# Patient Record
Sex: Male | Born: 1935 | Race: White | Hispanic: No | Marital: Married | Smoking: Former smoker
Health system: Southern US, Community
[De-identification: ages and names within clinical notes are randomized; demographics above are authoritative.]

## PROBLEM LIST (undated history)

## (undated) DIAGNOSIS — R319 Hematuria, unspecified: Secondary | ICD-10-CM

## (undated) DIAGNOSIS — C801 Malignant (primary) neoplasm, unspecified: Secondary | ICD-10-CM

## (undated) DIAGNOSIS — Z87898 Personal history of other specified conditions: Secondary | ICD-10-CM

## (undated) DIAGNOSIS — Z8719 Personal history of other diseases of the digestive system: Secondary | ICD-10-CM

## (undated) DIAGNOSIS — N281 Cyst of kidney, acquired: Secondary | ICD-10-CM

## (undated) DIAGNOSIS — R3915 Urgency of urination: Secondary | ICD-10-CM

## (undated) DIAGNOSIS — I480 Paroxysmal atrial fibrillation: Secondary | ICD-10-CM

## (undated) DIAGNOSIS — Z95818 Presence of other cardiac implants and grafts: Secondary | ICD-10-CM

## (undated) DIAGNOSIS — C649 Malignant neoplasm of unspecified kidney, except renal pelvis: Secondary | ICD-10-CM

## (undated) DIAGNOSIS — I639 Cerebral infarction, unspecified: Secondary | ICD-10-CM

## (undated) DIAGNOSIS — Z9889 Other specified postprocedural states: Secondary | ICD-10-CM

## (undated) DIAGNOSIS — G629 Polyneuropathy, unspecified: Secondary | ICD-10-CM

## (undated) DIAGNOSIS — F329 Major depressive disorder, single episode, unspecified: Secondary | ICD-10-CM

## (undated) DIAGNOSIS — E860 Dehydration: Secondary | ICD-10-CM

## (undated) DIAGNOSIS — K5730 Diverticulosis of large intestine without perforation or abscess without bleeding: Secondary | ICD-10-CM

## (undated) DIAGNOSIS — R351 Nocturia: Secondary | ICD-10-CM

## (undated) DIAGNOSIS — Z8673 Personal history of transient ischemic attack (TIA), and cerebral infarction without residual deficits: Secondary | ICD-10-CM

## (undated) DIAGNOSIS — I10 Essential (primary) hypertension: Secondary | ICD-10-CM

## (undated) DIAGNOSIS — N529 Male erectile dysfunction, unspecified: Secondary | ICD-10-CM

## (undated) DIAGNOSIS — Z974 Presence of external hearing-aid: Secondary | ICD-10-CM

## (undated) DIAGNOSIS — E785 Hyperlipidemia, unspecified: Secondary | ICD-10-CM

## (undated) DIAGNOSIS — Z8619 Personal history of other infectious and parasitic diseases: Secondary | ICD-10-CM

## (undated) DIAGNOSIS — F32A Depression, unspecified: Secondary | ICD-10-CM

## (undated) DIAGNOSIS — F419 Anxiety disorder, unspecified: Secondary | ICD-10-CM

## (undated) DIAGNOSIS — N2889 Other specified disorders of kidney and ureter: Secondary | ICD-10-CM

## (undated) HISTORY — DX: Hyperlipidemia, unspecified: E78.5

## (undated) HISTORY — PX: WRIST FRACTURE SURGERY: SHX121

## (undated) HISTORY — DX: Anxiety disorder, unspecified: F41.9

## (undated) HISTORY — PX: ESOPHAGOGASTRODUODENOSCOPY: SHX1529

## (undated) HISTORY — DX: Polyneuropathy, unspecified: G62.9

## (undated) HISTORY — DX: Cerebral infarction, unspecified: I63.9

## (undated) HISTORY — PX: APPENDECTOMY: SHX54

## (undated) HISTORY — DX: Male erectile dysfunction, unspecified: N52.9

## (undated) HISTORY — DX: Malignant neoplasm of unspecified kidney, except renal pelvis: C64.9

## (undated) HISTORY — DX: Essential (primary) hypertension: I10

## (undated) HISTORY — PX: TRANSTHORACIC ECHOCARDIOGRAM: SHX275

## (undated) HISTORY — DX: Depression, unspecified: F32.A

## (undated) HISTORY — DX: Major depressive disorder, single episode, unspecified: F32.9

## (undated) HISTORY — PX: COLONOSCOPY: SHX174

---
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
## 2001-09-01 ENCOUNTER — Encounter: Admission: RE | Admit: 2001-09-01 | Discharge: 2001-09-01 | Payer: Self-pay | Admitting: Internal Medicine

## 2001-09-01 ENCOUNTER — Encounter: Payer: Self-pay | Admitting: Internal Medicine

## 2004-03-09 ENCOUNTER — Ambulatory Visit: Payer: Self-pay | Admitting: Internal Medicine

## 2005-05-24 ENCOUNTER — Ambulatory Visit: Payer: Self-pay | Admitting: Internal Medicine

## 2005-06-03 ENCOUNTER — Ambulatory Visit: Payer: Self-pay | Admitting: Internal Medicine

## 2005-06-21 ENCOUNTER — Ambulatory Visit: Payer: Self-pay | Admitting: Internal Medicine

## 2005-07-05 ENCOUNTER — Ambulatory Visit: Payer: Self-pay | Admitting: Internal Medicine

## 2006-09-23 ENCOUNTER — Ambulatory Visit: Payer: Self-pay | Admitting: Internal Medicine

## 2006-09-23 LAB — CONVERTED CEMR LAB
ALT: 25 units/L (ref 0–40)
AST: 21 units/L (ref 0–37)
Albumin: 3.8 g/dL (ref 3.5–5.2)
Alkaline Phosphatase: 63 units/L (ref 39–117)
BUN: 14 mg/dL (ref 6–23)
Basophils Absolute: 0 10*3/uL (ref 0.0–0.1)
Basophils Relative: 0.5 % (ref 0.0–1.0)
Bilirubin Urine: NEGATIVE
Bilirubin, Direct: 0.1 mg/dL (ref 0.0–0.3)
CO2: 32 meq/L (ref 19–32)
Calcium: 9.2 mg/dL (ref 8.4–10.5)
Chloride: 106 meq/L (ref 96–112)
Cholesterol: 158 mg/dL (ref 0–200)
Creatinine, Ser: 1.1 mg/dL (ref 0.4–1.5)
Eosinophils Absolute: 0.1 10*3/uL (ref 0.0–0.6)
Eosinophils Relative: 1.6 % (ref 0.0–5.0)
GFR calc Af Amer: 86 mL/min
GFR calc non Af Amer: 71 mL/min
Glucose, Bld: 100 mg/dL — ABNORMAL HIGH (ref 70–99)
HCT: 44.3 % (ref 39.0–52.0)
HDL: 29.7 mg/dL — ABNORMAL LOW (ref >39.0)
Hemoglobin, Urine: NEGATIVE
Hemoglobin: 15.5 g/dL (ref 13.0–17.0)
Ketones, ur: NEGATIVE mg/dL
LDL Cholesterol: 115 mg/dL — ABNORMAL HIGH (ref 0–99)
Leukocytes, UA: NEGATIVE
Lymphocytes Relative: 20.8 % (ref 12.0–46.0)
MCHC: 35 g/dL (ref 30.0–36.0)
MCV: 95.1 fL (ref 78.0–100.0)
Monocytes Absolute: 0.5 10*3/uL (ref 0.2–0.7)
Monocytes Relative: 11.7 % — ABNORMAL HIGH (ref 3.0–11.0)
Neutro Abs: 2.8 10*3/uL (ref 1.4–7.7)
Neutrophils Relative %: 65.4 % (ref 43.0–77.0)
Nitrite: NEGATIVE
PSA: 0.56 ng/mL (ref 0.10–4.00)
Platelets: 144 10*3/uL — ABNORMAL LOW (ref 150–400)
Potassium: 4.8 meq/L (ref 3.5–5.1)
RBC: 4.66 M/uL (ref 4.22–5.81)
RDW: 12 % (ref 11.5–14.6)
Sodium: 142 meq/L (ref 135–145)
Specific Gravity, Urine: 1.02 (ref 1.000–1.03)
TSH: 1.13 microintl units/mL (ref 0.35–5.50)
Total Bilirubin: 0.6 mg/dL (ref 0.3–1.2)
Total CHOL/HDL Ratio: 5.3
Total Protein, Urine: NEGATIVE mg/dL
Total Protein: 6.3 g/dL (ref 6.0–8.3)
Triglycerides: 67 mg/dL (ref 0–149)
Urine Glucose: NEGATIVE mg/dL
Urobilinogen, UA: 0.2 (ref 0.0–1.0)
VLDL: 13 mg/dL (ref 0–40)
WBC: 4.3 10*3/uL — ABNORMAL LOW (ref 4.5–10.5)
pH: 7 (ref 5.0–8.0)

## 2008-05-12 ENCOUNTER — Ambulatory Visit: Payer: Self-pay | Admitting: Internal Medicine

## 2008-05-12 DIAGNOSIS — E785 Hyperlipidemia, unspecified: Secondary | ICD-10-CM | POA: Insufficient documentation

## 2008-05-12 DIAGNOSIS — I10 Essential (primary) hypertension: Secondary | ICD-10-CM | POA: Insufficient documentation

## 2008-05-12 DIAGNOSIS — K5730 Diverticulosis of large intestine without perforation or abscess without bleeding: Secondary | ICD-10-CM | POA: Insufficient documentation

## 2008-05-12 DIAGNOSIS — N318 Other neuromuscular dysfunction of bladder: Secondary | ICD-10-CM | POA: Insufficient documentation

## 2008-05-12 DIAGNOSIS — F329 Major depressive disorder, single episode, unspecified: Secondary | ICD-10-CM | POA: Insufficient documentation

## 2008-05-12 DIAGNOSIS — K219 Gastro-esophageal reflux disease without esophagitis: Secondary | ICD-10-CM | POA: Insufficient documentation

## 2008-05-12 DIAGNOSIS — F3289 Other specified depressive episodes: Secondary | ICD-10-CM | POA: Insufficient documentation

## 2008-05-12 DIAGNOSIS — K279 Peptic ulcer, site unspecified, unspecified as acute or chronic, without hemorrhage or perforation: Secondary | ICD-10-CM | POA: Insufficient documentation

## 2008-05-12 DIAGNOSIS — F411 Generalized anxiety disorder: Secondary | ICD-10-CM | POA: Insufficient documentation

## 2008-05-12 LAB — HM COLONOSCOPY

## 2010-05-06 LAB — CONVERTED CEMR LAB
ALT: 20 units/L (ref 0–53)
AST: 21 units/L (ref 0–37)
Albumin: 4.3 g/dL (ref 3.5–5.2)
Alkaline Phosphatase: 63 units/L (ref 39–117)
BUN: 19 mg/dL (ref 6–23)
Basophils Absolute: 0 10*3/uL (ref 0.0–0.1)
Basophils Relative: 0.3 % (ref 0.0–3.0)
Bilirubin Urine: NEGATIVE
Bilirubin, Direct: 0.2 mg/dL (ref 0.0–0.3)
CO2: 31 meq/L (ref 19–32)
Calcium: 9.3 mg/dL (ref 8.4–10.5)
Chloride: 106 meq/L (ref 96–112)
Cholesterol: 159 mg/dL (ref 0–200)
Creatinine, Ser: 1.1 mg/dL (ref 0.4–1.5)
Eosinophils Absolute: 0.1 10*3/uL (ref 0.0–0.7)
Eosinophils Relative: 1 % (ref 0.0–5.0)
GFR calc Af Amer: 85 mL/min
GFR calc non Af Amer: 70 mL/min
Glucose, Bld: 100 mg/dL — ABNORMAL HIGH (ref 70–99)
HCT: 46.1 % (ref 39.0–52.0)
HDL: 31.8 mg/dL — ABNORMAL LOW (ref >39.0)
Hemoglobin, Urine: NEGATIVE
Hemoglobin: 16.1 g/dL (ref 13.0–17.0)
Ketones, ur: NEGATIVE mg/dL
LDL Cholesterol: 116 mg/dL — ABNORMAL HIGH (ref 0–99)
Leukocytes, UA: NEGATIVE
Lymphocytes Relative: 17.4 % (ref 12.0–46.0)
MCHC: 34.9 g/dL (ref 30.0–36.0)
MCV: 97.4 fL (ref 78.0–100.0)
Monocytes Absolute: 0.4 10*3/uL (ref 0.1–1.0)
Monocytes Relative: 8.5 % (ref 3.0–12.0)
Neutro Abs: 3.8 10*3/uL (ref 1.4–7.7)
Neutrophils Relative %: 72.8 % (ref 43.0–77.0)
Nitrite: NEGATIVE
PSA: 0.5 ng/mL (ref 0.10–4.00)
Platelets: 139 10*3/uL — ABNORMAL LOW (ref 150–400)
Potassium: 4.6 meq/L (ref 3.5–5.1)
RBC: 4.74 M/uL (ref 4.22–5.81)
RDW: 12.5 % (ref 11.5–14.6)
Sodium: 143 meq/L (ref 135–145)
Specific Gravity, Urine: 1.025 (ref 1.000–1.03)
TSH: 1.21 microintl units/mL (ref 0.35–5.50)
Total Bilirubin: 0.9 mg/dL (ref 0.3–1.2)
Total CHOL/HDL Ratio: 5
Total Protein, Urine: NEGATIVE mg/dL
Total Protein: 6.6 g/dL (ref 6.0–8.3)
Triglycerides: 55 mg/dL (ref 0–149)
Urine Glucose: NEGATIVE mg/dL
Urobilinogen, UA: 0.2 (ref 0.0–1.0)
VLDL: 11 mg/dL (ref 0–40)
WBC: 5.2 10*3/uL (ref 4.5–10.5)
pH: 5.5 (ref 5.0–8.0)

## 2010-05-10 NOTE — Assessment & Plan Note (Signed)
Summary: YEARLY--$50---STC   Vital Signs:  Patient Profile:   75 Years Old Male Weight:      213 pounds O2 Sat:      97 % O2 treatment:    Room Air Temp:     97.7 degrees F oral Pulse rate:   65 / minute BP sitting:   144 / 82  (left arm) Cuff size:   regular  Vitals Entered By: Payton Spark CMA (May 12, 2008 8:26 AM)                 Preventive Care Screening  Colonoscopy:    Date:  07/05/2005    Next Due:  07/2015    Results:  Diverticulosis   Last Pneumovax:    Date:  03/15/2004    Results:  given    Chief Complaint:  yearly follow up.  History of Present Illness: overall doing well, BP at home < 140/90, no specific complaints    Prior Medications Reviewed Using: Patient Recall  Updated Prior Medication List: OMEPRAZOLE 20 MG CPDR (OMEPRAZOLE) 1 by mouth once daily as needed ADULT ASPIRIN EC LOW STRENGTH 81 MG TBEC (ASPIRIN) 1p o once daily VESICARE 5 MG TABS (SOLIFENACIN SUCCINATE) 1 by mouth once daily  Current Allergies (reviewed today): No known allergies   Past Medical History:    Reviewed history and no changes required:       Hypertension       E.D.       Hyperlipidemia       DJD - left knee        mitral regurgitation       Peptic ulcer disease with duodenal and antral ulcers/ hx of tx for h pylori       Anxiety       Depression       hx of syncope 1997       Diverticulosis, colon       GERD  Past Surgical History:    Reviewed history and no changes required:       Appendectomy   Family History:    Reviewed history and no changes required:       brother with parkinson's, dementia       mother and brother with pacemakers  Social History:    Reviewed history and no changes required:       Married       2 children       Never Smoked       Alcohol use-no       retired - [employer]       son is Programmer, systems   Risk Factors:  Tobacco use:  never Alcohol use:  no  Colonoscopy History:     Date of Last  Colonoscopy:  07/05/2005    Results:  Diverticulosis    Review of Systems  The patient denies anorexia, fever, weight loss, weight gain, vision loss, decreased hearing, hoarseness, chest pain, syncope, dyspnea on exertion, peripheral edema, prolonged cough, headaches, hemoptysis, abdominal pain, melena, hematochezia, severe indigestion/heartburn, hematuria, incontinence, muscle weakness, suspicious skin lesions, transient blindness, difficulty walking, depression, unusual weight change, abnormal bleeding, enlarged lymph nodes, angioedema, and breast masses.         all otherwise negative except for some daily urgency often times leading to incontinence, but no specific symtpom o/w of prostatism such as hesitancy   Physical Exam  General:     alert and well-developed.   Head:     Normocephalic  and atraumatic without obvious abnormalities. No apparent alopecia or balding. Eyes:     No corneal or conjunctival inflammation noted. EOMI. Perrla. Ears:     External ear exam shows no significant lesions or deformities.  Otoscopic examination reveals clear canals, tympanic membranes are intact bilaterally without bulging, retraction, inflammation or discharge. Hearing is grossly normal bilaterally. Nose:     External nasal examination shows no deformity or inflammation. Nasal mucosa are pink and moist without lesions or exudates. Mouth:     Oral mucosa and oropharynx without lesions or exudates.  Teeth in good repair. Neck:     No deformities, masses, or tenderness noted. Lungs:     Normal respiratory effort, chest expands symmetrically. Lungs are clear to auscultation, no crackles or wheezes. Heart:     Normal rate and regular rhythm. S1 and S2 normal without gallop, murmur, click, rub or other extra sounds. Abdomen:     Bowel sounds positive,abdomen soft and non-tender without masses, organomegaly or hernias noted. Msk:     no joint tenderness and no joint swelling.   Extremities:     no  edema, no ulcers  Neurologic:     cranial nerves II-XII intact and strength normal in all extremities.      Impression & Recommendations:  Problem # 1:  Preventive Health Care (ICD-V70.0) Overall doing well, up to date, counseled on routine health concerns for screening and prevention, immunizations up to date or declined, labs ordered, ecg reviewed    Orders: EKG w/ Interpretation (93000) TLB-BMP (Basic Metabolic Panel-BMET) (80048-METABOL) TLB-CBC Platelet - w/Differential (85025-CBCD) TLB-Hepatic/Liver Function Pnl (80076-HEPATIC) TLB-Lipid Panel (80061-LIPID) TLB-PSA (Prostate Specific Antigen) (84153-PSA) TLB-TSH (Thyroid Stimulating Hormone) (84443-TSH) TLB-Udip ONLY (81003-UDIP)   Problem # 2:  OVERACTIVE BLADDER (ICD-596.51) to try the vesicare, check ua and psa with labs above, to Urology if not improved in 4 wks  Complete Medication List: 1)  Omeprazole 20 Mg Cpdr (Omeprazole) .[name].. 1 by mouth once daily as needed 2)  Adult Aspirin Ec Low Strength 81 Mg Tbec (Aspirin) .[name].. 1p o once daily 3)  Vesicare 5 Mg Tabs (Solifenacin succinate) .[name].. 1 by mouth once daily  Other Orders: Tdap => 25yrs IM (16109) Pneumococcal Vaccine (60454) Admin 1st Vaccine (09811) Admin of Any Addtl Vaccine (91478)   Patient Instructions: 1)  you received the tetanus and pneumonia shots today 2)  Please go to the Lab in the basement for your blood tests today 3)  start the vesicare 5 mg per day 4)  Continue all medications that you may have been taking previously  5)  please call in 4 wks if the urinary problem is not improved for a referral to urology 6)  Please schedule a follow-up appointment in 1 year or sooner if needed   Prescriptions: VESICARE 5 MG TABS (SOLIFENACIN SUCCINATE) 1 by mouth once daily  #90 x 3   Entered and Authorized by:   Corwin Levins MD   Signed by:   Corwin Levins MD on 05/12/2008   Method used:   Print then Give to Patient   RxID:   [id number]     Other Immunization History:    Zostavax # 1:  Zostavax (09/23/2006)     Tetanus/Td Vaccine    Vaccine Type: Tdap    Site: right deltoid    Mfr: GlaxoSmithKline    Dose: 0.5 ml    Route: IM    Given by: Payton Spark CMA    Exp. Date: 03/12/2010    Lot #:  [id number]    VIS given: 02/24/07 version given May 12, 2008.  Pneumovax Vaccine    Vaccine Type: Pneumovax    Site: right deltoid    Mfr: Merck    Dose: 0.5 ml    Route: IM    Given by: Payton Spark CMA    Exp. Date: 05/06/2009    Lot #: [id number]    VIS given: 11/04/95 version given May 12, 2008.

## 2010-07-27 ENCOUNTER — Ambulatory Visit (INDEPENDENT_AMBULATORY_CARE_PROVIDER_SITE_OTHER): Payer: Medicare Other | Admitting: Internal Medicine

## 2010-07-27 ENCOUNTER — Other Ambulatory Visit (INDEPENDENT_AMBULATORY_CARE_PROVIDER_SITE_OTHER): Payer: Medicare Other

## 2010-07-27 ENCOUNTER — Encounter: Payer: Self-pay | Admitting: Internal Medicine

## 2010-07-27 VITALS — BP 122/78 | HR 53 | Temp 97.8°F | Ht 72.0 in | Wt 207.1 lb

## 2010-07-27 DIAGNOSIS — Z0000 Encounter for general adult medical examination without abnormal findings: Secondary | ICD-10-CM

## 2010-07-27 DIAGNOSIS — E785 Hyperlipidemia, unspecified: Secondary | ICD-10-CM

## 2010-07-27 DIAGNOSIS — R42 Dizziness and giddiness: Secondary | ICD-10-CM | POA: Insufficient documentation

## 2010-07-27 DIAGNOSIS — Z125 Encounter for screening for malignant neoplasm of prostate: Secondary | ICD-10-CM

## 2010-07-27 DIAGNOSIS — I10 Essential (primary) hypertension: Secondary | ICD-10-CM

## 2010-07-27 LAB — URINALYSIS, ROUTINE W REFLEX MICROSCOPIC
Bilirubin Urine: NEGATIVE
Hgb urine dipstick: NEGATIVE
Ketones, ur: NEGATIVE
Leukocytes, UA: NEGATIVE
Nitrite: NEGATIVE
Specific Gravity, Urine: 1.02 (ref 1.000–1.030)
Total Protein, Urine: NEGATIVE
Urine Glucose: NEGATIVE
Urobilinogen, UA: 0.2 (ref 0.0–1.0)
pH: 5.5 (ref 5.0–8.0)

## 2010-07-27 LAB — BASIC METABOLIC PANEL
BUN: 20 mg/dL (ref 6–23)
CO2: 29 mEq/L (ref 19–32)
Calcium: 9.2 mg/dL (ref 8.4–10.5)
Chloride: 103 mEq/L (ref 96–112)
Creatinine, Ser: 1 mg/dL (ref 0.4–1.5)
GFR: 74.53 mL/min (ref >60.00)
Glucose, Bld: 79 mg/dL (ref 70–99)
Potassium: 4 mEq/L (ref 3.5–5.1)
Sodium: 140 mEq/L (ref 135–145)

## 2010-07-27 LAB — CBC WITH DIFFERENTIAL/PLATELET
Basophils Absolute: 0 10*3/uL (ref 0.0–0.1)
Basophils Relative: 0.5 % (ref 0.0–3.0)
Eosinophils Absolute: 0.1 10*3/uL (ref 0.0–0.7)
Eosinophils Relative: 1.4 % (ref 0.0–5.0)
HCT: 44.2 % (ref 39.0–52.0)
Hemoglobin: 15.3 g/dL (ref 13.0–17.0)
Lymphocytes Relative: 22.4 % (ref 12.0–46.0)
Lymphs Abs: 1 10*3/uL (ref 0.7–4.0)
MCHC: 34.8 g/dL (ref 30.0–36.0)
MCV: 98.3 fl (ref 78.0–100.0)
Monocytes Absolute: 0.6 10*3/uL (ref 0.1–1.0)
Monocytes Relative: 13.3 % — ABNORMAL HIGH (ref 3.0–12.0)
Neutro Abs: 2.7 10*3/uL (ref 1.4–7.7)
Neutrophils Relative %: 62.4 % (ref 43.0–77.0)
Platelets: 143 10*3/uL — ABNORMAL LOW (ref 150.0–400.0)
RBC: 4.49 Mil/uL (ref 4.22–5.81)
RDW: 12.7 % (ref 11.5–14.6)
WBC: 4.3 10*3/uL — ABNORMAL LOW (ref 4.5–10.5)

## 2010-07-27 LAB — HEPATIC FUNCTION PANEL
ALT: 22 U/L (ref 0–53)
AST: 23 U/L (ref 0–37)
Albumin: 4.2 g/dL (ref 3.5–5.2)
Alkaline Phosphatase: 59 U/L (ref 39–117)
Bilirubin, Direct: 0.1 mg/dL (ref 0.0–0.3)
Total Bilirubin: 1 mg/dL (ref 0.3–1.2)
Total Protein: 6.4 g/dL (ref 6.0–8.3)

## 2010-07-27 LAB — LIPID PANEL
Cholesterol: 154 mg/dL (ref 0–200)
HDL: 36.9 mg/dL — ABNORMAL LOW (ref >39.00)
LDL Cholesterol: 107 mg/dL — ABNORMAL HIGH (ref 0–99)
Total CHOL/HDL Ratio: 4
Triglycerides: 50 mg/dL (ref 0.0–149.0)
VLDL: 10 mg/dL (ref 0.0–40.0)

## 2010-07-27 LAB — PSA: PSA: 0.49 ng/mL (ref 0.10–4.00)

## 2010-07-27 LAB — TSH: TSH: 1.44 u[IU]/mL (ref 0.35–5.50)

## 2010-07-27 NOTE — Progress Notes (Signed)
Quick Note:  Voice message left on PhoneTree system - lab is negative, normal or otherwise stable, pt to continue same tx ______ 

## 2010-07-27 NOTE — Assessment & Plan Note (Signed)
stable overall by hx and exam, most recent lab reviewed with pt, and pt to continue medical treatment as before  BP Readings from Last 3 Encounters:  07/27/10 122/78  05/12/08 144/82

## 2010-07-27 NOTE — Progress Notes (Signed)
Here for wellness and f/u;  Overall doing ok;  Pt denies CP, worsening SOB, DOE, wheezing, orthopnea, PND, worsening LE edema, palpitations, dizziness or syncope.  Pt denies neurological change such as new Headache, facial or extremity weakness.  Pt denies polydipsia, polyuria, or low sugar symptoms. Pt states overall good compliance with treatment and medications, good tolerability, and trying to follow lower cholesterol diet.  Pt denies worsening depressive symptoms, suicidal ideation or panic. No fever, wt loss, night sweats, loss of appetite, or other constitutional symptoms.  Pt states good ability with ADL's, low fall risk, home safety reviewed and adequate, no significant changes in hearing or vision, and occasionally active with exercise.  Has not been here for 2 yrs, and now involvd in the "aspree" trial at [facility], and meds changed from last visit, now on no meds except for the study med, which may or may not have asa in it. Does ask for celebrex for joint aches prn, no recent swelling.  Past Medical History  Diagnosis Date  . Hypertension   . Erectile dysfunction   . Hyperlipidemia   . Anxiety   . Depression   . GERD (gastroesophageal reflux disease)   . Diverticula, colon   . Syncope     History  . Mitral regurgitation   . Peptic ulcer disease     with Duodenal and Antral ulcers  . H. pylori infection   . HYPERLIPIDEMIA 05/12/2008  . ANXIETY 05/12/2008  . DEPRESSION 05/12/2008  . HYPERTENSION 05/12/2008  . GERD 05/12/2008  . PEPTIC ULCER DISEASE 05/12/2008  . DIVERTICULOSIS, COLON 05/12/2008  . OVERACTIVE BLADDER 05/12/2008   Past Surgical History  Procedure Date  . Appendectomy     reports that he has never smoked. He does not have any smokeless tobacco history on file. He reports that he does not drink alcohol or use illicit drugs. family history includes Dementia in his brother and Parkinsonism in his brother. No Known Allergies Current Outpatient Prescriptions on File Prior to Visit    Medication Sig Dispense Refill  . omeprazole (PRILOSEC) 20 MG capsule Take 20 mg by mouth daily as needed.        [name] aspirin 81 MG EC tablet Take 81 mg by mouth daily.        . solifenacin (VESICARE) 5 MG tablet Take 10 mg by mouth daily.         Review of Systems  Constitutional: Negative for diaphoresis, activity change, appetite change and unexpected weight change.  HENT: Negative for hearing loss, ear pain, facial swelling, mouth sores and neck stiffness.   Eyes: Negative for pain, redness and visual disturbance.  Respiratory: Negative for shortness of breath and wheezing.   Cardiovascular: Negative for chest pain and palpitations.  Gastrointestinal: Negative for diarrhea, blood in stool, abdominal distention and rectal pain.  Genitourinary: Negative for hematuria, flank pain and decreased urine volume.  Musculoskeletal: Negative for myalgias and joint swelling.  Skin: Negative for color change and wound.  Neurological: Negative for syncope and numbness.  Hematological: Negative for adenopathy.  Psychiatric/Behavioral: Negative for hallucinations, self-injury, decreased concentration and agitation.  Did have one dizzy spell last wk but few seconds only and he thinks related to a URI he was trying to get over.  BP 122/78  Pulse 53  Temp(Src) 97.8 F (36.6 C) (Oral)  Ht 6' (1.829 m)  Wt 207 lb 2 oz (93.951 kg)  BMI 28.09 kg/m2  SpO2 98% Physical Exam  VS noted Constitutional: Pt is oriented to  person, place, and time. Appears well-developed and well-nourished.  HENT:  Head: Normocephalic and atraumatic.  Right Ear: External ear normal.  Left Ear: External ear normal.  Nose: Nose normal.  Mouth/Throat: Oropharynx is clear and moist.  Eyes: Conjunctivae and EOM are normal. Pupils are equal, round, and reactive to light.  Neck: Normal range of motion. Neck supple. No JVD present. No tracheal deviation present.  Cardiovascular: Normal rate, regular rhythm, normal heart sounds and  intact distal pulses.   Pulmonary/Chest: Effort normal and breath sounds normal.  Abdominal: Soft. Bowel sounds are normal. There is no tenderness.  Musculoskeletal: Normal range of motion. Exhibits no edema.  Lymphadenopathy:  Has no cervical adenopathy.  Neurological: Pt is alert and oriented to person, place, and time. Pt has normal reflexes. No cranial nerve deficit.  Skin: Skin is warm and dry. No rash noted.  Psychiatric:  Has  normal mood and affect. Behavior is normal.

## 2010-07-27 NOTE — Assessment & Plan Note (Signed)

## 2010-07-27 NOTE — Patient Instructions (Signed)
Take all new medications as prescribed Continue all other medications as before Please go to LAB in the Basement for the blood and/or urine tests to be done today You will be contacted regarding the referral for: echocardiogram, carotid dopplers Please call the number on the Blue Card (the PhoneTree System) for results of testing in 2-3 days Please return in 1 year for your yearly visit, or sooner if needed, with Lab testing done 3-5 days before

## 2010-07-27 NOTE — Assessment & Plan Note (Addendum)
Unclear etiology, exam unrevealing, ecg reviewed - sinus, no acute, for echo and carotids,  to f/u any worsening symptoms or concerns

## 2010-08-06 ENCOUNTER — Other Ambulatory Visit (HOSPITAL_COMMUNITY): Payer: Medicare Other | Admitting: Radiology

## 2010-08-07 ENCOUNTER — Encounter (INDEPENDENT_AMBULATORY_CARE_PROVIDER_SITE_OTHER): Payer: Medicare Other | Admitting: *Deleted

## 2010-08-07 ENCOUNTER — Ambulatory Visit (HOSPITAL_COMMUNITY): Payer: Medicare Other | Attending: Internal Medicine | Admitting: Radiology

## 2010-08-07 ENCOUNTER — Other Ambulatory Visit: Payer: Self-pay | Admitting: Internal Medicine

## 2010-08-07 DIAGNOSIS — R42 Dizziness and giddiness: Secondary | ICD-10-CM

## 2010-08-07 DIAGNOSIS — I6529 Occlusion and stenosis of unspecified carotid artery: Secondary | ICD-10-CM

## 2010-08-07 DIAGNOSIS — R55 Syncope and collapse: Secondary | ICD-10-CM

## 2010-08-07 DIAGNOSIS — I059 Rheumatic mitral valve disease, unspecified: Secondary | ICD-10-CM

## 2010-08-14 ENCOUNTER — Encounter: Payer: Self-pay | Admitting: Internal Medicine

## 2010-08-24 ENCOUNTER — Encounter: Payer: Self-pay | Admitting: Internal Medicine

## 2010-08-24 ENCOUNTER — Other Ambulatory Visit: Payer: Self-pay | Admitting: Internal Medicine

## 2010-08-24 DIAGNOSIS — M1712 Unilateral primary osteoarthritis, left knee: Secondary | ICD-10-CM | POA: Insufficient documentation

## 2010-08-24 MED ORDER — CELECOXIB 200 MG PO CAPS: 200.0000 mg | ORAL_CAPSULE | Freq: Two times a day (BID) | ORAL | Status: AC

## 2010-08-24 NOTE — Telephone Encounter (Signed)
[name] is at the pharmacy apparently expecting a Rx for Celebrex.? Stating that the pharmacy is faxing over requests. I can see where this has been denied and is not on Pt's med list.?? Not sure how you want to handle this.

## 2010-08-24 NOTE — Telephone Encounter (Signed)
Done per emr for left knee djd

## 2010-08-24 NOTE — Telephone Encounter (Signed)
LMOM to inform Pt that Rx was sent in to [facility].

## 2011-12-16 ENCOUNTER — Inpatient Hospital Stay (HOSPITAL_COMMUNITY): Payer: Medicare Other

## 2011-12-16 ENCOUNTER — Inpatient Hospital Stay (HOSPITAL_BASED_OUTPATIENT_CLINIC_OR_DEPARTMENT_OTHER)
Admission: EM | Admit: 2011-12-16 | Discharge: 2011-12-19 | DRG: 065 | Disposition: A | Discharge status: Home Health | Payer: Medicare Other | Attending: Internal Medicine | Admitting: Internal Medicine

## 2011-12-16 ENCOUNTER — Telehealth: Payer: Self-pay | Admitting: Physician Assistant

## 2011-12-16 ENCOUNTER — Emergency Department (HOSPITAL_BASED_OUTPATIENT_CLINIC_OR_DEPARTMENT_OTHER): Payer: Medicare Other

## 2011-12-16 ENCOUNTER — Encounter (HOSPITAL_BASED_OUTPATIENT_CLINIC_OR_DEPARTMENT_OTHER): Payer: Self-pay | Admitting: *Deleted

## 2011-12-16 DIAGNOSIS — K219 Gastro-esophageal reflux disease without esophagitis: Secondary | ICD-10-CM | POA: Diagnosis present

## 2011-12-16 DIAGNOSIS — F3289 Other specified depressive episodes: Secondary | ICD-10-CM | POA: Diagnosis present

## 2011-12-16 DIAGNOSIS — F329 Major depressive disorder, single episode, unspecified: Secondary | ICD-10-CM | POA: Diagnosis present

## 2011-12-16 DIAGNOSIS — Z8673 Personal history of transient ischemic attack (TIA), and cerebral infarction without residual deficits: Secondary | ICD-10-CM | POA: Diagnosis present

## 2011-12-16 DIAGNOSIS — R030 Elevated blood-pressure reading, without diagnosis of hypertension: Secondary | ICD-10-CM | POA: Diagnosis present

## 2011-12-16 DIAGNOSIS — I059 Rheumatic mitral valve disease, unspecified: Secondary | ICD-10-CM | POA: Diagnosis present

## 2011-12-16 DIAGNOSIS — I639 Cerebral infarction, unspecified: Secondary | ICD-10-CM

## 2011-12-16 DIAGNOSIS — K7689 Other specified diseases of liver: Secondary | ICD-10-CM | POA: Diagnosis present

## 2011-12-16 DIAGNOSIS — F411 Generalized anxiety disorder: Secondary | ICD-10-CM | POA: Diagnosis present

## 2011-12-16 DIAGNOSIS — E785 Hyperlipidemia, unspecified: Secondary | ICD-10-CM | POA: Diagnosis present

## 2011-12-16 DIAGNOSIS — I6350 Cerebral infarction due to unspecified occlusion or stenosis of unspecified cerebral artery: Secondary | ICD-10-CM

## 2011-12-16 DIAGNOSIS — I6340 Cerebral infarction due to embolism of unspecified cerebral artery: Principal | ICD-10-CM | POA: Diagnosis present

## 2011-12-16 DIAGNOSIS — G8190 Hemiplegia, unspecified affecting unspecified side: Secondary | ICD-10-CM | POA: Diagnosis present

## 2011-12-16 LAB — COMPREHENSIVE METABOLIC PANEL
ALT: 18 U/L (ref 0–53)
AST: 19 U/L (ref 0–37)
Albumin: 4.2 g/dL (ref 3.5–5.2)
Alkaline Phosphatase: 63 U/L (ref 39–117)
BUN: 20 mg/dL (ref 6–23)
CO2: 29 mEq/L (ref 19–32)
Calcium: 9.6 mg/dL (ref 8.4–10.5)
Chloride: 101 mEq/L (ref 96–112)
Creatinine, Ser: 1.2 mg/dL (ref 0.50–1.35)
GFR calc Af Amer: 67 mL/min — ABNORMAL LOW (ref >90)
GFR calc non Af Amer: 58 mL/min — ABNORMAL LOW (ref >90)
Glucose, Bld: 99 mg/dL (ref 70–99)
Potassium: 4.3 mEq/L (ref 3.5–5.1)
Sodium: 139 mEq/L (ref 135–145)
Total Bilirubin: 0.5 mg/dL (ref 0.3–1.2)
Total Protein: 6.7 g/dL (ref 6.0–8.3)

## 2011-12-16 LAB — RAPID URINE DRUG SCREEN, HOSP PERFORMED
Amphetamines: NOT DETECTED
Barbiturates: NOT DETECTED
Benzodiazepines: NOT DETECTED
Cocaine: NOT DETECTED
Opiates: NOT DETECTED
Tetrahydrocannabinol: NOT DETECTED

## 2011-12-16 LAB — DIFFERENTIAL
Basophils Absolute: 0 10*3/uL (ref 0.0–0.1)
Basophils Relative: 0 % (ref 0–1)
Eosinophils Absolute: 0.1 10*3/uL (ref 0.0–0.7)
Eosinophils Relative: 1 % (ref 0–5)
Lymphocytes Relative: 22 % (ref 12–46)
Lymphs Abs: 1.1 10*3/uL (ref 0.7–4.0)
Monocytes Absolute: 0.7 10*3/uL (ref 0.1–1.0)
Monocytes Relative: 14 % — ABNORMAL HIGH (ref 3–12)
Neutro Abs: 3.1 10*3/uL (ref 1.7–7.7)
Neutrophils Relative %: 63 % (ref 43–77)

## 2011-12-16 LAB — CREATININE, SERUM
Creatinine, Ser: 1.11 mg/dL (ref 0.50–1.35)
GFR calc Af Amer: 74 mL/min — ABNORMAL LOW (ref >90)
GFR calc non Af Amer: 64 mL/min — ABNORMAL LOW (ref >90)

## 2011-12-16 LAB — CBC
HCT: 41.6 % (ref 39.0–52.0)
HCT: 43.7 % (ref 39.0–52.0)
Hemoglobin: 14.5 g/dL (ref 13.0–17.0)
Hemoglobin: 15.1 g/dL (ref 13.0–17.0)
MCH: 32.7 pg (ref 26.0–34.0)
MCH: 32.8 pg (ref 26.0–34.0)
MCHC: 34.9 g/dL (ref 30.0–36.0)
MCHC: 34.6 g/dL (ref 30.0–36.0)
MCV: 93.9 fL (ref 78.0–100.0)
MCV: 94.8 fL (ref 78.0–100.0)
Platelets: 134 10*3/uL — ABNORMAL LOW (ref 150–400)
Platelets: 138 10*3/uL — ABNORMAL LOW (ref 150–400)
RBC: 4.43 MIL/uL (ref 4.22–5.81)
RBC: 4.61 MIL/uL (ref 4.22–5.81)
RDW: 12.2 % (ref 11.5–15.5)
RDW: 12.5 % (ref 11.5–15.5)
WBC: 5 10*3/uL (ref 4.0–10.5)
WBC: 5.8 10*3/uL (ref 4.0–10.5)

## 2011-12-16 LAB — TROPONIN I: Troponin I: <0.3 ng/mL (ref <0.30)

## 2011-12-16 LAB — GLUCOSE, CAPILLARY: Glucose-Capillary: 106 mg/dL — ABNORMAL HIGH (ref 70–99)

## 2011-12-16 LAB — CK TOTAL AND CKMB (NOT AT ARMC)
CK, MB: 3.3 ng/mL (ref 0.3–4.0)
Relative Index: INVALID (ref 0.0–2.5)
Total CK: 87 U/L (ref 7–232)

## 2011-12-16 LAB — PROTIME-INR
INR: 0.93 (ref 0.00–1.49)
Prothrombin Time: 12.7 seconds (ref 11.6–15.2)

## 2011-12-16 LAB — APTT: aPTT: 31 seconds (ref 24–37)

## 2011-12-16 MED ORDER — ASPIRIN 300 MG RE SUPP
300.0000 mg | Freq: Every day | RECTAL | Status: DC
  Filled 2011-12-16 (×3): qty 1

## 2011-12-16 MED ORDER — SODIUM CHLORIDE 0.9 % IJ SOLN
3.0000 mL | Freq: Two times a day (BID) | INTRAMUSCULAR | Status: DC
  Administered 2011-12-16 – 2011-12-19 (×6): 3 mL via INTRAVENOUS

## 2011-12-16 MED ORDER — ENOXAPARIN SODIUM 40 MG/0.4ML ~~LOC~~ SOLN
40.0000 mg | Freq: Every day | SUBCUTANEOUS | Status: DC
  Administered 2011-12-16 – 2011-12-18 (×3): 40 mg via SUBCUTANEOUS
  Filled 2011-12-16 (×4): qty 0.4

## 2011-12-16 MED ORDER — ACETAMINOPHEN 325 MG PO TABS: 650.0000 mg | ORAL_TABLET | ORAL | Status: DC | PRN

## 2011-12-16 MED ORDER — SODIUM CHLORIDE 0.9 % IV SOLN
250.0000 mL | INTRAVENOUS | Status: DC | PRN
  Administered 2011-12-19: 500 mL via INTRAVENOUS

## 2011-12-16 MED ORDER — ASPIRIN 325 MG PO TABS
325.0000 mg | ORAL_TABLET | Freq: Every day | ORAL | Status: DC
  Administered 2011-12-16 – 2011-12-17 (×2): 325 mg via ORAL
  Filled 2011-12-16 (×3): qty 1

## 2011-12-16 MED ORDER — ACETAMINOPHEN 650 MG RE SUPP: 650.0000 mg | RECTAL | Status: DC | PRN

## 2011-12-16 MED ORDER — SENNOSIDES-DOCUSATE SODIUM 8.6-50 MG PO TABS
1.0000 | ORAL_TABLET | Freq: Every evening | ORAL | Status: DC | PRN
  Administered 2011-12-18: 1 via ORAL
  Filled 2011-12-16: qty 1

## 2011-12-16 MED ORDER — PANTOPRAZOLE SODIUM 40 MG PO TBEC
40.0000 mg | DELAYED_RELEASE_TABLET | Freq: Every day | ORAL | Status: DC
  Administered 2011-12-17 – 2011-12-19 (×3): 40 mg via ORAL
  Filled 2011-12-16 (×3): qty 1

## 2011-12-16 MED ORDER — SODIUM CHLORIDE 0.9 % IJ SOLN: 3.0000 mL | INTRAMUSCULAR | Status: DC | PRN

## 2011-12-16 NOTE — ED Provider Notes (Signed)
History     CSN: [id number]  Arrival date & time 12/16/11  0900   First MD Initiated Contact with Patient 12/16/11 0940      Chief Complaint  Patient presents with  . Extremity Weakness    (Consider location/radiation/quality/duration/timing/severity/associated sxs/prior treatment) HPI Patient complaining of weakness and numbness in his left lower extremity. He was in his normal state of health when he awoke at 06 30. The symptoms began about 7 AM. He has had difficulty walking. He denies any headache, changes in vision, changes in speech or ability to talk. He has not had similar symptoms in the past. He arrived here via private vehicle. His wife is present with him. He has a history of hypertension and high cholesterol. Past Medical History  Diagnosis Date  . Hypertension   . Erectile dysfunction   . Hyperlipidemia   . Anxiety   . Depression   . GERD (gastroesophageal reflux disease)   . Diverticula, colon   . Syncope     History  . Mitral regurgitation   . Peptic ulcer disease     with Duodenal and Antral ulcers  . H. pylori infection   . HYPERLIPIDEMIA 05/12/2008  . ANXIETY 05/12/2008  . DEPRESSION 05/12/2008  . HYPERTENSION 05/12/2008  . GERD 05/12/2008  . PEPTIC ULCER DISEASE 05/12/2008  . DIVERTICULOSIS, COLON 05/12/2008  . OVERACTIVE BLADDER 05/12/2008    Past Surgical History  Procedure Date  . Appendectomy     Family History  Problem Relation Age of Onset  . Parkinsonism Brother   . Dementia Brother     History  Substance Use Topics  . Smoking status: Never Smoker   . Smokeless tobacco: Not on file  . Alcohol Use: No      Review of Systems  Constitutional: Negative for fever and chills.  HENT: Negative for neck stiffness.   Eyes: Negative for visual disturbance.  Respiratory: Negative for shortness of breath.   Cardiovascular: Negative for chest pain.  Gastrointestinal: Negative for vomiting, diarrhea and blood in stool.  Genitourinary: Negative for  dysuria, frequency and decreased urine volume.  Musculoskeletal: Negative for myalgias and joint swelling.  Skin: Negative for rash.  Neurological: Positive for weakness and numbness.  Hematological: Negative for adenopathy.  Psychiatric/Behavioral: Negative for agitation.    Allergies  Review of patient's allergies indicates no known allergies.  Home Medications   Current Outpatient Rx  Name Route Sig Dispense Refill  . ASPIRIN 81 MG PO TBEC Oral Take 81 mg by mouth daily.      [name] OMEPRAZOLE 20 MG PO CPDR Oral Take 20 mg by mouth daily as needed.        BP 146/98  Pulse 64  Temp 98.1 F (36.7 C) (Oral)  Resp 18  Ht 6\' 2"  (1.88 m)  Wt 215 lb (97.523 kg)  BMI 27.60 kg/m2  SpO2 100%  Physical Exam  Nursing note and vitals reviewed. Constitutional: He is oriented to person, place, and time. He appears well-developed and well-nourished.  HENT:  Head: Normocephalic and atraumatic.  Right Ear: External ear normal.  Left Ear: External ear normal.  Nose: Nose normal.  Mouth/Throat: Oropharynx is clear and moist.  Eyes: Conjunctivae and EOM are normal. Pupils are equal, round, and reactive to light.  Neck: Normal range of motion. Neck supple.  Cardiovascular: Normal rate, regular rhythm, normal heart sounds and intact distal pulses.   Pulmonary/Chest: Effort normal and breath sounds normal. No respiratory distress. He has no wheezes. He exhibits no  tenderness.  Abdominal: Soft. Bowel sounds are normal. He exhibits no distension and no mass. There is no tenderness. There is no guarding.  Musculoskeletal: Normal range of motion.  Neurological: He is alert and oriented to person, place, and time. He has normal strength. He exhibits normal muscle tone. Coordination abnormal. GCS eye subscore is 4. GCS verbal subscore is 5. GCS motor subscore is 6.       Left lower extremity- hip flexors 4/5, knee extensors, 5/5, knee flexor 5/5  Patient unable to sit up without assistance and finger  to nose unable.  Gait not tested due to balance problem.   Skin: Skin is warm and dry.  Psychiatric: He has a normal mood and affect. His behavior is normal. Judgment and thought content normal.    ED Course  Procedures (including critical care time)   Labs Reviewed  PROTIME-INR  APTT  CBC  DIFFERENTIAL  COMPREHENSIVE METABOLIC PANEL  CK TOTAL AND CKMB  TROPONIN I  URINE RAPID DRUG SCREEN (HOSP PERFORMED)   No results found.   No diagnosis found.  Ct Head Wo Contrast  12/16/2011  *RADIOLOGY REPORT*  Clinical Data: Left leg weakness.  Left leg and foot numbness.  CT HEAD WITHOUT CONTRAST  Technique:  Contiguous axial images were obtained from the base of the skull through the vertex without contrast.  Comparison: None.  Findings: The brain shows generalized atrophy.  There is extensive chronic small vessel disease throughout the brain.  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  There are old small vessel changes affecting the pons.  The cerebral hemispheres show confluent chronic small vessel disease throughout the white matter.  Old small vessel insults effect the thalami and basal ganglia.  No old or acute cortical or large vessel territory infarction is evident.  With respect to the white matter disease, an acute or subacute small vessel infarction could be hidden amongst the extensive chronic changes.  No evidence of mass lesion, hemorrhage, hydrocephalus or extra-axial collection.  No calvarial abnormality.  No inflammatory sinus disease.  There is atherosclerotic calcification of the major vessels at the base of the brain.  IMPRESSION: No identifiably acute finding.  No intracranial hemorrhage. Advanced chronic atrophy and small vessel disease throughout the brain.   Original Report Authenticated By: Thomasenia Sales, M.D.    Date: 12/16/2011  Rate: 63  Rhythm: normal sinus rhythm  QRS Axis: normal  Intervals: normal  ST/T Wave abnormalities: normal   Conduction Disutrbances: none  Narrative Interpretation: unremarkable      MDM    Code stroke initiated and Dr. Petra Kuba responded. NIH scale is 2 and not felt to be candidate for TPA and code stroke canceled per Dr. Toma Copier. He states that he will see the patient in consultation on transfer to [facility]. We have requested that the hospitalist be paged for admission for the patient appear   CRITICAL CARE Performed by: Glenette Bookwalter S   Total critical care time: 45 Critical care time was exclusive of separately billable procedures and treating other patients.  Critical care was necessary to treat or prevent imminent or life-threatening deterioration.  Critical care was time spent personally by me on the following activities: development of treatment plan with patient and/or surrogate as well as nursing, discussions with consultants, evaluation of patient's response to treatment, examination of patient, obtaining history from patient or surrogate, ordering and performing treatments and interventions, ordering and review of laboratory studies, ordering and review of radiographic studies, pulse  oximetry and re-evaluation of patient's condition.   Hilario Quarry, MD 12/18/11 1714

## 2011-12-16 NOTE — ED Notes (Signed)
Called 911 and carellink

## 2011-12-16 NOTE — H&P (Signed)
Hospital Admission Note Date: 12/16/2011  Patient name: Jeffrey Hardy Medical record number: [id number] Date of birth: [birth year] Age: 76 y.o. Gender: male PCP: Oliver Barre, MD  Attending physician: Altha Harm, MD  Chief Complaint: Pain limited weakness in left lower extremity this morning  History of Present Illness: This is a very pleasant 76 year old gentleman who has no significant past medical history who presented to the emergency room and he will this morning and had a feeling of heaviness and numbness in the left lower extremity. Patient states that he is also unable to move his leg up until the time that he got to the emergency room. At this time he states that in the supine position his leg feels almost back to normal however he has not tested his condition in a standing position. He denies any other symptoms including chest pain, dizziness, shortness of breath, cough, nausea, vomiting,, diarrhea, fever or chills.  Of note the patient is a 3 year interval he of Aspree study which is a double blinded study using placebo and aspirin 100mg . the study and a severe has been notified of the need for appropriate antiplatelet therapy given the patient's clinical presentation.  Scheduled Meds:   Continuous Infusions:   PRN Meds:.   Allergies: Review of patient's allergies indicates no known allergies. Past Medical History  Diagnosis Date  . Hypertension   . Erectile dysfunction   . Hyperlipidemia   . Anxiety   . Depression   . GERD (gastroesophageal reflux disease)   . Diverticula, colon   . Syncope     History  . Mitral regurgitation   . Peptic ulcer disease     with Duodenal and Antral ulcers  . H. pylori infection   . HYPERLIPIDEMIA 05/12/2008  . ANXIETY 05/12/2008  . DEPRESSION 05/12/2008  . HYPERTENSION 05/12/2008  . GERD 05/12/2008  . PEPTIC ULCER DISEASE 05/12/2008  . DIVERTICULOSIS, COLON 05/12/2008  . OVERACTIVE BLADDER 05/12/2008   Past Surgical History  Procedure  Date  . Appendectomy    Family History  Problem Relation Age of Onset  . Parkinsonism Brother   . Dementia Brother    History   Social History  . Marital Status: Single    Spouse Name: N/A    Number of Children: N/A  . Years of Education: N/A   Occupational History  . Not on file.   Social History Main Topics  . Smoking status: Never Smoker   . Smokeless tobacco: Not on file  . Alcohol Use: No  . Drug Use: No  . Sexually Active:    Other Topics Concern  . Not on file   Social History Narrative   Pt's son is an Teacher, English as a foreign language. Patient is retired from [employer]   Review of Systems: A comprehensive review of systems was negative except as noted in the history of present illness. Physical Exam: No intake or output data in the 24 hours ending 12/16/11 1430 General: Alert, awake, oriented x3, in no acute distress.  HEENT: Elmo/AT PEERL, EOMI Neck: Trachea midline,  no masses, no thyromegal,y no JVD, no carotid bruit OROPHARYNX:  Moist, No exudate/ erythema/lesions.  Heart: Regular rate and rhythm, without murmurs, rubs, gallops, PMI non-displaced, no heaves or thrills on palpation.  Lungs: Clear to auscultation, no wheezing or rhonchi noted. No increased vocal fremitus resonant to percussion  Abdomen: Soft, nontender, nondistended, positive bowel sounds, no masses no hepatosplenomegaly noted..  Neuro: Cranial nerves II through XII grossly intact. DTRs 2+ bilaterally upper and  lower extremities. Strength 5/5 in bilateral upper extremities and right lower extremity. Strength 3/5 and left lower extremity.. Musculoskeletal: No warm swelling or erythema around joints, no spinal tenderness noted. Psychiatric: Patient alert and oriented x3, good insight and cognition, good recent to remote recall.   Lab results:  [facility] 12/16/11 1015  NA 139  K 4.3  CL 101  CO2 29  GLUCOSE 99  BUN 20  CREATININE 1.20  CALCIUM 9.6  MG --  PHOS --    Basename 12/16/11 1015  AST 19    ALT 18  ALKPHOS 63  BILITOT 0.5  PROT 6.7  ALBUMIN 4.2   No results found for this basename: LIPASE:2,AMYLASE:2 in the last 72 hours  Basename 12/16/11 1015  WBC 5.0  NEUTROABS 3.1  HGB 14.5  HCT 41.6  MCV 93.9  PLT 134*    Basename 12/16/11 1015  CKTOTAL 87  CKMB 3.3  CKMBINDEX --  TROPONINI <0.30   No components found with this basename: POCBNP:3 No results found for this basename: DDIMER:2 in the last 72 hours No results found for this basename: HGBA1C:2 in the last 72 hours No results found for this basename: CHOL:2,HDL:2,LDLCALC:2,TRIG:2,CHOLHDL:2,LDLDIRECT:2 in the last 72 hours No results found for this basename: TSH,T4TOTAL,FREET3,T3FREE,THYROIDAB in the last 72 hours No results found for this basename: VITAMINB12:2,FOLATE:2,FERRITIN:2,TIBC:2,IRON:2,RETICCTPCT:2 in the last 72 hours Imaging results:  Ct Head Wo Contrast  12/16/2011  *RADIOLOGY REPORT*  Clinical Data: Left leg weakness.  Left leg and foot numbness.  CT HEAD WITHOUT CONTRAST  Technique:  Contiguous axial images were obtained from the base of the skull through the vertex without contrast.  Comparison: None.  Findings: The brain shows generalized atrophy.  There is extensive chronic small vessel disease throughout the brain.  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  There are old small vessel changes affecting the pons.  The cerebral hemispheres show confluent chronic small vessel disease throughout the white matter.  Old small vessel insults effect the thalami and basal ganglia.  No old or acute cortical or large vessel territory infarction is evident.  With respect to the white matter disease, an acute or subacute small vessel infarction could be hidden amongst the extensive chronic changes.  No evidence of mass lesion, hemorrhage, hydrocephalus or extra-axial collection.  No calvarial abnormality.  No inflammatory sinus disease.  There is atherosclerotic calcification of the major  vessels at the base of the brain.  IMPRESSION: No identifiably acute finding.  No intracranial hemorrhage. Advanced chronic atrophy and small vessel disease throughout the brain.   Original Report Authenticated By: Thomasenia Sales, M.D.    Other results: EKG: normal EKG,normal sinus rhythm.   Patient Active Hospital Problem List: No active hospital problems. TIA versus CVA: Patient will be admitted and put on antiplatelet therapy with aspirin 325 mg by mouth daily. He'll also have an MRI/MRA ordered as well as carotid duplex and 2-D echocardiogram. We'll check a lipid panel hemoglobin A1c. Neurology is on consult and will also see the patient in consultation.  DVT prophylaxis with Lovenox.  Celenia Hruska A. 12/16/2011, 2:30 PM

## 2011-12-16 NOTE — Progress Notes (Signed)
VASCULAR LAB PRELIMINARY  PRELIMINARY  PRELIMINARY  PRELIMINARY  Carotid duplex  completed.    Preliminary report:  Bilateral:  No evidence of hemodynamically significant internal carotid artery stenosis.   Vertebral artery flow is antegrade.     Drisana Schweickert, RVT 12/16/2011, 4:49 PM

## 2011-12-16 NOTE — Telephone Encounter (Signed)
[facility] Phone Call with Dr. Rosalia Hammers of [facility]  Jeffrey Hardy, is a 76 y.o. male,   MRN: [id number]  -  DOB - 1935-08-26  Outpatient Primary MD for the patient is Oliver Barre, MD   @V @  Active Problems:  * No active hospital problems. *    76 yo male with history of HTN and HLD who is part of an aspirin study (he is uncertain if he's taking aspirin or not), developed Left foot numbness this morning at 7am.  The numbness ascended into his left hip.  CT Head is negative for acute changes.  Glucose is wnl.  Per EDP cbc is wnl and bmet is pending.  She spoke to Dr. Petra Kuba of neurology who agreed to see the patient in consultation at [facility].  I have requested a neuro tele bed.     Algis Downs, PA-C [facility] Pager: [phone]

## 2011-12-16 NOTE — ED Notes (Signed)
Patient states he woke up this morning at 0630 am and felt normal.  States at 0700 am he developed sudden numbness in his left foot which has progressed up to his knee.  Now has weakness in his left leg and numbness from his left knee to the foot.

## 2011-12-16 NOTE — Consult Note (Signed)
[facility] CONSULT [facility]   Reason for Consult: transient left leg weakness   HPI:   Jeffrey Hardy is an 76 y.o. male who currently is enrolled in the "aspree" trial at [facility]. Patient awoke this AM at his baseline.  As he was sitting in the kitchen at 7 AM he went to stand up and noted his left foot had decreased sensation and he had difficulty moving his left leg.  He went about his normal activities until his wife noted he was walking funny and brought him to the hospital.  Over time, his left leg symptoms have improved but he still feels his strength has not fully returned to baseline. CT head was negative for acute stroke.   He denies back pain.   Past Medical History  Diagnosis Date  . Hypertension   . Erectile dysfunction   . Hyperlipidemia   . Anxiety   . Depression   . GERD (gastroesophageal reflux disease)   . Diverticula, colon   . Syncope     History  . Mitral regurgitation   . Peptic ulcer disease     with Duodenal and Antral ulcers  . H. pylori infection   . HYPERLIPIDEMIA 05/12/2008  . ANXIETY 05/12/2008  . DEPRESSION 05/12/2008  . HYPERTENSION 05/12/2008  . GERD 05/12/2008  . PEPTIC ULCER DISEASE 05/12/2008  . DIVERTICULOSIS, COLON 05/12/2008  . OVERACTIVE BLADDER 05/12/2008    Past Surgical History  Procedure Date  . Appendectomy     Family History  Problem Relation Age of Onset  . Parkinsonism Brother   . Dementia Brother     Social History:  reports that he has never smoked. He does not have any smokeless tobacco history on file. He reports that he does not drink alcohol or use illicit drugs.  No Known Allergies  Medications:    Prior to Admission:  Prescriptions prior to admission  Medication Sig Dispense Refill  . "aspree" trial       . omeprazole (PRILOSEC) 20 MG capsule Take 20 mg by mouth daily as needed.          Review of Systems - General ROS: negative for - chills, fatigue, fever or hot  flashes Hematological and Lymphatic ROS: negative for - bruising, fatigue, jaundice or pallor Endocrine ROS: negative for - hair pattern changes, hot flashes, mood swings or skin changes Respiratory ROS: negative for - cough, hemoptysis, orthopnea or wheezing Cardiovascular ROS: negative for - dyspnea on exertion, orthopnea, palpitations or shortness of breath Gastrointestinal ROS: negative for - abdominal pain, appetite loss, blood in stools, diarrhea or hematemesis Musculoskeletal ROS: negative for - joint pain, joint stiffness, joint swelling or muscle pain Neurological ROS: positive for - numbness/tingling and weakness Dermatological ROS: negative for dry skin, pruritus and rash   Blood pressure 149/74, pulse 63, temperature 97.7 F (36.5 C), temperature source Oral, resp. rate 20, height 6\' 2"  (1.88 m), weight 97.523 kg (215 lb), SpO2 100.00%.   Neurologic Examination:   Mental Status: Alert, oriented X 3.  Speech fluent without evidence of aphasia. Able to follow 3 step commands without difficulty. Mood:upbeat Memory: he is intact to where he is, able to spell world backwards without difficulty, able to count Serial 7's, able to recall 3 out of 3 words.  Thought content appropriate Frontal release signs: Palmomental present bilaterally Cranial Nerves: II-Visual fields grossly  intact. III/IV/VI-Extraocular movements intact.  Pupils reactive bilaterally. Ptosis not present. V/VII-Smile symmetric VIII-grossly intact IX/X-normal gag XI-bilateral shoulder shrug XII-midline tongue extension Motor:      Right  Left 5/5 in UE bilaterally Hip Flexion   5/5  4-/5 Knee Flexion   5/5  4/5 Knee Extension  5/5  5/5 Ankle Dorsiflexion  5/5  4+/5 Ankle Plantarflexion  5/5  5/5 Slight positive orbiting of right hand around left.   Sensory: Pinprick and light touch intact throughout, bilaterally.  Deep Tendon Reflexes:  Right: Upper Extremity   Left: Upper extremity   biceps (C-5 to  C-6) 2/4   biceps (C-5 to C-6) 2/4 tricep (C7) 2/4    triceps (C7) 2/4 Brachioradialis (C6) 2/4  Brachioradialis (C6) 2/4  Lower Extremity Lower Extremity  quadriceps (L-2 to L-4) 2/4   quadriceps (L-2 to L-4) 2/4 Achilles (S1) 0/4   Achilles (S1) 0/4      Plantars:      Right:  downgoing     Left:  Downgoing Cerebellar: Normal finger-to-nose, normal heel-to-shin test.     Lab Results  Component Value Date/Time   CHOL 154 07/27/2010 10:49 AM    Results for orders placed during the hospital encounter of 12/16/11 (from the past 48 hour(s))  URINE RAPID DRUG SCREEN (HOSP PERFORMED)     Status: Normal   Collection Time   12/16/11  9:48 AM      Component Value Range Comment   Opiates NONE DETECTED  NONE DETECTED    Cocaine NONE DETECTED  NONE DETECTED    Benzodiazepines NONE DETECTED  NONE DETECTED    Amphetamines NONE DETECTED  NONE DETECTED    Tetrahydrocannabinol NONE DETECTED  NONE DETECTED    Barbiturates NONE DETECTED  NONE DETECTED   GLUCOSE, CAPILLARY     Status: Abnormal   Collection Time   12/16/11 10:08 AM      Component Value Range Comment   Glucose-Capillary 106 (*) 70 - 99 mg/dL   PROTIME-INR     Status: Normal   Collection Time   12/16/11 10:15 AM      Component Value Range Comment   Prothrombin Time 12.7  11.6 - 15.2 seconds    INR 0.93  0.00 - 1.49   APTT     Status: Normal   Collection Time   12/16/11 10:15 AM      Component Value Range Comment   aPTT 31  24 - 37 seconds   CBC     Status: Abnormal   Collection Time   12/16/11 10:15 AM      Component Value Range Comment   WBC 5.0  4.0 - 10.5 K/uL    RBC 4.43  4.22 - 5.81 MIL/uL    Hemoglobin 14.5  13.0 - 17.0 g/dL    HCT 16.1  09.6 - 04.5 %    MCV 93.9  78.0 - 100.0 fL    MCH 32.7  26.0 - 34.0 pg    MCHC 34.9  30.0 - 36.0 g/dL    RDW 40.9  81.1 - 91.4 %    Platelets 134 (*) 150 - 400 K/uL   DIFFERENTIAL     Status: Abnormal   Collection Time   12/16/11 10:15 AM      Component Value Range Comment    Neutrophils Relative 63  43 - 77 %    Neutro Abs 3.1  1.7 - 7.7 K/uL    Lymphocytes Relative 22  12 - 46 %  Lymphs Abs 1.1  0.7 - 4.0 K/uL    Monocytes Relative 14 (*) 3 - 12 %    Monocytes Absolute 0.7  0.1 - 1.0 K/uL    Eosinophils Relative 1  0 - 5 %    Eosinophils Absolute 0.1  0.0 - 0.7 K/uL    Basophils Relative 0  0 - 1 %    Basophils Absolute 0.0  0.0 - 0.1 K/uL   COMPREHENSIVE METABOLIC PANEL     Status: Abnormal   Collection Time   12/16/11 10:15 AM      Component Value Range Comment   Sodium 139  135 - 145 mEq/L    Potassium 4.3  3.5 - 5.1 mEq/L    Chloride 101  96 - 112 mEq/L    CO2 29  19 - 32 mEq/L    Glucose, Bld 99  70 - 99 mg/dL    BUN 20  6 - 23 mg/dL    Creatinine, Ser 7.82  0.50 - 1.35 mg/dL    Calcium 9.6  8.4 - 95.6 mg/dL    Total Protein 6.7  6.0 - 8.3 g/dL    Albumin 4.2  3.5 - 5.2 g/dL    AST 19  0 - 37 U/L    ALT 18  0 - 53 U/L    Alkaline Phosphatase 63  39 - 117 U/L    Total Bilirubin 0.5  0.3 - 1.2 mg/dL    GFR calc non Af Amer 58 (*) >90 mL/min    GFR calc Af Amer 67 (*) >90 mL/min   CK TOTAL AND CKMB     Status: Normal   Collection Time   12/16/11 10:15 AM      Component Value Range Comment   Total CK 87  7 - 232 U/L    CK, MB 3.3  0.3 - 4.0 ng/mL    Relative Index RELATIVE INDEX IS INVALID  0.0 - 2.5   TROPONIN I     Status: Normal   Collection Time   12/16/11 10:15 AM      Component Value Range Comment   Troponin I <0.30  <0.30 ng/mL     Ct Head Wo Contrast  12/16/2011  *RADIOLOGY REPORT*  Clinical Data: Left leg weakness.  Left leg and foot numbness.  CT HEAD WITHOUT CONTRAST  Technique:  Contiguous axial images were obtained from the base of the skull through the vertex without contrast.  Comparison: None.  Findings: The brain shows generalized atrophy.  There is extensive chronic small vessel disease throughout the brain.  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  There are old small vessel changes affecting the pons.   The cerebral hemispheres show confluent chronic small vessel disease throughout the white matter.  Old small vessel insults effect the thalami and basal ganglia.  No old or acute cortical or large vessel territory infarction is evident.  With respect to the white matter disease, an acute or subacute small vessel infarction could be hidden amongst the extensive chronic changes.  No evidence of mass lesion, hemorrhage, hydrocephalus or extra-axial collection.  No calvarial abnormality.  No inflammatory sinus disease.  There is atherosclerotic calcification of the major vessels at the base of the brain.  IMPRESSION: No identifiably acute finding.  No intracranial hemorrhage. Advanced chronic atrophy and small vessel disease throughout the brain.   Original Report Authenticated By: Thomasenia Sales, M.D.      Assessment/Plan:   76 YO male with left  foot numbness and weakness that started abruptly this morning. His description of circumferential numbness(though resolved at this point) and diffuse leg weakness without back pain would suggest a central etiology. It is possible that an L3+L4 radiculopathy could give a similar picture including the weakness pattern seen and therefore if his brain MRI is negative, then a lumbar MRI to evaluate his L-Spine would be indicated.   Recommend: 1. HgbA1c, fasting lipid panel 2. MRI, MRA  of the brain without contrast 3. PT consult, OT consult 4. Echocardiogram 5. Carotid dopplers 6. Prophylactic therapy-If previously on ASA, would change to plavix, if not then would use ASA.  7. Risk factor modification 8. Telemetry monitoring 9. Frequent neuro checks 10. If brain MRI negative, would image L-spine.    Felicie Morn PA-C [facility] [phone]  12/16/2011, 2:42 PM   I have seen and evaluated the patient. I have reviewed the above note and made appropriate changes.   Ritta Slot, MD [facility] [phone]

## 2011-12-17 ENCOUNTER — Inpatient Hospital Stay (HOSPITAL_COMMUNITY): Payer: Medicare Other

## 2011-12-17 LAB — LIPID PANEL
Cholesterol: 150 mg/dL (ref 0–200)
HDL: 33 mg/dL — ABNORMAL LOW (ref >39)
LDL Cholesterol: 93 mg/dL (ref 0–99)
Total CHOL/HDL Ratio: 4.5 RATIO
Triglycerides: 118 mg/dL (ref <150)
VLDL: 24 mg/dL (ref 0–40)

## 2011-12-17 LAB — HEMOGLOBIN A1C
Hgb A1c MFr Bld: 5.4 % (ref <5.7)
Mean Plasma Glucose: 108 mg/dL (ref <117)

## 2011-12-17 NOTE — Care Management Note (Signed)
    Page 1 of 1   12/20/2011     9:33:33 AM   CARE MANAGEMENT NOTE 12/20/2011  Patient:  Jeffrey Hardy, Jeffrey Hardy   Account Number:  [id number]  Date Initiated:  12/17/2011  Documentation initiated by:  Onnie Boer  Subjective/Objective Assessment:   PT WAS ADMITTED WITH WEAKNESS     Action/Plan:   PROGRESSION OF CARE AND DISCHARGE PLANNING   Anticipated DC Date:  12/19/2011   Anticipated DC Plan:  SKILLED NURSING FACILITY  In-house referral  Clinical Social Worker      DC Planning Services  CM consult      Choice offered to / List presented to:             Status of service:  Completed, signed off Medicare Important Message given?   (If response is "NO", the following Medicare IM given date fields will be blank) Date Medicare IM given:   Date Additional Medicare IM given:    Discharge Disposition:  HOME/SELF CARE  Per UR Regulation:  Reviewed for med. necessity/level of care/duration of stay  If discussed at Long Length of Stay Meetings, dates discussed:    Comments:  12/19/11 Onnie Boer, RN, BSN 0932 PT WAS DC'D TO HOME WITH SELF CARE.  INFORMATION WAS FAXED TO [facility] FOR OP PT/OT  12/17/11 Onnie Boer, RN, BSN 1408 PT WAS ADMITTED WITH WEAKNESS, PTA PT WAS AT HOME WITH SELF CARE.  WILL F/U ON DC NEEDS AND RECOMMENDATIONS.

## 2011-12-17 NOTE — Progress Notes (Signed)
Subjective: Patient admitted with left lower extremity weakness. Continues to have some weakness. No speech or swallow deficits and no cognitive deficits. Workup in progress. Objective: Filed Vitals:   12/17/11 0600 12/17/11 1009 12/17/11 1420 12/17/11 1833  BP: 149/82 146/76 156/82 131/60  Pulse: 58 65 67 71  Temp: 98.2 F (36.8 C) 97.1 F (36.2 C) 97.7 F (36.5 C) 98.6 F (37 C)  TempSrc: Oral Oral Oral Oral  Resp: 20 20 20 20   Height:      Weight:      SpO2: 98% 98% 98% 98%   Weight change:   Intake/Output Summary (Last 24 hours) at 12/17/11 1856 Last data filed at 12/17/11 1200  Gross per 24 hour  Intake    246 ml  Output   1450 ml  Net  -1204 ml    General: Alert, awake, oriented x3, in no acute distress.   Heart: Regular rate and rhythm, without murmurs, rubs, gallops, PMI non-displaced, no heaves or thrills on palpation.  Lungs: Clear to auscultation, no wheezing or rhonchi noted. No increased vocal fremitus resonant to percussion  Abdomen: Soft, nontender, nondistended, positive bowel sounds, no masses no hepatosplenomegaly noted..  Neuro:Cranial nerves II through XII grossly intact. DTRs 2+ bilaterally upper and lower extremities. Strength 5/5 in bilateral upper extremities and right lower extremity. Strength 3/5 and left lower extremity.  Musculoskeletal: No warm swelling or erythema around joints, no spinal tenderness noted.    Lab Results:  Basename 12/16/11 1647 12/16/11 1015  NA -- 139  K -- 4.3  CL -- 101  CO2 -- 29  GLUCOSE -- 99  BUN -- 20  CREATININE 1.11 1.20  CALCIUM -- 9.6  MG -- --  PHOS -- --    Basename 12/16/11 1015  AST 19  ALT 18  ALKPHOS 63  BILITOT 0.5  PROT 6.7  ALBUMIN 4.2   No results found for this basename: LIPASE:2,AMYLASE:2 in the last 72 hours  Basename 12/16/11 1647 12/16/11 1015  WBC 5.8 5.0  NEUTROABS -- 3.1  HGB 15.1 14.5  HCT 43.7 41.6  MCV 94.8 93.9  PLT 138* 134*    Basename 12/16/11 1015  CKTOTAL 87    CKMB 3.3  CKMBINDEX --  TROPONINI <0.30   No components found with this basename: POCBNP:3 No results found for this basename: DDIMER:2 in the last 72 hours  Basename 12/17/11 0510  HGBA1C 5.4    Basename 12/17/11 0510  CHOL 150  HDL 33*  LDLCALC 93  TRIG 469  CHOLHDL 4.5  LDLDIRECT --   No results found for this basename: TSH,T4TOTAL,FREET3,T3FREE,THYROIDAB in the last 72 hours No results found for this basename: VITAMINB12:2,FOLATE:2,FERRITIN:2,TIBC:2,IRON:2,RETICCTPCT:2 in the last 72 hours  Micro Results: No results found for this or any previous visit (from the past 240 hour(s)).  Studies/Results: Dg Chest 2 View  12/16/2011  *RADIOLOGY REPORT*  Clinical Data: Shortness of breath, weakness  CHEST - 2 VIEW  Comparison: None.  Findings: The lungs are clear but somewhat hyperaerated possibly indicated COPD.  Mild apical pleural thickening is present.  The heart is within upper limits of normal in size for age.  The bones are osteopenic.  IMPRESSION: No active lung disease.  Hyperaeration may indicate COPD.   Original Report Authenticated By: Juline Patch, M.D.    Ct Head Wo Contrast  12/16/2011  *RADIOLOGY REPORT*  Clinical Data: Left leg weakness.  Left leg and foot numbness.  CT HEAD WITHOUT CONTRAST  Technique:  Contiguous axial images were  obtained from the base of the skull through the vertex without contrast.  Comparison: None.  Findings: The brain shows generalized atrophy.  There is extensive chronic small vessel disease throughout the brain.  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  There are old small vessel changes affecting the pons.  The cerebral hemispheres show confluent chronic small vessel disease throughout the white matter.  Old small vessel insults effect the thalami and basal ganglia.  No old or acute cortical or large vessel territory infarction is evident.  With respect to the white matter disease, an acute or subacute small vessel  infarction could be hidden amongst the extensive chronic changes.  No evidence of mass lesion, hemorrhage, hydrocephalus or extra-axial collection.  No calvarial abnormality.  No inflammatory sinus disease.  There is atherosclerotic calcification of the major vessels at the base of the brain.  IMPRESSION: No identifiably acute finding.  No intracranial hemorrhage. Advanced chronic atrophy and small vessel disease throughout the brain.   Original Report Authenticated By: Thomasenia Sales, M.D.     Medications: I have reviewed the patient's current medications. Scheduled Meds:   . aspirin  300 mg Rectal Daily   Or  . aspirin  325 mg Oral Daily  . enoxaparin  40 mg Subcutaneous q1800  . pantoprazole  40 mg Oral Q1200  . sodium chloride  3 mL Intravenous Q12H   Continuous Infusions:  PRN Meds:.sodium chloride, acetaminophen, acetaminophen, senna-docusate, sodium chloride Assessment/Plan: Patient Active Hospital Problem List: TIA (transient ischemic attack) (12/16/2011)   Assessment: Patient admitted with left hemiparesis and left hemisensory defect. MRI and MRA are still pending. The patient is presently on aspirin 325 mg daily for secondary stroke prevention. Carotid Dopplers show no evidence of hemodynamically significant ICA stenosis. Echocardiogram results are still pending.  Panel shows LDL below 100restarted. However the patient does have a low HDL which is negative risk factor. However in discussion with him he prefers to defer to his primary care physician for initial any medications.   Plan: Continue aspirin      LOS: 1 day

## 2011-12-17 NOTE — Evaluation (Signed)
Occupational Therapy Evaluation Patient Details Name: Jeffrey Hardy MRN: [id number] DOB: 05-13-1935 Today's Date: 12/17/2011 Time: 1308-6578 OT Time Calculation (min): 26 min  OT Assessment / Plan / Recommendation Clinical Impression  this 76 y.o. male admitted with Lt. LE weakness.  Stroke work up in progress.  Pt. presents to OT with impaired pursuits and saccades on Lt. as well as impaired balance, and questionable attentional deficits (unsure of pt's baseline).  Feel pt. will benefit from acute OT to address the below deficits to allow pt. to return home with spouse at supervision level.  Recommend HHOT    OT Assessment  Patient does not need any further OT services    Follow Up Recommendations  Home health OT;Supervision - Intermittent    Barriers to Discharge      Equipment Recommendations  None recommended by OT    Recommendations for Other Services    Frequency       Precautions / Restrictions Precautions Precautions: Fall Restrictions Weight Bearing Restrictions: No       ADL  Eating/Feeding: Simulated;Independent Where Assessed - Eating/Feeding: Edge of bed Grooming: Simulated;Wash/dry hands;Wash/dry face;Teeth care;Min guard Where Assessed - Grooming: Supported standing Upper Body Bathing: Simulated;Min guard Where Assessed - Upper Body Bathing: Supported standing Lower Body Bathing: Simulated;Min guard Where Assessed - Lower Body Bathing: Supported standing Upper Body Dressing: Simulated;Set up Where Assessed - Upper Body Dressing: Unsupported sitting Lower Body Dressing: Simulated;Min guard Where Assessed - Lower Body Dressing: Unsupported sit to stand Toilet Transfer: Simulated;Min guard Toilet Transfer Method: Sit to Barista: Comfort height toilet Toileting - Clothing Manipulation and Hygiene: Simulated;Min guard Where Assessed - Engineer, mining and Hygiene: Standing Tub/Shower Transfer: Engineer, petroleum Method: Science writer: Walk in shower Transfers/Ambulation Related to ADLs: min guard to min A with no AD in the room - pt. furniture walks ADL Comments: Pt. simulated showering in standing with min guard assist with UE support    OT Diagnosis:    OT Problem List:   OT Treatment Interventions:     OT Goals Acute Rehab OT Goals OT Goal Formulation: With patient Time For Goal Achievement: 12/24/11 Potential to Achieve Goals: Good ADL Goals Pt Will Perform Grooming: with supervision;Standing at sink ADL Goal: Grooming - Progress: Goal set today Pt Will Perform Upper Body Bathing: with supervision;Standing at sink ADL Goal: Upper Body Bathing - Progress: Goal set today Pt Will Perform Lower Body Bathing: with supervision;Standing at sink ADL Goal: Lower Body Bathing - Progress: Goal set today Pt Will Perform Lower Body Dressing: with supervision;Sit to stand from chair;Sit to stand from bed ADL Goal: Lower Body Dressing - Progress: Goal set today Pt Will Transfer to Toilet: with supervision ADL Goal: Toilet Transfer - Progress: Goal set today Pt Will Perform Toileting - Clothing Manipulation: with supervision;Standing ADL Goal: Toileting - Clothing Manipulation - Progress: Goal set today Pt Will Perform Tub/Shower Transfer: with supervision;Ambulation;with DME ADL Goal: Tub/Shower Transfer - Progress: Goal set today Additional ADL Goal #1: Pt. will locate items in visually challenging environment with no cues. ADL Goal: Additional Goal #1 - Progress: Goal set today  Visit Information  Last OT Received On: 12/17/11 Assistance Needed: +1    Subjective Data  Subjective: "I just get distracted" Patient Stated Goal: Pt. 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 than Rt) Visual Fields: No apparent deficits Additional Comments: Pt. consistently loses object when testing pursuits to the Lt.  When this was pointed out to pt. and instructions provided again, pt. continues to have difficulty.  Pt. reports he becomes distractable, but does not appear that he is aware of the difficulty Perception Perception: Within Functional Limits Praxis Praxis: Intact  Cognition  Overall Cognitive Status: Impaired Area of Impairment: Attention Arousal/Alertness: Awake/alert Orientation Level: Appears intact for tasks assessed Behavior During Session: [facility] for tasks performed Current Attention Level: Selective Attention - Other Comments: Unsure of pt's baseline Cognition - Other Comments: easily distracted and loves to tell stories that are not pertinent to what is going on     Extremity/Trunk Assessment Right Upper Extremity Assessment RUE ROM/Strength/Tone: Within functional levels RUE Sensation: WFL - Light Touch RUE Coordination: WFL - gross/fine motor Left Upper Extremity Assessment LUE ROM/Strength/Tone: Within functional levels LUE Sensation: WFL - Light Touch LUE  Coordination: WFL - gross/fine motor Right Lower Extremity Assessment RLE ROM/Strength/Tone: Within functional levels Left Lower Extremity Assessment LLE ROM/Strength/Tone: Deficits LLE ROM/Strength/Tone Deficits: hip flexion: 4/5, knee flex/ext: 4+/5, hip add: 5/5, hip abd: 4-/5, DF: 4/5, PF: 5/5 LLE Sensation: WFL - Light Touch Trunk Assessment Trunk Assessment: Normal   Mobility  Shoulder Instructions  Bed Mobility Bed Mobility: Supine to Sit Supine to Sit: 6: Modified independent (Device/Increase time);HOB elevated Transfers Transfers: Sit to Stand;Stand to Sit Sit to Stand: 4: Min guard;With upper extremity assist;From bed Stand to Sit: 4: Min guard;Without upper extremity assist;To chair/3-in-1 Details for Transfer Assistance: cues for safe hand placement especially when using SPC, minA initially for stability       Exercise     Balance Balance Balance Assessed: Yes Static Standing Balance Single Leg Stance - Right Leg: 3  Rhomberg - Eyes Opened: 30  Rhomberg - Eyes Closed: 15  Dynamic Standing Balance Dynamic Standing - Balance Support: Right upper extremity supported Dynamic Standing - Level of Assistance: 4: Min assist Dynamic Standing - Balance Activities: Other (comment) (simulated shower) Dynamic Standing - Comments: Sections of the DGI performed including head turns, gait pivot turns and changes in speed; pt needing to slow down to complete task, no LOB noted   End of Session OT - End of Session Activity Tolerance: Patient tolerated treatment well Patient left: in bed;with call bell/phone within reach  GO     Auston Halfmann M 12/17/2011, 12:38 PM

## 2011-12-17 NOTE — Evaluation (Signed)
Physical Therapy Evaluation Patient Details Name: Jeffrey Hardy MRN: [id number] DOB: [birth year] Today's Date: 12/17/2011 Time: 4098-1191 PT Time Calculation (min): 39 min  PT Assessment / Plan / Recommendation Clinical Impression  [name] is 76 y/o male admitted with gait impairment and LLE weakness. Presents to PT today with the below deficits affecting his safety and prior level of function. Will benefit physical therapy in the acute setting to address the below impairments so as to maximize mobility  and safety for d/c home. Pt reports increased fatigue of his left leg with increased walking. Pt with improved gait using SPC and reports he has one at home. Rec HHPT for f/u with supervision for OOB/mobility.     PT Assessment  Patient needs continued PT services    Follow Up Recommendations  Home health PT;Supervision for mobility/OOB    Barriers to Discharge        Equipment Recommendations  None recommended by PT    Recommendations for Other Services     Frequency Min 4X/week    Precautions / Restrictions Precautions Precautions: Fall         Mobility  Bed Mobility Bed Mobility: Supine to Sit Supine to Sit: 6: Modified independent (Device/Increase time);HOB elevated (30 degrees) Transfers Transfers: Sit to Stand;Stand to Sit Sit to Stand: 4: Min assist;With upper extremity assist;From bed;From chair/3-in-1;With armrests Stand to Sit: 4: Min guard;With upper extremity assist;To chair/3-in-1;With armrests Details for Transfer Assistance: cues for safe hand placement especially when using SPC, minA initially for stability Ambulation/Gait Ambulation/Gait Assistance: 4: Min assist;4: Min guard Ambulation Distance (Feet): 200 Feet Assistive device: Straight cane;None Ambulation/Gait Assistance Details: pt ambulated with no AD for 100 ft but required modA for one LOB as he stubbed his foot on the opening door and additional minA occassionally during gait for stability; with  SPC pt requiring mingaurdA for safe technique utilizing SPC Gait Pattern: Decreased stance time - left;Decreased step length - left;Decreased hip/knee flexion - left;Decreased dorsiflexion - left General Gait Details: pt with antalgic type gait (reports old knee injury causing his knee to "turn in" ; with increased fatigue the patient appears to drag his left leg a bit more Modified Rankin (Stroke Patients Only) Pre-Morbid Rankin Score: No symptoms Modified Rankin: Moderate disability    Exercises     PT Diagnosis: Difficulty walking;Abnormality of gait;Generalized weakness  PT Problem List: Decreased strength;Decreased activity tolerance;Decreased balance;Decreased mobility PT Treatment Interventions: DME instruction;Gait training;Stair training;Functional mobility training;Therapeutic activities;Therapeutic exercise;Balance training;Neuromuscular re-education;Patient/family education   PT Goals Acute Rehab PT Goals PT Goal Formulation: With patient Time For Goal Achievement: 12/24/11 Potential to Achieve Goals: Good Pt will go Sit to Stand: with modified independence PT Goal: Sit to Stand - Progress: Goal set today Pt will go Stand to Sit: with modified independence PT Goal: Stand to Sit - Progress: Goal set today Pt will Ambulate: >150 feet;with modified independence;with least restrictive assistive device PT Goal: Ambulate - Progress: Goal set today Pt will Go Up / Down Stairs: 1-2 stairs;with rail(s);with modified independence PT Goal: Up/Down Stairs - Progress: Goal set today  Visit Information  Last PT Received On: 12/17/11 Assistance Needed: +1    Subjective Data  Subjective: "Well my leg just feels tight, but look at this (pt performs SLR). 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    Cognition  Overall Cognitive Status: Appears within functional limits for tasks assessed/performed Arousal/Alertness: Awake/alert Orientation Level: Appears intact for tasks assessed Behavior During Session: [facility] for tasks performed Cognition - Other Comments: easily distracted and loves to tell stories that are not pertinent to what is going on     Extremity/Trunk Assessment Right Upper Extremity Assessment RUE ROM/Strength/Tone: Within functional levels RUE Sensation: WFL - Light Touch Left Upper Extremity Assessment LUE ROM/Strength/Tone: Within functional levels LUE Sensation: WFL - Light Touch Right Lower Extremity Assessment RLE ROM/Strength/Tone: Within functional levels Left Lower Extremity Assessment LLE ROM/Strength/Tone: Deficits LLE ROM/Strength/Tone Deficits: hip flexion: 4/5, knee flex/ext: 4+/5, hip add: 5/5, hip abd: 4-/5, DF: 4/5, PF: 5/5 LLE Sensation: WFL - Light Touch Trunk Assessment Trunk Assessment: Normal   Balance Static Standing Balance Single Leg Stance - Right Leg: 3  Rhomberg - Eyes Opened: 30  Rhomberg - Eyes Closed: 15  Dynamic Standing Balance Dynamic Standing - Comments: Sections of the DGI performed including head turns, gait pivot turns and changes in speed; pt needing to slow down to complete task, no LOB noted  End of Session PT - End of Session Equipment Utilized During Treatment: Gait belt Activity Tolerance: Patient tolerated treatment well Patient left: in chair;with call  bell/phone within reach;with nursing in room Nurse Communication: Mobility status  GP     [facility] HELEN 12/17/2011, 11:28 AM

## 2011-12-17 NOTE — Progress Notes (Signed)
Stroke Team Progress Note  HISTORY Jeffrey Hardy is an 76 y.o. male who currently is enrolled in the "aspree" trial at [facility]. Patient awoke this AM at his baseline. As he was sitting in the kitchen at 7 AM 12/16/2011 he went to stand up and noted his left foot had decreased sensation and he had difficulty moving his left leg. He went about his normal activities until his wife noted he was walking funny and brought him to the hospital. Over time, his left leg symptoms have improved but he still feels his strength has not fully returned to baseline. CT head was negative for acute stroke. Patient was not a TPA candidate secondary to mild symptoms per report. He was admitted for further evaluation and treatment.  SUBJECTIVE No family  is at the bedside.  Overall he feels his condition is rapidly improving.   OBJECTIVE Most recent Vital Signs: Filed Vitals:   12/17/11 0000 12/17/11 0200 12/17/11 0600 12/17/11 1009  BP: 141/73 149/82 149/82 146/76  Pulse: 59 56 58 65  Temp: 98.2 F (36.8 C) 97.5 F (36.4 C) 98.2 F (36.8 C) 97.1 F (36.2 C)  TempSrc: Oral Oral Oral Oral  Resp: 20 20 20 20   Height:      Weight:      SpO2: 99% 99% 98% 98%   CBG (last 3)   Basename 12/16/11 1008  GLUCAP 106*   Intake/Output from previous day: 09/09 0701 - 09/10 0700 In: 243 [P.O.:240; I.V.:3] Out: 700 [Urine:700]  IV Fluid Intake:     MEDICATIONS    . aspirin  300 mg Rectal Daily   Or  . aspirin  325 mg Oral Daily  . enoxaparin  40 mg Subcutaneous q1800  . pantoprazole  40 mg Oral Q1200  . sodium chloride  3 mL Intravenous Q12H   PRN:  sodium chloride, acetaminophen, acetaminophen, senna-docusate, sodium chloride  Diet:  Cardiac thin liquids Activity:  OOB with assistance DVT Prophylaxis:  Lovenox 40 mg sq daily   CLINICALLY SIGNIFICANT STUDIES Basic Metabolic Panel:  Lab 12/16/11 4540 12/16/11 1015  NA -- 139  K -- 4.3  CL -- 101  CO2 -- 29  GLUCOSE -- 99  BUN -- 20    CREATININE 1.11 1.20  CALCIUM -- 9.6  MG -- --  PHOS -- --   Liver Function Tests:  Lab 12/16/11 1015  AST 19  ALT 18  ALKPHOS 63  BILITOT 0.5  PROT 6.7  ALBUMIN 4.2   CBC:  Lab 12/16/11 1647 12/16/11 1015  WBC 5.8 5.0  NEUTROABS -- 3.1  HGB 15.1 14.5  HCT 43.7 41.6  MCV 94.8 93.9  PLT 138* 134*   Coagulation:  Lab 12/16/11 1015  LABPROT 12.7  INR 0.93   Cardiac Enzymes:  Lab 12/16/11 1015  CKTOTAL 87  CKMB 3.3  CKMBINDEX --  TROPONINI <0.30   Urinalysis: No results found for this basename: COLORURINE:2,APPERANCEUR:2,LABSPEC:2,PHURINE:2,GLUCOSEU:2,HGBUR:2,BILIRUBINUR:2,KETONESUR:2,PROTEINUR:2,UROBILINOGEN:2,NITRITE:2,LEUKOCYTESUR:2 in the last 168 hours Lipid Panel    Component Value Date/Time   CHOL 150 12/17/2011 0510   TRIG 118 12/17/2011 0510   HDL 33* 12/17/2011 0510   CHOLHDL 4.5 12/17/2011 0510   VLDL 24 12/17/2011 0510   LDLCALC 93 12/17/2011 0510   HgbA1C  Lab Results  Component Value Date   HGBA1C 5.4 12/17/2011    Urine Drug Screen:     Component Value Date/Time   LABOPIA NONE DETECTED 12/16/2011 0948   COCAINSCRNUR NONE DETECTED 12/16/2011 0948   LABBENZ NONE DETECTED  12/16/2011 0948   AMPHETMU NONE DETECTED 12/16/2011 0948   THCU NONE DETECTED 12/16/2011 0948   LABBARB NONE DETECTED 12/16/2011 0948    Alcohol Level: No results found for this basename: ETH:2 in the last 168 hours  CT of the brain  12/16/2011  No identifiably acute finding.  No intracranial hemorrhage. Advanced chronic atrophy and small vessel disease throughout the brain.     MRI of the brain    MRA of the brain    2D Echocardiogram    Carotid Doppler  Bilateral: No evidence of hemodynamically significant internal carotid artery stenosis. Vertebral artery flow is antegrade.   CXR  12/16/2011  No active lung disease.  Hyperaeration may indicate COPD  EKG  normal EKG, normal sinus rhythm, unchanged from previous tracings.   Therapy Recommendations PT - home health; OT - home  health  Physical Exam    ASSESSMENT [name] is a 76 y.o. male presenting with left hemiparesis and left hemisensory deficit. Imaging pending. Work up underway. On aspirin 81 mg orally every day prior to admission. Now on aspirin 325 mg orally every day for secondary stroke prevention. Patient with resultant left hemiparesis and left hemisensory deficit, which is improving.   Hypertension  Hyperlipidemia, LDL 93, not on statin  HgbA1c 5.4  Anxiety  depression  Hospital day # 1  TREATMENT/PLAN  Continue aspirin 325 mg orally every day for secondary stroke prevention for now. If imaging positive for stroke, will likely change to plavix as there has been no evidence showing increasing aspirin dose adds to secondary prevention  F/u MRI, MRA, 2D  Jeffrey Main, MSN, RN, ANVP-BC, ANP-BC, GNP-BC [facility] Pager: [phone] 12/17/2011 11:18 AM  Scribe for Dr. Delia Heady, Stroke Center Medical Director, who has personally reviewed chart, pertinent data, examined the patient and developed the plan of care. Pager:  [phone]

## 2011-12-17 NOTE — Progress Notes (Signed)
  Echocardiogram 2D Echocardiogram has been performed.  Jeffrey Hardy 12/17/2011, 10:10 AM

## 2011-12-18 DIAGNOSIS — E785 Hyperlipidemia, unspecified: Secondary | ICD-10-CM

## 2011-12-18 DIAGNOSIS — Z8673 Personal history of transient ischemic attack (TIA), and cerebral infarction without residual deficits: Secondary | ICD-10-CM | POA: Diagnosis present

## 2011-12-18 MED ORDER — SIMVASTATIN 20 MG PO TABS
20.0000 mg | ORAL_TABLET | Freq: Every day | ORAL | Status: DC
  Administered 2011-12-18: 20 mg via ORAL
  Filled 2011-12-18 (×2): qty 1

## 2011-12-18 MED ORDER — SIMVASTATIN 20 MG PO TABS: 20.0000 mg | ORAL_TABLET | Freq: Every day | ORAL | Status: DC

## 2011-12-18 MED ORDER — CLOPIDOGREL BISULFATE 75 MG PO TABS: 75.0000 mg | ORAL_TABLET | Freq: Every day | ORAL | Status: DC

## 2011-12-18 MED ORDER — CLOPIDOGREL BISULFATE 75 MG PO TABS
75.0000 mg | ORAL_TABLET | Freq: Every day | ORAL | Status: DC
  Administered 2011-12-19: 75 mg via ORAL
  Filled 2011-12-18: qty 1

## 2011-12-18 NOTE — Discharge Summary (Addendum)
Physician Discharge Summary  Patient ID: Jeffrey Hardy MRN: [id number] DOB/AGE: 1935/10/14 76 y.o.  Admit date: 12/16/2011 Discharge date: 12/19/2011  PCP: Oliver Barre, MD  DISCHARGE DIAGNOSES:  Principal Problem:  *Acute ischemic stroke Active Problems:  GERD  Elevated blood pressure (not hypertension)   RECOMMENDATIONS TO PCP: 1. Follow up lipid panel 2. ECHO incidentally showed liver lesion thought to be liver cyst. Has history of same. Please f/u 3. Please evaluate for hypertension at follow up.  DISCHARGE CONDITION: fair  INITIAL HISTORY: This is a very pleasant 76 year old gentleman who has no significant past medical history who presented to the emergency room on the morning of admission with feeling of heaviness and numbness in the left lower extremity. Patient stated that he was unable to move his leg. At the time of admission he stated that in the supine position his leg feels almost back to normal however he had not tested his condition in a standing position. He denied any other symptoms including chest pain, dizziness, shortness of breath, cough, nausea, vomiting, diarrhea, fever or chills. Of note the patient was in a study with Aspirin or Placebo (?Aspree study which is a double blinded study).  HOSPITAL COURSE:   Acute Stroke in Posterior Right Frontal Lobe Patient's symptoms have almost resolved. Carotid Dopplers show no evidence of hemodynamically significant ICA stenosis. Echocardiogram results are mentioned below. Because he has had a stroke while taking Aspirin, Plavix will be recommended. This will be initiated for him and he will be given a prescription for the same.  Incidental liver lesion on echocardiogram Discussed with patient. He had liver cysts noted on [place] from 2003. He can pursue this further with his PCP.   Lipid panel Lipid panel shows LDL of 93. He may benefit from lower LDL due to stroke. Will initiate low dose Statin. Can follow with PCP.  Discussed with patient and he is agreeable.  Overall patient has improved. He is at this time stable for discharge. He will need followup with his PCP and with the neurologist. Home health will be set up for this individual as recommended by physical and occupational therapy.  PERTINENT LABS: HbA1c was 5.4. HDL was 33.  2D ECHOCARDIOGRAM Study Conclusions  - Left ventricle: The cavity size was normal. Wall thickness was normal. Systolic function was normal. The estimated ejection fraction was in the range of 55% to 60%. Doppler parameters are consistent with abnormal left ventricular relaxation (grade 1 diastolic dysfunction), possibly normal for age. The E/e' ratio is <10, suggesting normal LV filling pressure. - Aortic valve: Sclerosis without stenosis. No regurgitation. - Left atrium: The atrium was normal in size. - Pulmonary arteries: PA peak pressure: 33mm Hg (S). - Systemic veins: The IVC is dilated >2.1 cm, but collapses >50%, suggesting elevated RA pressure of 10 mmHg. - Inferior vena cava: The vessel was normal in size; the respirophasic diameter changes were in the normal range (= 50%); findings are consistent with normal central venous pressure. - Impressions: Incidentally noted round 4.2 x 4.6 cm echolucent structure in the liver which likely represents a hepatic cyst. Recommend dedicated abdominal ultrasound to evalute further if clinically indicated.  IMAGING STUDIES Dg Chest 2 View  12/16/2011  *RADIOLOGY REPORT*  Clinical Data: Shortness of breath, weakness  CHEST - 2 VIEW  Comparison: None.  Findings: The lungs are clear but somewhat hyperaerated possibly indicated COPD.  Mild apical pleural thickening is present.  The heart is within upper limits of normal in size for  age.  The bones are osteopenic.  IMPRESSION: No active lung disease.  Hyperaeration may indicate COPD.   Original Report Authenticated By: Juline Patch, M.D.    Ct Head Wo Contrast  12/16/2011  *RADIOLOGY  REPORT*  Clinical Data: Left leg weakness.  Left leg and foot numbness.  CT HEAD WITHOUT CONTRAST  Technique:  Contiguous axial images were obtained from the base of the skull through the vertex without contrast.  Comparison: None.  Findings: The brain shows generalized atrophy.  There is extensive chronic small vessel disease throughout the brain.  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  There are old small vessel changes affecting the pons.  The cerebral hemispheres show confluent chronic small vessel disease throughout the white matter.  Old small vessel insults effect the thalami and basal ganglia.  No old or acute cortical or large vessel territory infarction is evident.  With respect to the white matter disease, an acute or subacute small vessel infarction could be hidden amongst the extensive chronic changes.  No evidence of mass lesion, hemorrhage, hydrocephalus or extra-axial collection.  No calvarial abnormality.  No inflammatory sinus disease.  There is atherosclerotic calcification of the major vessels at the base of the brain.  IMPRESSION: No identifiably acute finding.  No intracranial hemorrhage. Advanced chronic atrophy and small vessel disease throughout the brain.   Original Report Authenticated By: Thomasenia Sales, M.D.    Mr Brain Wo Contrast  12/18/2011  *RADIOLOGY REPORT*  Clinical Data:  Heaviness and numbness left lower extremities since yesterday.  Hyperlipidemic hypertensive patient.  MRI BRAIN WITHOUT CONTRAST MRA HEAD WITHOUT CONTRAST  Technique: Multiplanar, multiecho pulse sequences of the brain and surrounding structures were obtained according to standard protocol without intravenous contrast.  Angiographic images of the head were obtained using MRA technique without contrast.  Comparison: 12/16/2011 CT.  No comparison MR.  MRI HEAD  Findings:  Acute small non hemorrhagic infarct posterior right frontal lobe involving the medial aspect of the right motor  strip.  Prominent small vessel disease type changes.  Remote small cerebellar infarcts bilaterally.  Tiny areas of blood breakdown products most notable left cerebellum most likely related to prior episodes hemorrhagic ischemia.  Global atrophy with ventricular prominence felt to be related to atrophy rather than hydrocephalus.  No intracranial mass lesion detected on this unenhanced exam.  Cervical spondylotic changes with mild spinal stenosis C2-3 and C3- 4.  Minimal to mild paranasal sinus mucosal thickening.  IMPRESSION: Acute small non hemorrhagic infarct posterior right frontal lobe involving the medial aspect of the right motor strip.  This has been made a PRA call report utilizing dashboard call feature.  MRA HEAD  Findings: Aplastic A1 segment of the right anterior cerebral artery.  Mild irregularity of the right internal carotid artery cavernous and supraclinoid segment.  Mild irregularity M1 segment of the right middle cerebral artery.  Middle cerebral artery mild branch vessel irregularity bilaterally.  Ectatic anterior cerebral artery/anterior communicating artery region.  Small bulge M1 segment left middle cerebral artery and distal M1 segment right middle cerebral artery may represent origin of a vessel although difficult to exclude tiny aneurysm.  Ectatic vertebral arteries with areas of mild narrowing irregularity without high-grade stenosis.  Ectatic basilar artery with mild to moderate narrowing.  Nonvisualization PICAs.  Superior cerebellar artery and posterior cerebral artery branch vessel irregularity bilaterally.  IMPRESSION: Intracranial atherosclerotic type changes as noted above.  Small bulge M1 segment left middle cerebral artery and distal M1 segment  right middle cerebral artery may represent origin of a vessel although difficult to exclude tiny aneurysm.   Original Report Authenticated By: Fuller Canada, M.D.    Mr Mra Head/brain Wo Cm  12/18/2011  *RADIOLOGY REPORT*  Clinical Data:   Heaviness and numbness left lower extremities since yesterday.  Hyperlipidemic hypertensive patient.  MRI BRAIN WITHOUT CONTRAST MRA HEAD WITHOUT CONTRAST  Technique: Multiplanar, multiecho pulse sequences of the brain and surrounding structures were obtained according to standard protocol without intravenous contrast.  Angiographic images of the head were obtained using MRA technique without contrast.  Comparison: 12/16/2011 CT.  No comparison MR.  MRI HEAD  Findings:  Acute small non hemorrhagic infarct posterior right frontal lobe involving the medial aspect of the right motor strip.  Prominent small vessel disease type changes.  Remote small cerebellar infarcts bilaterally.  Tiny areas of blood breakdown products most notable left cerebellum most likely related to prior episodes hemorrhagic ischemia.  Global atrophy with ventricular prominence felt to be related to atrophy rather than hydrocephalus.  No intracranial mass lesion detected on this unenhanced exam.  Cervical spondylotic changes with mild spinal stenosis C2-3 and C3- 4.  Minimal to mild paranasal sinus mucosal thickening.  IMPRESSION: Acute small non hemorrhagic infarct posterior right frontal lobe involving the medial aspect of the right motor strip.  This has been made a PRA call report utilizing dashboard call feature.  MRA HEAD  Findings: Aplastic A1 segment of the right anterior cerebral artery.  Mild irregularity of the right internal carotid artery cavernous and supraclinoid segment.  Mild irregularity M1 segment of the right middle cerebral artery.  Middle cerebral artery mild branch vessel irregularity bilaterally.  Ectatic anterior cerebral artery/anterior communicating artery region.  Small bulge M1 segment left middle cerebral artery and distal M1 segment right middle cerebral artery may represent origin of a vessel although difficult to exclude tiny aneurysm.  Ectatic vertebral arteries with areas of mild narrowing irregularity without  high-grade stenosis.  Ectatic basilar artery with mild to moderate narrowing.  Nonvisualization PICAs.  Superior cerebellar artery and posterior cerebral artery branch vessel irregularity bilaterally.  IMPRESSION: Intracranial atherosclerotic type changes as noted above.  Small bulge M1 segment left middle cerebral artery and distal M1 segment right middle cerebral artery may represent origin of a vessel although difficult to exclude tiny aneurysm.   Original Report Authenticated By: Fuller Canada, M.D.     DISCHARGE EXAMINATION: Blood pressure 131/81, pulse 78, temperature 97.9 F (36.6 C), temperature source Oral, resp. rate 18, height 6\' 2"  (1.88 m), weight 97.523 kg (215 lb), SpO2 98.00%. General appearance: alert, cooperative, appears stated age and no distress Resp: clear to auscultation bilaterally Cardio: regular rate and rhythm, S1, S2 normal, no murmur, click, rub or gallop GI: soft, non-tender; bowel sounds normal; no masses,  no organomegaly Neurologic: Equal strength. No cranial deficits.  DISPOSITION: Home with Home health  Discharge Orders    Future Orders Please Complete By Expires   Diet - low sodium heart healthy      Increase activity slowly      Discharge instructions      Comments:   Be sure to follow up with your PCP. You should hear from the heart doctor's office regarding the heart monitor. Please call [phone] if you dont hear from them by Monday.     Current Discharge Medication List    START taking these medications   Details  clopidogrel (PLAVIX) 75 MG tablet Take 1 tablet (75 mg  total) by mouth daily with breakfast. Qty: 30 tablet, Refills: 3    simvastatin (ZOCOR) 20 MG tablet Take 1 tablet (20 mg total) by mouth daily at 6 PM. Qty: 30 tablet, Refills: 2      CONTINUE these medications which have NOT CHANGED   Details  omeprazole (PRILOSEC) 20 MG capsule Take 20 mg by mouth daily as needed.        STOP taking these medications     aspirin 81 MG  EC tablet        Follow-up Information    Follow up with SETHI,PRAMODKUMAR P, MD. Schedule an appointment as soon as possible for a visit in 1 month. (stroke follow up)    Contact information:   [address] [facility] [place] [address] [phone]       Follow up with Oliver Barre, MD. Schedule an appointment as soon as possible for a visit in 1 week. (post hospital stay)    Contact information:   [address] Maggie Schwalbe [place] [address] [phone]          TOTAL DISCHARGE TIME: 35 mins  [facility]  [facility] Pager [phone]  12/19/2011, 2:04 PM  Patient's discharge was held due to need for TEE. This was done today and was normal. [facility] cardiology office has been called to arrange heart monitor as recommended by Dr. Pearlean Brownie. Patient made aware as well. He is stable for discharge. No change to medications except as outlined above.  Zamire Whitehurst 2:05 PM

## 2011-12-18 NOTE — Progress Notes (Signed)
Physical Therapy Treatment Patient Details Name: Jeffrey Hardy MRN: [id number] DOB: January 31, 1936 Today's Date: 12/18/2011 Time: 0347-4259 PT Time Calculation (min): 24 min  PT Assessment / Plan / Recommendation Comments on Treatment Session  Patientprogresing with goals. Should DC today    Follow Up Recommendations  Home health PT;Supervision for mobility/OOB    Barriers to Discharge        Equipment Recommendations  None recommended by PT    Recommendations for Other Services    Frequency Min 4X/week   Plan Discharge plan remains appropriate;Frequency remains appropriate    Precautions / Restrictions Precautions Precautions: Fall   Pertinent Vitals/Pain     Mobility  Bed Mobility Supine to Sit: 7: Independent Transfers Sit to Stand: 6: Modified independent (Device/Increase time) Stand to Sit: 6: Modified independent (Device/Increase time) Ambulation/Gait Ambulation/Gait Assistance: 5: Supervision Ambulation Distance (Feet): 1300 Feet Assistive device: None;Straight cane Ambulation/Gait Assistance Details: Patient walked half distance with use of cane with no LOB. Patient able to walk other portion of gait without cane without LOB but more antalgic gait Gait Pattern: Step-through pattern;Antalgic General Gait Details: Patient reports old knee injury Stairs: Yes Stairs Assistance: 5: Supervision Stairs Assistance Details (indicate cue type and reason): cues for sequence Stair Management Technique: Step to pattern;With cane Number of Stairs: 10  Modified Rankin (Stroke Patients Only) Pre-Morbid Rankin Score: No symptoms Modified Rankin: Moderate disability    Exercises     PT Diagnosis:    PT Problem List:   PT Treatment Interventions:     PT Goals Acute Rehab PT Goals PT Goal: Sit to Stand - Progress: Met PT Goal: Stand to Sit - Progress: Met PT Goal: Ambulate - Progress: Progressing toward goal PT Goal: Up/Down Stairs - Progress: Progressing toward  goal  Visit Information  Last PT Received On: 12/18/11 Assistance Needed: +1    Subjective Data      Cognition  Overall Cognitive Status: Impaired Area of Impairment: Attention Arousal/Alertness: Awake/alert Orientation Level: Appears intact for tasks assessed Behavior During Session: [facility] for tasks performed Current Attention Level: Selective Cognition - Other Comments: Patient a very good story teller and has difficulty when it is appropraite to focus on task vs. being distracted by talking    Balance     End of Session PT - End of Session Equipment Utilized During Treatment: Gait belt Activity Tolerance: Patient tolerated treatment well Patient left: in chair;with call bell/phone within reach Nurse Communication: Mobility status   GP     Fredrich Birks 12/18/2011, 10:03 AM  12/18/2011 Fredrich Birks PTA [phone] pager [phone] office

## 2011-12-18 NOTE — Evaluation (Signed)
Speech Language Pathology Evaluation Patient Details Name: Jeffrey Hardy MRN: [id number] DOB: [birth year] Today's Date: 12/18/2011 Time: 1012-1026 SLP Time Calculation (min): 14 min  Problem List:  Patient Active Problem List  Diagnosis  . HYPERLIPIDEMIA  . ANXIETY  . DEPRESSION  . HYPERTENSION  . GERD  . PEPTIC ULCER DISEASE  . DIVERTICULOSIS, COLON  . OVERACTIVE BLADDER  . Preventative health care  . Dizziness  . Left knee DJD  . Acute ischemic stroke   Past Medical History:  Past Medical History  Diagnosis Date  . Hypertension   . Erectile dysfunction   . Hyperlipidemia   . Anxiety   . Depression   . GERD (gastroesophageal reflux disease)   . Diverticula, colon   . Syncope     History  . Mitral regurgitation   . Peptic ulcer disease     with Duodenal and Antral ulcers  . H. pylori infection   . HYPERLIPIDEMIA 05/12/2008  . ANXIETY 05/12/2008  . DEPRESSION 05/12/2008  . HYPERTENSION 05/12/2008  . GERD 05/12/2008  . PEPTIC ULCER DISEASE 05/12/2008  . DIVERTICULOSIS, COLON 05/12/2008  . OVERACTIVE BLADDER 05/12/2008   Past Surgical History:  Past Surgical History  Procedure Date  . Appendectomy    HPI:  Patient admitted with left lower extremity weakness. Continues to have some weakness. No speech or swallow deficits and no cognitive deficits. MRI indicates small non-hemorrhagic posterior right temporal lobe infarct.    Assessment / Plan / Recommendation Clinical Impression  Cognitive-linguistic skills appear to be at baseline and WFL at this time. Patient occassionally slow to answer questions with some circumlocution however wife states this is baseline. No further SLP needs indicated at this time. Education complete regarding s/s of CVA and potential for high level cognitive deficits unseen during this eval. OP SLP services is available if needed. Both verbalized understanding.     SLP Assessment  Patient does not need any further Speech Lanaguage Pathology Services    Follow Up Recommendations  Outpatient SLP (if high level deficits are noted after d/c. )    Frequency and Duration        Pertinent Vitals/Pain n/a       SLP Evaluation Prior Functioning  Cognitive/Linguistic Baseline: Within functional limits (occassionally slow to answer; baseline per wife) Type of Home: House Lives With: Spouse Available Help at Discharge: Family;Available 24 hours/day Vocation: Full time employment   Cognition  Overall Cognitive Status: Appears within functional limits for tasks assessed Arousal/Alertness: Awake/alert    Comprehension  Auditory Comprehension Overall Auditory Comprehension: Appears within functional limits for tasks assessed Visual Recognition/Discrimination Discrimination: Within Function Limits Reading Comprehension Reading Status: Not tested    Expression Expression Primary Mode of Expression: Verbal Verbal Expression Overall Verbal Expression: Appears within functional limits for tasks assessed Other Verbal Expression Comments: occassionally slow to answer, wife states this is baseline Written Expression Dominant Hand: Right Written Expression: Not tested   Oral / Motor Oral Motor/Sensory Function Overall Oral Motor/Sensory Function: Appears within functional limits for tasks assessed Motor Speech Overall Motor Speech: Appears within functional limits for tasks assessed   GO   Ferdinand Lango MA, CCC-SLP [phone]   [name] Meryl 12/18/2011, 10:34 AM

## 2011-12-18 NOTE — Progress Notes (Signed)
Occupational Therapy Treatment Patient Details Name: Jeffrey Hardy MRN: [id number] DOB: Dec 14, 1935 Today's Date: 12/18/2011 Time: 6433-2951 OT Time Calculation (min): 46 min  OT Assessment / Plan / Recommendation Comments on Treatment Session Pt. with significant improvements with attention, visual attention, and occulomotor functions today.  Balance also improved.  Pt and wife instructed in activities to improve smoothness and efficiency of pursuits and saccades.  Recommend OP OT at discharge    Follow Up Recommendations  Outpatient OT;Supervision/Assistance - 24 hour    Barriers to Discharge       Equipment Recommendations  None recommended by OT    Recommendations for Other Services    Frequency Min 3X/week   Plan Discharge plan needs to be updated    Precautions / Restrictions Precautions Precautions: Fall   Pertinent Vitals/Pain     ADL  Grooming: Simulated;Wash/dry hands;Wash/dry face;Teeth care;Supervision/safety Where Assessed - Grooming: Supported standing Transfers/Ambulation Related to ADLs: ambulated with supervision ADL Comments: Pt. with posterior frontal infarct.  Spoke with pt and wife re: visual deficits and implications on function.  Pursuits reassessed:  Pt. with improved tracking on Lt. - only lost target x 1, and able to initiate a saccade to continue.  Pt. continues to demonstrate difficulty with saccades bil. however, pt. with improved awareness of this.  Pt. ambulated in hallway today and able to read all signage accurately on both Lt and Rt. while holding a conversation.  Pt. also able to re-direct self to task with no difficulty when he became distracted by visitor in hallway - attention, vision, and balance much improved compared to yesterday    OT Diagnosis:    OT Problem List:   OT Treatment Interventions:     OT Goals ADL Goals ADL Goal: Grooming - Progress: Progressing toward goals ADL Goal: Toilet Transfer - Progress: Progressing toward  goals ADL Goal: Additional Goal #1 - Progress: Progressing toward goals  Visit Information  Last OT Received On: 12/18/11 Assistance Needed: +1    Subjective Data      Prior Functioning  Home Living Lives With: Spouse Available Help at Discharge: Family;Available 24 hours/day Type of Home: House Prior Function Vocation: Full time employment Dominant Hand: Right    Cognition  Overall Cognitive Status: Appears within functional limits for tasks assessed/performed Area of Impairment: Attention Arousal/Alertness: Awake/alert Orientation Level: Appears intact for tasks assessed Behavior During Session: [facility] for tasks performed Current Attention Level: Alternating Attention - Other Comments: significant improvement today Cognition - Other Comments: Pt. able to alternate attention between reading room numbers, talking with therapist, and talking with visitor    Mobility  Shoulder Instructions Bed Mobility Bed Mobility: Supine to Sit Supine to Sit: 7: Independent Transfers Transfers: Sit to Stand;Stand to Sit Sit to Stand: 6: Modified independent (Device/Increase time) Stand to Sit: 6: Modified independent (Device/Increase time)       Exercises      Balance     End of Session OT - End of Session Activity Tolerance: Patient tolerated treatment well Patient left: in chair;with call bell/phone within reach;with family/visitor present  GO     Kaelin Holford M 12/18/2011, 1:35 PM

## 2011-12-18 NOTE — Progress Notes (Signed)
Please see discharge summary for details. Patient's discharge has been held due to need for TEE per neurology. They will arrange. Patient notified.  Jeffrey Hardy 10:57 AM

## 2011-12-18 NOTE — Progress Notes (Signed)
Stroke Team Progress Note  HISTORY Jeffrey Hardy is an 76 y.o. male who currently is enrolled in the "aspree" trial at [facility]. Patient awoke this AM at his baseline. As he was sitting in the kitchen at 7 AM 12/16/2011 he went to stand up and noted his left foot had decreased sensation and he had difficulty moving his left leg. He went about his normal activities until his wife noted he was walking funny and brought him to the hospital. Over time, his left leg symptoms have improved but he still feels his strength has not fully returned to baseline. CT head was negative for acute stroke. Patient was not a TPA candidate secondary to mild symptoms per report. He was admitted for further evaluation and treatment.  SUBJECTIVE No family  is at the bedside.  Overall he feels his condition is rapidly improving.   OBJECTIVE Most recent Vital Signs: Filed Vitals:   12/17/11 1833 12/17/11 2200 12/18/11 0200 12/18/11 0600  BP: 131/60 109/83 123/59 128/73  Pulse: 71 60 60 58  Temp: 98.6 F (37 C) 97.8 F (36.6 C) 97.6 F (36.4 C) 97.8 F (36.6 C)  TempSrc: Oral Oral Oral Oral  Resp: 20 20 20 19   Height:      Weight:      SpO2: 98% 99% 100% 100%   CBG (last 3)   Basename 12/16/11 1008  GLUCAP 106*   Intake/Output from previous day: 09/10 0701 - 09/11 0700 In: 6 [I.V.:6] Out: 750 [Urine:750]  MEDICATIONS     . clopidogrel  75 mg Oral Q breakfast  . enoxaparin  40 mg Subcutaneous q1800  . pantoprazole  40 mg Oral Q1200  . simvastatin  20 mg Oral q1800  . sodium chloride  3 mL Intravenous Q12H  . DISCONTD: aspirin  300 mg Rectal Daily  . DISCONTD: aspirin  325 mg Oral Daily   PRN:  sodium chloride, acetaminophen, acetaminophen, senna-docusate, sodium chloride  Diet:  Cardiac thin liquids Activity:  OOB with assistance DVT Prophylaxis:  Lovenox 40 mg sq daily   CLINICALLY SIGNIFICANT STUDIES Basic Metabolic Panel:   Lab 12/16/11 1647 12/16/11 1015  NA -- 139  K -- 4.3   CL -- 101  CO2 -- 29  GLUCOSE -- 99  BUN -- 20  CREATININE 1.11 1.20  CALCIUM -- 9.6  MG -- --  PHOS -- --   Liver Function Tests:   Lab 12/16/11 1015  AST 19  ALT 18  ALKPHOS 63  BILITOT 0.5  PROT 6.7  ALBUMIN 4.2   CBC:   Lab 12/16/11 1647 12/16/11 1015  WBC 5.8 5.0  NEUTROABS -- 3.1  HGB 15.1 14.5  HCT 43.7 41.6  MCV 94.8 93.9  PLT 138* 134*   Coagulation:   Lab 12/16/11 1015  LABPROT 12.7  INR 0.93   Cardiac Enzymes:   Lab 12/16/11 1015  CKTOTAL 87  CKMB 3.3  CKMBINDEX --  TROPONINI <0.30   Lipid Panel    Component Value Date/Time   CHOL 150 12/17/2011 0510   TRIG 118 12/17/2011 0510   HDL 33* 12/17/2011 0510   CHOLHDL 4.5 12/17/2011 0510   VLDL 24 12/17/2011 0510   LDLCALC 93 12/17/2011 0510   HgbA1C  Lab Results  Component Value Date   HGBA1C 5.4 12/17/2011    Urine Drug Screen:     Component Value Date/Time   LABOPIA NONE DETECTED 12/16/2011 0948   COCAINSCRNUR NONE DETECTED 12/16/2011 0948   LABBENZ NONE DETECTED  12/16/2011 0948   AMPHETMU NONE DETECTED 12/16/2011 0948   THCU NONE DETECTED 12/16/2011 0948   LABBARB NONE DETECTED 12/16/2011 0948    CT of the brain  12/16/2011  No identifiably acute finding.  No intracranial hemorrhage. Advanced chronic atrophy and small vessel disease throughout the brain.     MRI of the brain   Acute small non hemorrhagic infarct posterior right frontal lobe involving the medial aspect of the right motor strip.   MRA of the brain  : Intracranial atherosclerotic type changes as noted above.  Small bulge M1 segment left middle cerebral artery and distal M1 segment right middle cerebral artery may represent origin of a vessel although difficult to exclude tiny aneurysm.     2D Echocardiogram  EF 55-60% with no source of embolus. Incidentally noted round 4.2 x 4.6 cm echolucent structure in the liver which likely represents a hepatic cyst. Recommend dedicated abdominal ultrasound to evalute further if clinically  indicated.  Carotid Doppler  Bilateral: No evidence of hemodynamically significant internal carotid artery stenosis. Vertebral artery flow is antegrade.   CXR  12/16/2011  No active lung disease.  Hyperaeration may indicate COPD  EKG  normal EKG, normal sinus rhythm, unchanged from previous tracings.   Therapy Recommendations PT - OP; OT - OP  Physical Exam   Pleasant elderly Caucasian male currently not in distress.Awake alert. Afebrile. Head is nontraumatic. Neck is supple without bruit. Hearing is normal. Cardiac exam no murmur or gallop. Lungs are clear to auscultation. Distal pulses are well felt.  Neurological Exam ; Awake  Alert oriented x 3. Normal speech and language.eye movements full without nystagmus. Face symmetric. Tongue midline. Normal strength, tone, reflexes and coordination. Normal sensation. Gait deferred. ASSESSMENT [name] is a 76 y.o. male presenting with left hemiparesis and left hemisensory deficit. Imaging reveals a posterior right frontal lobe involving the medial aspect of the right motor strip. Infarct felt to be embolic secondary to likely cardioembolic source. Needs TEE and OP tele monitoring to look for source. On aspirin 81 mg orally every day prior to admission. Now on aspirin 325 mg orally every day for secondary stroke prevention. Patient with resultant left hemiparesis and left hemisensory deficit, which is improving.   Hypertension  Hyperlipidemia, LDL 93, not on statin  HgbA1c 5.4  Anxiety  depression  Hospital day # 2  TREATMENT/PLAN  As patient already on aspirin PTA, change to clopidogrel 75 mg orally every day for secondary stroke prevention. TEE to look for embolic source. Arranged with [facility] for 12/19/2011. Will need to be NPO after midnight. If positive for PFO (patent foramen ovale), check bilateral lower extremity venous dopplers to rule out DVT as possible source of stroke.  Please schedule outpatient telemetry  monitoring to assess patient for atrial fibrillation as source of stroke. May be arranged with patient's cardiologist, or cardiologist of choice.  OP  PT and OT  Annie Main, MSN, RN, ANVP-BC, ANP-BC, GNP-BC [facility] Pager: [phone] 12/18/2011 10:26 AM  Scribe for Dr. Delia Heady, Stroke Center Medical Director, who has personally reviewed chart, pertinent data, examined the patient and developed the plan of care. Pager:  [phone]

## 2011-12-19 ENCOUNTER — Encounter (HOSPITAL_COMMUNITY): Payer: Self-pay | Admitting: *Deleted

## 2011-12-19 ENCOUNTER — Encounter (HOSPITAL_COMMUNITY)
Admission: EM | Disposition: A | Discharge status: Home Health | Payer: Self-pay | Source: Home / Self Care | Attending: Internal Medicine

## 2011-12-19 DIAGNOSIS — R030 Elevated blood-pressure reading, without diagnosis of hypertension: Secondary | ICD-10-CM

## 2011-12-19 DIAGNOSIS — K219 Gastro-esophageal reflux disease without esophagitis: Secondary | ICD-10-CM

## 2011-12-19 DIAGNOSIS — I6789 Other cerebrovascular disease: Secondary | ICD-10-CM

## 2011-12-19 HISTORY — PX: TEE WITHOUT CARDIOVERSION: SHX5443

## 2011-12-19 SURGERY — ECHOCARDIOGRAM, TRANSESOPHAGEAL
Anesthesia: Moderate Sedation

## 2011-12-19 MED ORDER — LIDOCAINE VISCOUS 2 % MT SOLN
OROMUCOSAL | Status: DC | PRN
  Administered 2011-12-19: 10 mL via OROMUCOSAL

## 2011-12-19 MED ORDER — MIDAZOLAM HCL 10 MG/2ML IJ SOLN
INTRAMUSCULAR | Status: DC | PRN
  Administered 2011-12-19: 2 mg via INTRAVENOUS
  Administered 2011-12-19: 1 mg via INTRAVENOUS
  Administered 2011-12-19 (×2): 2 mg via INTRAVENOUS

## 2011-12-19 MED ORDER — FENTANYL CITRATE 0.05 MG/ML IJ SOLN
INTRAMUSCULAR | Status: DC | PRN
  Administered 2011-12-19 (×2): 25 ug via INTRAVENOUS

## 2011-12-19 MED ORDER — FENTANYL CITRATE 0.05 MG/ML IJ SOLN
INTRAMUSCULAR | Status: AC
  Filled 2011-12-19: qty 2

## 2011-12-19 MED ORDER — MIDAZOLAM HCL 5 MG/ML IJ SOLN
INTRAMUSCULAR | Status: AC
  Filled 2011-12-19: qty 2

## 2011-12-19 NOTE — Progress Notes (Signed)
 PCP: [name], MD  Brief HPI: This is a very pleasant 75-year-old gentleman who has no significant past medical history who presented to the emergency room on the morning of admission with feeling of heaviness and numbness in the left lower extremity. Patient stated that he was unable to move his leg. At the time of admission he stated that in the supine position his leg feels almost back to normal however he had not tested his condition in a standing position. He denied any other symptoms including chest pain, dizziness, shortness of breath, cough, nausea, vomiting, diarrhea, fever or chills. Of note the patient was in a study with Aspirin or Placebo (?Aspree study which is a double blinded study).  Past medical history:  Past Medical History  Diagnosis Date  . Hypertension   . Erectile dysfunction   . Hyperlipidemia   . Anxiety   . Depression   . GERD (gastroesophageal reflux disease)   . Diverticula, colon   . Syncope     History  . Mitral regurgitation   . Peptic ulcer disease     with Duodenal and Antral ulcers  . H. pylori infection   . HYPERLIPIDEMIA 05/12/2008  . ANXIETY 05/12/2008  . DEPRESSION 05/12/2008  . HYPERTENSION 05/12/2008  . GERD 05/12/2008  . PEPTIC ULCER DISEASE 05/12/2008  . DIVERTICULOSIS, COLON 05/12/2008  . OVERACTIVE BLADDER 05/12/2008    Consultants: Neurology  Procedures: TEE scheduled for 9/12  Subjective: Patient feels well. Denies any complaints. Strength is better.  Objective: Vital signs in last 24 hours: Temp:  [97.2 F (36.2 C)-98.1 F (36.7 C)] 98.1 F (36.7 C) (09/12 0600) Pulse Rate:  [58-67] 67  (09/12 0600) Resp:  [20] 20  (09/12 0600) BP: (132-151)/(69-98) 140/98 mmHg (09/12 0600) SpO2:  [97 %-100 %] 100 % (09/12 0600) Weight change:  Last BM Date: 12/16/11  Intake/Output from previous day:   Intake/Output this shift:    General appearance: alert, cooperative, appears stated age and no distress Head: Normocephalic, without obvious  abnormality, atraumatic Resp: clear to auscultation bilaterally Cardio: regular rate and rhythm, S1, S2 normal, no murmur, click, rub or gallop GI: soft, non-tender; bowel sounds normal; no masses,  no organomegaly Extremities: extremities normal, atraumatic, no cyanosis or edema Neurologic: Alert and oriented x 3. No cranial deficits. Equal strength b/l  Lab Results:  Basename 12/16/11 1647 12/16/11 1015  WBC 5.8 5.0  HGB 15.1 14.5  HCT 43.7 41.6  PLT 138* 134*   BMET  Basename 12/16/11 1647 12/16/11 1015  NA -- 139  K -- 4.3  CL -- 101  CO2 -- 29  GLUCOSE -- 99  BUN -- 20  CREATININE 1.11 1.20  CALCIUM -- 9.6  ALT -- 18    Studies/Results: Mr Brain Wo Contrast  12/18/2011  *RADIOLOGY REPORT*  Clinical Data:  Heaviness and numbness left lower extremities since yesterday.  Hyperlipidemic hypertensive patient.  MRI BRAIN WITHOUT CONTRAST MRA HEAD WITHOUT CONTRAST  Technique: Multiplanar, multiecho pulse sequences of the brain and surrounding structures were obtained according to standard protocol without intravenous contrast.  Angiographic images of the head were obtained using MRA technique without contrast.  Comparison: 12/16/2011 CT.  No comparison MR.  MRI HEAD  Findings:  Acute small non hemorrhagic infarct posterior right frontal lobe involving the medial aspect of the right motor strip.  Prominent small vessel disease type changes.  Remote small cerebellar infarcts bilaterally.  Tiny areas of blood breakdown products most notable left cerebellum most likely related to   prior episodes hemorrhagic ischemia.  Global atrophy with ventricular prominence felt to be related to atrophy rather than hydrocephalus.  No intracranial mass lesion detected on this unenhanced exam.  Cervical spondylotic changes with mild spinal stenosis C2-3 and C3- 4.  Minimal to mild paranasal sinus mucosal thickening.  IMPRESSION: Acute small non hemorrhagic infarct posterior right frontal lobe involving the  medial aspect of the right motor strip.  This has been made a PRA call report utilizing dashboard call feature.  MRA HEAD  Findings: Aplastic A1 segment of the right anterior cerebral artery.  Mild irregularity of the right internal carotid artery cavernous and supraclinoid segment.  Mild irregularity M1 segment of the right middle cerebral artery.  Middle cerebral artery mild branch vessel irregularity bilaterally.  Ectatic anterior cerebral artery/anterior communicating artery region.  Small bulge M1 segment left middle cerebral artery and distal M1 segment right middle cerebral artery may represent origin of a vessel although difficult to exclude tiny aneurysm.  Ectatic vertebral arteries with areas of mild narrowing irregularity without high-grade stenosis.  Ectatic basilar artery with mild to moderate narrowing.  Nonvisualization PICAs.  Superior cerebellar artery and posterior cerebral artery branch vessel irregularity bilaterally.  IMPRESSION: Intracranial atherosclerotic type changes as noted above.  Small bulge M1 segment left middle cerebral artery and distal M1 segment right middle cerebral artery may represent origin of a vessel although difficult to exclude tiny aneurysm.   Original Report Authenticated By: STEVEN R. OLSON, M.D.    Mr Mra Head/brain Wo Cm  12/18/2011  *RADIOLOGY REPORT*  Clinical Data:  Heaviness and numbness left lower extremities since yesterday.  Hyperlipidemic hypertensive patient.  MRI BRAIN WITHOUT CONTRAST MRA HEAD WITHOUT CONTRAST  Technique: Multiplanar, multiecho pulse sequences of the brain and surrounding structures were obtained according to standard protocol without intravenous contrast.  Angiographic images of the head were obtained using MRA technique without contrast.  Comparison: 12/16/2011 CT.  No comparison MR.  MRI HEAD  Findings:  Acute small non hemorrhagic infarct posterior right frontal lobe involving the medial aspect of the right motor strip.  Prominent  small vessel disease type changes.  Remote small cerebellar infarcts bilaterally.  Tiny areas of blood breakdown products most notable left cerebellum most likely related to prior episodes hemorrhagic ischemia.  Global atrophy with ventricular prominence felt to be related to atrophy rather than hydrocephalus.  No intracranial mass lesion detected on this unenhanced exam.  Cervical spondylotic changes with mild spinal stenosis C2-3 and C3- 4.  Minimal to mild paranasal sinus mucosal thickening.  IMPRESSION: Acute small non hemorrhagic infarct posterior right frontal lobe involving the medial aspect of the right motor strip.  This has been made a PRA call report utilizing dashboard call feature.  MRA HEAD  Findings: Aplastic A1 segment of the right anterior cerebral artery.  Mild irregularity of the right internal carotid artery cavernous and supraclinoid segment.  Mild irregularity M1 segment of the right middle cerebral artery.  Middle cerebral artery mild branch vessel irregularity bilaterally.  Ectatic anterior cerebral artery/anterior communicating artery region.  Small bulge M1 segment left middle cerebral artery and distal M1 segment right middle cerebral artery may represent origin of a vessel although difficult to exclude tiny aneurysm.  Ectatic vertebral arteries with areas of mild narrowing irregularity without high-grade stenosis.  Ectatic basilar artery with mild to moderate narrowing.  Nonvisualization PICAs.  Superior cerebellar artery and posterior cerebral artery branch vessel irregularity bilaterally.  IMPRESSION: Intracranial atherosclerotic type changes as noted above.  Small bulge   M1 segment left middle cerebral artery and distal M1 segment right middle cerebral artery may represent origin of a vessel although difficult to exclude tiny aneurysm.   Original Report Authenticated By: STEVEN R. OLSON, M.D.     Medications:  Scheduled:   . clopidogrel  75 mg Oral Q breakfast  . enoxaparin  40  mg Subcutaneous q1800  . pantoprazole  40 mg Oral Q1200  . simvastatin  20 mg Oral q1800  . sodium chloride  3 mL Intravenous Q12H  . DISCONTD: aspirin  300 mg Rectal Daily  . DISCONTD: aspirin  325 mg Oral Daily   Continuous:  PRN:sodium chloride, acetaminophen, acetaminophen, senna-docusate, sodium chloride  Assessment/Plan:  Principal Problem:  *Acute ischemic stroke Active Problems:  GERD    Acute Stroke in Posterior Right Frontal Lobe Patient's symptoms have almost resolved. Carotid Dopplers show no evidence of hemodynamically significant ICA stenosis. Echocardiogram report reviewed. Plan is for TEE today. If TEE is unremarkable, patient could be discharged home.  Incidental liver lesion on echocardiogram  Discussed with patient. He had liver cysts noted on US from 2003. He can pursue this further with his PCP.   Lipid panel  Lipid panel shows LDL of 93. He may benefit from lower LDL due to stroke. Will initiate low dose Statin. Can follow with PCP. Discussed with patient and he is agreeable.  Elevated BP This will need close follow up with his PCP. Hesitate to initiate antihypertensive medications considering stroke.  Code Status Full Code  DVT Prophylaxis Enoxaparin  Disposition Possible discharge later today depending on TEE   LOS: 3 days   Carnita Golob  [facility] Pager [phone] 12/19/2011, 7:31 AM    

## 2011-12-19 NOTE — Progress Notes (Signed)
Stroke Team Progress Note  HISTORY Jeffrey Hardy is an 76 y.o. male who currently is enrolled in the "aspree" trial at [facility]. Patient awoke this AM at his baseline. As he was sitting in the kitchen at 7 AM 12/16/2011 he went to stand up and noted his left foot had decreased sensation and he had difficulty moving his left leg. He went about his normal activities until his wife noted he was walking funny and brought him to the hospital. Over time, his left leg symptoms have improved but he still feels his strength has not fully returned to baseline. CT head was negative for acute stroke. Patient was not a TPA candidate secondary to mild symptoms per report. He was admitted for further evaluation and treatment.  SUBJECTIVE  Just returned from TEE which was apparently normal. Wants to go home. No complaints. OBJECTIVE Most recent Vital Signs: Filed Vitals:   12/18/11 1844 12/18/11 2200 12/19/11 0200 12/19/11 0600  BP: 132/72 143/83 151/88 140/98  Pulse: 64 60 58 67  Temp: 97.9 F (36.6 C) 97.2 F (36.2 C) 97.8 F (36.6 C) 98.1 F (36.7 C)  TempSrc: Oral Oral Oral Oral  Resp: 20 20 20 20   Height:      Weight:      SpO2: 100% 99% 100% 100%   MEDICATIONS    . clopidogrel  75 mg Oral Q breakfast  . enoxaparin  40 mg Subcutaneous q1800  . pantoprazole  40 mg Oral Q1200  . simvastatin  20 mg Oral q1800  . sodium chloride  3 mL Intravenous Q12H  . DISCONTD: aspirin  300 mg Rectal Daily  . DISCONTD: aspirin  325 mg Oral Daily   PRN:  sodium chloride, acetaminophen, acetaminophen, senna-docusate, sodium chloride  Diet:  NPO Activity:  OOB with assistance DVT Prophylaxis:  Lovenox 40 mg sq daily   CLINICALLY SIGNIFICANT STUDIES Basic Metabolic Panel:   Lab 12/16/11 1647 12/16/11 1015  NA -- 139  K -- 4.3  CL -- 101  CO2 -- 29  GLUCOSE -- 99  BUN -- 20  CREATININE 1.11 1.20  CALCIUM -- 9.6  MG -- --  PHOS -- --   Liver Function Tests:   Lab 12/16/11 1015  AST 19   ALT 18  ALKPHOS 63  BILITOT 0.5  PROT 6.7  ALBUMIN 4.2   CBC:   Lab 12/16/11 1647 12/16/11 1015  WBC 5.8 5.0  NEUTROABS -- 3.1  HGB 15.1 14.5  HCT 43.7 41.6  MCV 94.8 93.9  PLT 138* 134*   Coagulation:   Lab 12/16/11 1015  LABPROT 12.7  INR 0.93   Cardiac Enzymes:   Lab 12/16/11 1015  CKTOTAL 87  CKMB 3.3  CKMBINDEX --  TROPONINI <0.30   Lipid Panel    Component Value Date/Time   CHOL 150 12/17/2011 0510   TRIG 118 12/17/2011 0510   HDL 33* 12/17/2011 0510   CHOLHDL 4.5 12/17/2011 0510   VLDL 24 12/17/2011 0510   LDLCALC 93 12/17/2011 0510   HgbA1C  Lab Results  Component Value Date   HGBA1C 5.4 12/17/2011    Urine Drug Screen:     Component Value Date/Time   LABOPIA NONE DETECTED 12/16/2011 0948   COCAINSCRNUR NONE DETECTED 12/16/2011 0948   LABBENZ NONE DETECTED 12/16/2011 0948   AMPHETMU NONE DETECTED 12/16/2011 0948   THCU NONE DETECTED 12/16/2011 0948   LABBARB NONE DETECTED 12/16/2011 0948    CT of the brain  12/16/2011  No identifiably  acute finding.  No intracranial hemorrhage. Advanced chronic atrophy and small vessel disease throughout the brain.     MRI of the brain   Acute small non hemorrhagic infarct posterior right frontal lobe involving the medial aspect of the right motor strip.   MRA of the brain  : Intracranial atherosclerotic type changes as noted above.  Small bulge M1 segment left middle cerebral artery and distal M1 segment right middle cerebral artery may represent origin of a vessel although difficult to exclude tiny aneurysm.     2D Echocardiogram  EF 55-60% with no source of embolus. Incidentally noted round 4.2 x 4.6 cm echolucent structure in the liver which likely represents a hepatic cyst. Recommend dedicated abdominal ultrasound to evalute further if clinically indicated.  Carotid Doppler  Bilateral: No evidence of hemodynamically significant internal carotid artery stenosis. Vertebral artery flow is antegrade.   CXR  12/16/2011  No active  lung disease.  Hyperaeration may indicate COPD  EKG  normal EKG, normal sinus rhythm, unchanged from previous tracings.   TEE  No source of embolus  Therapy Recommendations PT - OP; OT - OP  Physical Exam   Pleasant elderly Caucasian male currently not in distress.Awake alert. Afebrile. Head is nontraumatic. Neck is supple without bruit. Hearing is normal. Cardiac exam no murmur or gallop. Lungs are clear to auscultation. Distal pulses are well felt.  Neurological Exam ; Awake  Alert oriented x 3. Normal speech and language.eye movements full without nystagmus. Face symmetric. Tongue midline. Normal strength, tone, reflexes and coordination. Normal sensation. Gait deferred.  ASSESSMENT [name] is a 76 y.o. male presenting with left hemiparesis and left hemisensory deficit. Imaging reveals a posterior right frontal lobe involving the medial aspect of the right motor strip. Infarct felt to be embolic secondary to likely cardioembolic source.  TEE completed, negative per verbal report to RN. OP tele monitoring needed to look for source. On aspirin 81 mg orally every day prior to admission. Now on clopidogrel 75 mg orally every day for secondary stroke prevention. Patient with resultant left hemiparesis and left hemisensory deficit, which is improving.   Hypertension  Hyperlipidemia, LDL 93, not on statin  HgbA1c 5.4  Anxiety  depression  Hospital day # 3  TREATMENT/PLAN Please schedule outpatient telemetry monitoring to assess patient for atrial fibrillation as source of stroke. May be arranged with patient's cardiologist, or cardiologist of choice.  OP  PT and OT Stroke Service will sign off. Follow up with Dr. Pearlean Brownie, Stroke Clinic, in 2 months.  Annie Main, MSN, RN, ANVP-BC, ANP-BC, [facility] Pager: [phone] 12/19/2011 9:01 AM  Scribe for Dr. Delia Heady, Stroke Center Medical Director, who has personally reviewed chart, pertinent data,  examined the patient and developed the plan of care. Pager:  [phone]

## 2011-12-19 NOTE — H&P (View-Only) (Signed)
PCP: Oliver Barre, MD  Brief HPI: This is a very pleasant 76 year old gentleman who has no significant past medical history who presented to the emergency room on the morning of admission with feeling of heaviness and numbness in the left lower extremity. Patient stated that he was unable to move his leg. At the time of admission he stated that in the supine position his leg feels almost back to normal however he had not tested his condition in a standing position. He denied any other symptoms including chest pain, dizziness, shortness of breath, cough, nausea, vomiting, diarrhea, fever or chills. Of note the patient was in a study with Aspirin or Placebo (?Aspree study which is a double blinded study).  Past medical history:  Past Medical History  Diagnosis Date  . Hypertension   . Erectile dysfunction   . Hyperlipidemia   . Anxiety   . Depression   . GERD (gastroesophageal reflux disease)   . Diverticula, colon   . Syncope     History  . Mitral regurgitation   . Peptic ulcer disease     with Duodenal and Antral ulcers  . H. pylori infection   . HYPERLIPIDEMIA 05/12/2008  . ANXIETY 05/12/2008  . DEPRESSION 05/12/2008  . HYPERTENSION 05/12/2008  . GERD 05/12/2008  . PEPTIC ULCER DISEASE 05/12/2008  . DIVERTICULOSIS, COLON 05/12/2008  . OVERACTIVE BLADDER 05/12/2008    Consultants: Neurology  Procedures: TEE scheduled for 9/12  Subjective: Patient feels well. Denies any complaints. Strength is better.  Objective: Vital signs in last 24 hours: Temp:  [97.2 F (36.2 C)-98.1 F (36.7 C)] 98.1 F (36.7 C) (09/12 0600) Pulse Rate:  [58-67] 67  (09/12 0600) Resp:  [20] 20  (09/12 0600) BP: (132-151)/(69-98) 140/98 mmHg (09/12 0600) SpO2:  [97 %-100 %] 100 % (09/12 0600) Weight change:  Last BM Date: 12/16/11  Intake/Output from previous day:   Intake/Output this shift:    General appearance: alert, cooperative, appears stated age and no distress Head: Normocephalic, without obvious  abnormality, atraumatic Resp: clear to auscultation bilaterally Cardio: regular rate and rhythm, S1, S2 normal, no murmur, click, rub or gallop GI: soft, non-tender; bowel sounds normal; no masses,  no organomegaly Extremities: extremities normal, atraumatic, no cyanosis or edema Neurologic: Alert and oriented x 3. No cranial deficits. Equal strength b/l  Lab Results:  Basename 12/16/11 1647 12/16/11 1015  WBC 5.8 5.0  HGB 15.1 14.5  HCT 43.7 41.6  PLT 138* 134*   BMET  Basename 12/16/11 1647 12/16/11 1015  NA -- 139  K -- 4.3  CL -- 101  CO2 -- 29  GLUCOSE -- 99  BUN -- 20  CREATININE 1.11 1.20  CALCIUM -- 9.6  ALT -- 18    Studies/Results: Mr Brain Wo Contrast  12/18/2011  *RADIOLOGY REPORT*  Clinical Data:  Heaviness and numbness left lower extremities since yesterday.  Hyperlipidemic hypertensive patient.  MRI BRAIN WITHOUT CONTRAST MRA HEAD WITHOUT CONTRAST  Technique: Multiplanar, multiecho pulse sequences of the brain and surrounding structures were obtained according to standard protocol without intravenous contrast.  Angiographic images of the head were obtained using MRA technique without contrast.  Comparison: 12/16/2011 CT.  No comparison MR.  MRI HEAD  Findings:  Acute small non hemorrhagic infarct posterior right frontal lobe involving the medial aspect of the right motor strip.  Prominent small vessel disease type changes.  Remote small cerebellar infarcts bilaterally.  Tiny areas of blood breakdown products most notable left cerebellum most likely related to  prior episodes hemorrhagic ischemia.  Global atrophy with ventricular prominence felt to be related to atrophy rather than hydrocephalus.  No intracranial mass lesion detected on this unenhanced exam.  Cervical spondylotic changes with mild spinal stenosis C2-3 and C3- 4.  Minimal to mild paranasal sinus mucosal thickening.  IMPRESSION: Acute small non hemorrhagic infarct posterior right frontal lobe involving the  medial aspect of the right motor strip.  This has been made a PRA call report utilizing dashboard call feature.  MRA HEAD  Findings: Aplastic A1 segment of the right anterior cerebral artery.  Mild irregularity of the right internal carotid artery cavernous and supraclinoid segment.  Mild irregularity M1 segment of the right middle cerebral artery.  Middle cerebral artery mild branch vessel irregularity bilaterally.  Ectatic anterior cerebral artery/anterior communicating artery region.  Small bulge M1 segment left middle cerebral artery and distal M1 segment right middle cerebral artery may represent origin of a vessel although difficult to exclude tiny aneurysm.  Ectatic vertebral arteries with areas of mild narrowing irregularity without high-grade stenosis.  Ectatic basilar artery with mild to moderate narrowing.  Nonvisualization PICAs.  Superior cerebellar artery and posterior cerebral artery branch vessel irregularity bilaterally.  IMPRESSION: Intracranial atherosclerotic type changes as noted above.  Small bulge M1 segment left middle cerebral artery and distal M1 segment right middle cerebral artery may represent origin of a vessel although difficult to exclude tiny aneurysm.   Original Report Authenticated By: Fuller Canada, M.D.    Mr Mra Head/brain Wo Cm  12/18/2011  *RADIOLOGY REPORT*  Clinical Data:  Heaviness and numbness left lower extremities since yesterday.  Hyperlipidemic hypertensive patient.  MRI BRAIN WITHOUT CONTRAST MRA HEAD WITHOUT CONTRAST  Technique: Multiplanar, multiecho pulse sequences of the brain and surrounding structures were obtained according to standard protocol without intravenous contrast.  Angiographic images of the head were obtained using MRA technique without contrast.  Comparison: 12/16/2011 CT.  No comparison MR.  MRI HEAD  Findings:  Acute small non hemorrhagic infarct posterior right frontal lobe involving the medial aspect of the right motor strip.  Prominent  small vessel disease type changes.  Remote small cerebellar infarcts bilaterally.  Tiny areas of blood breakdown products most notable left cerebellum most likely related to prior episodes hemorrhagic ischemia.  Global atrophy with ventricular prominence felt to be related to atrophy rather than hydrocephalus.  No intracranial mass lesion detected on this unenhanced exam.  Cervical spondylotic changes with mild spinal stenosis C2-3 and C3- 4.  Minimal to mild paranasal sinus mucosal thickening.  IMPRESSION: Acute small non hemorrhagic infarct posterior right frontal lobe involving the medial aspect of the right motor strip.  This has been made a PRA call report utilizing dashboard call feature.  MRA HEAD  Findings: Aplastic A1 segment of the right anterior cerebral artery.  Mild irregularity of the right internal carotid artery cavernous and supraclinoid segment.  Mild irregularity M1 segment of the right middle cerebral artery.  Middle cerebral artery mild branch vessel irregularity bilaterally.  Ectatic anterior cerebral artery/anterior communicating artery region.  Small bulge M1 segment left middle cerebral artery and distal M1 segment right middle cerebral artery may represent origin of a vessel although difficult to exclude tiny aneurysm.  Ectatic vertebral arteries with areas of mild narrowing irregularity without high-grade stenosis.  Ectatic basilar artery with mild to moderate narrowing.  Nonvisualization PICAs.  Superior cerebellar artery and posterior cerebral artery branch vessel irregularity bilaterally.  IMPRESSION: Intracranial atherosclerotic type changes as noted above.  Small bulge  M1 segment left middle cerebral artery and distal M1 segment right middle cerebral artery may represent origin of a vessel although difficult to exclude tiny aneurysm.   Original Report Authenticated By: Fuller Canada, M.D.     Medications:  Scheduled:   . clopidogrel  75 mg Oral Q breakfast  . enoxaparin  40  mg Subcutaneous q1800  . pantoprazole  40 mg Oral Q1200  . simvastatin  20 mg Oral q1800  . sodium chloride  3 mL Intravenous Q12H  . DISCONTD: aspirin  300 mg Rectal Daily  . DISCONTD: aspirin  325 mg Oral Daily   Continuous:  [id number] chloride, acetaminophen, acetaminophen, senna-docusate, sodium chloride  Assessment/Plan:  Principal Problem:  *Acute ischemic stroke Active Problems:  GERD    Acute Stroke in Posterior Right Frontal Lobe Patient's symptoms have almost resolved. Carotid Dopplers show no evidence of hemodynamically significant ICA stenosis. Echocardiogram report reviewed. Plan is for TEE today. If TEE is unremarkable, patient could be discharged home.  Incidental liver lesion on echocardiogram  Discussed with patient. He had liver cysts noted on [place] from 2003. He can pursue this further with his PCP.   Lipid panel  Lipid panel shows LDL of 93. He may benefit from lower LDL due to stroke. Will initiate low dose Statin. Can follow with PCP. Discussed with patient and he is agreeable.  Elevated BP This will need close follow up with his PCP. Hesitate to initiate antihypertensive medications considering stroke.  Code Status Full Code  DVT Prophylaxis Enoxaparin  Disposition Possible discharge later today depending on TEE   LOS: 3 days   [facility]  [facility] Pager [phone] 12/19/2011, 7:31 AM

## 2011-12-19 NOTE — Interval H&P Note (Signed)
History and Physical Interval Note:  12/19/2011 10:13 AM  Jeffrey Hardy  has presented today for surgery, with the diagnosis of cva  The various methods of treatment have been discussed with the patient and family. After consideration of risks, benefits and other options for treatment, the patient has consented to  Procedure(s) (LRB) with comments: TRANSESOPHAGEAL ECHOCARDIOGRAM (TEE) (N/A) as a surgical intervention .  The patient's history has been reviewed, patient examined, no change in status, stable for surgery.  I have reviewed the patient's chart and labs.  Questions were answered to the patient's satisfaction.     Dietrich Pates

## 2011-12-19 NOTE — Progress Notes (Signed)
  Echocardiogram Echocardiogram Transesophageal has been performed.  Jeffrey Hardy [name] 12/19/2011, 10:44 AM

## 2011-12-19 NOTE — Op Note (Signed)
Full report to follow 

## 2011-12-19 NOTE — Progress Notes (Signed)
PT Cancellation Note  Treatment cancelled today due to patient receiving procedure or test. Pt on his way down for a TEE. Will follow up today as time allows.  Sallyanne Kuster 12/19/2011, 9:54 AM  Sallyanne Kuster, PTA Office- [phone]

## 2011-12-19 NOTE — Progress Notes (Signed)
Pt's assessment unchanged from AM. Pt and wife given D/C instructions, stroke education, & Rx's. Both Pt and wife verbalized understanding of teaching. Pt D/C'd home via wheelchair @ 1445 per MD order. Rema Fendt, RN

## 2011-12-20 ENCOUNTER — Encounter (HOSPITAL_COMMUNITY): Payer: Self-pay | Admitting: Internal Medicine

## 2011-12-20 ENCOUNTER — Encounter (HOSPITAL_COMMUNITY): Payer: Self-pay

## 2011-12-24 ENCOUNTER — Encounter (INDEPENDENT_AMBULATORY_CARE_PROVIDER_SITE_OTHER): Payer: Medicare Other

## 2011-12-24 DIAGNOSIS — I4891 Unspecified atrial fibrillation: Secondary | ICD-10-CM

## 2011-12-26 ENCOUNTER — Other Ambulatory Visit (INDEPENDENT_AMBULATORY_CARE_PROVIDER_SITE_OTHER): Payer: Medicare Other

## 2011-12-26 ENCOUNTER — Encounter: Payer: Self-pay | Admitting: Internal Medicine

## 2011-12-26 ENCOUNTER — Ambulatory Visit (INDEPENDENT_AMBULATORY_CARE_PROVIDER_SITE_OTHER): Payer: Medicare Other | Admitting: Internal Medicine

## 2011-12-26 VITALS — BP 114/68 | HR 65 | Temp 97.9°F | Ht 73.0 in | Wt 210.1 lb

## 2011-12-26 DIAGNOSIS — Z23 Encounter for immunization: Secondary | ICD-10-CM

## 2011-12-26 DIAGNOSIS — Z79899 Other long term (current) drug therapy: Secondary | ICD-10-CM

## 2011-12-26 DIAGNOSIS — Z0000 Encounter for general adult medical examination without abnormal findings: Secondary | ICD-10-CM

## 2011-12-26 DIAGNOSIS — Z125 Encounter for screening for malignant neoplasm of prostate: Secondary | ICD-10-CM

## 2011-12-26 LAB — PSA: PSA: 0.56 ng/mL (ref 0.10–4.00)

## 2011-12-26 LAB — TSH: TSH: 1.08 u[IU]/mL (ref 0.35–5.50)

## 2011-12-26 NOTE — Patient Instructions (Addendum)
You had the flu shot today Continue all other medications as before Please keep your appointments with your specialists as you have planned - Dr Pearlean Brownie Please go to LAB in the Basement for the blood and/or urine tests to be done today You will be contacted by phone if any changes need to be made immediately.  Otherwise, you will receive a letter about your results with an explanation. Please remember to sign up for My Chart at your earliest convenience, as this will be important to you in the future with finding out test results. Please return in 1 year for your yearly visit, or sooner if needed, with Lab testing done 3-5 days before

## 2011-12-26 NOTE — Progress Notes (Signed)
Subjective:    Patient ID: Jeffrey Hardy, male    DOB: November 16, 1935, 76 y.o.   MRN: [id number]  HPI  Here with wife;    Currently on 3 wk event monitor, and now on plavix, not clear on the Esprit study if he was taking asa or not with that study.   Due for PSA. Today. Here for wellness and f/u;  Overall doing ok;  Pt denies CP, worsening SOB, DOE, wheezing, orthopnea, PND, worsening LE edema, palpitations, dizziness or syncope.  Pt denies neurological change such as new Headache, facial or extremity weakness.  Pt denies polydipsia, polyuria, or low sugar symptoms. Pt states overall good compliance with treatment and medications, good tolerability, and trying to follow lower cholesterol diet.  Pt denies worsening depressive symptoms, suicidal ideation or panic. No fever, wt loss, night sweats, loss of appetite, or other constitutional symptoms.  Pt states good ability with ADL's, low fall risk, home safety reviewed and adequate, no significant changes in hearing or vision, and occasionally active with exercise.  Had recent small CVA, Overall good compliance with treatment, and good medicine tolerability. Has neuro f/u planned, now on plavix.  Non smoker, BP ok, a1c normal, on new statin for LDL 93 Past Medical History  Diagnosis Date  . Hypertension   . Erectile dysfunction   . Hyperlipidemia   . Anxiety   . Depression   . GERD (gastroesophageal reflux disease)   . Diverticula, colon   . Syncope     History  . Mitral regurgitation   . Peptic ulcer disease     with Duodenal and Antral ulcers  . H. pylori infection   . HYPERLIPIDEMIA 05/12/2008  . ANXIETY 05/12/2008  . DEPRESSION 05/12/2008  . HYPERTENSION 05/12/2008  . GERD 05/12/2008  . PEPTIC ULCER DISEASE 05/12/2008  . DIVERTICULOSIS, COLON 05/12/2008  . OVERACTIVE BLADDER 05/12/2008   Past Surgical History  Procedure Date  . Appendectomy   . Tee without cardioversion 12/19/2011    Procedure: TRANSESOPHAGEAL ECHOCARDIOGRAM (TEE);  Surgeon: Pricilla Riffle, MD;  Location: [facility] ENDOSCOPY;  Service: Cardiovascular;  Laterality: N/A;    reports that he has never smoked. He does not have any smokeless tobacco history on file. He reports that he does not drink alcohol or use illicit drugs. family history includes Dementia in his brother and Parkinsonism in his brother. No Known Allergies Current Outpatient Prescriptions on File Prior to Visit  Medication Sig Dispense Refill  . clopidogrel (PLAVIX) 75 MG tablet Take 1 tablet (75 mg total) by mouth daily with breakfast.  30 tablet  3  . omeprazole (PRILOSEC) 20 MG capsule Take 20 mg by mouth daily as needed.        . simvastatin (ZOCOR) 20 MG tablet Take 1 tablet (20 mg total) by mouth daily at 6 PM.  30 tablet  2   Review of Systems Review of Systems  Constitutional: Negative for diaphoresis, activity change, appetite change and unexpected weight change.  HENT: Negative for hearing loss, ear pain, facial swelling, mouth sores and neck stiffness.   Eyes: Negative for pain, redness and visual disturbance.  Respiratory: Negative for shortness of breath and wheezing.   Cardiovascular: Negative for chest pain and palpitations.  Gastrointestinal: Negative for diarrhea, blood in stool, abdominal distention and rectal pain.  Genitourinary: Negative for hematuria, flank pain and decreased urine volume.  Musculoskeletal: Negative for myalgias and joint swelling.  Skin: Negative for color change and wound.  Neurological: Negative for syncope. Hematological: Negative  for adenopathy.  Psychiatric/Behavioral: Negative for hallucinations, self-injury, decreased concentration and agitation.      Objective:   Physical Exam BP 114/68  Pulse 65  Temp 97.9 F (36.6 C) (Oral)  Ht 6\' 1"  (1.854 m)  Wt 210 lb 2 oz (95.312 kg)  BMI 27.72 kg/m2  SpO2 98% Physical Exam  VS noted Constitutional: Pt is oriented to person, place, and time. Appears well-developed and well-nourished.  Head: Normocephalic and  atraumatic.  Right Ear: External ear normal.  Left Ear: External ear normal.  Nose: Nose normal.  Mouth/Throat: Oropharynx is clear and moist.  Eyes: Conjunctivae and EOM are normal. Pupils are equal, round, and reactive to light.  Neck: Normal range of motion. Neck supple. No JVD present. No tracheal deviation present.  Cardiovascular: Normal rate, regular rhythm, normal heart sounds and intact distal pulses.   Pulmonary/Chest: Effort normal and breath sounds normal.  Abdominal: Soft. Bowel sounds are normal. There is no tenderness.  Musculoskeletal: Normal range of motion. Exhibits no edema.  Lymphadenopathy:  Has no cervical adenopathy.  Neurological: Pt is alert and oriented to person, place, and time. Pt has normal reflexes. No cranial nerve deficit. Motor with 4+/5 LUE strength, trace LLE weakness  Skin: Skin is warm and dry. No rash noted.  Psychiatric:  Has  normal mood and affect. Behavior is normal.     Assessment & Plan:

## 2011-12-28 ENCOUNTER — Encounter: Payer: Self-pay | Admitting: Internal Medicine

## 2011-12-28 NOTE — Assessment & Plan Note (Signed)

## 2012-01-02 ENCOUNTER — Ambulatory Visit: Payer: Medicare Other | Attending: Neurology | Admitting: Physical Therapy

## 2012-01-02 DIAGNOSIS — I69998 Other sequelae following unspecified cerebrovascular disease: Secondary | ICD-10-CM | POA: Insufficient documentation

## 2012-01-02 DIAGNOSIS — R269 Unspecified abnormalities of gait and mobility: Secondary | ICD-10-CM | POA: Insufficient documentation

## 2012-01-02 DIAGNOSIS — IMO0001 Reserved for inherently not codable concepts without codable children: Secondary | ICD-10-CM | POA: Insufficient documentation

## 2012-01-04 ENCOUNTER — Inpatient Hospital Stay (HOSPITAL_COMMUNITY)
Admission: EM | Admit: 2012-01-04 | Discharge: 2012-01-06 | DRG: 066 | Disposition: A | Discharge status: Home / Self Care | Payer: Medicare Other | Attending: Internal Medicine | Admitting: Internal Medicine

## 2012-01-04 ENCOUNTER — Emergency Department (HOSPITAL_COMMUNITY): Payer: Medicare Other

## 2012-01-04 ENCOUNTER — Encounter (HOSPITAL_COMMUNITY): Payer: Self-pay | Admitting: Emergency Medicine

## 2012-01-04 DIAGNOSIS — Z0000 Encounter for general adult medical examination without abnormal findings: Secondary | ICD-10-CM

## 2012-01-04 DIAGNOSIS — I6350 Cerebral infarction due to unspecified occlusion or stenosis of unspecified cerebral artery: Secondary | ICD-10-CM

## 2012-01-04 DIAGNOSIS — D696 Thrombocytopenia, unspecified: Secondary | ICD-10-CM | POA: Diagnosis present

## 2012-01-04 DIAGNOSIS — F341 Dysthymic disorder: Secondary | ICD-10-CM | POA: Diagnosis present

## 2012-01-04 DIAGNOSIS — M1710 Unilateral primary osteoarthritis, unspecified knee: Secondary | ICD-10-CM

## 2012-01-04 DIAGNOSIS — I498 Other specified cardiac arrhythmias: Secondary | ICD-10-CM | POA: Diagnosis present

## 2012-01-04 DIAGNOSIS — K5730 Diverticulosis of large intestine without perforation or abscess without bleeding: Secondary | ICD-10-CM | POA: Diagnosis present

## 2012-01-04 DIAGNOSIS — IMO0002 Reserved for concepts with insufficient information to code with codable children: Secondary | ICD-10-CM

## 2012-01-04 DIAGNOSIS — M1712 Unilateral primary osteoarthritis, left knee: Secondary | ICD-10-CM

## 2012-01-04 DIAGNOSIS — I639 Cerebral infarction, unspecified: Secondary | ICD-10-CM

## 2012-01-04 DIAGNOSIS — I6340 Cerebral infarction due to embolism of unspecified cerebral artery: Principal | ICD-10-CM | POA: Diagnosis present

## 2012-01-04 DIAGNOSIS — I10 Essential (primary) hypertension: Secondary | ICD-10-CM | POA: Diagnosis present

## 2012-01-04 DIAGNOSIS — D72819 Decreased white blood cell count, unspecified: Secondary | ICD-10-CM | POA: Diagnosis present

## 2012-01-04 DIAGNOSIS — Z8673 Personal history of transient ischemic attack (TIA), and cerebral infarction without residual deficits: Secondary | ICD-10-CM | POA: Diagnosis present

## 2012-01-04 DIAGNOSIS — E785 Hyperlipidemia, unspecified: Secondary | ICD-10-CM | POA: Diagnosis present

## 2012-01-04 DIAGNOSIS — R5381 Other malaise: Secondary | ICD-10-CM | POA: Diagnosis present

## 2012-01-04 DIAGNOSIS — K219 Gastro-esophageal reflux disease without esophagitis: Secondary | ICD-10-CM

## 2012-01-04 LAB — CBC WITH DIFFERENTIAL/PLATELET
Basophils Absolute: 0 10*3/uL (ref 0.0–0.1)
Basophils Relative: 1 % (ref 0–1)
Eosinophils Absolute: 0 10*3/uL (ref 0.0–0.7)
Eosinophils Relative: 1 % (ref 0–5)
HCT: 43.7 % (ref 39.0–52.0)
Hemoglobin: 15.2 g/dL (ref 13.0–17.0)
Lymphocytes Relative: 18 % (ref 12–46)
Lymphs Abs: 0.7 10*3/uL (ref 0.7–4.0)
MCH: 33.2 pg (ref 26.0–34.0)
MCHC: 34.8 g/dL (ref 30.0–36.0)
MCV: 95.4 fL (ref 78.0–100.0)
Monocytes Absolute: 0.3 10*3/uL (ref 0.1–1.0)
Monocytes Relative: 8 % (ref 3–12)
Neutro Abs: 2.8 10*3/uL (ref 1.7–7.7)
Neutrophils Relative %: 73 % (ref 43–77)
Platelets: 130 10*3/uL — ABNORMAL LOW (ref 150–400)
RBC: 4.58 MIL/uL (ref 4.22–5.81)
RDW: 12.4 % (ref 11.5–15.5)
WBC: 3.8 10*3/uL — ABNORMAL LOW (ref 4.0–10.5)

## 2012-01-04 LAB — COMPREHENSIVE METABOLIC PANEL
ALT: 21 U/L (ref 0–53)
AST: 19 U/L (ref 0–37)
Albumin: 4.1 g/dL (ref 3.5–5.2)
Alkaline Phosphatase: 65 U/L (ref 39–117)
BUN: 20 mg/dL (ref 6–23)
CO2: 29 mEq/L (ref 19–32)
Calcium: 9.4 mg/dL (ref 8.4–10.5)
Chloride: 104 mEq/L (ref 96–112)
Creatinine, Ser: 1.28 mg/dL (ref 0.50–1.35)
GFR calc Af Amer: 62 mL/min — ABNORMAL LOW (ref >90)
GFR calc non Af Amer: 54 mL/min — ABNORMAL LOW (ref >90)
Glucose, Bld: 109 mg/dL — ABNORMAL HIGH (ref 70–99)
Potassium: 3.8 mEq/L (ref 3.5–5.1)
Sodium: 140 mEq/L (ref 135–145)
Total Bilirubin: 0.4 mg/dL (ref 0.3–1.2)
Total Protein: 6.5 g/dL (ref 6.0–8.3)

## 2012-01-04 LAB — APTT: aPTT: 32 seconds (ref 24–37)

## 2012-01-04 LAB — URINALYSIS, ROUTINE W REFLEX MICROSCOPIC
Bilirubin Urine: NEGATIVE
Glucose, UA: NEGATIVE mg/dL
Hgb urine dipstick: NEGATIVE
Ketones, ur: NEGATIVE mg/dL
Leukocytes, UA: NEGATIVE
Nitrite: NEGATIVE
Protein, ur: NEGATIVE mg/dL
Specific Gravity, Urine: 1.01 (ref 1.005–1.030)
Urobilinogen, UA: 0.2 mg/dL (ref 0.0–1.0)
pH: 7 (ref 5.0–8.0)

## 2012-01-04 LAB — POCT I-STAT TROPONIN I: Troponin i, poc: 0 ng/mL (ref 0.00–0.08)

## 2012-01-04 LAB — PROTIME-INR
INR: 1.02 (ref 0.00–1.49)
Prothrombin Time: 13.3 seconds (ref 11.6–15.2)

## 2012-01-04 LAB — TECHNOLOGIST SMEAR REVIEW

## 2012-01-04 MED ORDER — ACETAMINOPHEN 650 MG RE SUPP: 650.0000 mg | RECTAL | Status: DC | PRN

## 2012-01-04 MED ORDER — GADOBENATE DIMEGLUMINE 529 MG/ML IV SOLN: 17.0000 mL | Freq: Once | INTRAVENOUS | Status: DC | PRN

## 2012-01-04 MED ORDER — SIMVASTATIN 20 MG PO TABS
20.0000 mg | ORAL_TABLET | Freq: Every day | ORAL | Status: DC
  Administered 2012-01-04 – 2012-01-05 (×2): 20 mg via ORAL
  Filled 2012-01-04 (×3): qty 1

## 2012-01-04 MED ORDER — CLOPIDOGREL BISULFATE 75 MG PO TABS
75.0000 mg | ORAL_TABLET | Freq: Every day | ORAL | Status: DC
  Administered 2012-01-04 – 2012-01-06 (×3): 75 mg via ORAL
  Filled 2012-01-04 (×4): qty 1

## 2012-01-04 MED ORDER — ONDANSETRON HCL 4 MG/2ML IJ SOLN: 4.0000 mg | Freq: Four times a day (QID) | INTRAMUSCULAR | Status: DC | PRN

## 2012-01-04 MED ORDER — SODIUM CHLORIDE 0.9 % IV SOLN: INTRAVENOUS | Status: DC

## 2012-01-04 MED ORDER — SENNOSIDES-DOCUSATE SODIUM 8.6-50 MG PO TABS: 1.0000 | ORAL_TABLET | Freq: Every evening | ORAL | Status: DC | PRN

## 2012-01-04 MED ORDER — ONDANSETRON HCL 4 MG/2ML IJ SOLN: 4.0000 mg | Freq: Three times a day (TID) | INTRAMUSCULAR | Status: DC | PRN

## 2012-01-04 MED ORDER — ACETAMINOPHEN 325 MG PO TABS: 650.0000 mg | ORAL_TABLET | ORAL | Status: DC | PRN

## 2012-01-04 NOTE — Progress Notes (Signed)
   Comment:   76 year old male, with history of HTN, dyslipidemia, ED, anxiety/depression, GERD, diverticulosis, PUD, mitral regurgitation, hospitalized 12/16/11-12/19/11 for posterior right frontal lobe CVA and fully worked up at that time. Subsequently discharged on ASA/Plavix.  Patient has now presented with new right-sided weakness and brain MRI revealed new multifocal areas of acute infarction affect the left  Hemisphere. Patient will be admitted toe neurology/telemetry floor, for further management.   Acctive Problems: 1. Recurrent CVA. 2. HTN 3. Dyslipidemia.    C. Kerriann Kamphuis. MD, FACP.

## 2012-01-04 NOTE — ED Provider Notes (Signed)
History    75yM with R sided weakness. Noticed when woke up around 0500 today. Felt fine when went to bed. Could not move RLE at all and RUE was in position of flexion across his chest. Symptoms have improved since onset but still persistent. Not associated with any pain. Pt with recent hospitalization for LLE weakness. Pt had extensive w/u and MRI significant for acute small non hemorrhagic infarct posterior right frontal lobe involving the medial aspect of the right motor strip. Started on plavix. HAd been doing well since DC.   CSN: [id number]  Arrival date & time 01/04/12  2952   First MD Initiated Contact with Patient 01/04/12 0700      Chief Complaint  Patient presents with  . Altered Mental Status    (Consider location/radiation/quality/duration/timing/severity/associated sxs/prior treatment) HPI  Past Medical History  Diagnosis Date  . Hypertension   . Erectile dysfunction   . Hyperlipidemia   . Anxiety   . Depression   . GERD (gastroesophageal reflux disease)   . Diverticula, colon   . Syncope     History  . Mitral regurgitation   . Peptic ulcer disease     with Duodenal and Antral ulcers  . H. pylori infection   . HYPERLIPIDEMIA 05/12/2008  . ANXIETY 05/12/2008  . DEPRESSION 05/12/2008  . HYPERTENSION 05/12/2008  . GERD 05/12/2008  . PEPTIC ULCER DISEASE 05/12/2008  . DIVERTICULOSIS, COLON 05/12/2008  . OVERACTIVE BLADDER 05/12/2008    Past Surgical History  Procedure Date  . Appendectomy   . Tee without cardioversion 12/19/2011    Procedure: TRANSESOPHAGEAL ECHOCARDIOGRAM (TEE);  Surgeon: Pricilla Riffle, MD;  Location: [facility] ENDOSCOPY;  Service: Cardiovascular;  Laterality: N/A;    Family History  Problem Relation Age of Onset  . Parkinsonism Brother   . Dementia Brother     History  Substance Use Topics  . Smoking status: Never Smoker   . Smokeless tobacco: Not on file  . Alcohol Use: No      Review of Systems   Review of symptoms negative unless otherwise  noted in HPI.   Allergies  Review of patient's allergies indicates no known allergies.  Home Medications   Current Outpatient Rx  Name Route Sig Dispense Refill  . CLOPIDOGREL BISULFATE 75 MG PO TABS Oral Take 1 tablet (75 mg total) by mouth daily with breakfast. 30 tablet 3  . SIMVASTATIN 20 MG PO TABS Oral Take 1 tablet (20 mg total) by mouth daily at 6 PM. 30 tablet 2  . OMEPRAZOLE 20 MG PO CPDR Oral Take 20 mg by mouth daily as needed.        BP 167/82  Pulse 66  Temp 98 F (36.7 C) (Oral)  Resp 17  SpO2 99%  Physical Exam  Nursing note and vitals reviewed. Constitutional: He is oriented to person, place, and time. He appears well-developed and well-nourished. No distress.  HENT:  Head: Normocephalic and atraumatic.  Eyes: Conjunctivae normal are normal. Right eye exhibits no discharge. Left eye exhibits no discharge.  Neck: Neck supple.  Cardiovascular: Normal rate, regular rhythm and normal heart sounds.  Exam reveals no gallop and no friction rub.   No murmur heard. Pulmonary/Chest: Effort normal and breath sounds normal. No respiratory distress.  Abdominal: Soft. He exhibits no distension. There is no tenderness.  Musculoskeletal: He exhibits no edema and no tenderness.  Neurological: He is alert and oriented to person, place, and time. No cranial nerve deficit. Coordination normal.  Grip strength R slightly diminished as compared to L. Flexion at R elbow 4/5, extension 3/5. R hip flexion and extension 4/5. Plantar/dorsiflexion R sided 4/5. L side strength 5/5. Good finger to nose testing b/l. Speech clear and content appropriate. Able to identify objects around room correctly. Sensation intact to light touch.  Skin: Skin is warm and dry.  Psychiatric: He has a normal mood and affect. His behavior is normal. Thought content normal.    ED Course  Procedures (including critical care time)  Labs Reviewed  CBC WITH DIFFERENTIAL - Abnormal; Notable for the  following:    WBC 3.8 (*)     Platelets 130 (*)     All other components within normal limits  COMPREHENSIVE METABOLIC PANEL - Abnormal; Notable for the following:    Glucose, Bld 109 (*)     GFR calc non Af Amer 54 (*)     GFR calc Af Amer 62 (*)     All other components within normal limits  URINALYSIS, ROUTINE W REFLEX MICROSCOPIC  PROTIME-INR  APTT  POCT I-STAT TROPONIN I   Ct Head Wo Contrast  01/04/2012  *RADIOLOGY REPORT*  Clinical Data: Altered mental status.  Difficulty with speech. Paralysis of right arm.  CT HEAD WITHOUT CONTRAST  Technique:  Contiguous axial images were obtained from the base of the skull through the vertex without contrast.  Comparison: Brain MRI 12/17/2011.  Head CT 12/16/2011.  Findings: Mild cerebral and cerebellar atrophy.  A well-defined focus of low attenuation in the left cerebellar hemispheres compatible with old remote prior infarction.  Extensive patchy and confluent areas of decreased attenuation throughout the deep and periventricular white matter of the cerebral hemispheres bilaterally is compatible with severe chronic microvascular ischemic changes, similar to prior examinations.  No definite acute intracranial abnormality.  Specifically, no definite evidence of large vascular territory acute/subacute cerebral ischemia, no acute intracerebral hemorrhage, no focal mass, mass effect, definite hydrocephalus or abnormal intra or extra-axial fluid collections. No acute displaced skull fractures are noted.  Visualized paranasal sinuses and mastoids are well pneumatized.  IMPRESSION: 1.  No acute intracranial abnormalities. 2.  The appearance of brain is similar to prior examinations, as detailed above.   Original Report Authenticated By: Florencia Reasons, M.D.    Mr Brain Wo Contrast  01/04/2012  *RADIOLOGY REPORT*  Clinical Data: Right-sided weakness.  History of hypertension.  MRI HEAD WITHOUT CONTRAST  Technique:  Multiplanar, multiecho pulse sequences of  the brain and surrounding structures were obtained according to standard protocol without intravenous contrast.  Comparison: CT head performed earlier in the day.  MRI brain 12/17/2011.  Findings: New multifocal areas of acute infarction affect the left hemisphere involving the frontal and posterior frontal cortex. The largest area of involvement is in the parasagittal left posterior frontal cortex near the motor strip. There is no visible acute hemorrhage.  Previously described right posterior frontal cortex infarct is resolving.  New punctate infarcts affect the left parietal and left occipital cortex. Given the widespread distribution, embolic strokes should be considered.  There is no mass lesion, hydrocephalus, or extra-axial fluid. Major intracranial vascular structures are patent.  There is advanced atrophy with chronic microvascular ischemic change.  Widespread lacunes are noted.  Chronic brainstem and cerebellar infarcts. Pituitary and cerebellar tonsils unremarkable. Negative calvarium.  No acute sinus or mastoid disease.  IMPRESSION: New multifocal areas of acute infarction affect the left hemisphere, most notably in a parasagittal location near the motor strip. Considering the different vascular territories  involved by the previous recent infarct in the right hemisphere, and today's acute infarcts on the left, a cardiac source should be considered.  Resolving right posterior frontal infarct.  Atrophy with extensive chronic microvascular ischemic change and multiple lacunes.   Original Report Authenticated By: Elsie Stain, M.D.     EKG:  Rhythm:normal sinus Vent. rate 63 BPM PR interval 156 ms QRS duration 98 ms QT/QTc 424/434 ms Intervals: normal Axis: normal ST segments: normal   1. CVA (cerebral infarction)       MDM  75yM with R sided weakness. Not TPA candidate given unclear onset of symptoms. Symptoms improving since first noticed. Stroke eval. Will discuss with neurology.    9:55 AM Pt with recent stroke on ASA and now on Plavix. Diagnostic testing during recent hsopitalization including carotid doppler and TEE fairly unremarkable. Discussed with neurology. Recommending admit but not acute interventions at this time. Will discuss with hospitalist.        Raeford Razor, MD 01/04/12 [phone]

## 2012-01-04 NOTE — H&P (Addendum)
[facility] History and Physical  Little Winton Mars WUJ:[id number] DOB: [birth year] DOA: 01/04/2012  Referring physician:    PCP: Oliver Barre, MD   Chief Complaint:  Weakness of right arm and leg  HPI:  The patient is a 76 yo male with history of HTN, HLD, and recent embolic CVA two weeks ago who presents with right arm and leg weakness.  The patient was last at his baseline level of health until just before 12/19/11 when he developed left lower extremity heaviness and numbness with inability to move the leg.  He was admitted to [facility] and found to have an acute small infarct in the posterior right frontal lobe involving the medial aspect of the right motor strip.  He also had prominent small vessel disease and remove cerebellar infarcts bilaterally.  MRA revealed intracranial atherosclerosis.  Carotid duplex showed no significant extracranial carotid artery stenosis with patent vertebral arteries.  TEE and 2D TTE showed preserved EF with grade 1 diastolic dysfunction, AV sclerosis, and no PFO or atrial communication.  Patient was discharged on statin and plavix (had previously been on ASA) and was placed on ambulatory event monitor.  He felt near his baseline at the time of discharge to home.  The patient states he felt fine when he went to bed and he got up in the middle of the night to use the bathroom, however, around 5:15 AM, he woke up and realized he could not move his left leg.  Shortly afterwards, his right arm became weak.  He voided due to full bladder and inability to move to bathroom, but denies shaking or jerking or LOC or confusion. Denies facial droop (wife concurs) or confusion or slurred speech.  He had no numbness.  Transported via EMS to [facility] where he underwent repeat CT and MRI brain which showed new multifocal areas of acute infarction affecting the left hemisphere, including near the motor strip, suggestive of a cardiac source.     Review of Systems:    Denies fevers, chills, weight loss, night sweats, cold symptoms, cough, chest pain, palpitations, shortness of breath, nausea, vomiting, diarrhea, constipation, abdominal pain, dysuria, urinary frequency, blood in stools, lymphadenopathy, abnormal bruising or bleeding.  Denies anxiety or depression.  Neurologic symptoms as above.    Past Medical History  Diagnosis Date  . Hypertension   . Erectile dysfunction   . Hyperlipidemia   . Anxiety   . Depression   . GERD (gastroesophageal reflux disease)   . Diverticula, colon   . Syncope     History  . Mitral regurgitation   . Peptic ulcer disease     with Duodenal and Antral ulcers  . H. pylori infection   . HYPERLIPIDEMIA 05/12/2008  . ANXIETY 05/12/2008  . DEPRESSION 05/12/2008  . HYPERTENSION 05/12/2008  . GERD 05/12/2008  . PEPTIC ULCER DISEASE 05/12/2008  . DIVERTICULOSIS, COLON 05/12/2008  . OVERACTIVE BLADDER 05/12/2008  . Fracture of knee region 1974    with residual weakness   Past Surgical History  Procedure Date  . Appendectomy   . Tee without cardioversion 12/19/2011    Procedure: TRANSESOPHAGEAL ECHOCARDIOGRAM (TEE);  Surgeon: Pricilla Riffle, MD;  Location: [facility] ENDOSCOPY;  Service: Cardiovascular;  Laterality: N/A;   Social History:  reports that he has never smoked. He does not have any smokeless tobacco history on file. He reports that he does not drink alcohol or use illicit drugs. Lives at home with wife.  Previously ambulates without cane or walker.  Completes ADLs without assistance.    No Known Allergies  Family History  Problem Relation Age of Onset  . Parkinsonism Brother   . Dementia Brother   Reviewed.  Prior to Admission medications   Medication Sig Start Date End Date Taking? Authorizing Provider  clopidogrel (PLAVIX) 75 MG tablet Take 1 tablet (75 mg total) by mouth daily with breakfast. 12/18/11 12/17/12 Yes Osvaldo Shipper, MD  simvastatin (ZOCOR) 20 MG tablet Take 1 tablet (20 mg total) by mouth daily at 6 PM.  12/18/11 12/17/12 Yes Osvaldo Shipper, MD  omeprazole (PRILOSEC) 20 MG capsule Take 20 mg by mouth daily as needed.      Historical Provider, MD   Physical Exam: Filed Vitals:   01/04/12 1230 01/04/12 1245 01/04/12 1300 01/04/12 1332  BP: 151/84 125/105 128/71 160/85  Pulse: 60 73 66 65  Temp:    97.7 F (36.5 C)  TempSrc:      Resp: 11 19 17 20   SpO2: 99% 99% 100% 100%     General:  Caucasian male, no acute distress  Eyes: PERRL, anicteric, noninjected, EOMI, no nystagmus  ENT: NCAT, nares and OP nonerythematous, no exudate, MMM  Neck: supple, no JVP  Lymph:  No cervical or supraclavicular LAD  Cardiovascular: RRR, no murmurs, rubs, or gallops  Respiratory: CTAB  Abdomen: NABS, soft, nondistended, nontender, no organomegaly  Skin: no rash, no ecchymoses  Musculoskeletal: Normal tone and bulk  Psychiatric: A&Ox4, speech is at baseline per wife  Neurologic: II-XII grossly intact except acuity and visual fields not tested.  Strength 5-/5 right upper and lower extremity and 5/5 left upper and lower extremity.  Slow rapid finger tap on the right with some mild dysmetria with finger to nose on right.  Left normal.  Patient had difficulty sitting up.  Gait deferred.    Labs on Admission:  Basic Metabolic Panel:  Lab 01/04/12 6213  NA 140  K 3.8  CL 104  CO2 29  GLUCOSE 109*  BUN 20  CREATININE 1.28  CALCIUM 9.4  MG --  PHOS --   Liver Function Tests:  Lab 01/04/12 0652  AST 19  ALT 21  ALKPHOS 65  BILITOT 0.4  PROT 6.5  ALBUMIN 4.1   No results found for this basename: LIPASE:5,AMYLASE:5 in the last 168 hours No results found for this basename: AMMONIA:5 in the last 168 hours CBC:  Lab 01/04/12 0652  WBC 3.8*  NEUTROABS 2.8  HGB 15.2  HCT 43.7  MCV 95.4  PLT 130*   Cardiac Enzymes: No results found for this basename: CKTOTAL:5,CKMB:5,CKMBINDEX:5,TROPONINI:5 in the last 168 hours  BNP (last 3 results) No results found for this basename: PROBNP:3  in the last 8760 hours CBG: No results found for this basename: GLUCAP:5 in the last 168 hours  Radiological Exams on Admission: Ct Head Wo Contrast  01/04/2012  *RADIOLOGY REPORT*  Clinical Data: Altered mental status.  Difficulty with speech. Paralysis of right arm.  CT HEAD WITHOUT CONTRAST  Technique:  Contiguous axial images were obtained from the base of the skull through the vertex without contrast.  Comparison: Brain MRI 12/17/2011.  Head CT 12/16/2011.  Findings: Mild cerebral and cerebellar atrophy.  A well-defined focus of low attenuation in the left cerebellar hemispheres compatible with old remote prior infarction.  Extensive patchy and confluent areas of decreased attenuation throughout the deep and periventricular white matter of the cerebral hemispheres bilaterally is compatible with severe chronic microvascular ischemic changes, similar to prior examinations.  No definite  acute intracranial abnormality.  Specifically, no definite evidence of large vascular territory acute/subacute cerebral ischemia, no acute intracerebral hemorrhage, no focal mass, mass effect, definite hydrocephalus or abnormal intra or extra-axial fluid collections. No acute displaced skull fractures are noted.  Visualized paranasal sinuses and mastoids are well pneumatized.  IMPRESSION: 1.  No acute intracranial abnormalities. 2.  The appearance of brain is similar to prior examinations, as detailed above.   Original Report Authenticated By: Florencia Reasons, M.D.    Mr Brain Wo Contrast  01/04/2012  *RADIOLOGY REPORT*  Clinical Data: Right-sided weakness.  History of hypertension.  MRI HEAD WITHOUT CONTRAST  Technique:  Multiplanar, multiecho pulse sequences of the brain and surrounding structures were obtained according to standard protocol without intravenous contrast.  Comparison: CT head performed earlier in the day.  MRI brain 12/17/2011.  Findings: New multifocal areas of acute infarction affect the left  hemisphere involving the frontal and posterior frontal cortex. The largest area of involvement is in the parasagittal left posterior frontal cortex near the motor strip. There is no visible acute hemorrhage.  Previously described right posterior frontal cortex infarct is resolving.  New punctate infarcts affect the left parietal and left occipital cortex. Given the widespread distribution, embolic strokes should be considered.  There is no mass lesion, hydrocephalus, or extra-axial fluid. Major intracranial vascular structures are patent.  There is advanced atrophy with chronic microvascular ischemic change.  Widespread lacunes are noted.  Chronic brainstem and cerebellar infarcts. Pituitary and cerebellar tonsils unremarkable. Negative calvarium.  No acute sinus or mastoid disease.  IMPRESSION: New multifocal areas of acute infarction affect the left hemisphere, most notably in a parasagittal location near the motor strip. Considering the different vascular territories involved by the previous recent infarct in the right hemisphere, and today's acute infarcts on the left, a cardiac source should be considered.  Resolving right posterior frontal infarct.  Atrophy with extensive chronic microvascular ischemic change and multiple lacunes.   Original Report Authenticated By: Elsie Stain, M.D.     EKG:  NSR  Assessment/Plan Active Problems:  HYPERLIPIDEMIA  HYPERTENSION  DIVERTICULOSIS, COLON  Acute ischemic stroke   Acute ischemic stroke with residual right sided weakness and dysmetria and difficulty sitting up.  Patient recently underwent extensive work up for source of CVA.  Called [facility] Toll free: [phone] for ACT monitor.  Device last uploaded yesterday but since starting, there had been no arrhythmias. -  Asked wife to take monitor home and place it next to the phone so it can upload, which she did, but the monitor either did not record overnight or it had a problem  uploading. Gave wife two numbers: Troubleshoot:  [phone], Re-enrollment:  [phone]. -  Appreciate neurology assistance -  Continue plavix -  Neuro checks -  PT/OT -  Passed bedside swallow -  Risk factors were addressed less than two weeks ago.  Repeat A1c three months from current.  Need to wait several more weeks before rechecking lipid panel after recently starting statin.  Hypertension, diet controlled.  Patient's blood pressures are mildly elevated in setting of acute CVA.   - Permissive hypertension x 24 hours  HLD, LDL 93 on 9/10 (at goal) -  Continue statin  Mild leukopenia and thrombocytopenia, unclear etiology -  Trend -  Send smear -  Consider hematology consultation   DIET:  Healthy heart ACCESS:  PIV IVF:  none PROPH:  lovenox  Code Status: Full code Family Communication: Spoke with patient and  patient's wife regarding plan of care Disposition Plan: Pending PT and OT recommendations  Time spent:  60 minutes  Rj Pedrosa [facility] Pager [phone]  If 7PM-7AM, please contact night-coverage [email or web address] Password [email or web address] 01/04/2012, 3:10 PM     &

## 2012-01-04 NOTE — Consult Note (Signed)
Referring Physician:  Dr.  Thea Silversmith   Chief Complaint:  Ischemic stroke HPI: This is a 76 y/o RHWM who was recently discharged from the hospital for ischemic stroke and  Had the stroke work up done extensively and MRI indicated stroke in the left  .  He returns now with  Right sided weakness while he went to use the rest room this morning around 5 AM. He ignored it for a while. His initial complaint was right UE and then followed by Right LE. MRI of the brain was done in the ED which indicated new left hemispheric stroke in the left motor strip.  He denies chest pain, no nausea, no vomiting, no sob, no abdominal pain, no confusion, no disorientation, no No fall, no trauma, no palpitations, no dizziness.       Past Medical History  Diagnosis Date  . Hypertension   . Erectile dysfunction   . Hyperlipidemia   . Anxiety   . Depression   . GERD (gastroesophageal reflux disease)   . Diverticula, colon   . Syncope     History  . Mitral regurgitation   . Peptic ulcer disease     with Duodenal and Antral ulcers  . H. pylori infection   . HYPERLIPIDEMIA 05/12/2008  . ANXIETY 05/12/2008  . DEPRESSION 05/12/2008  . HYPERTENSION 05/12/2008  . GERD 05/12/2008  . PEPTIC ULCER DISEASE 05/12/2008  . DIVERTICULOSIS, COLON 05/12/2008  . OVERACTIVE BLADDER 05/12/2008  . Fracture of knee region 1974    with residual weakness    Past Surgical History  Procedure Date  . Appendectomy   . Tee without cardioversion 12/19/2011    Procedure: TRANSESOPHAGEAL ECHOCARDIOGRAM (TEE);  Surgeon: Pricilla Riffle, MD;  Location: [facility];  Service: Cardiovascular;  Laterality: N/A;    Family History  Problem Relation Age of Onset  . Parkinsonism Brother   . Dementia Brother    Social History:  reports that he has never smoked. He does not have any smokeless tobacco history on file. He reports that he does not drink alcohol or use illicit drugs.  Allergies: No Known Allergies  Medications:  Scheduled:   .  clopidogrel  75 mg Oral Q breakfast  . simvastatin  20 mg Oral q1800    ROS: All 12 point review of systems were reviewed and pertinent are mentioned in the HPI  Physical Examination: Blood pressure 160/85, pulse 65, temperature 97.7 F (36.5 C), temperature source Oral, resp. rate 20, SpO2 100.00%. Physical Exam General: AAO, following commands, watching tv HEENT: EOMI, PEERLA, no ptosis, no neglect, no field cut Lungs: CTA Heart: RRR Abdomen: Bowel sounds present Skin: No rash Extremity: no edema  Neurologic Examination: Mental Status: Alert, oriented, thought content appropriate.  Speech fluent without evidence of aphasia.  Able to follow 3 step commands without difficulty. Cranial Nerves: II: visual fields grossly normal, pupils equal, round, reactive to light and accommodation III,IV, VI: ptosis not present, extra-ocular motions intact bilaterally V,VII: smile symmetric, facial light touch sensation normal bilaterally VIII: hearing normal bilaterally IX,X: gag reflex present XI: trapezius strength/neck flexion strength normal bilaterally XII: tongue strength normal  Motor: Right : Upper extremity   4/5    Left:     Upper extremity   5-/5  Lower extremity   4/5     Lower extremity   4/5 Tone and bulk:normal tone throughout; no atrophy noted Sensory: Pinprick and light touch intact throughout, bilaterally Deep Tendon Reflexes: 2+ and symmetric throughout Plantars: Right: downgoing  Left: downgoing Cerebellar: normal finger-to-nose, normal rapid alternating movements and normal heel-to-shin test Negative Rhomberg    Results for orders placed during the hospital encounter of 01/04/12 (from the past 48 hour(s))  CBC WITH DIFFERENTIAL     Status: Abnormal   Collection Time   01/04/12  6:52 AM      Component Value Range Comment   WBC 3.8 (*) 4.0 - 10.5 K/uL    RBC 4.58  4.22 - 5.81 MIL/uL    Hemoglobin 15.2  13.0 - 17.0 g/dL    HCT 16.1  09.6 - 04.5 %    MCV 95.4   78.0 - 100.0 fL    MCH 33.2  26.0 - 34.0 pg    MCHC 34.8  30.0 - 36.0 g/dL    RDW 40.9  81.1 - 91.4 %    Platelets 130 (*) 150 - 400 K/uL    Neutrophils Relative 73  43 - 77 %    Neutro Abs 2.8  1.7 - 7.7 K/uL    Lymphocytes Relative 18  12 - 46 %    Lymphs Abs 0.7  0.7 - 4.0 K/uL    Monocytes Relative 8  3 - 12 %    Monocytes Absolute 0.3  0.1 - 1.0 K/uL    Eosinophils Relative 1  0 - 5 %    Eosinophils Absolute 0.0  0.0 - 0.7 K/uL    Basophils Relative 1  0 - 1 %    Basophils Absolute 0.0  0.0 - 0.1 K/uL   COMPREHENSIVE METABOLIC PANEL     Status: Abnormal   Collection Time   01/04/12  6:52 AM      Component Value Range Comment   Sodium 140  135 - 145 mEq/L    Potassium 3.8  3.5 - 5.1 mEq/L    Chloride 104  96 - 112 mEq/L    CO2 29  19 - 32 mEq/L    Glucose, Bld 109 (*) 70 - 99 mg/dL    BUN 20  6 - 23 mg/dL    Creatinine, Ser 7.82  0.50 - 1.35 mg/dL    Calcium 9.4  8.4 - 95.6 mg/dL    Total Protein 6.5  6.0 - 8.3 g/dL    Albumin 4.1  3.5 - 5.2 g/dL    AST 19  0 - 37 U/L    ALT 21  0 - 53 U/L    Alkaline Phosphatase 65  39 - 117 U/L    Total Bilirubin 0.4  0.3 - 1.2 mg/dL    GFR calc non Af Amer 54 (*) >90 mL/min    GFR calc Af Amer 62 (*) >90 mL/min   PROTIME-INR     Status: Normal   Collection Time   01/04/12  6:52 AM      Component Value Range Comment   Prothrombin Time 13.3  11.6 - 15.2 seconds    INR 1.02  0.00 - 1.49   APTT     Status: Normal   Collection Time   01/04/12  6:52 AM      Component Value Range Comment   aPTT 32  24 - 37 seconds   POCT I-STAT TROPONIN I     Status: Normal   Collection Time   01/04/12  7:19 AM      Component Value Range Comment   Troponin i, poc 0.00  0.00 - 0.08 ng/mL    Comment 3  URINALYSIS, ROUTINE W REFLEX MICROSCOPIC     Status: Normal   Collection Time   01/04/12  8:51 AM      Component Value Range Comment   Color, Urine YELLOW  YELLOW    APPearance CLEAR  CLEAR    Specific Gravity, Urine 1.010  1.005 - 1.030     pH 7.0  5.0 - 8.0    Glucose, UA NEGATIVE  NEGATIVE mg/dL    Hgb urine dipstick NEGATIVE  NEGATIVE    Bilirubin Urine NEGATIVE  NEGATIVE    Ketones, ur NEGATIVE  NEGATIVE mg/dL    Protein, ur NEGATIVE  NEGATIVE mg/dL    Urobilinogen, UA 0.2  0.0 - 1.0 mg/dL    Nitrite NEGATIVE  NEGATIVE    Leukocytes, UA NEGATIVE  NEGATIVE MICROSCOPIC NOT DONE ON URINES WITH NEGATIVE PROTEIN, BLOOD, LEUKOCYTES, NITRITE, OR GLUCOSE <1000 mg/dL.  TECHNOLOGIST SMEAR REVIEW     Status: Normal   Collection Time   01/04/12  3:19 PM      Component Value Range Comment   Tech Review MORPHOLOGY UNREMARKABLE      Ct Head Wo Contrast  01/04/2012  *RADIOLOGY REPORT*  Clinical Data: Altered mental status.  Difficulty with speech. Paralysis of right arm.  CT HEAD WITHOUT CONTRAST  Technique:  Contiguous axial images were obtained from the base of the skull through the vertex without contrast.  Comparison: Brain MRI 12/17/2011.  Head CT 12/16/2011.  Findings: Mild cerebral and cerebellar atrophy.  A well-defined focus of low attenuation in the left cerebellar hemispheres compatible with old remote prior infarction.  Extensive patchy and confluent areas of decreased attenuation throughout the deep and periventricular white matter of the cerebral hemispheres bilaterally is compatible with severe chronic microvascular ischemic changes, similar to prior examinations.  No definite acute intracranial abnormality.  Specifically, no definite evidence of large vascular territory acute/subacute cerebral ischemia, no acute intracerebral hemorrhage, no focal mass, mass effect, definite hydrocephalus or abnormal intra or extra-axial fluid collections. No acute displaced skull fractures are noted.  Visualized paranasal sinuses and mastoids are well pneumatized.  IMPRESSION: 1.  No acute intracranial abnormalities. 2.  The appearance of brain is similar to prior examinations, as detailed above.   Original Report Authenticated By: Florencia Reasons, M.D.    Mr Brain Wo Contrast  01/04/2012  *RADIOLOGY REPORT*  Clinical Data: Right-sided weakness.  History of hypertension.  MRI HEAD WITHOUT CONTRAST  Technique:  Multiplanar, multiecho pulse sequences of the brain and surrounding structures were obtained according to standard protocol without intravenous contrast.  Comparison: CT head performed earlier in the day.  MRI brain 12/17/2011.  Findings: New multifocal areas of acute infarction affect the left hemisphere involving the frontal and posterior frontal cortex. The largest area of involvement is in the parasagittal left posterior frontal cortex near the motor strip. There is no visible acute hemorrhage.  Previously described right posterior frontal cortex infarct is resolving.  New punctate infarcts affect the left parietal and left occipital cortex. Given the widespread distribution, embolic strokes should be considered.  There is no mass lesion, hydrocephalus, or extra-axial fluid. Major intracranial vascular structures are patent.  There is advanced atrophy with chronic microvascular ischemic change.  Widespread lacunes are noted.  Chronic brainstem and cerebellar infarcts. Pituitary and cerebellar tonsils unremarkable. Negative calvarium.  No acute sinus or mastoid disease.  IMPRESSION: New multifocal areas of acute infarction affect the left hemisphere, most notably in a parasagittal location near the motor strip. Considering the different vascular  territories involved by the previous recent infarct in the right hemisphere, and today's acute infarcts on the left, a cardiac source should be considered.  Resolving right posterior frontal infarct.  Atrophy with extensive chronic microvascular ischemic change and multiple lacunes.   Original Report Authenticated By: Elsie Stain, M.D.     Assessment: 76 y/o male with cardiac disease returns with cardio embolic ischemic stroke. Patient has had major work up done including MRI, MRA, Echo,  carotid dopplers  Plan: MRI is done. Patient may have paroxysmal afib.   At present continue with Plavix and Statin Will need cardiac work up prior to starting Coumadin.  Patient has no significant deficits   Fall risk  .Approximately  60 minutes spent with the patient, obtaining history, reviewing labs, images, examination and generating a plan  Shaquel Chavous V-P Eilleen Kempf., MD., Ph.D.,MS 01/04/2012 5:28 PM

## 2012-01-04 NOTE — ED Notes (Addendum)
Pt LSN 0230 pt had paralysis of right arm (posturing like behavior) and foot was flaccid; difficulty with speech but no facial droop ; hx TIA's. Urinary incontinence prior to EMS arrival.

## 2012-01-05 ENCOUNTER — Telehealth: Payer: Self-pay | Admitting: Physician Assistant

## 2012-01-05 LAB — BASIC METABOLIC PANEL
BUN: 17 mg/dL (ref 6–23)
CO2: 29 mEq/L (ref 19–32)
Calcium: 9.5 mg/dL (ref 8.4–10.5)
Chloride: 104 mEq/L (ref 96–112)
Creatinine, Ser: 1.2 mg/dL (ref 0.50–1.35)
GFR calc Af Amer: 67 mL/min — ABNORMAL LOW (ref >90)
GFR calc non Af Amer: 58 mL/min — ABNORMAL LOW (ref >90)
Glucose, Bld: 105 mg/dL — ABNORMAL HIGH (ref 70–99)
Potassium: 4.2 mEq/L (ref 3.5–5.1)
Sodium: 140 mEq/L (ref 135–145)

## 2012-01-05 LAB — CBC
HCT: 43.9 % (ref 39.0–52.0)
Hemoglobin: 14.9 g/dL (ref 13.0–17.0)
MCH: 32.4 pg (ref 26.0–34.0)
MCHC: 33.9 g/dL (ref 30.0–36.0)
MCV: 95.4 fL (ref 78.0–100.0)
Platelets: 129 10*3/uL — ABNORMAL LOW (ref 150–400)
RBC: 4.6 MIL/uL (ref 4.22–5.81)
RDW: 12.4 % (ref 11.5–15.5)
WBC: 5.8 10*3/uL (ref 4.0–10.5)

## 2012-01-05 NOTE — Telephone Encounter (Signed)
Patient wearing an event monitor for hx of stroke. Readmitted 9/28 for recurrent stroke. Monitor is not working.  Suspect battery is dead. Patient needs battery/monitor replaced as soon as it can be done. Also, patient needs follow up with Dr. Dietrich Pates in the next several weeks (after monitor done).  She did his TEE but no formal consult or office visit.  So, he is to be scheduled as a new patient with her (ie cannot go on PA/NP schedule). I have left message on after hours machine. Tereso Newcomer, PA-C  1:15 PM 01/05/2012

## 2012-01-05 NOTE — Evaluation (Signed)
Physical Therapy Evaluation Patient Details Name: Jeffrey Hardy MRN: [id number] DOB: Aug 15, 1935 Today's Date: 01/05/2012 Time: 1112-1205 PT Time Calculation (min): 53 min  PT Assessment / Plan / Recommendation Clinical Impression  Pt admitted with new onset of right sided weakness, found to have left posterior frontal CVA in the area near the motor strip. Pt currently experiencing loss of balance, and decreased safety awareness. Pt will benefit from skilled PT in the acute care setting in order to maximize functional mobility, and safety for a safe d/c home.     PT Assessment  Patient needs continued PT services    Follow Up Recommendations  Home health PT;Supervision for mobility/OOB    Barriers to Discharge        Equipment Recommendations  Rolling walker with 5" wheels    Recommendations for Other Services     Frequency Min 4X/week    Precautions / Restrictions Precautions Precautions: Fall Restrictions Weight Bearing Restrictions: No   Pertinent Vitals/Pain Pt with no pain complaints.      Mobility  Transfers Transfers: Sit to Stand;Stand to Sit Sit to Stand: 4: Min guard;With upper extremity assist;From chair/3-in-1 Stand to Sit: 4: Min guard;With upper extremity assist;To chair/3-in-1 Details for Transfer Assistance: cues for safety. Loss of balance forward and pt making excuses for balance. Ambulation/Gait Ambulation/Gait Assistance: 4: Min guard;4: Min Environmental consultant (Feet): 100 Feet Assistive device: Rolling walker;None Ambulation/Gait Assistance Details: Attempted ambulation with RW, minguard only. Pt with decreased control of RLE although no loss of balance. Without RW, pt still minugard requiring min assist 2-3 times secondary to LOB, decreased stability with turns.  Gait Pattern: Step-through pattern;Antalgic General Gait Details: Included high level cogntive taks to ambulation since as looking left/right, finding room numbers as well as  increased distractions by multiple persons. No change in balance loss Modified Rankin (Stroke Patients Only) Pre-Morbid Rankin Score: Slight disability Modified Rankin: Moderately severe disability           PT Diagnosis: Difficulty walking;Abnormality of gait  PT Problem List: Decreased activity tolerance;Decreased balance;Decreased mobility;Decreased knowledge of use of DME;Decreased safety awareness;Decreased knowledge of precautions PT Treatment Interventions: DME instruction;Gait training;Stair training;Functional mobility training;Therapeutic activities;Therapeutic exercise;Balance training;Neuromuscular re-education;Patient/family education   PT Goals Acute Rehab PT Goals PT Goal Formulation: With patient Time For Goal Achievement: 01/12/12 Potential to Achieve Goals: Good Pt will go Sit to Stand: with modified independence PT Goal: Sit to Stand - Progress: Goal set today Pt will go Stand to Sit: with modified independence PT Goal: Stand to Sit - Progress: Goal set today Pt will Ambulate: >150 feet;with modified independence;with least restrictive assistive device PT Goal: Ambulate - Progress: Goal set today Pt will Go Up / Down Stairs: 1-2 stairs;with rail(s);with modified independence PT Goal: Up/Down Stairs - Progress: Goal set today  Visit Information  Last PT Received On: 01/05/12 Assistance Needed: +1 PT/OT Co-Evaluation/Treatment: Yes    Subjective Data  Patient Stated Goal: home   Prior Functioning  Home Living Lives With: Spouse Available Help at Discharge: Family;Available 24 hours/day Type of Home: House Home Access: Stairs to enter [employer] of Steps: 2 Entrance Stairs-Rails: None Home Layout: One level;Laundry or work area in [employer] Shower/Tub: Tub/shower unit;Walk-in shower Bathroom Toilet: Standard Bathroom Accessibility: Yes How Accessible: Accessible via walker Home Adaptive Equipment: Built-in shower seat  (crutches) Prior Function Level of Independence: Independent Able to Take Stairs?: Yes Driving: Yes 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 for tasks performed Current Attention Level: Selective Attention - Other Comments: increased difficualty with distraction Awareness of Errors: Assistance required to identify errors made;Assistance required to correct errors made Awareness of Errors - Other Comments: decreased anticipatory awareness Executive Functioning: apparent deficits with higher level cognitive skills. will further assess Cognition - Other Comments: Pt. able to alternate attention between reading room numbers, talking with therapist, and talking with visitor    Extremity/Trunk Assessment Right Upper Extremity Assessment RUE ROM/Strength/Tone: [facility] for tasks assessed RUE Sensation: WFL - Light Touch;WFL - Proprioception RUE Coordination: Deficits RUE Coordination Deficits: decreased gross and fine motor Left Upper Extremity Assessment LUE ROM/Strength/Tone: WFL for tasks assessed LUE Sensation: WFL - Light Touch;WFL - Proprioception LUE Coordination: WFL - gross motor;WFL - fine motor Right Lower Extremity Assessment RLE ROM/Strength/Tone: Within functional levels RLE Sensation: WFL - Light Touch Left Lower Extremity Assessment LLE ROM/Strength/Tone: WFL for tasks assessed LLE Sensation: WFL - Light Touch Trunk Assessment Trunk Assessment: Other exceptions Trunk Exceptions: r lateral lean. Able to come to midline, but unaware of R preference. Pt reports hx of back problems, howeer, feel this is neurological   Balance Balance Balance Assessed: Yes Static  Standing Balance Single Leg Stance - Right Leg:  (unable to maintain 1 second) Single Leg Stance - Left Leg: 10  (seconds)  End of Session PT - End of Session Equipment Utilized During Treatment: Gait belt Activity Tolerance: Patient tolerated treatment well Patient left: in chair;with call bell/phone within reach Nurse Communication: Mobility status    Milana Kidney 01/05/2012, 2:32 PM  01/05/2012 Milana Kidney DPT PAGER: [phone] OFFICE: [phone]

## 2012-01-05 NOTE — Progress Notes (Signed)
Occupational Therapy Evaluation Patient Details Name: Jeffrey Hardy MRN: [id number] DOB: 1936/02/09 Today's Date: 01/05/2012 Time: 1110-1205 OT Time Calculation (min): 55 min  OT Assessment / Plan / Recommendation Clinical Impression  76 yo s/p L fronto parietal infarct with resulting deficits with coordination, high level balance and apparent visual and executive level cognitive deficits. Pt will benefit from skilled OT acute services. Pt will need to f/u with the neuro outpt center. Rec 24/7 S after D/C. Discussed importance of NO DRIVING until pt returns to [facility] and sees his eye doctor. Pt/wife verbally agree.     OT Assessment  Patient needs continued OT Services    Follow Up Recommendations  Outpatient OT;Other (comment) (neuro outpt)    Barriers to Discharge None    Equipment Recommendations  Rolling walker with 5" wheels    Recommendations for Other Services    Frequency  Min 2X/week    Precautions / Restrictions Precautions Precautions: Fall   Pertinent Vitals/Pain No pain    ADL  Eating/Feeding: Simulated;Modified independent Where Assessed - Eating/Feeding: Chair Grooming: Simulated;Modified independent Where Assessed - Grooming: Unsupported sitting Upper Body Bathing: Simulated;Set up Where Assessed - Upper Body Bathing: Unsupported sitting Lower Body Bathing: Simulated;Minimal assistance Where Assessed - Lower Body Bathing: Supported sit to stand Upper Body Dressing: Simulated;Minimal assistance Where Assessed - Upper Body Dressing: Unsupported sitting Lower Body Dressing: Simulated;Minimal assistance Where Assessed - Lower Body Dressing: Supported sit to stand Toilet Transfer: Performed;Minimal assistance Toilet Transfer Method: Sit to stand;Stand pivot Acupuncturist: Comfort height toilet Toileting - Clothing Manipulation and Hygiene: Simulated;Minimal assistance Where Assessed - Engineer, mining and Hygiene: Standing Equipment  Used: Gait belt;Rolling walker Transfers/Ambulation Related to ADLs: ambulation with and without RW.  ADL Comments: Assist needed due to higher level balance, attention and ?visual deficits.     OT Diagnosis: Cognitive deficits;Disturbance of vision;Apraxia  OT Problem List: Impaired balance (sitting and/or standing);Impaired vision/perception;Decreased coordination;Decreased cognition;Decreased safety awareness;Impaired UE functional use OT Treatment Interventions: Self-care/ADL training;Neuromuscular education;DME and/or AE instruction;Therapeutic activities;Cognitive remediation/compensation;Visual/perceptual remediation/compensation;Patient/family education;Balance training   OT Goals Acute Rehab OT Goals OT Goal Formulation: With patient Time For Goal Achievement: 01/19/12 Potential to Achieve Goals: Good ADL Goals Pt Will Perform Lower Body Dressing: with supervision;Sit to stand from chair;Sit to stand from bed ADL Goal: Lower Body Dressing - Progress: Goal set today Pt Will Transfer to Toilet: with supervision;Ambulation;with DME ADL Goal: Toilet Transfer - Progress: Progressing toward goals Pt Will Perform Tub/Shower Transfer: Shower transfer;with supervision;with caregiver independent in assisting;with DME ADL Goal: Tub/Shower Transfer - Progress: Goal set today Additional ADL Goal #1: Pt. will locate items in visually challenging environment with no cues. ADL Goal: Additional Goal #1 - Progress: Goal set today Arm Goals Additional Arm Goal #1: Pt will complete fine/gross motorcoordination written HEP with min vc for RUE. Arm Goal: Additional Goal #1 - Progress: Goal set today  Visit Information  Last OT Received On: 01/05/12 PT/OT Co-Evaluation/Treatment: Yes    Subjective Data      Prior Functioning     Home Living Lives With: Spouse Available Help at Discharge: Family;Available 24 hours/day Type of Home: House Home Access: Stairs to enter [employer]  of Steps: 2 Entrance Stairs-Rails: None Home Layout: One level;Laundry or work area in [employer] Shower/Tub: Tub/shower unit;Walk-in shower Bathroom Toilet: Standard Bathroom Accessibility: Yes How Accessible: Accessible via walker Home Adaptive Equipment: Built-in shower seat (crutches) Prior Function Level of Independence: Independent Able to Take Stairs?: Yes Driving: Yes 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 impairment in Rvisual field) Additional Comments: Pt missed @ 50% targets on scanning task. Most targets in R visual field Perception Perception: Within Functional Limits Praxis Praxis: Impaired Praxis Impairment Details: Perseveration;Motor planning (? high level motor planning deficit)   Cognition  Overall Cognitive Status: Impaired Area of Impairment: Attention;Awareness of errors;Executive functioning Arousal/Alertness: Awake/alert Orientation Level: Appears intact for tasks assessed Behavior During Session: [facility] for tasks performed Current Attention Level: Selective Attention - Other Comments: increased difficualty with distraction Awareness of Errors: Assistance required to identify errors made;Assistance required to correct errors made Awareness of Errors - Other Comments: decreased anticipatory awareness Executive Functioning: apparent deficits with  higher level cognitive skills. will further assess Cognition - Other Comments: Pt. able to alternate attention between reading room numbers, talking with therapist, and talking with visitor    Extremity/Trunk Assessment Right Upper Extremity Assessment RUE ROM/Strength/Tone: [facility] for tasks assessed RUE Sensation: WFL - Light Touch;WFL - Proprioception RUE Coordination: Deficits RUE Coordination Deficits: decreased gross and fine motor Left Upper Extremity Assessment LUE ROM/Strength/Tone: WFL for tasks assessed LUE Sensation: WFL - Light Touch;WFL - Proprioception LUE Coordination: WFL - gross motor;WFL - fine motor Left Lower Extremity Assessment LLE ROM/Strength/Tone: WFL for tasks assessed Trunk Assessment Trunk Assessment: Other exceptions Trunk Exceptions: r lateral lean. Able to come to midline, but unaware of R preference. Pt reports hx of back problems, howeer, feel this is neurological     Mobility Transfers Transfers: Sit to Stand;Stand to Sit Sit to Stand: 4: Min guard;With upper extremity assist;From chair/3-in-1 Stand to Sit: 4: Min guard;With upper extremity assist;To chair/3-in-1 Details for Transfer Assistance: cues for safety. Loss of balance forward and pt making excuses for balance.     Shoulder Instructions     Exercise     Balance     End of Session OT - End of Session Equipment Utilized During Treatment: Gait belt Activity Tolerance: Patient tolerated treatment well Patient left: in chair;with call bell/phone within reach;with family/visitor present Nurse Communication: Mobility status;Precautions  GO     Ashby Moskal,HILLARY 01/05/2012, 12:32 PM [facility], OTR/L  [phone] 01/05/2012

## 2012-01-05 NOTE — Progress Notes (Signed)
Stroke Team Progress Note  History: 76 year old male, with history of HTN, dyslipidemia, hospitalized 12/16/11-12/19/11 for posterior right frontal lobe stroke and fully worked up at that time. Subsequently discharged on plavix. Patient has now presented with new right-sided weakness on 01/04/12 and brain MRI revealed new areas of acute infarction in the left hemisphere.  Subjective: Feels well. No new symptoms overnight. No pain.   Objective: Vital signs in last 24 hours: Filed Vitals:   01/04/12 2114 01/05/12 0126 01/05/12 0530 01/05/12 1115  BP: 154/85 151/93 146/80 144/86  Pulse: 66 67 58 75  Temp: 98.1 F (36.7 C) 97.7 F (36.5 C) 97.2 F (36.2 C) 97.5 F (36.4 C)  TempSrc: Oral Oral Oral Oral  Resp: 18 18 18 18   SpO2: 98% 98% 98% 100%   Intake/Output from previous day: 09/28 0701 - 09/29 0700 In: -  Out: 1150 [Urine:1150]  GENERAL EXAM: Patient is in no distress  CARDIOVASCULAR: Regular rate and rhythm, no murmurs, no carotid bruits  NEUROLOGIC: MENTAL STATUS: awake, alert, language fluent, comprehension intact, naming intact CRANIAL NERVE: pupils equal and reactive to light, visual fields full to confrontation, extraocular muscles intact, no nystagmus, facial sensation and strength symmetric, uvula midline, shoulder shrug symmetric, tongue midline. MOTOR: normal bulk and tone; RUE 4/5 WITH DECR FFM. LUE 5/5. BLE 4/5.  SENSORY: normal and symmetric to light touch COORDINATION: DECR IN RUE  Lab Results: Basename 01/05/12 0530 01/04/12 0652  WBC 5.8 3.8*  HGB 14.9 15.2  HCT 43.9 43.7  PLT 129* 130*   BMET Basename 01/05/12 0530 01/04/12 0652  NA 140 140  K 4.2 3.8  CL 104 104  CO2 29 29  GLUCOSE 105* 109*  BUN 17 20  CREATININE 1.20 1.28  CALCIUM 9.5 9.4   Lipid Panel     Component Value Date/Time   CHOL 150 12/17/2011 0510   TRIG 118 12/17/2011 0510   HDL 33* 12/17/2011 0510   CHOLHDL 4.5 12/17/2011 0510   VLDL 24 12/17/2011 0510   LDLCALC 93 12/17/2011  0510   Lab Results  Component Value Date   HGBA1C 5.4 12/17/2011   Studies/Results:  01/04/2012  CT HEAD - Mild cerebral and cerebellar atrophy.  A well-defined focus of low attenuation in the left cerebellar hemispheres compatible with old remote prior infarction.  Extensive patchy and confluent areas of decreased attenuation throughout the deep and periventricular white matter of the cerebral hemispheres bilaterally is compatible with severe chronic microvascular ischemic changes, similar to prior examinations.  No definite acute intracranial abnormality.  Specifically, no definite evidence of large vascular territory acute/subacute cerebral ischemia, no acute intracerebral hemorrhage, no focal mass, mass effect, definite hydrocephalus or abnormal intra or extra-axial fluid collections. No acute displaced skull fractures are noted.  Visualized paranasal sinuses and mastoids are well pneumatized.  IMPRESSION: 1.  No acute intracranial abnormalities. 2.  The appearance of brain is similar to prior examinations, as detailed above.     01/04/2012  MRI HEAD - New multifocal areas of acute infarction affect the left hemisphere involving the frontal and posterior frontal cortex. The largest area of involvement is in the parasagittal left posterior frontal cortex near the motor strip. There is no visible acute hemorrhage.  Previously described right posterior frontal cortex infarct is resolving.  New punctate infarcts affect the left parietal and left occipital cortex. Given the widespread distribution, embolic strokes should be considered.  There is no mass lesion, hydrocephalus, or extra-axial fluid. Major intracranial vascular structures are patent.  There is advanced atrophy with chronic microvascular ischemic change.  Widespread lacunes are noted.  Chronic brainstem and cerebellar infarcts. Pituitary and cerebellar tonsils unremarkable. Negative calvarium.  No acute sinus or mastoid disease.  IMPRESSION: New  multifocal areas of acute infarction affect the left hemisphere, most notably in a parasagittal location near the motor strip. Considering the different vascular territories involved by the previous recent infarct in the right hemisphere, and today's acute infarcts on the left, a cardiac source should be considered.  Resolving right posterior frontal infarct.  Atrophy with extensive chronic microvascular ischemic change and multiple lacunes.     12/16/11 carotid u/s - No significant extracranial carotid artery stenosis demonstrated. Vertebrals are patent with antegrade flow.  12/17/11 TTE - EF 55-60%; incidental hepatic cyst  12/19/11 TEE - no evidence of thrombus in the atrial cavity or appendage; no PFO  Medications:    . clopidogrel  75 mg Oral Q breakfast  . simvastatin  20 mg Oral q1800    Assessment/Plan: 76 year old male, with history of HTN, dyslipidemia, hospitalized 12/16/11-12/19/11 for posterior right frontal lobe stroke and fully worked up at that time. Subsequently discharged on plavix. Patient has now presented with new right-sided weakness on 01/04/12 and brain MRI revealed new multifocal areas of acute infarction in the left hemisphere.  Dx: suspected cardio-embolic strokes; although TEE negative on 12/19/11.  PLAN: 1. Continue plavix + simvastatin for stroke prevention 2. Continue telemetry monitoring; will continue long term outpatient cardiac monitoring on discharge as well 3. Permissive HTN for next 24-48 hours; currently at goal SBP < 180  [facility] - Stroke Team Joycelyn Schmid, MD 01/05/2012, 3:18 PM   Please refer to [email or web address] for on-call Stroke MD

## 2012-01-05 NOTE — Progress Notes (Signed)
[facility] PROGRESS NOTE  Jeffrey Hardy WUJ:[id number] DOB: 1936-04-03 DOA: 01/04/2012 PCP: Oliver Barre, MD  Assessment/Plan: Active Problems:  HYPERLIPIDEMIA  HYPERTENSION  DIVERTICULOSIS, COLON  Acute ischemic stroke  Acute ischemic stroke with residual right sided weakness and dysmetria and difficulty sitting up, improved today but not back to baseline.  Patient has had two recent CVAs one on each side of the brain suggesting a cardiac etiology.  He had no suggestion of thrombus or PFO or valvular problem on his recent TEE and he had been on outpatient ACT monitor placed by Dr. Myrtis Ser, Cardiology.  The device recorded normal sinus rhythm without events until 11PM the night before his most recent CVA but stopped recording.  I called [employer] to troubleshoot and there is a problem with the battery of the lead component and it needs to be replaced.  Spoke with Dr. Mayford Knife who is on call for [name] cardiology about the ACT monitor who said the device could be replaced tomorrow morning.  He has had some episodes of sinus bradycardia on telemetry in the hospital, but there is high suspicion of arrhythmia given the distribution of his strokes.   -  Continue inpatient monitoring on telemetry pending reinstitution of ACT monitor -  Will need close follow up with Cardiology - Appreciate neurology assistance  - Continue plavix  - PT/OT recommendations are pending - Risk factors were addressed less than two weeks ago. Repeat A1c three months from current. Need to wait several more weeks before rechecking lipid panel after recently starting statin.   Hypertension, diet controlled. Patient's blood pressures are mildly elevated in setting of acute CVA.  -  Continue to monitor and may gradually add blood pressure medications.  It has been just over 24 hours of permissive hypertension.  HLD, LDL 93 on 9/10 (at goal)  - Continue statin   Mild thrombocytopenia, unclear etiology.  Leukopenia resolved  and smear was unremarkable. - Outpatient follow up.  DIET: Healthy heart  ACCESS: PIV  IVF: none  PROPH: lovenox   Code Status: Full code  Family Communication: Spoke with patient and patient's wife regarding plan of care  Disposition Plan: Pending PT and OT recommendations and continued inpatient telemetry until ACT monitor reconnected tomorrow by Cardiology.     [employer] Monitoring Call Center Toll free: [phone] for ACT monitor Troubleshoot: [phone], Re-enrollment: [phone].  Brief narrative: The patient is a 76 yo male with history of HTN, HLD, and recent embolic CVA two weeks ago who presents with right arm and leg weakness. The patient was last at his baseline level of health until just before 12/19/11 when he developed left lower extremity heaviness and numbness with inability to move the leg. He was admitted to [name] and found to have an acute small infarct in the posterior right frontal lobe involving the medial aspect of the right motor strip. He also had prominent small vessel disease and remove cerebellar infarcts bilaterally. MRA revealed intracranial atherosclerosis. Carotid duplex showed no significant extracranial carotid artery stenosis with patent vertebral arteries. TEE and 2D TTE showed preserved EF with grade 1 diastolic dysfunction, AV sclerosis, and no PFO or atrial communication. Patient was discharged on statin and plavix (had previously been on ASA) and was placed on ambulatory event monitor. He felt near his baseline at the time of discharge to home.  The patient states he felt fine when he went to bed and he got up in the middle of the night to use the bathroom, however,  around 5:15 AM, he woke up and realized he could not move his left leg. Shortly afterwards, his right arm became weak. He voided due to full bladder and inability to move to bathroom, but denies shaking or jerking or LOC or confusion. Denies facial droop (wife concurs) or  confusion or slurred speech. He had no numbness. Transported via EMS to [name] where he underwent repeat CT and MRI brain which showed new multifocal areas of acute infarction affecting the left hemisphere, including near the motor strip, suggestive of a cardiac source.    Consultants:  Neurology  PT  OT  Procedures:  CT head 9/28  MRI brain 9/28  Antibiotics:  none  HPI/Subjective:  Patient is feeling much stronger today and has been able to sit up in a chair.  Denies new focal numbness, weakness, slurred speech, facial droop.    Objective: Filed Vitals:   01/04/12 2114 01/05/12 0126 01/05/12 0530 01/05/12 1115  BP: 154/85 151/93 146/80 144/86  Pulse: 66 67 58 75  Temp: 98.1 F (36.7 C) 97.7 F (36.5 C) 97.2 F (36.2 C) 97.5 F (36.4 C)  TempSrc: Oral Oral Oral Oral  Resp: 18 18 18 18   SpO2: 98% 98% 98% 100%    Intake/Output Summary (Last 24 hours) at 01/05/12 1148 Last data filed at 01/05/12 0802  Gross per 24 hour  Intake      0 ml  Output   1300 ml  Net  -1300 ml   There were no vitals filed for this visit.  Exam:  General: Caucasian male, no acute distress  Eyes: PERRL, anicteric, noninjected, EOMI, no nystagmus  ENT: MMM  Cardiovascular: RRR, no murmurs, rubs, or gallops  Respiratory: CTAB  Abdomen: NABS, soft, nondistended, nontender, no organomegaly  Musculoskeletal: Normal tone and bulk  Psychiatric: A&Ox4, speech is at baseline per wife  Neurologic: II-XII grossly intact except acuity and visual fields not tested. Strength 5-/5 right upper and lower extremity and 5/5 left upper and lower extremity. Slow rapid finger tap on the right with some mild dysmetria with finger to nose on right. Left normal. Patient with mild difficulty sitting up with sway to the right.    Data Reviewed: Basic Metabolic Panel:  Lab 01/05/12 1610 01/04/12 0652  NA 140 140  K 4.2 3.8  CL 104 104  CO2 29 29  GLUCOSE 105* 109*  BUN 17 20  CREATININE 1.20  1.28  CALCIUM 9.5 9.4  MG -- --  PHOS -- --   Liver Function Tests:  Lab 01/04/12 0652  AST 19  ALT 21  ALKPHOS 65  BILITOT 0.4  PROT 6.5  ALBUMIN 4.1   No results found for this basename: LIPASE:5,AMYLASE:5 in the last 168 hours No results found for this basename: AMMONIA:5 in the last 168 hours CBC:  Lab 01/05/12 0530 01/04/12 0652  WBC 5.8 3.8*  NEUTROABS -- 2.8  HGB 14.9 15.2  HCT 43.9 43.7  MCV 95.4 95.4  PLT 129* 130*   Cardiac Enzymes: No results found for this basename: CKTOTAL:5,CKMB:5,CKMBINDEX:5,TROPONINI:5 in the last 168 hours BNP (last 3 results) No results found for this basename: PROBNP:3 in the last 8760 hours CBG: No results found for this basename: GLUCAP:5 in the last 168 hours  No results found for this or any previous visit (from the past 240 hour(s)).   Studies: Ct Head Wo Contrast  01/04/2012  *RADIOLOGY REPORT*  Clinical Data: Altered mental status.  Difficulty with speech. Paralysis of right arm.  CT  HEAD WITHOUT CONTRAST  Technique:  Contiguous axial images were obtained from the base of the skull through the vertex without contrast.  Comparison: Brain MRI 12/17/2011.  Head CT 12/16/2011.  Findings: Mild cerebral and cerebellar atrophy.  A well-defined focus of low attenuation in the left cerebellar hemispheres compatible with old remote prior infarction.  Extensive patchy and confluent areas of decreased attenuation throughout the deep and periventricular white matter of the cerebral hemispheres bilaterally is compatible with severe chronic microvascular ischemic changes, similar to prior examinations.  No definite acute intracranial abnormality.  Specifically, no definite evidence of large vascular territory acute/subacute cerebral ischemia, no acute intracerebral hemorrhage, no focal mass, mass effect, definite hydrocephalus or abnormal intra or extra-axial fluid collections. No acute displaced skull fractures are noted.  Visualized paranasal  sinuses and mastoids are well pneumatized.  IMPRESSION: 1.  No acute intracranial abnormalities. 2.  The appearance of brain is similar to prior examinations, as detailed above.   Original Report Authenticated By: Florencia Reasons, M.D.    Mr Brain Wo Contrast  01/04/2012  *RADIOLOGY REPORT*  Clinical Data: Right-sided weakness.  History of hypertension.  MRI HEAD WITHOUT CONTRAST  Technique:  Multiplanar, multiecho pulse sequences of the brain and surrounding structures were obtained according to standard protocol without intravenous contrast.  Comparison: CT head performed earlier in the day.  MRI brain 12/17/2011.  Findings: New multifocal areas of acute infarction affect the left hemisphere involving the frontal and posterior frontal cortex. The largest area of involvement is in the parasagittal left posterior frontal cortex near the motor strip. There is no visible acute hemorrhage.  Previously described right posterior frontal cortex infarct is resolving.  New punctate infarcts affect the left parietal and left occipital cortex. Given the widespread distribution, embolic strokes should be considered.  There is no mass lesion, hydrocephalus, or extra-axial fluid. Major intracranial vascular structures are patent.  There is advanced atrophy with chronic microvascular ischemic change.  Widespread lacunes are noted.  Chronic brainstem and cerebellar infarcts. Pituitary and cerebellar tonsils unremarkable. Negative calvarium.  No acute sinus or mastoid disease.  IMPRESSION: New multifocal areas of acute infarction affect the left hemisphere, most notably in a parasagittal location near the motor strip. Considering the different vascular territories involved by the previous recent infarct in the right hemisphere, and today's acute infarcts on the left, a cardiac source should be considered.  Resolving right posterior frontal infarct.  Atrophy with extensive chronic microvascular ischemic change and multiple  lacunes.   Original Report Authenticated By: Elsie Stain, M.D.     Scheduled Meds:   . clopidogrel  75 mg Oral Q breakfast  . simvastatin  20 mg Oral q1800   Continuous Infusions:   . DISCONTD: sodium chloride      Active Problems:  HYPERLIPIDEMIA  HYPERTENSION  DIVERTICULOSIS, COLON  Acute ischemic stroke    Time spent: 40    Tijuana Scheidegger, [facility]  [facility] Pager [phone]. If 8PM-8AM, please contact night-coverage at [email or web address], password [facility] 01/05/2012, 11:48 AM  LOS: 1 day

## 2012-01-06 DIAGNOSIS — I10 Essential (primary) hypertension: Secondary | ICD-10-CM

## 2012-01-06 LAB — CBC
HCT: 45.1 % (ref 39.0–52.0)
Hemoglobin: 15.9 g/dL (ref 13.0–17.0)
MCH: 33.7 pg (ref 26.0–34.0)
MCHC: 35.3 g/dL (ref 30.0–36.0)
MCV: 95.6 fL (ref 78.0–100.0)
Platelets: 127 10*3/uL — ABNORMAL LOW (ref 150–400)
RBC: 4.72 MIL/uL (ref 4.22–5.81)
RDW: 12.5 % (ref 11.5–15.5)
WBC: 6.3 10*3/uL (ref 4.0–10.5)

## 2012-01-06 LAB — BASIC METABOLIC PANEL
BUN: 19 mg/dL (ref 6–23)
CO2: 27 mEq/L (ref 19–32)
Calcium: 9.6 mg/dL (ref 8.4–10.5)
Chloride: 102 mEq/L (ref 96–112)
Creatinine, Ser: 1.3 mg/dL (ref 0.50–1.35)
GFR calc Af Amer: 61 mL/min — ABNORMAL LOW (ref >90)
GFR calc non Af Amer: 53 mL/min — ABNORMAL LOW (ref >90)
Glucose, Bld: 97 mg/dL (ref 70–99)
Potassium: 3.9 mEq/L (ref 3.5–5.1)
Sodium: 140 mEq/L (ref 135–145)

## 2012-01-06 MED ORDER — ASPIRIN 81 MG PO CHEW: 81.0000 mg | CHEWABLE_TABLET | Freq: Every day | ORAL | Status: DC

## 2012-01-06 MED ORDER — ASPIRIN-DIPYRIDAMOLE ER 25-200 MG PO CP12
1.0000 | ORAL_CAPSULE | Freq: Every day | ORAL | Status: DC
  Filled 2012-01-06: qty 1

## 2012-01-06 MED ORDER — ASPIRIN-DIPYRIDAMOLE ER 25-200 MG PO CP12: 1.0000 | ORAL_CAPSULE | Freq: Two times a day (BID) | ORAL | Status: DC

## 2012-01-06 MED ORDER — LISINOPRIL 5 MG PO TABS: 5.0000 mg | ORAL_TABLET | Freq: Every day | ORAL | Status: DC

## 2012-01-06 MED ORDER — ACETAMINOPHEN 325 MG PO TABS: 650.0000 mg | ORAL_TABLET | Freq: Every day | ORAL | Status: DC

## 2012-01-06 MED ORDER — ASPIRIN-DIPYRIDAMOLE ER 25-200 MG PO CP12: 1.0000 | ORAL_CAPSULE | Freq: Every day | ORAL | Status: DC

## 2012-01-06 NOTE — Discharge Summary (Signed)
Physician Discharge Summary  Jeffrey Hardy OZH:[id number] DOB: [birth year] DOA: 01/04/2012  PCP: Oliver Barre, MD  Admit date: 01/04/2012 Discharge date: 01/06/2012  Recommendations for Outpatient Follow-up:  1. Case management to assist with home health PT and rolling walker and outpatient OT 2. Follow up with Neurology within 1-2 months 3. Follow up with Cardiology regarding ACT monitor at your previously scheduled appointment. 4. Follow up with primary care doctor within 1-2 week for blood pressure check with electrolytes as starting an ACEI, repeat lipid panel, and repeat CBC to determine if thrombocytopenia is persistent. 5. Family to be called by Marcos Eke from [facility] to get new ACT monitor set up.    Discharge Diagnoses:  Active Problems:  HYPERLIPIDEMIA  HYPERTENSION  DIVERTICULOSIS, COLON  Acute ischemic stroke   Discharge Condition:  Stable, improved  Diet recommendation: healthy heart  Wt Readings from Last 3 Encounters:  01/05/12 95.312 kg (210 lb 2 oz)  12/26/11 95.312 kg (210 lb 2 oz)  12/16/11 97.523 kg (215 lb)    History of present illness:   The patient is a 76 yo male with history of HTN, HLD, and recent embolic CVA two weeks ago who presents with right arm and leg weakness. The patient was last at his baseline level of health until just before 12/19/11 when he developed left lower extremity heaviness and numbness with inability to move the leg. He was admitted to [facility] and found to have an acute small infarct in the posterior right frontal lobe involving the medial aspect of the right motor strip. He also had prominent small vessel disease and remove cerebellar infarcts bilaterally. MRA revealed intracranial atherosclerosis. Carotid duplex showed no significant extracranial carotid artery stenosis with patent vertebral arteries. TEE and 2D TTE showed preserved EF with grade 1 diastolic dysfunction, AV sclerosis, and no PFO or atrial  communication. Patient was discharged on statin and plavix (had previously been on ASA) and was placed on ambulatory event monitor. He felt near his baseline at the time of discharge to home.   The patient states he felt fine when he went to bed and he got up in the middle of the night to use the bathroom, however, around 5:15 AM, he woke up and realized he could not move his left leg. Shortly afterwards, his right arm became weak. He voided due to full bladder and inability to move to bathroom, but denies shaking or jerking or LOC or confusion. Denies facial droop (wife concurs) or confusion or slurred speech. He had no numbness. Transported via EMS to [facility] where he underwent repeat CT and MRI brain which showed new multifocal areas of acute infarction affecting the left hemisphere, including near the motor strip, suggestive of a cardiac source.    Hospital Course:   Jeffrey Hardy presented with multifocal areas of acute infarction in the left hemisphere with residual right sided weakness and dysmetria and difficulty sitting up.  Given that he has had two multifocal acute strokes in both hemispheres, it is likely he has a cardiogenic source of emboli, however, his recent TEE was unremarkable and he had no PFO.  He was placed on outpatient ACT monitor placed by Dr. Myrtis Ser, Cardiology, prior to admission which showed no arrhythmias during the time it recorded, although several hours from the date of stroke were not recorded.  Telemetry during admission showed only sinus rhythm with occasional mild sinus bradycardia without AV block, and one episode of sinus tachycardia likely related to blood  draw.  Neurology recommended changing plavix to aggrenox with the following instructions:   -  Stop plavix - Take ASA 81mg  qAM x 2 weeks, then stop - Take Aggrenox 1 tab qHS x 2 weeks, then start taking BID - Take tylenol 650mg  1 hour prior to taking aggrenox during the first week, then discontinue  Physical  therapy recommended home health PT with rolling walker and he should have outpatient OT services.    Risk factors were addressed less than two weeks prior to admission.  The patient should have a repeat A1c three months from today.  He should continue diet and exercise and continue taking his statin which was started 2 weeks ago and follow up as an outpatient in 2 to 4 weeks for repeat lipid panel.  LDL 93 on 9/10 (at goal)   Hypertension, previously diet controlled, but blood pressures were consistently elevated in the 140s to 150s systolic prior to discharge so he should start lisinopril 5mg  daily and follow up for a repeat blood pressure check in 1 week with his primary care doctor.   Mild thrombocytopenia, unclear etiology. Leukopenia resolved and smear was unremarkable.  Primary care doctor to trend and evaluate further if it persists.     Consultants:  Neurology  PT  OT Procedures:  CT head 9/28  MRI brain 9/28 Antibiotics:  none    Discharge Exam: Filed Vitals:   01/06/12 0900  BP: 149/91  Pulse: 76  Temp: 98.2 F (36.8 C)  Resp: 18   Filed Vitals:   01/05/12 2234 01/06/12 0218 01/06/12 0613 01/06/12 0900  BP: 152/87 152/84 153/88 149/91  Pulse: 64 63 61 76  Temp: 97.7 F (36.5 C) 98.2 F (36.8 C) 98.1 F (36.7 C) 98.2 F (36.8 C)  TempSrc: Oral Oral Oral Oral  Resp: 19 18 18 18   Height:      Weight:      SpO2: 95% 96% 98% 100%    General: Caucasian male, no acute distress  Eyes: PERRL, anicteric, noninjected, EOMI, no nystagmus  Cardiovascular: RRR, no murmurs, rubs, or gallops  Respiratory: CTAB  Abdomen: NABS, soft, nondistended, nontender, no organomegaly  Psychiatric: A&Ox4, speech is normal Neurologic: II-XII grossly intact except acuity and visual fields not tested. Strength 5-/5 right upper and lower extremity and 5/5 left upper and lower extremity. Slow rapid finger tap on the right with some mild dysmetria with finger to nose on right. Left normal.  Patient with mild sway to the right when sitting or standing.     Discharge Instructions      Discharge Orders    Future Appointments: Provider: Department: Dept Phone: Center:   12/29/2012 1:00 PM Corwin Levins, MD [facility] [phone] [facility]     Future Orders Please Complete By Expires   Diet - low sodium heart healthy      Increase activity slowly      Discharge instructions      Comments:   You were hospitalized with a multifocal stroke on the left side your brain.  To minimize your chance of having a future stroke, please continue to eat a healthy diet and exercise regularly.  Also, your plavix was stopped and you should start aggrenox.  The side effect of aggrenox is headache.  For the next two weeks, please take an 81mg  aspirin in the morning then tylenol 650mg  in the evening about an hour before you take 1 tab of aggrenox.  The tylenol should minimize the headache.  After two weeks,  stop the morning aspirin and start taking an aggrenox tab once in the morning and once at night.  You can continue to take tylenol an hour before each dose as needed to prevent headache.  Also, your blood pressure was persistently elevated.  Please start taking lisinopril 5mg  once a day to lower blood pressure.  Please follow up with your primary care doctor in one to two weeks to have labs done, your blood pressure checked.  Finally, your platelet count was mildly low but your platelets looked normal under the microscope. This may have been a temporary change due to your illness/hospitalization so your primary care doctor may recheck your platelet level at a future visit.   Call MD for:      Comments:   Call 911 if you have chest pain, shortness of breath, slurred speech, numbness or weakness of an arm or leg, or facial droop.   Call MD for:  temperature >100.4      Call MD for:  persistant nausea and vomiting      Call MD for:  severe uncontrolled pain      Call MD for:  difficulty breathing, headache  or visual disturbances      Call MD for:  hives      Call MD for:  persistant dizziness or light-headedness      Call MD for:  extreme fatigue          Medication List     As of 01/06/2012  1:35 PM    STOP taking these medications         clopidogrel 75 MG tablet   Commonly known as: PLAVIX      TAKE these medications         acetaminophen 325 MG tablet   Commonly known as: TYLENOL   Take 2 tablets (650 mg total) by mouth at bedtime.      aspirin 81 MG chewable tablet   Chew 1 tablet (81 mg total) by mouth daily.      dipyridamole-aspirin 200-25 MG per 12 hr capsule   Commonly known as: AGGRENOX   Take 1 capsule by mouth at bedtime.      dipyridamole-aspirin 200-25 MG per 12 hr capsule   Commonly known as: AGGRENOX   Take 1 capsule by mouth 2 (two) times daily.      lisinopril 5 MG tablet   Commonly known as: PRINIVIL,ZESTRIL   Take 1 tablet (5 mg total) by mouth daily.      omeprazole 20 MG capsule   Commonly known as: PRILOSEC   Take 20 mg by mouth daily as needed.      simvastatin 20 MG tablet   Commonly known as: ZOCOR   Take 1 tablet (20 mg total) by mouth daily at 6 PM.        Follow-up Information    Follow up with [facility]. Schedule an appointment as soon as possible for a visit in 1 month.   Contact information:   [address] [place] [address] [phone]      Follow up with Oliver Barre, MD. Schedule an appointment as soon as possible for a visit in 1 week.   Contact information:   [address] Maggie Schwalbe [place] [address] [phone]       Follow up with Willa Rough, MD. (previously scheduled appointment or within 2 weeks.  You should be called by Windell Moulding today and if not, please  call the clinic and ask to speak to her regarding the ACT monitor.)    Contact information:   [address] [place] [address] [phone]           The results of significant diagnostics  from this hospitalization (including imaging, microbiology, ancillary and laboratory) are listed below for reference.    Significant Diagnostic Studies: Dg Chest 2 View  12/16/2011  *RADIOLOGY REPORT*  Clinical Data: Shortness of breath, weakness  CHEST - 2 VIEW  Comparison: None.  Findings: The lungs are clear but somewhat hyperaerated possibly indicated COPD.  Mild apical pleural thickening is present.  The heart is within upper limits of normal in size for age.  The bones are osteopenic.  IMPRESSION: No active lung disease.  Hyperaeration may indicate COPD.   Original Report Authenticated By: Juline Patch, M.D.    Ct Head Wo Contrast  01/04/2012  *RADIOLOGY REPORT*  Clinical Data: Altered mental status.  Difficulty with speech. Paralysis of right arm.  CT HEAD WITHOUT CONTRAST  Technique:  Contiguous axial images were obtained from the base of the skull through the vertex without contrast.  Comparison: Brain MRI 12/17/2011.  Head CT 12/16/2011.  Findings: Mild cerebral and cerebellar atrophy.  A well-defined focus of low attenuation in the left cerebellar hemispheres compatible with old remote prior infarction.  Extensive patchy and confluent areas of decreased attenuation throughout the deep and periventricular white matter of the cerebral hemispheres bilaterally is compatible with severe chronic microvascular ischemic changes, similar to prior examinations.  No definite acute intracranial abnormality.  Specifically, no definite evidence of large vascular territory acute/subacute cerebral ischemia, no acute intracerebral hemorrhage, no focal mass, mass effect, definite hydrocephalus or abnormal intra or extra-axial fluid collections. No acute displaced skull fractures are noted.  Visualized paranasal sinuses and mastoids are well pneumatized.  IMPRESSION: 1.  No acute intracranial abnormalities. 2.  The appearance of brain is similar to prior examinations, as detailed above.   Original Report  Authenticated By: Florencia Reasons, M.D.    Ct Head Wo Contrast  12/16/2011  *RADIOLOGY REPORT*  Clinical Data: Left leg weakness.  Left leg and foot numbness.  CT HEAD WITHOUT CONTRAST  Technique:  Contiguous axial images were obtained from the base of the skull through the vertex without contrast.  Comparison: None.  Findings: The brain shows generalized atrophy.  There is extensive chronic small vessel disease throughout the brain.  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  There are old small vessel changes affecting the pons.  The cerebral hemispheres show confluent chronic small vessel disease throughout the white matter.  Old small vessel insults effect the thalami and basal ganglia.  No old or acute cortical or large vessel territory infarction is evident.  With respect to the white matter disease, an acute or subacute small vessel infarction could be hidden amongst the extensive chronic changes.  No evidence of mass lesion, hemorrhage, hydrocephalus or extra-axial collection.  No calvarial abnormality.  No inflammatory sinus disease.  There is atherosclerotic calcification of the major vessels at the base of the brain.  IMPRESSION: No identifiably acute finding.  No intracranial hemorrhage. Advanced chronic atrophy and small vessel disease throughout the brain.   Original Report Authenticated By: Thomasenia Sales, M.D.    Mr Brain Wo Contrast  01/04/2012  *RADIOLOGY REPORT*  Clinical Data: Right-sided weakness.  History of hypertension.  MRI HEAD WITHOUT CONTRAST  Technique:  Multiplanar, multiecho pulse sequences of the  brain and surrounding structures were obtained according to standard protocol without intravenous contrast.  Comparison: CT head performed earlier in the day.  MRI brain 12/17/2011.  Findings: New multifocal areas of acute infarction affect the left hemisphere involving the frontal and posterior frontal cortex. The largest area of involvement is in the  parasagittal left posterior frontal cortex near the motor strip. There is no visible acute hemorrhage.  Previously described right posterior frontal cortex infarct is resolving.  New punctate infarcts affect the left parietal and left occipital cortex. Given the widespread distribution, embolic strokes should be considered.  There is no mass lesion, hydrocephalus, or extra-axial fluid. Major intracranial vascular structures are patent.  There is advanced atrophy with chronic microvascular ischemic change.  Widespread lacunes are noted.  Chronic brainstem and cerebellar infarcts. Pituitary and cerebellar tonsils unremarkable. Negative calvarium.  No acute sinus or mastoid disease.  IMPRESSION: New multifocal areas of acute infarction affect the left hemisphere, most notably in a parasagittal location near the motor strip. Considering the different vascular territories involved by the previous recent infarct in the right hemisphere, and today's acute infarcts on the left, a cardiac source should be considered.  Resolving right posterior frontal infarct.  Atrophy with extensive chronic microvascular ischemic change and multiple lacunes.   Original Report Authenticated By: Elsie Stain, M.D.    Mr Brain Wo Contrast  12/18/2011  *RADIOLOGY REPORT*  Clinical Data:  Heaviness and numbness left lower extremities since yesterday.  Hyperlipidemic hypertensive patient.  MRI BRAIN WITHOUT CONTRAST MRA HEAD WITHOUT CONTRAST  Technique: Multiplanar, multiecho pulse sequences of the brain and surrounding structures were obtained according to standard protocol without intravenous contrast.  Angiographic images of the head were obtained using MRA technique without contrast.  Comparison: 12/16/2011 CT.  No comparison MR.  MRI HEAD  Findings:  Acute small non hemorrhagic infarct posterior right frontal lobe involving the medial aspect of the right motor strip.  Prominent small vessel disease type changes.  Remote small cerebellar  infarcts bilaterally.  Tiny areas of blood breakdown products most notable left cerebellum most likely related to prior episodes hemorrhagic ischemia.  Global atrophy with ventricular prominence felt to be related to atrophy rather than hydrocephalus.  No intracranial mass lesion detected on this unenhanced exam.  Cervical spondylotic changes with mild spinal stenosis C2-3 and C3- 4.  Minimal to mild paranasal sinus mucosal thickening.  IMPRESSION: Acute small non hemorrhagic infarct posterior right frontal lobe involving the medial aspect of the right motor strip.  This has been made a PRA call report utilizing dashboard call feature.  MRA HEAD  Findings: Aplastic A1 segment of the right anterior cerebral artery.  Mild irregularity of the right internal carotid artery cavernous and supraclinoid segment.  Mild irregularity M1 segment of the right middle cerebral artery.  Middle cerebral artery mild branch vessel irregularity bilaterally.  Ectatic anterior cerebral artery/anterior communicating artery region.  Small bulge M1 segment left middle cerebral artery and distal M1 segment right middle cerebral artery may represent origin of a vessel although difficult to exclude tiny aneurysm.  Ectatic vertebral arteries with areas of mild narrowing irregularity without high-grade stenosis.  Ectatic basilar artery with mild to moderate narrowing.  Nonvisualization PICAs.  Superior cerebellar artery and posterior cerebral artery branch vessel irregularity bilaterally.  IMPRESSION: Intracranial atherosclerotic type changes as noted above.  Small bulge M1 segment left middle cerebral artery and distal M1 segment right middle cerebral artery may represent origin of a vessel although difficult to exclude tiny  aneurysm.   Original Report Authenticated By: Fuller Canada, M.D.    Mr Mra Head/brain Wo Cm  12/18/2011  *RADIOLOGY REPORT*  Clinical Data:  Heaviness and numbness left lower extremities since yesterday.   Hyperlipidemic hypertensive patient.  MRI BRAIN WITHOUT CONTRAST MRA HEAD WITHOUT CONTRAST  Technique: Multiplanar, multiecho pulse sequences of the brain and surrounding structures were obtained according to standard protocol without intravenous contrast.  Angiographic images of the head were obtained using MRA technique without contrast.  Comparison: 12/16/2011 CT.  No comparison MR.  MRI HEAD  Findings:  Acute small non hemorrhagic infarct posterior right frontal lobe involving the medial aspect of the right motor strip.  Prominent small vessel disease type changes.  Remote small cerebellar infarcts bilaterally.  Tiny areas of blood breakdown products most notable left cerebellum most likely related to prior episodes hemorrhagic ischemia.  Global atrophy with ventricular prominence felt to be related to atrophy rather than hydrocephalus.  No intracranial mass lesion detected on this unenhanced exam.  Cervical spondylotic changes with mild spinal stenosis C2-3 and C3- 4.  Minimal to mild paranasal sinus mucosal thickening.  IMPRESSION: Acute small non hemorrhagic infarct posterior right frontal lobe involving the medial aspect of the right motor strip.  This has been made a PRA call report utilizing dashboard call feature.  MRA HEAD  Findings: Aplastic A1 segment of the right anterior cerebral artery.  Mild irregularity of the right internal carotid artery cavernous and supraclinoid segment.  Mild irregularity M1 segment of the right middle cerebral artery.  Middle cerebral artery mild branch vessel irregularity bilaterally.  Ectatic anterior cerebral artery/anterior communicating artery region.  Small bulge M1 segment left middle cerebral artery and distal M1 segment right middle cerebral artery may represent origin of a vessel although difficult to exclude tiny aneurysm.  Ectatic vertebral arteries with areas of mild narrowing irregularity without high-grade stenosis.  Ectatic basilar artery with mild to  moderate narrowing.  Nonvisualization PICAs.  Superior cerebellar artery and posterior cerebral artery branch vessel irregularity bilaterally.  IMPRESSION: Intracranial atherosclerotic type changes as noted above.  Small bulge M1 segment left middle cerebral artery and distal M1 segment right middle cerebral artery may represent origin of a vessel although difficult to exclude tiny aneurysm.   Original Report Authenticated By: Fuller Canada, M.D.     Microbiology: No results found for this or any previous visit (from the past 240 hour(s)).   Labs: Basic Metabolic Panel:  Lab 01/06/12 3474 01/05/12 0530 01/04/12 0652  NA 140 140 140  K 3.9 4.2 3.8  CL 102 104 104  CO2 27 29 29   GLUCOSE 97 105* 109*  BUN 19 17 20   CREATININE 1.30 1.20 1.28  CALCIUM 9.6 9.5 9.4  MG -- -- --  PHOS -- -- --   Liver Function Tests:  Lab 01/04/12 0652  AST 19  ALT 21  ALKPHOS 65  BILITOT 0.4  PROT 6.5  ALBUMIN 4.1   No results found for this basename: LIPASE:5,AMYLASE:5 in the last 168 hours No results found for this basename: AMMONIA:5 in the last 168 hours CBC:  Lab 01/06/12 0535 01/05/12 0530 01/04/12 0652  WBC 6.3 5.8 3.8*  NEUTROABS -- -- 2.8  HGB 15.9 14.9 15.2  HCT 45.1 43.9 43.7  MCV 95.6 95.4 95.4  PLT 127* 129* 130*   Cardiac Enzymes: No results found for this basename: CKTOTAL:5,CKMB:5,CKMBINDEX:5,TROPONINI:5 in the last 168 hours BNP: BNP (last 3 results) No results found for this basename: PROBNP:3 in  the last 8760 hours CBG: No results found for this basename: GLUCAP:5 in the last 168 hours  Time coordinating discharge: 45 minutes  Signed:  Solina Heron  [facility] 01/06/2012, 1:35 PM

## 2012-01-06 NOTE — Progress Notes (Signed)
Stroke Team Progress Note  History: 76 year old male, with history of HTN, dyslipidemia, hospitalized 12/16/11-12/19/11 for posterior right frontal lobe stroke and fully worked up at that time. Subsequently discharged on plavix. Patient has now presented with new right-sided weakness on 01/04/12 and brain MRI revealed new areas of acute infarction in the left hemisphere.  Subjective: Wife at bedside. Patient here ~ 3 weeks ago. Wife reports left foot cold - wonders if we should check for VTE.  Objective: Vital signs in last 24 hours: Filed Vitals:   01/05/12 2234 01/06/12 0218 01/06/12 0613 01/06/12 0900  BP: 152/87 152/84 153/88 149/91  Pulse: 64 63 61 76  Temp: 97.7 F (36.5 C) 98.2 F (36.8 C) 98.1 F (36.7 C) 98.2 F (36.8 C)  TempSrc: Oral Oral Oral Oral  Resp: 19 18 18 18   Height:      Weight:      SpO2: 95% 96% 98% 100%   Intake/Output from previous day: 09/29 0701 - 09/30 0700 In: -  Out: 650 [Urine:650]  GENERAL EXAM: Patient is in no distress  CARDIOVASCULAR: Regular rate and rhythm, no murmurs, no carotid bruits  NEUROLOGIC: MENTAL STATUS: awake, alert, language fluent, comprehension intact, naming intact CRANIAL NERVE: pupils equal and reactive to light, visual fields full to confrontation, extraocular muscles intact, no nystagmus, facial sensation and strength symmetric, uvula midline, shoulder shrug symmetric, tongue midline. MOTOR: normal bulk and tone; RUE 4/5 WITH DECR FFM. LUE 5/5. BLE 4/5.  SENSORY: normal and symmetric to light touch COORDINATION: DECR IN RUE  Lab Results:  Basename 01/06/12 0535 01/05/12 0530  WBC 6.3 5.8  HGB 15.9 14.9  HCT 45.1 43.9  PLT 127* 129*   BMET  Basename 01/06/12 0535 01/05/12 0530  NA 140 140  K 3.9 4.2  CL 102 104  CO2 27 29  GLUCOSE 97 105*  BUN 19 17  CREATININE 1.30 1.20  CALCIUM 9.6 9.5   Lipid Panel     Component Value Date/Time   CHOL 150 12/17/2011 0510   TRIG 118 12/17/2011 0510   HDL 33*  12/17/2011 0510   CHOLHDL 4.5 12/17/2011 0510   VLDL 24 12/17/2011 0510   LDLCALC 93 12/17/2011 0510   Lab Results  Component Value Date   HGBA1C 5.4 12/17/2011   Studies/Results:  01/04/2012  CT HEAD - Mild cerebral and cerebellar atrophy.  A well-defined focus of low attenuation in the left cerebellar hemispheres compatible with old remote prior infarction.  Extensive patchy and confluent areas of decreased attenuation throughout the deep and periventricular white matter of the cerebral hemispheres bilaterally is compatible with severe chronic microvascular ischemic changes, similar to prior examinations.  No definite acute intracranial abnormality.  Specifically, no definite evidence of large vascular territory acute/subacute cerebral ischemia, no acute intracerebral hemorrhage, no focal mass, mass effect, definite hydrocephalus or abnormal intra or extra-axial fluid collections. No acute displaced skull fractures are noted.  Visualized paranasal sinuses and mastoids are well pneumatized.  IMPRESSION: 1.  No acute intracranial abnormalities. 2.  The appearance of brain is similar to prior examinations, as detailed above.     01/04/2012  MRI HEAD - New multifocal areas of acute infarction affect the left hemisphere involving the frontal and posterior frontal cortex. The largest area of involvement is in the parasagittal left posterior frontal cortex near the motor strip. There is no visible acute hemorrhage.  Previously described right posterior frontal cortex infarct is resolving.  New punctate infarcts affect the left parietal and left  occipital cortex. Given the widespread distribution, embolic strokes should be considered.  There is no mass lesion, hydrocephalus, or extra-axial fluid. Major intracranial vascular structures are patent.  There is advanced atrophy with chronic microvascular ischemic change.  Widespread lacunes are noted.  Chronic brainstem and cerebellar infarcts. Pituitary and cerebellar  tonsils unremarkable. Negative calvarium.  No acute sinus or mastoid disease.  IMPRESSION: New multifocal areas of acute infarction affect the left hemisphere, most notably in a parasagittal location near the motor strip. Considering the different vascular territories involved by the previous recent infarct in the right hemisphere, and today's acute infarcts on the left, a cardiac source should be considered.  Resolving right posterior frontal infarct.  Atrophy with extensive chronic microvascular ischemic change and multiple lacunes.     12/16/11 carotid u/s - No significant extracranial carotid artery stenosis demonstrated. Vertebrals are patent with antegrade flow.  12/17/11 TTE - EF 55-60%; incidental hepatic cyst  12/19/11 TEE - no evidence of thrombus in the atrial cavity or appendage; no PFO  Medications:     . clopidogrel  75 mg Oral Q breakfast  . simvastatin  20 mg Oral q1800    Assessment/Plan: 76 year old male, with history of HTN, dyslipidemia, hospitalized 12/16/11-12/19/11 for posterior right frontal lobe stroke and fully worked up at that time. Subsequently discharged on plavix. Patient has now presented with new right-sided weakness on 01/04/12 and brain MRI revealed new multifocal areas of acute infarction in the left hemisphere again of embolic source but no source identified on prior testing.[name]    Dx: suspected cardio-embolic strokes; although TEE negative on 12/19/11 and no other embolic source found  Hypertension  Hyperlipidemia,  on statin  HgbA1c 5.4  Anxiety  Depression No indication for LE dopplers given exam, hx  PLAN: 1. Change to Aggrenox for secondary stroke prevention. To prevent headache, most common side effect of Aggrenox, will start Aggrenox q hs x 2 weeks then increase Aggrenox to bid.  Aspirin 81 mg q am x 2 weeks the discontinue. May take Tylenol 650 mg 1 hr prior to Aggrenox for the first week, then discontinue.  2. Continue telemetry monitoring; current  outpatient cardiac monitoring malfunctioned Fri night per wife, rep from company plans to replace. Told her they would "refit" pt.   Annie Main, MSN, RN, ANVP-BC, ANP-BC, [facility] Pager: [phone] 01/06/2012 11:28 AM  Scribe for Dr. Delia Heady, Stroke Center Medical Director, who has personally reviewed chart, pertinent data, examined the patient and developed the plan of care. Pager:  [phone] '

## 2012-01-06 NOTE — Progress Notes (Signed)
Physical Therapy Treatment Patient Details Name: Jeffrey Hardy MRN: [id number] DOB: [birth year] Today's Date: 01/06/2012 Time: 4098-1191 PT Time Calculation (min): 27 min  PT Assessment / Plan / Recommendation Comments on Treatment Session  RLE continues to improve, yet still with deficits and tendency to catch as going up steps (or shuffle/drag when fatigued with walking). Will benefit from HHPT to address safety in home environment and then transistion to OPPT. Wife and pt aware of plan    Follow Up Recommendations  Home health PT;Supervision for mobility/OOB    Barriers to Discharge        Equipment Recommendations  Rolling walker with 5" wheels    Recommendations for Other Services    Frequency Min 4X/week   Plan Discharge plan remains appropriate;Frequency remains appropriate    Precautions / Restrictions Precautions Precautions: Fall   Pertinent Vitals/Pain Denies pain     Mobility  Bed Mobility Bed Mobility: Not assessed Transfers Transfers: Sit to Stand;Stand to Sit Sit to Stand: 4: Min guard;With upper extremity assist;With armrests;From chair/3-in-1 Stand to Sit: 4: Min guard;With upper extremity assist;To chair/3-in-1 Details for Transfer Assistance: supervision for safety due to decr awareness of deficits; no loss of balance Ambulation/Gait Ambulation/Gait Assistance: 4: Min guard Ambulation Distance (Feet): 250 Feet Assistive device: Rolling walker;None Ambulation/Gait Assistance Details: Pt ambulated first 200 ft with RW with cues to keep RW closer to his body and feet inside the RW when turning (he tends to push RW too far ahead). Last 50 ft, pt and wife wanted to try without RW. continued with shuffle steps on the right (especially as he fatigues). Gait Pattern: Step-through pattern;Decreased stride length;Right foot flat;Shuffle;Wide base of support Gait velocity: decr Stairs: Yes Stairs Assistance: 4: Min assist Stairs Assistance Details (indicate cue  type and reason): Pt intially tried to go step-over-step (lt then Rt) and caught his Rt toe on the step and pitched forward, catching himself with the rail in his Rt hand and the stair with his left hand (and PT holding gait belt at his waist to support him). Repeated up/down 2 steps x 6 with various methods and with wife ultimately assisting him to find technique that worked the best for them (they plan to use a crutch on his left side and wife's HHA on his Rt--instead of RW turned sideways like a rail on his left or up backwards with the RW) Stair Management Technique: Step to pattern;Backwards;Forwards;With walker;Other (comment) (simulated with crutch (PT and wife on either side)) Number of Stairs: 14  (2 x 7 reps) Modified Rankin (Stroke Patients Only) Pre-Morbid Rankin Score: Slight disability Modified Rankin: Moderately severe disability    Exercises     PT Diagnosis:    PT Problem List:   PT Treatment Interventions:     PT Goals Acute Rehab PT Goals Pt will go Sit to Stand: with modified independence PT Goal: Sit to Stand - Progress: Progressing toward goal Pt will go Stand to Sit: with modified independence PT Goal: Stand to Sit - Progress: Progressing toward goal Pt will Ambulate: >150 feet;with modified independence;with least restrictive assistive device PT Goal: Ambulate - Progress: Progressing toward goal Pt will Go Up / Down Stairs: 1-2 stairs;with rail(s);with modified independence PT Goal: Up/Down Stairs - Progress: Progressing toward goal  Visit Information  Last PT Received On: 01/06/12 Assistance Needed: +1    Subjective Data  Subjective: Wife reports they have a crutch that they used to go up the steps before (one crutch with wife on the  other side) Patient Stated Goal: home   Cognition  Overall Cognitive Status: Impaired Area of Impairment: Safety/judgement;Awareness of errors;Awareness of deficits Arousal/Alertness: Awake/alert Orientation Level: Appears  intact for tasks assessed Behavior During Session: [facility] for tasks performed Safety/Judgement: Decreased safety judgement for tasks assessed;Decreased awareness of need for assistance Awareness of Errors - Other Comments: decreased anticipatory awareness    Balance     End of Session PT - End of Session Equipment Utilized During Treatment: Gait belt Activity Tolerance: Patient tolerated treatment well Patient left: in chair;with call bell/phone within reach;with family/visitor present   GP     Yaritzel Stange 01/06/2012, 3:39 PM Pager [phone]

## 2012-01-06 NOTE — Care Management Note (Signed)
    Page 1 of 1   01/06/2012     11:36:45 AM   CARE MANAGEMENT NOTE 01/06/2012  Patient:  Jeffrey Hardy, Jeffrey Hardy   Account Number:  [id number]  Date Initiated:  01/06/2012  Documentation initiated by:  Onnie Boer  Subjective/Objective Assessment:   PT WAS ADMITTED FOR WEAKNESS     Action/Plan:   PROGRESSION OF CARE AND DISCHARGE PLANNING   Anticipated DC Date:  01/06/2012   Anticipated DC Plan:  HOME W HOME HEALTH SERVICES      DC Planning Services  CM consult      Choice offered to / List presented to:             Status of service:  In process, will continue to follow Medicare Important Message given?   (If response is "NO", the following Medicare IM given date fields will be blank) Date Medicare IM given:   Date Additional Medicare IM given:    Discharge Disposition:    Per UR Regulation:  Reviewed for med. necessity/level of care/duration of stay  If discussed at Long Length of Stay Meetings, dates discussed:    Comments:  01/06/12 Onnie Boer, RN ,BSN 1135 PT WAS ADMITTED WITH WEAKNESS, PTA PT WAS AT HOME WITH SELF CARE.  RECOMMENDATION IS FOR HH AND A RW.  WILL F/U ON AGENCY CHOICE.

## 2012-01-06 NOTE — Telephone Encounter (Signed)
Will make sure

## 2012-01-06 NOTE — Telephone Encounter (Signed)
Have forwarded to Windell Moulding to make sure monitor to be done and spoke with Lela and message received, office visit will be scheduled.    Will send to [facility] with Dr Tenny Craw as well

## 2012-01-06 NOTE — Progress Notes (Signed)
Patient and wife given stroke D/C instructions. Both verbalized understanding, and all questions answered to patient's satisfaction. Patient D/C home with rolling walker in no signs of acute distress.

## 2012-01-13 ENCOUNTER — Encounter: Payer: Self-pay | Admitting: Internal Medicine

## 2012-01-13 ENCOUNTER — Ambulatory Visit (INDEPENDENT_AMBULATORY_CARE_PROVIDER_SITE_OTHER): Payer: Medicare Other | Admitting: Internal Medicine

## 2012-01-13 ENCOUNTER — Other Ambulatory Visit (INDEPENDENT_AMBULATORY_CARE_PROVIDER_SITE_OTHER): Payer: Medicare Other

## 2012-01-13 VITALS — BP 100/62 | HR 75 | Temp 97.6°F | Ht 73.0 in | Wt 207.5 lb

## 2012-01-13 DIAGNOSIS — D696 Thrombocytopenia, unspecified: Secondary | ICD-10-CM

## 2012-01-13 DIAGNOSIS — I10 Essential (primary) hypertension: Secondary | ICD-10-CM

## 2012-01-13 DIAGNOSIS — I639 Cerebral infarction, unspecified: Secondary | ICD-10-CM

## 2012-01-13 DIAGNOSIS — I6350 Cerebral infarction due to unspecified occlusion or stenosis of unspecified cerebral artery: Secondary | ICD-10-CM

## 2012-01-13 DIAGNOSIS — E785 Hyperlipidemia, unspecified: Secondary | ICD-10-CM

## 2012-01-13 LAB — CBC WITH DIFFERENTIAL/PLATELET
Basophils Absolute: 0 10*3/uL (ref 0.0–0.1)
Basophils Relative: 0.5 % (ref 0.0–3.0)
Eosinophils Absolute: 0.1 10*3/uL (ref 0.0–0.7)
Eosinophils Relative: 1.8 % (ref 0.0–5.0)
HCT: 45.9 % (ref 39.0–52.0)
Hemoglobin: 15.2 g/dL (ref 13.0–17.0)
Lymphocytes Relative: 22.3 % (ref 12.0–46.0)
Lymphs Abs: 1.2 10*3/uL (ref 0.7–4.0)
MCHC: 33.2 g/dL (ref 30.0–36.0)
MCV: 99.4 fl (ref 78.0–100.0)
Monocytes Absolute: 0.6 10*3/uL (ref 0.1–1.0)
Monocytes Relative: 11.7 % (ref 3.0–12.0)
Neutro Abs: 3.4 10*3/uL (ref 1.4–7.7)
Neutrophils Relative %: 63.7 % (ref 43.0–77.0)
Platelets: 153 10*3/uL (ref 150.0–400.0)
RBC: 4.62 Mil/uL (ref 4.22–5.81)
RDW: 12.7 % (ref 11.5–14.6)
WBC: 5.3 10*3/uL (ref 4.5–10.5)

## 2012-01-13 LAB — LIPID PANEL
Cholesterol: 97 mg/dL (ref 0–200)
HDL: 28.6 mg/dL — ABNORMAL LOW (ref >39.00)
LDL Cholesterol: 41 mg/dL (ref 0–99)
Total CHOL/HDL Ratio: 3
Triglycerides: 137 mg/dL (ref 0.0–149.0)
VLDL: 27.4 mg/dL (ref 0.0–40.0)

## 2012-01-13 LAB — BASIC METABOLIC PANEL
BUN: 20 mg/dL (ref 6–23)
CO2: 30 mEq/L (ref 19–32)
Calcium: 9.4 mg/dL (ref 8.4–10.5)
Chloride: 103 mEq/L (ref 96–112)
Creatinine, Ser: 1.3 mg/dL (ref 0.4–1.5)
GFR: 60.03 mL/min (ref >60.00)
Glucose, Bld: 83 mg/dL (ref 70–99)
Potassium: 4.2 mEq/L (ref 3.5–5.1)
Sodium: 140 mEq/L (ref 135–145)

## 2012-01-13 NOTE — Patient Instructions (Addendum)
Continue all other medications as before No changes today Please go to LAB in the Basement for the blood and/or urine tests to be done today You will be contacted by phone if any changes need to be made immediately.  Otherwise, you will receive a letter about your results with an explanation. Please remember to sign up for My Chart at your earliest convenience, as this will be important to you in the future with finding out test results. Please keep your appointments with your specialists as you have planned - Dr Pearlean Brownie, and Dr Myrtis Ser Please return in 6 months, or sooner if needed

## 2012-01-18 ENCOUNTER — Encounter: Payer: Self-pay | Admitting: Internal Medicine

## 2012-01-18 NOTE — Assessment & Plan Note (Signed)
Symptomatically stable, for f/u neuro as planned

## 2012-01-18 NOTE — Assessment & Plan Note (Signed)
stable overall by hx and exam, most recent data reviewed with pt, and pt to continue medical treatment as before BP Readings from Last 3 Encounters:  01/13/12 100/62  01/06/12 134/85  12/26/11 114/68

## 2012-01-18 NOTE — Progress Notes (Signed)
Subjective:    Patient ID: Jeffrey Hardy, male    DOB: [birth year], 76 y.o.   MRN: [id number]  HPI  Here to f/u; overall doing ok,  Pt denies chest pain, increased sob or doe, wheezing, orthopnea, PND, increased LE swelling, palpitations, dizziness or syncope.  Pt denies new neurological symptoms such as new headache, or facial or extremity weakness or numbness   Pt denies polydipsia, polyuria, or low sugar symptoms such as weakness or confusion improved with po intake.  Pt states overall good compliance with meds, trying to follow lower cholesterol diet, wt overall stable but little exercise however.  No overt bleeding or bruising.  No recent falls with 4 pronged cane.  Has appt nov 22 with neuro/dr sethi;  Needs appt with dr Myrtis Ser.  Heart monitor ending tomorrow.   Pt denies fever, wt loss, night sweats, loss of appetite, or other constitutional symptoms. Denies worsening depressive symptoms, suicidal ideation, or panic Past Medical History  Diagnosis Date  . Hypertension   . Erectile dysfunction   . Hyperlipidemia   . Anxiety   . Depression   . GERD (gastroesophageal reflux disease)   . Diverticula, colon   . Syncope     History  . Mitral regurgitation   . Peptic ulcer disease     with Duodenal and Antral ulcers  . H. pylori infection   . HYPERLIPIDEMIA 05/12/2008  . ANXIETY 05/12/2008  . DEPRESSION 05/12/2008  . HYPERTENSION 05/12/2008  . GERD 05/12/2008  . PEPTIC ULCER DISEASE 05/12/2008  . DIVERTICULOSIS, COLON 05/12/2008  . OVERACTIVE BLADDER 05/12/2008  . Fracture of knee region 1974    with residual weakness   Past Surgical History  Procedure Date  . Appendectomy   . Tee without cardioversion 12/19/2011    Procedure: TRANSESOPHAGEAL ECHOCARDIOGRAM (TEE);  Surgeon: Pricilla Riffle, MD;  Location: [facility] ENDOSCOPY;  Service: Cardiovascular;  Laterality: N/A;    reports that he has never smoked. He does not have any smokeless tobacco history on file. He reports that he does not drink alcohol or  use illicit drugs. family history includes Dementia in his brother and Parkinsonism in his brother. No Known Allergies Current Outpatient Prescriptions on File Prior to Visit  Medication Sig Dispense Refill  . aspirin 81 MG chewable tablet Chew 1 tablet (81 mg total) by mouth daily.  14 tablet  0  . dipyridamole-aspirin (AGGRENOX) 200-25 MG per 12 hr capsule Take 1 capsule by mouth at bedtime.  14 capsule  0  . dipyridamole-aspirin (AGGRENOX) 200-25 MG per 12 hr capsule Take 1 capsule by mouth 2 (two) times daily.  180 capsule  3  . lisinopril (PRINIVIL,ZESTRIL) 5 MG tablet Take 1 tablet (5 mg total) by mouth daily.  30 tablet  1  . omeprazole (PRILOSEC) 20 MG capsule Take 20 mg by mouth daily as needed.        . simvastatin (ZOCOR) 20 MG tablet Take 1 tablet (20 mg total) by mouth daily at 6 PM.  30 tablet  2  . acetaminophen (TYLENOL) 325 MG tablet Take 2 tablets (650 mg total) by mouth at bedtime.  30 tablet  0   Review of Systems  Constitutional: Negative for diaphoresis and unexpected weight change.  HENT: Negative for tinnitus.   Eyes: Negative for photophobia and visual disturbance.  Respiratory: Negative for choking and stridor.   Gastrointestinal: Negative for vomiting and blood in stool.  Genitourinary: Negative for hematuria and decreased urine volume.  Musculoskeletal: Negative for gait problem.  Skin: Negative for color change and wound.  Neurological: Negative for tremors and numbness.  Psychiatric/Behavioral: Negative for decreased concentration. The patient is not hyperactive.       Objective:   Physical Exam BP 100/62  Pulse 75  Temp 97.6 F (36.4 C) (Oral)  Ht 6\' 1"  (1.854 m)  Wt 207 lb 8 oz (94.121 kg)  BMI 27.38 kg/m2  SpO2 96% Physical Exam  VS noted Constitutional: Pt appears well-developed and well-nourished.  HENT: Head: Normocephalic.  Right Ear: External ear normal.  Left Ear: External ear normal.  Eyes: Conjunctivae and EOM are normal. Pupils are  equal, round, and reactive to light.  Neck: Normal range of motion. Neck supple.  Cardiovascular: Normal rate and regular rhythm.   Pulmonary/Chest: Effort normal and breath sounds normal.  Abd:  Soft, NT, non-distended, + BS Neurological: Pt is alert. Not confused , o/w not done in detail Skin: Skin is warm. No erythema. No bruising Psychiatric: Pt behavior is normal. Thought content normal.     Assessment & Plan:

## 2012-01-18 NOTE — Assessment & Plan Note (Signed)
stable overall by hx and exam, most recent data reviewed with pt, and pt to continue medical treatment as before Lab Results  Component Value Date   LDLCALC 41 01/13/2012

## 2012-01-18 NOTE — Assessment & Plan Note (Signed)
stable overall by hx and exam, most recent data reviewed with pt, and pt to continue medical treatment as before, for f/u lab,  to f/u any worsening symptoms or concerns

## 2012-01-22 DIAGNOSIS — IMO0001 Reserved for inherently not codable concepts without codable children: Secondary | ICD-10-CM

## 2012-01-22 DIAGNOSIS — I69998 Other sequelae following unspecified cerebrovascular disease: Secondary | ICD-10-CM

## 2012-01-22 DIAGNOSIS — R269 Unspecified abnormalities of gait and mobility: Secondary | ICD-10-CM

## 2012-01-22 DIAGNOSIS — I10 Essential (primary) hypertension: Secondary | ICD-10-CM

## 2012-01-23 ENCOUNTER — Encounter: Payer: Self-pay | Admitting: Internal Medicine

## 2012-01-27 ENCOUNTER — Telehealth: Payer: Self-pay | Admitting: Internal Medicine

## 2012-01-27 NOTE — Telephone Encounter (Signed)
Called patient's wife and advised that heart monitor results were sent to Dr.John because he was the ordering physician. She will call him for results.

## 2012-01-27 NOTE — Telephone Encounter (Signed)
New problem:  Test results.  

## 2012-02-28 ENCOUNTER — Ambulatory Visit: Payer: Medicare Other | Admitting: Internal Medicine

## 2012-03-11 ENCOUNTER — Telehealth: Payer: Self-pay | Admitting: Internal Medicine

## 2012-03-11 NOTE — Telephone Encounter (Signed)
To Jeffrey Hardy tomorrow if pt still concerned

## 2012-03-11 NOTE — Telephone Encounter (Signed)
Informed the patient and he will call back tomorrow if still a problem

## 2012-03-11 NOTE — Telephone Encounter (Signed)
Patient Information:  Caller Name: N/A  Phone: [phone]  Patient: Jeffrey Hardy, Jeffrey Hardy  Gender: Male  DOB: October 20, 1935  Age: 76 Years  PCP: Oliver Barre (Adults only)   Symptoms  Reason For Call & Symptoms: Calling because of eye redness  Reviewed Health History In EMR: Yes  Reviewed Medications In EMR: Yes  Reviewed Allergies In EMR: Yes  Reviewed Surgeries / Procedures: Yes  Date of Onset of Symptoms: 02/28/2012  Guideline(s) Used:  Eye - Red Without Pus  Disposition Per Guideline:   See Today in Office  Reason For Disposition Reached:   Bleeding on white of the eye and is taking Coumadin or known bleeding disorder (e.g., thrombocytopenia)  Advice Given:  Red Eye Caused by Mild Irritant  Face Cleansing: Wash the face, then the eyelids, with a mild soap and water. This will remove any irritants. Also try to avoid the irritant.  Eye Irrigation: Irrigate the eye with warm water for 2-3 minutes.  Vasoconstrictor Eyedrops: If your eyes remain uncomfortable after irrigation and are still bloodshot, use some long-acting OTC vasoconstrictor eye drops (e.g., Visine). Use 1 to 2 drops. May repeat once in 8-12 hours.  Red Eye Caused by Pinkeye:  Expected Course: Pinkeye with a cold usually lasts about 7 days.  Call Back If:  Blurred vision or increasing pain  No improvement  You become worse.  Office Follow Up:  Does the office need to follow up with this patient?: Yes  Instructions For The Office: No appointments available.  Small bleed in left eye noted yesterday-is on Aggrenox.  Please follow up if needs to be seen due to disposition.  Otherwise triage has been negative except for bloodshot eyes. Is working outside on farm.  RN Note:  Had eye redness in both eyes, more left eye for past week and half.  Eyes just blood shot, but denies any discomfort except when they are really red--then vision seems a little blurry, but only then.  Has had one eye bleed in lower left eye 03/10/12,  but it has faded some. Patient is on Aggrenox since strokes in September 2013.

## 2012-03-30 ENCOUNTER — Other Ambulatory Visit: Payer: Self-pay

## 2012-03-30 MED ORDER — SIMVASTATIN 20 MG PO TABS: 20.0000 mg | ORAL_TABLET | Freq: Every day | ORAL | Status: DC

## 2012-04-21 ENCOUNTER — Encounter: Payer: Self-pay | Admitting: Internal Medicine

## 2012-04-21 ENCOUNTER — Ambulatory Visit (INDEPENDENT_AMBULATORY_CARE_PROVIDER_SITE_OTHER): Payer: Medicare Other | Admitting: Internal Medicine

## 2012-04-21 VITALS — BP 128/76 | HR 90 | Temp 97.0°F | Ht 73.0 in | Wt 205.0 lb

## 2012-04-21 DIAGNOSIS — G4700 Insomnia, unspecified: Secondary | ICD-10-CM

## 2012-04-21 MED ORDER — TEMAZEPAM 7.5 MG PO CAPS: 7.5000 mg | ORAL_CAPSULE | Freq: Every evening | ORAL | Status: DC | PRN

## 2012-04-21 NOTE — Progress Notes (Signed)
Subjective:    Patient ID: Jeffrey Hardy, male    DOB: 27-Apr-1935, 77 y.o.   MRN: [id number]  HPI  Pt presents to the clinic today with c/o trouble sleeping. This started 3 weeks ago while he was on a medrol dose pack for acute bronchitis. He has since then had difficulty going to sleep and difficulty staying asleep. He has never had problems like this before.  Review of Systems      Past Medical History  Diagnosis Date  . Hypertension   . Erectile dysfunction   . Hyperlipidemia   . Anxiety   . Depression   . GERD (gastroesophageal reflux disease)   . Diverticula, colon   . Syncope     History  . Mitral regurgitation   . Peptic ulcer disease     with Duodenal and Antral ulcers  . H. pylori infection   . HYPERLIPIDEMIA 05/12/2008  . ANXIETY 05/12/2008  . DEPRESSION 05/12/2008  . HYPERTENSION 05/12/2008  . GERD 05/12/2008  . PEPTIC ULCER DISEASE 05/12/2008  . DIVERTICULOSIS, COLON 05/12/2008  . OVERACTIVE BLADDER 05/12/2008  . Fracture of knee region 1974    with residual weakness    Current Outpatient Prescriptions  Medication Sig Dispense Refill  . acetaminophen (TYLENOL) 325 MG tablet Take 2 tablets (650 mg total) by mouth at bedtime.  30 tablet  0  . aspirin 81 MG chewable tablet Chew 1 tablet (81 mg total) by mouth daily.  14 tablet  0  . dipyridamole-aspirin (AGGRENOX) 200-25 MG per 12 hr capsule Take 1 capsule by mouth at bedtime.  14 capsule  0  . dipyridamole-aspirin (AGGRENOX) 200-25 MG per 12 hr capsule Take 1 capsule by mouth 2 (two) times daily.  180 capsule  3  . lisinopril (PRINIVIL,ZESTRIL) 5 MG tablet Take 1 tablet (5 mg total) by mouth daily.  30 tablet  1  . omeprazole (PRILOSEC) 20 MG capsule Take 20 mg by mouth daily as needed.        . simvastatin (ZOCOR) 20 MG tablet Take 1 tablet (20 mg total) by mouth daily at 6 PM.  30 tablet  10    No Known Allergies  Family History  Problem Relation Age of Onset  . Parkinsonism Brother   . Dementia Brother      History   Social History  . Marital Status: Married    Spouse Name: N/A    Number of Children: N/A  . Years of Education: N/A   Occupational History  . Not on file.   Social History Main Topics  . Smoking status: Never Smoker   . Smokeless tobacco: Not on file  . Alcohol Use: No  . Drug Use: No  . Sexually Active:    Other Topics Concern  . Not on file   Social History Narrative   Pt's son is an Teacher, English as a foreign language. Patient is retired from [employer]     Constitutional: Denies fever, malaise, fatigue, headache or abrupt weight changes.  Respiratory: Denies difficulty breathing, shortness of breath, cough or sputum production.   Cardiovascular: Denies chest pain, chest tightness, palpitations or swelling in the hands or feet.  Skin: Denies redness, rashes, lesions or ulcercations.  Neurological: Pt reports insomnia. Denies dizziness, difficulty with memory, difficulty with speech or problems with balance and coordination.   No other specific complaints in a complete review of systems (except as listed in HPI above).  Objective:   Physical Exam   BP 128/76  Pulse 90  Temp 97  F (36.1 C) (Oral)  Ht 6\' 1"  (1.854 m)  Wt 205 lb (92.987 kg)  BMI 27.05 kg/m2  SpO2 97% Wt Readings from Last 3 Encounters:  04/21/12 205 lb (92.987 kg)  01/13/12 207 lb 8 oz (94.121 kg)  01/05/12 210 lb 2 oz (95.312 kg)    General: Appears his stated age, well developed, well nourished in NAD. Cardiovascular: Normal rate and rhythm. S1,S2 noted.  No murmur, rubs or gallops noted. No JVD or BLE edema. No carotid bruits noted. Pulmonary/Chest: Normal effort and positive vesicular breath sounds. No respiratory distress. No wheezes, rales or ronchi noted.  Neurological: Alert and oriented. Cranial nerves II-XII intact. Coordination normal. +DTRs bilaterally.      Assessment & Plan:   Insomnia, new onset with additional workup required:  eRx given for Restoril 7.5 mg Practice good sleep  habits Call me in 3 days if you think we need to increase it to 15 mg  RTC as needed or if symptoms persist

## 2012-04-21 NOTE — Patient Instructions (Signed)

## 2012-04-23 ENCOUNTER — Telehealth: Payer: Self-pay | Admitting: Internal Medicine

## 2012-04-23 NOTE — Telephone Encounter (Signed)
Gavin Pound from [place] is calling to inform the office the patients temazepam was approved for 1 year (04/22/12-04/22/13) they will also fax and letter and notify the patient

## 2012-04-23 NOTE — Telephone Encounter (Signed)
Informed [facility] PA for Temazepam has been approved-they state that they are still waiting for insurance company to update system so that they can run through again.

## 2012-05-28 ENCOUNTER — Telehealth: Payer: Self-pay

## 2012-05-28 MED ORDER — LISINOPRIL 5 MG PO TABS: 5.0000 mg | ORAL_TABLET | Freq: Every day | ORAL | Status: DC

## 2012-05-28 NOTE — Telephone Encounter (Signed)
refill 

## 2012-06-24 ENCOUNTER — Ambulatory Visit (INDEPENDENT_AMBULATORY_CARE_PROVIDER_SITE_OTHER): Payer: Medicare Other | Admitting: Internal Medicine

## 2012-06-24 VITALS — BP 116/68 | HR 59 | Ht 73.0 in | Wt 205.0 lb

## 2012-06-24 DIAGNOSIS — I6350 Cerebral infarction due to unspecified occlusion or stenosis of unspecified cerebral artery: Secondary | ICD-10-CM

## 2012-06-24 DIAGNOSIS — I639 Cerebral infarction, unspecified: Secondary | ICD-10-CM

## 2012-06-24 NOTE — Assessment & Plan Note (Signed)
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 He also has a history of irregular tachypalpitations. It is appropriate to pursue recorder insertion for the diagnosis of atrial fibrillation as would have any impact on anticoagulants and reduce the risk of recurrent stroke. We have discussed the role of an implantable loop recorder, its risks and benefits.

## 2012-06-24 NOTE — Progress Notes (Signed)
ELECTROPHYSIOLOGY CONSULT NOTE  Patient ID: Jeffrey Hardy, MRN: [id number], DOB/AGE: 1935-07-14 77 y.o. Admit date: (Not on file) Date of Consult: 06/24/2012  Primary Physician: Jeffrey Hardy Primary Cardiologist:  new  Chief Complaint:  Repeat stroke    HPI Jeffrey Hardy is a 77 y.o. male seen at the request of Dr. Neurology because of a history of recurrent strokes thought to be consistent with cardioembolic etiology. He has a history of hypertension. He has no known atrial fibrillation. He is hyperlipidemic on therapy.  Transesophageal echo 9/13 demonstrated no LA clot; left ventricular function was normal at 55-60%The patient left the office before the visit was finished. Atrial dimension was normal at 30 mm  Is no significant exercise intolerance. He denies chest pain or shortness of breath or peripheral edema. He's had no syncope   Past Medical History  Diagnosis Date  . Hypertension   . Erectile dysfunction   . Hyperlipidemia   . Anxiety   . Depression   . GERD (gastroesophageal reflux disease)   . Diverticula, colon   . Syncope     History  . Mitral regurgitation   . Peptic ulcer disease     with Duodenal and Antral ulcers  . H. pylori infection   . HYPERLIPIDEMIA 05/12/2008  . ANXIETY 05/12/2008  . DEPRESSION 05/12/2008  . HYPERTENSION 05/12/2008  . GERD 05/12/2008  . PEPTIC ULCER DISEASE 05/12/2008  . DIVERTICULOSIS, COLON 05/12/2008  . OVERACTIVE BLADDER 05/12/2008  . Fracture of knee region 1974    with residual weakness      Surgical History:  Past Surgical History  Procedure Laterality Date  . Appendectomy    . Tee without cardioversion  12/19/2011    Procedure: TRANSESOPHAGEAL ECHOCARDIOGRAM (TEE);  Surgeon: Jeffrey Riffle, [place];  Location[facility] ENDOSCOPY;  Service: Cardiovascular;  Laterality: N/A;     Home Meds: Prior to Admission medications   Medication Sig Start Date End Date Taking? Authorizing Provider  dipyridamole-aspirin (AGGRENOX) 200-25 MG per 12 hr  capsule Take 1 capsule by mouth 2 (two) times daily. 01/06/12  Yes Renae Fickle, [facility]  lisinopril (PRINIVIL,ZESTRIL) 5 MG tablet Take 1 tablet (5 mg total) by mouth daily. 05/28/12  Yes Corwin Levins, [facility]  simvastatin (ZOCOR) 20 MG tablet Take 1 tablet (20 mg total) by mouth daily at 6 PM. 03/30/12 03/30/13 Yes Corwin Levins, [facility]  omeprazole (PRILOSEC) 20 MG capsule Take 20 mg by mouth daily as needed.      Historical Provider, [facility]   *  Allergies: No Known Allergies  History   Social History  . Marital Status: Married    Spouse Name: N/A    Number of Children: N/A  . Years of Education: N/A   Occupational History  . Not on file.   Social History Main Topics  . Smoking status: Never Smoker   . Smokeless tobacco: Not on file  . Alcohol Use: No  . Drug Use: No  . Sexually Active:    Other Topics Concern  . Not on file   Social History Narrative   Pt's son is an Teacher, English as a foreign language. Patient is retired from [employer]     Family History  Problem Relation Age of Onset  . Parkinsonism Brother   . Dementia Brother      ROS:  Please see the history of present illness.   All other systems reviewed and negative.    Physical Exam   Blood pressure 116/68, pulse 59, height 6\' 1"  (1.854  m), weight 205 lb (92.987 kg). General: Well developed, well nourished male in no acute distress. Head: Normocephalic, atraumatic, sclera non-icteric, no xanthomas, nares are without discharge. EENT: normal Lymph Nodes:  none Back: without scoliosis/kyphosis , no CVA tendersness Neck: Negative for carotid bruits. JVD not elevated. Lungs: Clear bilaterally to auscultation without wheezes, rales, or rhonchi. Breathing is unlabored. Heart: RRR with S1 S2. No   murmur , rubs, or gallops appreciated. Abdomen: Soft, non-tender, non-distended with normoactive bowel sounds. No hepatomegaly. No rebound/guarding. No obvious abdominal masses. Msk:  Strength and tone appear normal for age. Extremities: No clubbing or  cyanosis. No edema.  Distal pedal pulses are 2+ and equal bilaterally. Skin: Warm and Dry Neuro: Alert and oriented X 3. CN III-XII intact Grossly normal sensory and motor function . Psych:  Responds to questions appropriately with a normal affect.      Labs: Cardiac Enzymes No results found for this basename: CKTOTAL, CKMB, TROPONINI,  in the last 72 hours CBC Lab Results  Component Value Date   WBC 5.3 01/13/2012   HGB 15.2 01/13/2012   HCT 45.9 01/13/2012   MCV 99.4 01/13/2012   PLT 153.0 01/13/2012   PROTIME: No results found for this basename: LABPROT, INR,  in the last 72 hours Chemistry No results found for this basename: NA, K, CL, CO2, BUN, CREATININE, CALCIUM, LABALBU, PROT, BILITOT, ALKPHOS, ALT, AST, GLUCOSE,  in the last 168 hours Lipids Lab Results  Component Value Date   CHOL 97 01/13/2012   HDL 28.60* 01/13/2012   LDLCALC 41 01/13/2012   TRIG 137.0 01/13/2012   BNP No results found for this basename: probnp   Miscellaneous No results found for this basename: DDIMER    Radiology/Studies:  No results found.  EKG: ECG: Sinus Rhythm  @59             Intervals  15/11/41  Axis 65     Assessment and Plan:    [name]

## 2012-06-24 NOTE — Patient Instructions (Addendum)
OFFICE WILL CALL REGARDING THE INSERTION OF LOOP RECORDER=POTENTIAL DATE 07-06-12

## 2012-06-30 ENCOUNTER — Telehealth: Payer: Self-pay | Admitting: Internal Medicine

## 2012-06-30 NOTE — Telephone Encounter (Signed)
New problem    Pt is ready to setup procedure for loop recorder

## 2012-07-01 NOTE — Telephone Encounter (Signed)
I spoke with pt and he would like Heather to call you back tomorrow about scheduling loop recorder for 07/06/12 as he talked with Dr. Graciela Husbands about this in the office last week.  Mylo Red RN

## 2012-07-01 NOTE — Telephone Encounter (Signed)
New Problem   Pt is calling to setup procedure for loop recorder. Trying to follow up. Would like to speak to nurse.

## 2012-07-02 ENCOUNTER — Encounter: Payer: Self-pay | Admitting: *Deleted

## 2012-07-02 ENCOUNTER — Encounter (HOSPITAL_COMMUNITY): Payer: Self-pay | Admitting: Pharmacy Technician

## 2012-07-02 NOTE — Telephone Encounter (Signed)
I spoke with the patient. He will have his loop recorder implanted on 3/31 at 2:30 pm. Instructions given to patient via phone.

## 2012-07-06 ENCOUNTER — Ambulatory Visit (HOSPITAL_COMMUNITY)
Admission: RE | Admit: 2012-07-06 | Discharge: 2012-07-06 | Disposition: A | Discharge status: Home / Self Care | Payer: Medicare Other | Source: Ambulatory Visit | Attending: Internal Medicine | Admitting: Internal Medicine

## 2012-07-06 ENCOUNTER — Encounter (HOSPITAL_COMMUNITY)
Admission: RE | Disposition: A | Discharge status: Home / Self Care | Payer: Self-pay | Source: Ambulatory Visit | Attending: Internal Medicine

## 2012-07-06 DIAGNOSIS — E785 Hyperlipidemia, unspecified: Secondary | ICD-10-CM | POA: Insufficient documentation

## 2012-07-06 DIAGNOSIS — I10 Essential (primary) hypertension: Secondary | ICD-10-CM | POA: Insufficient documentation

## 2012-07-06 DIAGNOSIS — I6350 Cerebral infarction due to unspecified occlusion or stenosis of unspecified cerebral artery: Secondary | ICD-10-CM

## 2012-07-06 DIAGNOSIS — R55 Syncope and collapse: Secondary | ICD-10-CM | POA: Insufficient documentation

## 2012-07-06 DIAGNOSIS — Z8673 Personal history of transient ischemic attack (TIA), and cerebral infarction without residual deficits: Secondary | ICD-10-CM | POA: Insufficient documentation

## 2012-07-06 HISTORY — PX: LOOP RECORDER IMPLANT: SHX5477

## 2012-07-06 LAB — SURGICAL PCR SCREEN
MRSA, PCR: NEGATIVE
Staphylococcus aureus: NEGATIVE

## 2012-07-06 SURGERY — LOOP RECORDER IMPLANT
Anesthesia: LOCAL

## 2012-07-06 MED ORDER — MUPIROCIN 2 % EX OINT
TOPICAL_OINTMENT | Freq: Two times a day (BID) | CUTANEOUS | Status: DC
  Administered 2012-07-06: 13:00:00 via NASAL
  Filled 2012-07-06: qty 22

## 2012-07-06 MED ORDER — LIDOCAINE HCL (PF) 1 % IJ SOLN
INTRAMUSCULAR | Status: AC
  Filled 2012-07-06: qty 30

## 2012-07-06 NOTE — H&P (View-Only) (Signed)
 ELECTROPHYSIOLOGY CONSULT NOTE  Patient ID: Jeffrey Hardy, MRN: [id number], DOB/AGE: 11/24/1935 76 y.o. Admit date: (Not on file) Date of Consult: 06/24/2012  Primary Physician: Jeffrey John, MD Primary Cardiologist:  new  Chief Complaint:  Repeat stroke    HPI Jeffrey Hardy is a 76 y.o. male seen at the request of Dr. Neurology because of a history of recurrent strokes thought to be consistent with cardioembolic etiology. He has a history of hypertension. He has no known atrial fibrillation. He is hyperlipidemic on therapy.  Transesophageal echo 9/13 demonstrated no LA clot; left ventricular function was normal at 55-60%The patient left the office before the visit was finished. Atrial dimension was normal at 30 mm  Is no significant exercise intolerance. He denies chest pain or shortness of breath or peripheral edema. He's had no syncope   Past Medical History  Diagnosis Date  . Hypertension   . Erectile dysfunction   . Hyperlipidemia   . Anxiety   . Depression   . GERD (gastroesophageal reflux disease)   . Diverticula, colon   . Syncope     History  . Mitral regurgitation   . Peptic ulcer disease     with Duodenal and Antral ulcers  . H. pylori infection   . HYPERLIPIDEMIA 05/12/2008  . ANXIETY 05/12/2008  . DEPRESSION 05/12/2008  . HYPERTENSION 05/12/2008  . GERD 05/12/2008  . PEPTIC ULCER DISEASE 05/12/2008  . DIVERTICULOSIS, COLON 05/12/2008  . OVERACTIVE BLADDER 05/12/2008  . Fracture of knee region 1974    with residual weakness      Surgical History:  Past Surgical History  Procedure Laterality Date  . Appendectomy    . Tee without cardioversion  12/19/2011    Procedure: TRANSESOPHAGEAL ECHOCARDIOGRAM (TEE);  Surgeon: Jeffrey V Ross, MD;  Location: [facility] ENDOSCOPY;  Service: Cardiovascular;  Laterality: N/A;     Home Meds: Prior to Admission medications   Medication Sig Start Date End Date Taking? Authorizing Provider  dipyridamole-aspirin (AGGRENOX) 200-25 MG per 12 hr  capsule Take 1 capsule by mouth 2 (two) times daily. 01/06/12  Yes Jeffrey Short, MD  lisinopril (PRINIVIL,ZESTRIL) 5 MG tablet Take 1 tablet (5 mg total) by mouth daily. 05/28/12  Yes Jeffrey W John, MD  simvastatin (ZOCOR) 20 MG tablet Take 1 tablet (20 mg total) by mouth daily at 6 PM. 03/30/12 03/30/13 Yes Jeffrey W John, MD  omeprazole (PRILOSEC) 20 MG capsule Take 20 mg by mouth daily as needed.      Historical Provider, MD   *  Allergies: No Known Allergies  History   Social History  . Marital Status: Married    Spouse Name: N/A    Number of Children: N/A  . Years of Education: N/A   Occupational History  . Not on file.   Social History Main Topics  . Smoking status: Never Smoker   . Smokeless tobacco: Not on file  . Alcohol Use: No  . Drug Use: No  . Sexually Active:    Other Topics Concern  . Not on file   Social History Narrative   Pt's son is an archeologist. Patient is retired from [employer]     Family History  Problem Relation Age of Onset  . Parkinsonism Brother   . Dementia Brother      ROS:  Please see the history of present illness.   All other systems reviewed and negative.    Physical Exam   Blood pressure 116/68, pulse 59, height 6' 1" (1.854   m), weight 205 lb (92.987 kg). General: Well developed, well nourished male in no acute distress. Head: Normocephalic, atraumatic, sclera non-icteric, no xanthomas, nares are without discharge. EENT: normal Lymph Nodes:  none Back: without scoliosis/kyphosis , no CVA tendersness Neck: Negative for carotid bruits. JVD not elevated. Lungs: Clear bilaterally to auscultation without wheezes, rales, or rhonchi. Breathing is unlabored. Heart: RRR with S1 S2. No   murmur , rubs, or gallops appreciated. Abdomen: Soft, non-tender, non-distended with normoactive bowel sounds. No hepatomegaly. No rebound/guarding. No obvious abdominal masses. Msk:  Strength and tone appear normal for age. Extremities: No clubbing or  cyanosis. No edema.  Distal pedal pulses are 2+ and equal bilaterally. Skin: Warm and Dry Neuro: Alert and oriented X 3. CN III-XII intact Grossly normal sensory and motor function . Psych:  Responds to questions appropriately with a normal affect.      Labs: Cardiac Enzymes No results found for this basename: CKTOTAL, CKMB, TROPONINI,  in the last 72 hours CBC Lab Results  Component Value Date   WBC 5.3 01/13/2012   HGB 15.2 01/13/2012   HCT 45.9 01/13/2012   MCV 99.4 01/13/2012   PLT 153.0 01/13/2012   PROTIME: No results found for this basename: LABPROT, INR,  in the last 72 hours Chemistry No results found for this basename: NA, K, CL, CO2, BUN, CREATININE, CALCIUM, LABALBU, PROT, BILITOT, ALKPHOS, ALT, AST, GLUCOSE,  in the last 168 hours Lipids Lab Results  Component Value Date   CHOL 97 01/13/2012   HDL 28.60* 01/13/2012   LDLCALC 41 01/13/2012   TRIG 137.0 01/13/2012   BNP No results found for this basename: probnp   Miscellaneous No results found for this basename: DDIMER    Radiology/Studies:  No results found.  EKG: ECG: Sinus Rhythm  @59            Intervals  15/11/41  Axis 65     Assessment and Plan:    Jeffrey Hardy   

## 2012-07-06 NOTE — CV Procedure (Signed)
Pre op Dx Cryptogenic stroke  Post op Dx    Procedure  Loop Recorder implantation  After routine prep and drape of the left parasternal area, a small incision was created. A Medtronic LINQ Reveal Loop Recorder  Serial Number  [id number] was inserted.    Steri-Strips were applied.  The patient tolerated the procedure without apparent complication.

## 2012-07-06 NOTE — Interval H&P Note (Signed)
History and Physical Interval Note:  07/06/2012 1:48 PM  Jeffrey Hardy  has presented today for surgery, with the diagnosis of Syncope  The various methods of treatment have been discussed with the patient and family. After consideration of risks, benefits and other options for treatment, the patient has consented to  Procedure(s): LOOP RECORDER IMPLANT (N/A) as a surgical intervention .  The patient's history has been reviewed, patient examined, no change in status, stable for surgery.  I have reviewed the patient's chart and labs.  Questions were answered to the patient's satisfaction.     [name]

## 2012-07-06 NOTE — CV Procedure (Signed)
d 

## 2012-07-14 ENCOUNTER — Ambulatory Visit (INDEPENDENT_AMBULATORY_CARE_PROVIDER_SITE_OTHER): Payer: Medicare Other | Admitting: Internal Medicine

## 2012-07-14 ENCOUNTER — Encounter: Payer: Self-pay | Admitting: Internal Medicine

## 2012-07-14 VITALS — BP 120/62 | HR 55 | Temp 97.8°F | Ht 72.0 in | Wt 204.1 lb

## 2012-07-14 DIAGNOSIS — I10 Essential (primary) hypertension: Secondary | ICD-10-CM

## 2012-07-14 DIAGNOSIS — K219 Gastro-esophageal reflux disease without esophagitis: Secondary | ICD-10-CM

## 2012-07-14 DIAGNOSIS — R55 Syncope and collapse: Secondary | ICD-10-CM | POA: Insufficient documentation

## 2012-07-14 DIAGNOSIS — E785 Hyperlipidemia, unspecified: Secondary | ICD-10-CM

## 2012-07-14 DIAGNOSIS — I6350 Cerebral infarction due to unspecified occlusion or stenosis of unspecified cerebral artery: Secondary | ICD-10-CM

## 2012-07-14 DIAGNOSIS — I639 Cerebral infarction, unspecified: Secondary | ICD-10-CM

## 2012-07-14 NOTE — Patient Instructions (Addendum)
Please continue all other medications as before, and refills have been done if requested. Please have the pharmacy call with any other refills you may need. Please continue your efforts at being more active, low cholesterol diet, and weight control. Thank you for enrolling in MyChart. Please follow the instructions below to securely access your online medical record. MyChart allows you to send messages to your doctor, view your test results, renew your prescriptions, schedule appointments, and more. To Log into My Chart online, please go by [employer] or [employer] to [email or web address], or download the MyChart App from the [employer] of [employer] .  Your Username is: [email or web address] (password [email or web address]) Please send a practice Message on Mychart later today. Please return in Sept 2014, or sooner if needed, with Lab testing done 3-5 days before

## 2012-07-14 NOTE — Assessment & Plan Note (Signed)
stable overall by history and exam, recent data reviewed with pt, and pt to continue medical treatment as before,  to f/u any worsening symptoms or concerns Lab Results  Component Value Date   WBC 5.3 01/13/2012   HGB 15.2 01/13/2012   HCT 45.9 01/13/2012   PLT 153.0 01/13/2012   GLUCOSE 83 01/13/2012   CHOL 97 01/13/2012   TRIG 137.0 01/13/2012   HDL 28.60* 01/13/2012   LDLCALC 41 01/13/2012   ALT 21 01/04/2012   AST 19 01/04/2012   NA 140 01/13/2012   K 4.2 01/13/2012   CL 103 01/13/2012   CREATININE 1.3 01/13/2012   BUN 20 01/13/2012   CO2 30 01/13/2012   TSH 1.08 12/26/2011   PSA 0.56 12/26/2011   INR 1.02 01/04/2012   HGBA1C 5.4 12/17/2011

## 2012-07-14 NOTE — Assessment & Plan Note (Signed)
stable overall by history and exam, recent data reviewed with pt, and pt to continue medical treatment as before,  to f/u any worsening symptoms or concerns, for change to eliquis soon likley

## 2012-07-14 NOTE — Assessment & Plan Note (Signed)
stable overall by history and exam, recent data reviewed with pt, and pt to continue medical treatment as before,  to f/u any worsening symptoms or concerns BP Readings from Last 3 Encounters:  07/14/12 120/62  07/06/12 130/75  07/06/12 130/75

## 2012-07-14 NOTE — Assessment & Plan Note (Signed)
stable overall by history and exam, recent data reviewed with pt, and pt to continue medical treatment as before,  to f/u any worsening symptoms or concerns Lab Results  Component Value Date   LDLCALC 41 01/13/2012

## 2012-07-14 NOTE — Progress Notes (Signed)
Subjective:    Patient ID: Jeffrey Hardy, male    DOB: 1935-06-16, 77 y.o.   MRN: [id number]   HPI  Here with wife, overall doing, recently with recorder implanted. Pt denies chest pain, increased sob or doe, wheezing, orthopnea, PND, increased LE swelling, palpitations, dizziness or syncope.  Pt denies new neurological symptoms such as new headache, or facial or extremity weakness or numbness  Pt denies polydipsia, polyuria.  On aggrenox for now, to start eliquis soon likely per cardiology.  No over bleeding or bruising. Trying to follow low cholest diet, Denies worsening reflux, abd pain, dysphagia, n/v, bowel change or blood. Past Medical History  Diagnosis Date  . Hypertension   . Erectile dysfunction   . Hyperlipidemia   . Anxiety   . Depression   . GERD (gastroesophageal reflux disease)   . Diverticula, colon   . Syncope     History  . Mitral regurgitation   . Peptic ulcer disease     with Duodenal and Antral ulcers  . H. pylori infection   . HYPERLIPIDEMIA 05/12/2008  . ANXIETY 05/12/2008  . DEPRESSION 05/12/2008  . HYPERTENSION 05/12/2008  . GERD 05/12/2008  . PEPTIC ULCER DISEASE 05/12/2008  . DIVERTICULOSIS, COLON 05/12/2008  . OVERACTIVE BLADDER 05/12/2008  . Fracture of knee region 1974    with residual weakness   Past Surgical History  Procedure Laterality Date  . Appendectomy    . Tee without cardioversion  12/19/2011    Procedure: TRANSESOPHAGEAL ECHOCARDIOGRAM (TEE);  Surgeon: Pricilla Riffle, MD;  Location: [facility] ENDOSCOPY;  Service: Cardiovascular;  Laterality: N/A;    reports that he has never smoked. He does not have any smokeless tobacco history on file. He reports that he does not drink alcohol or use illicit drugs. family history includes Dementia in his brother and Parkinsonism in his brother. No Known Allergies Current Outpatient Prescriptions on File Prior to Visit  Medication Sig Dispense Refill  . dipyridamole-aspirin (AGGRENOX) 200-25 MG per 12 hr capsule Take 1  capsule by mouth 2 (two) times daily.  180 capsule  3  . lisinopril (PRINIVIL,ZESTRIL) 5 MG tablet Take 1 tablet (5 mg total) by mouth daily.  30 tablet  10  . simvastatin (ZOCOR) 20 MG tablet Take 1 tablet (20 mg total) by mouth daily at 6 PM.  30 tablet  10   No current facility-administered medications on file prior to visit.   Review of Systems  Constitutional: Negative for unexpected weight change, or unusual diaphoresis  HENT: Negative for tinnitus.   Eyes: Negative for photophobia and visual disturbance.  Respiratory: Negative for choking and stridor.   Gastrointestinal: Negative for vomiting and blood in stool.  Genitourinary: Negative for hematuria and decreased urine volume.  Musculoskeletal: Negative for acute joint swelling Skin: Negative for color change and wound.  Neurological: Negative for tremors and numbness other than noted  Psychiatric/Behavioral: Negative for decreased concentration or  hyperactivity.        Objective:   Physical Exam BP 120/62  Pulse 55  Temp(Src) 97.8 F (36.6 C) (Oral)  Ht 6' (1.829 m)  Wt 204 lb 2 oz (92.59 kg)  BMI 27.68 kg/m2  SpO2 95% VS noted,  Constitutional: Pt appears well-developed and well-nourished.  HENT: Head: NCAT.  Right Ear: External ear normal.  Left Ear: External ear normal.  Eyes: Conjunctivae and EOM are normal. Pupils are equal, round, and reactive to light.  Neck: Normal range of motion. Neck supple.  Cardiovascular: Normal rate and regular  rhythm.   Pulmonary/Chest: Effort normal and breath sounds normal.  Abd:  Soft, NT, non-distended, + BS Neurological: Pt is alert. Skin: Skin is warm. No erythema.  Psychiatric: Pt behavior is normal.     Assessment & Plan:

## 2012-07-16 ENCOUNTER — Telehealth: Payer: Self-pay | Admitting: *Deleted

## 2012-07-16 ENCOUNTER — Ambulatory Visit (INDEPENDENT_AMBULATORY_CARE_PROVIDER_SITE_OTHER): Payer: Medicare Other | Admitting: *Deleted

## 2012-07-16 ENCOUNTER — Other Ambulatory Visit: Payer: Self-pay

## 2012-07-16 DIAGNOSIS — R55 Syncope and collapse: Secondary | ICD-10-CM

## 2012-07-16 LAB — PACEMAKER DEVICE OBSERVATION

## 2012-07-16 NOTE — Progress Notes (Signed)
Wound check-ILR 

## 2012-07-16 NOTE — Telephone Encounter (Signed)
Pt in for wound check for loop recorder. Pt inquired about Agreenox being changed to Eliquis as previous discussion. Pt had loop implanted on 07-06-12.

## 2012-07-21 NOTE — Telephone Encounter (Signed)
1 if the loop recorder demonstrates atrial fibrillation changing anticoagulation would be appropriate. We'll follow her along  thx

## 2012-07-22 NOTE — Telephone Encounter (Signed)
Spoke with pt, aware of dr [employer] recommendations.

## 2012-07-31 ENCOUNTER — Telehealth: Payer: Self-pay | Admitting: Internal Medicine

## 2012-07-31 NOTE — Telephone Encounter (Signed)
Spoke with pt wife, aware not going to start eliquis until atrial fib seen on monitor. Questions regarding loop monitor answered

## 2012-07-31 NOTE — Telephone Encounter (Signed)
New problem     Pt didn't received his new prescription for Eliquis that Dr Graciela Husbands was suppose to give him. Please call pt.

## 2012-08-07 ENCOUNTER — Encounter: Payer: Self-pay | Admitting: Internal Medicine

## 2012-10-12 ENCOUNTER — Encounter (HOSPITAL_COMMUNITY): Payer: Self-pay | Admitting: *Deleted

## 2012-10-12 ENCOUNTER — Emergency Department (HOSPITAL_COMMUNITY): Payer: Medicare Other

## 2012-10-12 ENCOUNTER — Emergency Department (HOSPITAL_COMMUNITY)
Admission: EM | Admit: 2012-10-12 | Discharge: 2012-10-12 | Disposition: A | Discharge status: Home / Self Care | Payer: Medicare Other | Attending: Emergency Medicine | Admitting: Emergency Medicine

## 2012-10-12 DIAGNOSIS — S40019A Contusion of unspecified shoulder, initial encounter: Secondary | ICD-10-CM | POA: Insufficient documentation

## 2012-10-12 DIAGNOSIS — S40011A Contusion of right shoulder, initial encounter: Secondary | ICD-10-CM

## 2012-10-12 DIAGNOSIS — Z8719 Personal history of other diseases of the digestive system: Secondary | ICD-10-CM | POA: Insufficient documentation

## 2012-10-12 DIAGNOSIS — IMO0002 Reserved for concepts with insufficient information to code with codable children: Secondary | ICD-10-CM | POA: Insufficient documentation

## 2012-10-12 DIAGNOSIS — Y998 Other external cause status: Secondary | ICD-10-CM | POA: Insufficient documentation

## 2012-10-12 DIAGNOSIS — N529 Male erectile dysfunction, unspecified: Secondary | ICD-10-CM | POA: Insufficient documentation

## 2012-10-12 DIAGNOSIS — K219 Gastro-esophageal reflux disease without esophagitis: Secondary | ICD-10-CM | POA: Insufficient documentation

## 2012-10-12 DIAGNOSIS — Y929 Unspecified place or not applicable: Secondary | ICD-10-CM | POA: Insufficient documentation

## 2012-10-12 DIAGNOSIS — F341 Dysthymic disorder: Secondary | ICD-10-CM | POA: Insufficient documentation

## 2012-10-12 DIAGNOSIS — S52599A Other fractures of lower end of unspecified radius, initial encounter for closed fracture: Secondary | ICD-10-CM | POA: Insufficient documentation

## 2012-10-12 DIAGNOSIS — W108XXA Fall (on) (from) other stairs and steps, initial encounter: Secondary | ICD-10-CM | POA: Insufficient documentation

## 2012-10-12 DIAGNOSIS — I059 Rheumatic mitral valve disease, unspecified: Secondary | ICD-10-CM | POA: Insufficient documentation

## 2012-10-12 DIAGNOSIS — S62101A Fracture of unspecified carpal bone, right wrist, initial encounter for closed fracture: Secondary | ICD-10-CM

## 2012-10-12 DIAGNOSIS — E785 Hyperlipidemia, unspecified: Secondary | ICD-10-CM | POA: Insufficient documentation

## 2012-10-12 DIAGNOSIS — I10 Essential (primary) hypertension: Secondary | ICD-10-CM | POA: Insufficient documentation

## 2012-10-12 MED ORDER — METHOCARBAMOL 500 MG PO TABS: 500.0000 mg | ORAL_TABLET | Freq: Two times a day (BID) | ORAL | Status: DC

## 2012-10-12 MED ORDER — ONDANSETRON HCL 4 MG/2ML IJ SOLN
4.0000 mg | Freq: Once | INTRAMUSCULAR | Status: AC
  Administered 2012-10-12: 4 mg via INTRAVENOUS
  Filled 2012-10-12: qty 2

## 2012-10-12 MED ORDER — OXYCODONE-ACETAMINOPHEN 5-325 MG PO TABS: 2.0000 | ORAL_TABLET | ORAL | Status: DC | PRN

## 2012-10-12 MED ORDER — SIMVASTATIN 20 MG PO TABS: 20.0000 mg | ORAL_TABLET | Freq: Every day | ORAL | Status: DC

## 2012-10-12 MED ORDER — HYDROMORPHONE HCL PF 1 MG/ML IJ SOLN
1.0000 mg | Freq: Once | INTRAMUSCULAR | Status: AC
  Administered 2012-10-12: 1 mg via INTRAVENOUS
  Filled 2012-10-12: qty 1

## 2012-10-12 NOTE — ED Notes (Signed)
MD Freida Busman at bedside to update patient and family

## 2012-10-12 NOTE — ED Provider Notes (Signed)
History    CSN: [id number] Arrival date & time 10/12/12  4098  First MD Initiated Contact with Patient 10/12/12 [phone]     Chief Complaint  Patient presents with  . Fall  . Shoulder Injury  . Abrasion   (Consider location/radiation/quality/duration/timing/severity/associated sxs/prior Treatment) Patient is a 77 y.o. male presenting with fall and shoulder injury. The history is provided by the patient.  Fall  Shoulder Injury   patient here after sustaining a fall down 4-5 steps just prior to arrival. Complains of pain to his right shoulder bilateral wrist and left knee. No loss of consciousness was noted. Patient denies any abdominal or chest pain. Pain characterized as sharp and worse with movement. Denies any neck pain. Does have abrasions to his left hand as well as well as bilateral knees. EMS was called and patient placed in c-collar and transported. Patient denies any symptoms prior to the fall Past Medical History  Diagnosis Date  . Hypertension   . Erectile dysfunction   . Hyperlipidemia   . Anxiety   . Depression   . GERD (gastroesophageal reflux disease)   . Diverticula, colon   . Syncope     History  . Mitral regurgitation   . Peptic ulcer disease     with Duodenal and Antral ulcers  . H. pylori infection   . HYPERLIPIDEMIA 05/12/2008  . ANXIETY 05/12/2008  . DEPRESSION 05/12/2008  . HYPERTENSION 05/12/2008  . GERD 05/12/2008  . PEPTIC ULCER DISEASE 05/12/2008  . DIVERTICULOSIS, COLON 05/12/2008  . OVERACTIVE BLADDER 05/12/2008  . Fracture of knee region 1974    with residual weakness   Past Surgical History  Procedure Laterality Date  . Appendectomy    . Tee without cardioversion  12/19/2011    Procedure: TRANSESOPHAGEAL ECHOCARDIOGRAM (TEE);  Surgeon: Pricilla Riffle, MD;  Location: [facility] ENDOSCOPY;  Service: Cardiovascular;  Laterality: N/A;   Family History  Problem Relation Age of Onset  . Parkinsonism Brother   . Dementia Brother    History  Substance Use Topics  .  Smoking status: Never Smoker   . Smokeless tobacco: Not on file  . Alcohol Use: No    Review of Systems  All other systems reviewed and are negative.    Allergies  Review of patient's allergies indicates no known allergies.  Home Medications   Current Outpatient Rx  Name  Route  Sig  Dispense  Refill  . dipyridamole-aspirin (AGGRENOX) 200-25 MG per 12 hr capsule   Oral   Take 1 capsule by mouth 2 (two) times daily.   180 capsule   3   . lisinopril (PRINIVIL,ZESTRIL) 5 MG tablet   Oral   Take 1 tablet (5 mg total) by mouth daily.   30 tablet   10   . simvastatin (ZOCOR) 20 MG tablet   Oral   Take 1 tablet (20 mg total) by mouth daily at 6 PM.   30 tablet   10    BP 128/61  Temp(Src) 98.2 F (36.8 C) (Oral)  Resp 18  Wt 205 lb (92.987 kg)  BMI 27.8 kg/m2  SpO2 100% Physical Exam  Nursing note and vitals reviewed. Constitutional: He is oriented to person, place, and time. He appears well-developed and well-nourished.  Non-toxic appearance. No distress.  HENT:  Head: Normocephalic and atraumatic.  Eyes: Conjunctivae, EOM and lids are normal. Pupils are equal, round, and reactive to light.  Neck: Normal range of motion. Neck supple. No tracheal deviation present. No mass present.  Cardiovascular: Normal rate, regular rhythm and normal heart sounds.  Exam reveals no gallop.   No murmur heard. Pulmonary/Chest: Effort normal and breath sounds normal. No stridor. No respiratory distress. He has no decreased breath sounds. He has no wheezes. He has no rhonchi. He has no rales.  Abdominal: Soft. Normal appearance and bowel sounds are normal. He exhibits no distension. There is no tenderness. There is no rebound and no CVA tenderness.  Musculoskeletal: Normal range of motion. He exhibits no edema and no tenderness.       Arms:      Legs: Neurological: He is alert and oriented to person, place, and time. He has normal strength. No cranial nerve deficit or sensory deficit.  GCS eye subscore is 4. GCS verbal subscore is 5. GCS motor subscore is 6.  Skin: Skin is warm and dry. No abrasion and no rash noted.  Psychiatric: He has a normal mood and affect. His speech is normal and behavior is normal.    ED Course  Procedures (including critical care time) Labs Reviewed - No data to display No results found. No diagnosis found.  MDM  Patient's wounds cleaned and dressed by nursing staff. Sling placed for comfort. Splint placed on patient's right forearm by nursing staff. Patient given referral to hand surgeon on call as well as sling for comfort and is stable for discharge. Given morphine for pain here and does much better  Toy Baker, MD 10/12/12 1204

## 2012-10-12 NOTE — Progress Notes (Addendum)
Orthopedic Tech Progress Note Patient Details:  Jeffrey Hardy [facility] April 02, 1936 [id number] Applied fiberglass volar splint and arm sling to RUE.  Pulses, capillary refill and neuro intact before and after application. Ortho Devices Type of Ortho Device: Volar splint Ortho Device/Splint Location: RUE Ortho Device/Splint Interventions: Application   [name] 10/12/2012, 12:27 PM

## 2012-10-12 NOTE — ED Notes (Signed)
Tramaine, EMT performing wound care on pt

## 2012-10-12 NOTE — ED Notes (Signed)
Patient states he was walking down steps this am and tripped and fell on right arm and shoulder.  Patient with limited movement of right arm, +PMS in affected extremity, patient denies dizziness prior to fall or loc post fall.  Patient also with abrasions to bilateral knees and to left hand.

## 2012-10-12 NOTE — ED Notes (Signed)
Ortho tech notified of need for splint and immobilizer.

## 2012-10-13 ENCOUNTER — Telehealth: Payer: Self-pay | Admitting: Internal Medicine

## 2012-10-13 ENCOUNTER — Encounter: Payer: Self-pay | Admitting: *Deleted

## 2012-10-13 ENCOUNTER — Telehealth: Payer: Self-pay | Admitting: Neurology

## 2012-10-13 NOTE — Telephone Encounter (Signed)
Needs letter for clearance for hand surgery.

## 2012-10-13 NOTE — Telephone Encounter (Signed)
Walk In pt Form " Clearance For Surgery" sent to Message Nurse 10/13/12/KM

## 2012-10-15 ENCOUNTER — Telehealth: Payer: Self-pay | Admitting: *Deleted

## 2012-10-15 ENCOUNTER — Encounter: Payer: Self-pay | Admitting: *Deleted

## 2012-10-15 NOTE — Telephone Encounter (Signed)
Letter faxed to [phone] Dr. Mina Marble. (successful transmission).

## 2012-10-15 NOTE — Telephone Encounter (Signed)
Spoke with [employer]. Dr. Mina Marble to do surgery for wrist fracture under general anesthesia. Reviewed with Dr. Graciela Husbands. Ok for surgery if no change in functional status. Letter faxed to Dr. Mina Marble.

## 2012-10-15 NOTE — Telephone Encounter (Signed)
Walk in form received from the patient's wife stating he needs cardiac and neuro clearance for surgery on 7/14. I have left a message for Larita Fife at Dr. Stark Jock office to call and let me know what he is having done and under what type of anesthesia.

## 2012-12-10 ENCOUNTER — Encounter: Payer: Self-pay | Admitting: Neurology

## 2012-12-14 ENCOUNTER — Ambulatory Visit (INDEPENDENT_AMBULATORY_CARE_PROVIDER_SITE_OTHER): Payer: Medicare Other | Admitting: Neurology

## 2012-12-14 ENCOUNTER — Encounter: Payer: Self-pay | Admitting: Neurology

## 2012-12-14 VITALS — BP 120/68 | HR 64 | Temp 97.7°F | Ht 72.5 in | Wt 202.0 lb

## 2012-12-14 DIAGNOSIS — I6350 Cerebral infarction due to unspecified occlusion or stenosis of unspecified cerebral artery: Secondary | ICD-10-CM

## 2012-12-14 DIAGNOSIS — S52531A Colles' fracture of right radius, initial encounter for closed fracture: Secondary | ICD-10-CM | POA: Insufficient documentation

## 2012-12-14 DIAGNOSIS — S5290XD Unspecified fracture of unspecified forearm, subsequent encounter for closed fracture with routine healing: Secondary | ICD-10-CM

## 2012-12-14 DIAGNOSIS — S52531D Colles' fracture of right radius, subsequent encounter for closed fracture with routine healing: Secondary | ICD-10-CM

## 2012-12-14 MED ORDER — ASPIRIN-DIPYRIDAMOLE ER 25-200 MG PO CP12: 1.0000 | ORAL_CAPSULE | Freq: Two times a day (BID) | ORAL | Status: DC

## 2012-12-14 NOTE — Patient Instructions (Addendum)
Continue Aggrenox for secondary stroke prevention. Patient was given  refills. Continue Lipitor with an LDL cholesterol goal below 100 mg percent and strict control of hypertension with blood pressure goal below 130/90. Check followup carotid ultrasound study. Return for followup in 6 months with Levester Fresh, NP or call earlier if necessary.

## 2012-12-14 NOTE — Progress Notes (Signed)
[facility] [address] [place]. [place] [address] [phone]       OFFICE FOLLOW-UP NOTE  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 Vascular risk factors of hypertension and hyperlipidemia.  Update 12/14/2012 He returns for followup today after last visit on 06/10/12. He continues to do well from a neurovascular standpoint and has not had any stroke or TIA symptoms now for a year. He is tolerating Aggrenox well without any side effects and states his last lipid profile was done 6 weeks ago was fine. He is due for his annual physical exam in  2 weeks when he'll have repeat lipid profile checked. His blood pressure also is under good control. He tripped and fell in July 2014 and fractured his right radius and underwent surgical treatment which seems to have healed well. He has regained almost full strength in right hand but still has some diminished fine motor skills and mild pain in the right wrist. ROS:   14 system review of systems is positive for right wrist pain and hand weakness only. PMH:  Past Medical History  Diagnosis Date  . Hypertension   . Erectile dysfunction   . Hyperlipidemia   . Anxiety   . Depression   . GERD (gastroesophageal reflux disease)   . Diverticula, colon   . Syncope     History  . Mitral regurgitation   . Peptic ulcer disease     with Duodenal and Antral ulcers  . H. pylori infection   . HYPERLIPIDEMIA 05/12/2008  . ANXIETY 05/12/2008  . DEPRESSION 05/12/2008  . HYPERTENSION 05/12/2008  . GERD 05/12/2008  . PEPTIC ULCER DISEASE 05/12/2008  . DIVERTICULOSIS, COLON 05/12/2008  . OVERACTIVE BLADDER 05/12/2008  . Fracture of knee region 1974    with residual weakness    Social History:  History   Social History  . Marital Status: Married    Spouse Name: N/A    Number  of Children: N/A  . Years of Education: N/A   Occupational History  . Not on file.   Social History Main Topics  . Smoking status: Never Smoker   . Smokeless tobacco: Not on file  . Alcohol Use: No  . Drug Use: No  . Sexual Activity:    Other Topics Concern  . Not on file   Social History Narrative   Pt's son is an Teacher, English as a foreign language. Patient is retired from [employer]    Medications:   Current Outpatient Prescriptions on File Prior to Visit  Medication Sig Dispense Refill  . lisinopril (PRINIVIL,ZESTRIL) 5 MG tablet Take 1 tablet (5 mg total) by mouth daily.  30 tablet  10  . simvastatin (ZOCOR) 20 MG tablet Take 1 tablet (20 mg total) by mouth daily at 6 PM.  30 tablet  10   No current facility-administered medications on file prior to visit.    Allergies:  No Known Allergies  Physical Exam General: well developed, well nourished, seated, in no evident distress Head: head normocephalic and atraumatic. Orohparynx benign Neck: supple with no carotid or supraclavicular bruits Cardiovascular: regular rate and rhythm, no murmurs Musculoskeletal: no deformity Skin:  no rash/petichiae Vascular:  Normal pulses all extremities Filed Vitals:   12/14/12 1414  BP: 120/68  Pulse: 64  Temp: 97.7 F (36.5 C)    Neurologic Exam Mental  Status: Awake and fully alert. Oriented to place and time. Recent and remote memory intact. Attention span, concentration and fund of knowledge appropriate. Mood and affect appropriate.  Cranial Nerves: Fundoscopic exam not done   Pupils equal, briskly reactive to light. Extraocular movements full without nystagmus. Visual fields full to confrontation. Hearing intact. Facial sensation intact. Face, tongue, palate moves normally and symmetrically.  Motor: Normal bulk and tone. Normal strength in all tested extremity muscles. Diminished fine finger movements in the right hand do to recent fracture and surgery. Orbits left over right upper  extremity. Sensory.: intact to touch and pinprick and vibratory sensation bilaterally..  Coordination: Rapid alternating movements normal in all extremities. Finger-to-nose and heel-to-shin performed accurately bilaterally. Gait and Station: Arises from chair without difficulty. Stance is normal. Gait demonstrates normal stride length and balance . Able to heel, toe and tandem walk without difficulty.  Reflexes: 1+ and symmetric. Toes downgoing.     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 Vascular risk factors of hypertension and hyperlipidemia. Recent right wrist radius fracture in July 2013 treated surgically with good healing   PLAN: Continue Aggrenox for secondary stroke prevention. Patient was given  refills. Continue Lipitor with an LDL cholesterol goal below 100 mg percent and strict control of hypertension with blood pressure goal below 130/90. Check followup carotid ultrasound study. Return for followup in 6 months with Levester Fresh, NP or call earlier if necessary.

## 2012-12-17 ENCOUNTER — Ambulatory Visit (INDEPENDENT_AMBULATORY_CARE_PROVIDER_SITE_OTHER): Payer: Medicare Other

## 2012-12-17 DIAGNOSIS — I6350 Cerebral infarction due to unspecified occlusion or stenosis of unspecified cerebral artery: Secondary | ICD-10-CM

## 2012-12-25 ENCOUNTER — Encounter (HOSPITAL_BASED_OUTPATIENT_CLINIC_OR_DEPARTMENT_OTHER): Payer: Self-pay | Admitting: *Deleted

## 2012-12-25 ENCOUNTER — Other Ambulatory Visit (INDEPENDENT_AMBULATORY_CARE_PROVIDER_SITE_OTHER): Payer: Medicare Other

## 2012-12-25 ENCOUNTER — Emergency Department (HOSPITAL_BASED_OUTPATIENT_CLINIC_OR_DEPARTMENT_OTHER): Payer: Medicare Other

## 2012-12-25 ENCOUNTER — Emergency Department (HOSPITAL_BASED_OUTPATIENT_CLINIC_OR_DEPARTMENT_OTHER)
Admission: EM | Admit: 2012-12-25 | Discharge: 2012-12-25 | Disposition: A | Discharge status: Home / Self Care | Payer: Medicare Other | Attending: Emergency Medicine | Admitting: Emergency Medicine

## 2012-12-25 DIAGNOSIS — S46912A Strain of unspecified muscle, fascia and tendon at shoulder and upper arm level, left arm, initial encounter: Secondary | ICD-10-CM

## 2012-12-25 DIAGNOSIS — Z0000 Encounter for general adult medical examination without abnormal findings: Secondary | ICD-10-CM

## 2012-12-25 DIAGNOSIS — Y9289 Other specified places as the place of occurrence of the external cause: Secondary | ICD-10-CM | POA: Insufficient documentation

## 2012-12-25 DIAGNOSIS — Z8659 Personal history of other mental and behavioral disorders: Secondary | ICD-10-CM | POA: Insufficient documentation

## 2012-12-25 DIAGNOSIS — Y9301 Activity, walking, marching and hiking: Secondary | ICD-10-CM | POA: Insufficient documentation

## 2012-12-25 DIAGNOSIS — Z8719 Personal history of other diseases of the digestive system: Secondary | ICD-10-CM | POA: Insufficient documentation

## 2012-12-25 DIAGNOSIS — Z8619 Personal history of other infectious and parasitic diseases: Secondary | ICD-10-CM | POA: Insufficient documentation

## 2012-12-25 DIAGNOSIS — W1809XA Striking against other object with subsequent fall, initial encounter: Secondary | ICD-10-CM | POA: Insufficient documentation

## 2012-12-25 DIAGNOSIS — I10 Essential (primary) hypertension: Secondary | ICD-10-CM | POA: Insufficient documentation

## 2012-12-25 DIAGNOSIS — E785 Hyperlipidemia, unspecified: Secondary | ICD-10-CM | POA: Insufficient documentation

## 2012-12-25 DIAGNOSIS — Z87448 Personal history of other diseases of urinary system: Secondary | ICD-10-CM | POA: Insufficient documentation

## 2012-12-25 DIAGNOSIS — Z8781 Personal history of (healed) traumatic fracture: Secondary | ICD-10-CM | POA: Insufficient documentation

## 2012-12-25 DIAGNOSIS — IMO0002 Reserved for concepts with insufficient information to code with codable children: Secondary | ICD-10-CM | POA: Insufficient documentation

## 2012-12-25 DIAGNOSIS — Z79899 Other long term (current) drug therapy: Secondary | ICD-10-CM | POA: Insufficient documentation

## 2012-12-25 LAB — BASIC METABOLIC PANEL
BUN: 19 mg/dL (ref 6–23)
CO2: 28 mEq/L (ref 19–32)
Calcium: 9.3 mg/dL (ref 8.4–10.5)
Chloride: 104 mEq/L (ref 96–112)
Creatinine, Ser: 1.1 mg/dL (ref 0.4–1.5)
GFR: 68.68 mL/min (ref >60.00)
Glucose, Bld: 80 mg/dL (ref 70–99)
Potassium: 4.4 mEq/L (ref 3.5–5.1)
Sodium: 141 mEq/L (ref 135–145)

## 2012-12-25 LAB — CBC WITH DIFFERENTIAL/PLATELET
Basophils Absolute: 0 10*3/uL (ref 0.0–0.1)
Basophils Relative: 0.5 % (ref 0.0–3.0)
Eosinophils Absolute: 0.1 10*3/uL (ref 0.0–0.7)
Eosinophils Relative: 1.5 % (ref 0.0–5.0)
HCT: 44 % (ref 39.0–52.0)
Hemoglobin: 15 g/dL (ref 13.0–17.0)
Lymphocytes Relative: 21.2 % (ref 12.0–46.0)
Lymphs Abs: 1.1 10*3/uL (ref 0.7–4.0)
MCHC: 34.1 g/dL (ref 30.0–36.0)
MCV: 96.2 fl (ref 78.0–100.0)
Monocytes Absolute: 0.6 10*3/uL (ref 0.1–1.0)
Monocytes Relative: 11.8 % (ref 3.0–12.0)
Neutro Abs: 3.2 10*3/uL (ref 1.4–7.7)
Neutrophils Relative %: 65 % (ref 43.0–77.0)
Platelets: 158 10*3/uL (ref 150.0–400.0)
RBC: 4.58 Mil/uL (ref 4.22–5.81)
RDW: 12.7 % (ref 11.5–14.6)
WBC: 5 10*3/uL (ref 4.5–10.5)

## 2012-12-25 LAB — TSH: TSH: 0.9 u[IU]/mL (ref 0.35–5.50)

## 2012-12-25 LAB — URINALYSIS, ROUTINE W REFLEX MICROSCOPIC
Bilirubin Urine: NEGATIVE
Hgb urine dipstick: NEGATIVE
Ketones, ur: NEGATIVE
Leukocytes, UA: NEGATIVE
Nitrite: NEGATIVE
RBC / HPF: NONE SEEN (ref 0–?)
Specific Gravity, Urine: 1.025 (ref 1.000–1.030)
Total Protein, Urine: NEGATIVE
Urine Glucose: NEGATIVE
Urobilinogen, UA: 0.2 (ref 0.0–1.0)
pH: 5.5 (ref 5.0–8.0)

## 2012-12-25 LAB — HEPATIC FUNCTION PANEL
ALT: 22 U/L (ref 0–53)
AST: 25 U/L (ref 0–37)
Albumin: 4.3 g/dL (ref 3.5–5.2)
Alkaline Phosphatase: 53 U/L (ref 39–117)
Bilirubin, Direct: 0.2 mg/dL (ref 0.0–0.3)
Total Bilirubin: 0.7 mg/dL (ref 0.3–1.2)
Total Protein: 6.4 g/dL (ref 6.0–8.3)

## 2012-12-25 LAB — LIPID PANEL
Cholesterol: 111 mg/dL (ref 0–200)
HDL: 36.5 mg/dL — ABNORMAL LOW (ref >39.00)
LDL Cholesterol: 63 mg/dL (ref 0–99)
Total CHOL/HDL Ratio: 3
Triglycerides: 59 mg/dL (ref 0.0–149.0)
VLDL: 11.8 mg/dL (ref 0.0–40.0)

## 2012-12-25 LAB — PSA: PSA: 0.57 ng/mL (ref 0.10–4.00)

## 2012-12-25 NOTE — ED Provider Notes (Signed)
CSN: [id number]     Arrival date & time 12/25/12  1932 History   First MD Initiated Contact with Patient 12/25/12 2030     Chief Complaint  Patient presents with  . Shoulder Injury   (Consider location/radiation/quality/duration/timing/severity/associated sxs/prior Treatment) Patient is a 77 y.o. male presenting with shoulder injury. The history is provided by the patient. No language interpreter was used.  Shoulder Injury This is a new problem. The current episode started today. The problem occurs constantly. The problem has been unchanged. Associated symptoms include joint swelling and myalgias. Nothing aggravates the symptoms. He has tried nothing for the symptoms. The treatment provided moderate relief.   Pt fell while walking in garden.  Pt bent over to pick up a piece of wire and fell hitting his shoulder Past Medical History  Diagnosis Date  . Hypertension   . Erectile dysfunction   . Hyperlipidemia   . Anxiety   . Depression   . GERD (gastroesophageal reflux disease)   . Diverticula, colon   . Syncope     History  . Mitral regurgitation   . Peptic ulcer disease     with Duodenal and Antral ulcers  . H. pylori infection   . HYPERLIPIDEMIA 05/12/2008  . ANXIETY 05/12/2008  . DEPRESSION 05/12/2008  . HYPERTENSION 05/12/2008  . GERD 05/12/2008  . PEPTIC ULCER DISEASE 05/12/2008  . DIVERTICULOSIS, COLON 05/12/2008  . OVERACTIVE BLADDER 05/12/2008  . Fracture of knee region 1974    with residual weakness   Past Surgical History  Procedure Laterality Date  . Appendectomy    . Tee without cardioversion  12/19/2011    Procedure: TRANSESOPHAGEAL ECHOCARDIOGRAM (TEE);  Surgeon: Pricilla Riffle, MD;  Location: [facility] ENDOSCOPY;  Service: Cardiovascular;  Laterality: N/A;   Family History  Problem Relation Age of Onset  . Parkinsonism Brother   . Dementia Brother    History  Substance Use Topics  . Smoking status: Never Smoker   . Smokeless tobacco: Not on file  . Alcohol Use: No     Review of Systems  Musculoskeletal: Positive for myalgias and joint swelling.  All other systems reviewed and are negative.    Allergies  Review of patient's allergies indicates no known allergies.  Home Medications   Current Outpatient Rx  Name  Route  Sig  Dispense  Refill  . dipyridamole-aspirin (AGGRENOX) 200-25 MG per 12 hr capsule   Oral   Take 1 capsule by mouth 2 (two) times daily.   180 capsule   3   . lisinopril (PRINIVIL,ZESTRIL) 5 MG tablet   Oral   Take 1 tablet (5 mg total) by mouth daily.   30 tablet   10   . simvastatin (ZOCOR) 20 MG tablet   Oral   Take 1 tablet (20 mg total) by mouth daily at 6 PM.   30 tablet   10    BP 132/70  Pulse 68  Temp(Src) 97.7 F (36.5 C) (Oral)  Resp 16  Ht 6' (1.829 m)  Wt 202 lb (91.627 kg)  BMI 27.39 kg/m2  SpO2 100% Physical Exam  Nursing note and vitals reviewed. Constitutional: He is oriented to person, place, and time. He appears well-developed and well-nourished.  HENT:  Head: Normocephalic and atraumatic.  Eyes: Conjunctivae and EOM are normal. Pupils are equal, round, and reactive to light.  Neck: Normal range of motion.  Cardiovascular: Normal rate.   Pulmonary/Chest: Effort normal.  Abdominal: Bowel sounds are normal.  Musculoskeletal: He exhibits tenderness.  Tender  left shoulder, decreased range of motion,  nv and ns intact  Neurological: He is alert and oriented to person, place, and time. He has normal reflexes.  Skin: Skin is warm.  Psychiatric: He has a normal mood and affect.    ED Course  Procedures (including critical care time) Labs Review Labs Reviewed - No data to display Imaging Review Dg Shoulder Left  12/25/2012   CLINICAL DATA:  Fall. Left shoulder pain.  EXAM: LEFT SHOULDER - 2+ VIEW  COMPARISON:  None.  FINDINGS: There is moderate osteoarthritis involving the glenohumeral joint. There is no acute fracture or subluxation identified. No radiopaque foreign body or soft tissue  calcifications identified.  IMPRESSION: 1. Osteoarthritis.  2. No acute fracture or subluxation noted.   Electronically Signed   By: Signa Kell M.D.   On: 12/25/2012 20:06    MDM  No diagnosis found.  Pt advised to call Dr. Mina Marble on Monday to schedule evaluation    Elson Areas, PA-C 12/25/12 2102

## 2012-12-25 NOTE — ED Notes (Signed)
Pt c/o fall x 30 mins ago in garden landing on left shoulder

## 2012-12-25 NOTE — ED Notes (Signed)
PA at bedside.

## 2012-12-26 NOTE — ED Provider Notes (Signed)
Medical screening examination/treatment/procedure(s) were performed by non-physician practitioner and as supervising physician I was immediately available for consultation/collaboration.    Christopher J. Pollina, MD 12/26/12 1520 

## 2012-12-29 ENCOUNTER — Ambulatory Visit (INDEPENDENT_AMBULATORY_CARE_PROVIDER_SITE_OTHER): Payer: Medicare Other | Admitting: Internal Medicine

## 2012-12-29 ENCOUNTER — Encounter: Payer: Self-pay | Admitting: Internal Medicine

## 2012-12-29 VITALS — BP 110/58 | HR 75 | Temp 97.3°F | Ht 73.0 in | Wt 204.0 lb

## 2012-12-29 DIAGNOSIS — R32 Unspecified urinary incontinence: Secondary | ICD-10-CM

## 2012-12-29 DIAGNOSIS — R351 Nocturia: Secondary | ICD-10-CM

## 2012-12-29 DIAGNOSIS — Z23 Encounter for immunization: Secondary | ICD-10-CM

## 2012-12-29 DIAGNOSIS — Z0000 Encounter for general adult medical examination without abnormal findings: Secondary | ICD-10-CM

## 2012-12-29 NOTE — Patient Instructions (Addendum)
You had the new kind of pneumonia shot today, and the flu shot Please continue all other medications as before, and refills have been done if requested. Please have the pharmacy call with any other refills you may need. Please continue your efforts at being more active, low cholesterol diet, and weight control. You are otherwise up to date with prevention measures today. You will be contacted regarding the referral for: urology Please keep your appointments with your specialists as you have planned  - Dr Mina Marble for the shoulder  Please remember to sign up for My Chart if you have not done so, as this will be important to you in the future with finding out test results, communicating by private email, and scheduling acute appointments online when needed.  Please return in 6 months, or sooner if needed

## 2012-12-29 NOTE — Assessment & Plan Note (Signed)
With nocturia, for urology referral, UA and PSA recent d/w pt and wife

## 2012-12-29 NOTE — Progress Notes (Signed)
Subjective:    Patient ID: Jeffrey Hardy, male    DOB: 1936/02/27, 77 y.o.   MRN: [id number]  HPI  Here for wellness and f/u with wife;  Overall doing ok;  Pt denies CP, worsening SOB, DOE, wheezing, orthopnea, PND, worsening LE edema, palpitations, dizziness or syncope.  Pt denies neurological change such as new headache, facial or extremity weakness.  Pt denies polydipsia, polyuria, or low sugar symptoms. Pt states overall good compliance with treatment and medications, good tolerability, and has been trying to follow lower cholesterol diet.  Pt denies worsening depressive symptoms, suicidal ideation or panic. No fever, night sweats, wt loss, loss of appetite, or other constitutional symptoms.  Pt states good ability with ADL's, has low fall risk, home safety reviewed and adequate, no other significant changes in hearing or vision, and only occasionally active with exercise.  Recent doppler studies neg. Did have left shoudler injury with a fall, seeing Dr Dwana Curd surg he has seen for right colles fx.  Has had some increasing incontinence and nocturia, asks to see urology. Overall good compliance with treatment, and good medicine tolerability.  No new neuro symtpoms Past Medical History  Diagnosis Date  . Hypertension   . Erectile dysfunction   . Hyperlipidemia   . Anxiety   . Depression   . GERD (gastroesophageal reflux disease)   . Diverticula, colon   . Syncope     History  . Mitral regurgitation   . Peptic ulcer disease     with Duodenal and Antral ulcers  . H. pylori infection   . HYPERLIPIDEMIA 05/12/2008  . ANXIETY 05/12/2008  . DEPRESSION 05/12/2008  . HYPERTENSION 05/12/2008  . GERD 05/12/2008  . PEPTIC ULCER DISEASE 05/12/2008  . DIVERTICULOSIS, COLON 05/12/2008  . OVERACTIVE BLADDER 05/12/2008  . Fracture of knee region 1974    with residual weakness  . History of stroke 12/18/2011   Past Surgical History  Procedure Laterality Date  . Appendectomy    . Tee without cardioversion   12/19/2011    Procedure: TRANSESOPHAGEAL ECHOCARDIOGRAM (TEE);  Surgeon: Pricilla Riffle, MD;  Location: [facility] ENDOSCOPY;  Service: Cardiovascular;  Laterality: N/A;    reports that he has never smoked. He does not have any smokeless tobacco history on file. He reports that he does not drink alcohol or use illicit drugs. family history includes Dementia in his brother; Parkinsonism in his brother. No Known Allergies Current Outpatient Prescriptions on File Prior to Visit  Medication Sig Dispense Refill  . dipyridamole-aspirin (AGGRENOX) 200-25 MG per 12 hr capsule Take 1 capsule by mouth 2 (two) times daily.  180 capsule  3  . lisinopril (PRINIVIL,ZESTRIL) 5 MG tablet Take 1 tablet (5 mg total) by mouth daily.  30 tablet  10  . simvastatin (ZOCOR) 20 MG tablet Take 1 tablet (20 mg total) by mouth daily at 6 PM.  30 tablet  10   No current facility-administered medications on file prior to visit.   Review of Systems Constitutional: Negative for diaphoresis, activity change, appetite change or unexpected weight change.  HENT: Negative for hearing loss, ear pain, facial swelling, mouth sores and neck stiffness.   Eyes: Negative for pain, redness and visual disturbance.  Respiratory: Negative for shortness of breath and wheezing.   Cardiovascular: Negative for chest pain and palpitations.  Gastrointestinal: Negative for diarrhea, blood in stool, abdominal distention or other pain Genitourinary: Negative for hematuria, flank pain or change in urine volume.  Musculoskeletal: Negative for myalgias and joint swelling.  Skin: Negative for color change and wound.  Neurological: Negative for syncope and numbness. other than noted Hematological: Negative for adenopathy.  Psychiatric/Behavioral: Negative for hallucinations, self-injury, decreased concentration and agitation.      Objective:   Physical Exam BP 110/58  Pulse 75  Temp(Src) 97.3 F (36.3 C) (Oral)  Ht 6\' 1"  (1.854 m)  Wt 204 lb (92.534  kg)  BMI 26.92 kg/m2  SpO2 98% VS noted,  Constitutional: Pt is oriented to person, place, and time. Appears well-developed and well-nourished.  Head: Normocephalic and atraumatic.  Right Ear: External ear normal.  Left Ear: External ear normal.  Nose: Nose normal.  Mouth/Throat: Oropharynx is clear and moist.  Eyes: Conjunctivae and EOM are normal. Pupils are equal, round, and reactive to light.  Neck: Normal range of motion. Neck supple. No JVD present. No tracheal deviation present.  Cardiovascular: Normal rate, regular rhythm, normal heart sounds and intact distal pulses.   Pulmonary/Chest: Effort normal and breath sounds normal.  Abdominal: Soft. Bowel sounds are normal. There is no tenderness. No HSM  Musculoskeletal: Normal range of motion. Exhibits no edema.  Lymphadenopathy:  Has no cervical adenopathy.  Neurological: Pt is alert and oriented to person, place, and time. Pt has normal reflexes. No cranial nerve deficit.  Skin: Skin is warm and dry. No rash noted.  Psychiatric:  Has  normal mood and affect. Behavior is normal.     Assessment & Plan:

## 2012-12-29 NOTE — Assessment & Plan Note (Signed)

## 2012-12-29 NOTE — Addendum Note (Signed)
Addended by: Scharlene Gloss B on: 12/29/2012 02:38 PM   Modules accepted: Orders

## 2013-01-05 ENCOUNTER — Telehealth: Payer: Self-pay | Admitting: *Deleted

## 2013-01-05 NOTE — Telephone Encounter (Signed)
LMVM for pt doppler study results negative for significant narrowing of vessels. He is to call back if needed.

## 2013-01-08 ENCOUNTER — Telehealth: Payer: Self-pay | Admitting: Internal Medicine

## 2013-01-08 ENCOUNTER — Encounter: Payer: Self-pay | Admitting: Internal Medicine

## 2013-01-08 NOTE — Telephone Encounter (Signed)
01-08-13 sent pt past due letter, has not seen klein since loop placement in march 2014/mt

## 2013-02-01 ENCOUNTER — Ambulatory Visit (INDEPENDENT_AMBULATORY_CARE_PROVIDER_SITE_OTHER): Payer: Medicare Other | Admitting: *Deleted

## 2013-02-01 DIAGNOSIS — R55 Syncope and collapse: Secondary | ICD-10-CM

## 2013-02-01 LAB — PACEMAKER DEVICE OBSERVATION

## 2013-02-01 NOTE — Progress Notes (Signed)
Loop check in clinic. 1 tachy episode on 07/31/12, pt not symptomatic. 1 pause (4 sec) on 10/13/12, pt was in hospital on 10/12/12 for a fall. No symptoms resulting from pause.   ROV w/ Dr. Graciela Husbands 07/2013.

## 2013-02-15 ENCOUNTER — Encounter: Payer: Self-pay | Admitting: Internal Medicine

## 2013-02-15 ENCOUNTER — Ambulatory Visit (INDEPENDENT_AMBULATORY_CARE_PROVIDER_SITE_OTHER): Payer: Medicare Other | Admitting: *Deleted

## 2013-02-15 DIAGNOSIS — R55 Syncope and collapse: Secondary | ICD-10-CM

## 2013-02-24 LAB — MDC_IDC_ENUM_SESS_TYPE_REMOTE

## 2013-03-16 ENCOUNTER — Telehealth: Payer: Self-pay | Admitting: *Deleted

## 2013-03-16 ENCOUNTER — Ambulatory Visit (INDEPENDENT_AMBULATORY_CARE_PROVIDER_SITE_OTHER): Payer: Medicare Other | Admitting: *Deleted

## 2013-03-16 DIAGNOSIS — R55 Syncope and collapse: Secondary | ICD-10-CM

## 2013-03-16 NOTE — Telephone Encounter (Signed)
Spoke w/wife in regards to getting pt to send full transmission on ILR. Pt had episodes but unable to look at it until full interrogation is sent. Per wife to send around 1330.

## 2013-03-17 LAB — MDC_IDC_ENUM_SESS_TYPE_REMOTE

## 2013-04-05 ENCOUNTER — Encounter: Payer: Self-pay | Admitting: Internal Medicine

## 2013-05-20 ENCOUNTER — Ambulatory Visit (INDEPENDENT_AMBULATORY_CARE_PROVIDER_SITE_OTHER): Payer: Medicare Other | Admitting: *Deleted

## 2013-05-20 DIAGNOSIS — Z8673 Personal history of transient ischemic attack (TIA), and cerebral infarction without residual deficits: Secondary | ICD-10-CM

## 2013-05-20 LAB — MDC_IDC_ENUM_SESS_TYPE_REMOTE

## 2013-05-21 ENCOUNTER — Encounter: Payer: Self-pay | Admitting: Internal Medicine

## 2013-06-14 ENCOUNTER — Ambulatory Visit: Payer: Medicare Other | Admitting: Nurse Practitioner

## 2013-06-16 ENCOUNTER — Other Ambulatory Visit: Payer: Self-pay | Admitting: Internal Medicine

## 2013-06-21 ENCOUNTER — Ambulatory Visit (INDEPENDENT_AMBULATORY_CARE_PROVIDER_SITE_OTHER): Payer: Medicare Other | Admitting: *Deleted

## 2013-06-21 DIAGNOSIS — R55 Syncope and collapse: Secondary | ICD-10-CM

## 2013-06-21 LAB — MDC_IDC_ENUM_SESS_TYPE_REMOTE

## 2013-06-25 ENCOUNTER — Ambulatory Visit (INDEPENDENT_AMBULATORY_CARE_PROVIDER_SITE_OTHER): Payer: Medicare Other | Admitting: Nurse Practitioner

## 2013-06-25 ENCOUNTER — Encounter: Payer: Self-pay | Admitting: Nurse Practitioner

## 2013-06-25 VITALS — BP 122/67 | HR 83 | Ht 72.0 in | Wt 294.0 lb

## 2013-06-25 DIAGNOSIS — B029 Zoster without complications: Secondary | ICD-10-CM

## 2013-06-25 DIAGNOSIS — I6350 Cerebral infarction due to unspecified occlusion or stenosis of unspecified cerebral artery: Secondary | ICD-10-CM

## 2013-06-25 MED ORDER — PREGABALIN 50 MG PO CAPS: 50.0000 mg | ORAL_CAPSULE | Freq: Three times a day (TID) | ORAL | Status: DC

## 2013-06-25 MED ORDER — ASPIRIN-DIPYRIDAMOLE ER 25-200 MG PO CP12: 1.0000 | ORAL_CAPSULE | Freq: Two times a day (BID) | ORAL | Status: DC

## 2013-06-25 MED ORDER — VALACYCLOVIR HCL 1 G PO TABS: 1000.0000 mg | ORAL_TABLET | Freq: Three times a day (TID) | ORAL | Status: DC

## 2013-06-25 NOTE — Progress Notes (Signed)
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 Vascular risk factors of hypertension and hyperlipidemia.   Update 06/25/13 (LL):  He returns for followup today after last visit with Dr. [name] on 12/14/12. He continues to do well from a neurovascular standpoint and has not had any stroke or TIA symptoms now for a year. He is tolerating Aggrenox well without any side effects and states his last lipid profile was done 6 weeks ago was fine. He is due for his annual physical exam in next week when he'll have repeat lipid profile checked. His blood pressure also is under good control.  Last carotid ultrasound on 12/17/12 showed no hemodynamically significant stenosis.  He mentions that he has pain in his right ribs after helping move a refrigerator a few days ago and his wife mentions a skin rash which is actually shingles.  ROS:  14 system review of systems is positive for runny nose and weakness only.   ALLERGIES: No Known Allergies  HOME MEDICATIONS: Outpatient Prescriptions Prior to Visit  Medication Sig Dispense Refill  . lisinopril (PRINIVIL,ZESTRIL) 5 MG tablet TAKE ONE (1) TABLET BY MOUTH EVERY DAY  30 tablet  6  . dipyridamole-aspirin (AGGRENOX) 200-25 MG per 12 hr capsule Take 1 capsule by mouth 2 (two) times daily.  180 capsule  3  . simvastatin (ZOCOR) 20 MG tablet Take 1 tablet (20 mg total) by mouth daily at 6 PM.  30 tablet  10   No facility-administered medications prior to visit.     PHYSICAL EXAM  Filed Vitals:   06/25/13 1535  BP: 122/67  Pulse: 83  Height: 6' (1.829 m)  Weight: 294 lb (133.358 kg)   Body mass index is 39.86 kg/(m^2).  Physical Exam  General: well developed, well nourished, seated, in no evident distress  Head: head normocephalic and  atraumatic. Orohparynx benign  Neck: supple with no carotid or supraclavicular bruits  Cardiovascular: regular rate and rhythm, no murmurs  Musculoskeletal: no deformity  Skin: painful, unilateral vesicular eruption right T9-10 distribution, front and back Vascular: Normal pulses all extremities   Neurologic Exam  Mental Status: Awake and fully alert. Oriented to place and time. Recent and remote memory intact. Attention span, concentration and fund of knowledge appropriate. Mood and affect appropriate.  Cranial Nerves: Fundoscopic exam not done. Pupils equal, briskly reactive to light. Extraocular movements full without nystagmus. Visual fields full to confrontation. Hearing intact. Facial sensation intact. Face, tongue, palate moves normally and symmetrically.  Motor: Normal bulk and tone. Normal strength in all tested extremity muscles. Diminished fine finger movements in the right hand. Orbits left over right upper extremity.  Sensory: intact to touch and pinprick and vibratory sensation bilaterally.  Coordination: Rapid alternating movements normal in all extremities. Finger-to-nose and heel-to-shin performed accurately bilaterally.  Gait and Station: Arises from chair without difficulty. Stance is normal. Gait demonstrates normal stride length and balance . Able to heel, toe and tandem walk without difficulty.  Reflexes: 1+ and symmetric. Toes downgoing.    ASSESSMENT AND PLAN 22 year Caucasian male with a right frontal and left frontal embolic infarcts in September 2013 without definite identified source. Vascular risk factors of hypertension and hyperlipidemia. New shingles eruption in T9-10 distribution.  PLAN:  Continue Aggrenox for secondary stroke prevention. Patient was given refills.  Continue Lipitor with an LDL cholesterol goal below 100 mg percent and strict control of hypertension with blood pressure goal below 130/90.  Start Valcyclovir 1000 mg TID x 7 days for shingles  eruption.  Lyrica 50 mg capsule samples given for pain associated with rash.  Side effects discussed.  Return for followup in 6 months with Charlott Holler, NP or call earlier if necessary.   Meds ordered this encounter  Medications  . tamsulosin (FLOMAX) 0.4 MG CAPS capsule    Sig:   . dipyridamole-aspirin (AGGRENOX) 200-25 MG per 12 hr capsule    Sig: Take 1 capsule by mouth 2 (two) times daily.    Dispense:  180 capsule    Refill:  4    Order Specific Question:  Supervising Provider    Answer:  [name], PRAMOD S [[id number]]  . valACYclovir (VALTREX) 1000 MG tablet    Sig: Take 1 tablet (1,000 mg total) by mouth 3 (three) times daily.    Dispense:  21 tablet    Refill:  0    Order Specific Question:  Supervising Provider    Answer:  [name], PRAMOD S [[id number]]  . pregabalin (LYRICA) 50 MG capsule    Sig: Take 1 capsule (50 mg total) by mouth 3 (three) times daily.    Dispense:  84 capsule    Refill:  0    4 sample packs given, 21 capsules each.    Order Specific Question:  Supervising Provider    Answer:  [name] [[id number]]   Rudi Rummage LAM, MSN, NP-C 06/26/2013, 10:31 AM [facility] [address], [place], [place] [address] [phone]  Note: This document was prepared with digital dictation and possible smart phrase technology. Any transcriptional errors that result from this process are unintentional.

## 2013-06-25 NOTE — Patient Instructions (Addendum)
PLAN:  Continue Aggrenox for secondary stroke prevention. Patient was given refills. Continue Lipitor with an LDL cholesterol goal below 100 mg percent and strict control of hypertension with blood pressure goal below 130/90. Return for followup in 6 months with Charlott Holler, NP or call earlier if necessary.   Valcyclovir - Take 1 tablet three times a day for 7 days.  Samples of Lyrica 50 mg given for pain associated with shingles.  May take 1 every 3 hours.  May cause dizziness and drowsiness, so please take care.

## 2013-06-26 ENCOUNTER — Encounter: Payer: Self-pay | Admitting: Nurse Practitioner

## 2013-06-29 ENCOUNTER — Ambulatory Visit (INDEPENDENT_AMBULATORY_CARE_PROVIDER_SITE_OTHER): Payer: Medicare Other | Admitting: Internal Medicine

## 2013-06-29 ENCOUNTER — Other Ambulatory Visit (INDEPENDENT_AMBULATORY_CARE_PROVIDER_SITE_OTHER): Payer: Medicare Other

## 2013-06-29 ENCOUNTER — Encounter: Payer: Self-pay | Admitting: Internal Medicine

## 2013-06-29 VITALS — BP 102/68 | HR 62 | Temp 97.7°F | Ht 73.0 in | Wt 200.2 lb

## 2013-06-29 DIAGNOSIS — E785 Hyperlipidemia, unspecified: Secondary | ICD-10-CM

## 2013-06-29 DIAGNOSIS — B029 Zoster without complications: Secondary | ICD-10-CM | POA: Insufficient documentation

## 2013-06-29 DIAGNOSIS — K219 Gastro-esophageal reflux disease without esophagitis: Secondary | ICD-10-CM

## 2013-06-29 DIAGNOSIS — I10 Essential (primary) hypertension: Secondary | ICD-10-CM

## 2013-06-29 LAB — LIPID PANEL
Cholesterol: 133 mg/dL (ref 0–200)
HDL: 30.3 mg/dL — ABNORMAL LOW (ref >39.00)
LDL Cholesterol: 81 mg/dL (ref 0–99)
Total CHOL/HDL Ratio: 4
Triglycerides: 110 mg/dL (ref 0.0–149.0)
VLDL: 22 mg/dL (ref 0.0–40.0)

## 2013-06-29 NOTE — Progress Notes (Signed)
Pre visit review using our clinic review tool, if applicable. No additional management support is needed unless otherwise documented below in the visit note. 

## 2013-06-29 NOTE — Progress Notes (Signed)
Subjective:    Patient ID: Jeffrey Hardy, male    DOB: 1935-04-19, 78 y.o.   MRN: [id number]  HPI  Here to f/u; overall doing ok,  Pt denies chest pain, increased sob or doe, wheezing, orthopnea, PND, increased LE swelling, palpitations, dizziness or syncope.  Pt denies polydipsia, polyuria, or low sugar symptoms such as weakness or confusion improved with po intake.  Pt denies new neurological symptoms such as new headache, or facial or extremity weakness or numbness.   Pt states overall good compliance with meds, has been trying to follow lower cholesterol diet, with wt overall stable.  Did not tolerate zocor due to "rubber legs" so stopped, not taking any statin at this time, willing to re-try different statin if LDL eleveated.  Tolerating lyrica for right mid abd shingles, and valtrex per pharmacy. Pain near resolved. Tolerates lyrica ok.  Denies worsening reflux, abd pain, dysphagia, n/v, bowel change or blood. Past Medical History  Diagnosis Date  . Hypertension   . Erectile dysfunction   . Hyperlipidemia   . Anxiety   . Depression   . GERD (gastroesophageal reflux disease)   . Diverticula, colon   . Syncope     History  . Mitral regurgitation   . Peptic ulcer disease     with Duodenal and Antral ulcers  . H. pylori infection   . HYPERLIPIDEMIA 05/12/2008  . ANXIETY 05/12/2008  . DEPRESSION 05/12/2008  . HYPERTENSION 05/12/2008  . GERD 05/12/2008  . PEPTIC ULCER DISEASE 05/12/2008  . DIVERTICULOSIS, COLON 05/12/2008  . OVERACTIVE BLADDER 05/12/2008  . Fracture of knee region 1974    with residual weakness  . History of stroke 12/18/2011   Past Surgical History  Procedure Laterality Date  . Appendectomy    . Tee without cardioversion  12/19/2011    Procedure: TRANSESOPHAGEAL ECHOCARDIOGRAM (TEE);  Surgeon: [name], MD;  Location: [facility] ENDOSCOPY;  Service: Cardiovascular;  Laterality: N/A;    reports that he has never smoked. He does not have any smokeless tobacco history on file. He  reports that he does not drink alcohol or use illicit drugs. family history includes Dementia in his brother; Parkinsonism in his brother. No Known Allergies Current Outpatient Prescriptions on File Prior to Visit  Medication Sig Dispense Refill  . dipyridamole-aspirin (AGGRENOX) 200-25 MG per 12 hr capsule Take 1 capsule by mouth 2 (two) times daily.  180 capsule  4  . lisinopril (PRINIVIL,ZESTRIL) 5 MG tablet TAKE ONE (1) TABLET BY MOUTH EVERY DAY  30 tablet  6  . pregabalin (LYRICA) 50 MG capsule Take 1 capsule (50 mg total) by mouth 3 (three) times daily.  84 capsule  0  . tamsulosin (FLOMAX) 0.4 MG CAPS capsule       . valACYclovir (VALTREX) 1000 MG tablet Take 1 tablet (1,000 mg total) by mouth 3 (three) times daily.  21 tablet  0   No current facility-administered medications on file prior to visit.   Review of Systems  Constitutional: Negative for unexpected weight change, or unusual diaphoresis  HENT: Negative for tinnitus.   Eyes: Negative for photophobia and visual disturbance.  Respiratory: Negative for choking and stridor.   Gastrointestinal: Negative for vomiting and blood in stool.  Genitourinary: Negative for hematuria and decreased urine volume.  Musculoskeletal: Negative for acute joint swelling Skin: Negative for color change and wound.  Neurological: Negative for tremors and numbness other than noted  Psychiatric/Behavioral: Negative for decreased concentration or  hyperactivity.  Objective:   Physical Exam BP 102/68  Pulse 62  Temp(Src) 97.7 F (36.5 C) (Oral)  Ht 6\' 1"  (1.854 m)  Wt 200 lb 4 oz (90.833 kg)  BMI 26.43 kg/m2  SpO2 96% VS noted, not ill appearing Constitutional: Pt appears well-developed and well-nourished.  HENT: Head: NCAT.  Right Ear: External ear normal.  Left Ear: External ear normal.  Eyes: Conjunctivae and EOM are normal. Pupils are equal, round, and reactive to light.  Neck: Normal range of motion. Neck supple.    Cardiovascular: Normal rate and regular rhythm.   Pulmonary/Chest: Effort normal and breath sounds normal.  Abd:  Soft, NT, non-distended, + BS Neurological: Pt is alert. Not confused  Skin: Skin is warm. No erythema. Rash resolving and crusted right lateral mid abd, NT  Psychiatric: Pt behavior is normal. Thought content normal.     Assessment & Plan:

## 2013-06-29 NOTE — Assessment & Plan Note (Signed)
stable overall by history and exam, recent data reviewed with pt, and pt to continue medical treatment as before,  to f/u any worsening symptoms or concerns BP Readings from Last 3 Encounters:  06/29/13 102/68  06/25/13 122/67  12/29/12 110/58

## 2013-06-29 NOTE — Assessment & Plan Note (Signed)
Improved, to finish valtrex, lyrica prn

## 2013-06-29 NOTE — Patient Instructions (Signed)
Please continue all other medications as before, and refills have been done if requested. Please have the pharmacy call with any other refills you may need.  Please continue your efforts at being more active, low cholesterol diet, and weight control.  Please go to the LAB in the Basement (turn left off the elevator) for the tests to be done today - just the cholesterol today  You will be contacted by phone if any changes need to be made immediately.  Otherwise, you will receive a letter about your results with an explanation, but please check with MyChart first.  Please keep your appointments with your specialists as you have planned  Please return in 6 months, or sooner if needed

## 2013-06-29 NOTE — Assessment & Plan Note (Signed)
stable overall by history and exam, recent data reviewed with pt, and pt to continue medical treatment as before,  to f/u any worsening symptoms or concerns Lab Results  Component Value Date   WBC 5.0 12/25/2012   HGB 15.0 12/25/2012   HCT 44.0 12/25/2012   PLT 158.0 12/25/2012   GLUCOSE 80 12/25/2012   CHOL 133 06/29/2013   TRIG 110.0 06/29/2013   HDL 30.30* 06/29/2013   LDLCALC 81 06/29/2013   ALT 22 12/25/2012   AST 25 12/25/2012   NA 141 12/25/2012   K 4.4 12/25/2012   CL 104 12/25/2012   CREATININE 1.1 12/25/2012   BUN 19 12/25/2012   CO2 28 12/25/2012   TSH 0.90 12/25/2012   PSA 0.57 12/25/2012   INR 1.02 01/04/2012   HGBA1C 5.4 12/17/2011

## 2013-06-29 NOTE — Assessment & Plan Note (Signed)
stable overall by history and exam, recent data reviewed with pt, and pt to continue medical treatment as before,  to f/u any worsening symptoms or concerns Lab Results  Component Value Date   LDLCALC 81 06/29/2013

## 2013-07-12 ENCOUNTER — Encounter: Payer: Self-pay | Admitting: Internal Medicine

## 2013-07-19 ENCOUNTER — Ambulatory Visit (INDEPENDENT_AMBULATORY_CARE_PROVIDER_SITE_OTHER): Payer: Medicare Other | Admitting: *Deleted

## 2013-07-19 DIAGNOSIS — Z8673 Personal history of transient ischemic attack (TIA), and cerebral infarction without residual deficits: Secondary | ICD-10-CM

## 2013-07-19 LAB — MDC_IDC_ENUM_SESS_TYPE_REMOTE
Date Time Interrogation Session: 20150413025250
Zone Setting Detection Interval: 2000 ms
Zone Setting Detection Interval: 3000 ms
Zone Setting Detection Interval: 380 ms

## 2013-08-18 ENCOUNTER — Encounter: Payer: Self-pay | Admitting: *Deleted

## 2013-08-20 ENCOUNTER — Ambulatory Visit (INDEPENDENT_AMBULATORY_CARE_PROVIDER_SITE_OTHER): Payer: Medicare Other | Admitting: *Deleted

## 2013-08-20 DIAGNOSIS — Z8673 Personal history of transient ischemic attack (TIA), and cerebral infarction without residual deficits: Secondary | ICD-10-CM

## 2013-08-24 ENCOUNTER — Encounter: Payer: Self-pay | Admitting: Internal Medicine

## 2013-09-07 ENCOUNTER — Ambulatory Visit (INDEPENDENT_AMBULATORY_CARE_PROVIDER_SITE_OTHER): Payer: Medicare Other | Admitting: Internal Medicine

## 2013-09-07 ENCOUNTER — Encounter: Payer: Self-pay | Admitting: Internal Medicine

## 2013-09-07 VITALS — BP 111/66 | HR 63 | Ht 73.0 in | Wt 201.0 lb

## 2013-09-07 DIAGNOSIS — Z8673 Personal history of transient ischemic attack (TIA), and cerebral infarction without residual deficits: Secondary | ICD-10-CM

## 2013-09-07 DIAGNOSIS — Z4509 Encounter for adjustment and management of other cardiac device: Secondary | ICD-10-CM

## 2013-09-07 DIAGNOSIS — R55 Syncope and collapse: Secondary | ICD-10-CM

## 2013-09-07 LAB — MDC_IDC_ENUM_SESS_TYPE_INCLINIC

## 2013-09-07 NOTE — Patient Instructions (Signed)
Your physician recommends that you continue on your current medications as directed. Please refer to the Current Medication list given to you today.  Your physician wants you to follow-up in: 1 year with Dr. Klein.  You will receive a reminder letter in the mail two months in advance. If you don't receive a letter, please call our office to schedule the follow-up appointment.  

## 2013-09-07 NOTE — Progress Notes (Signed)
      Patient Care Team: Biagio Borg, MD as PCP - General   HPI  Jeffrey Hardy is a 78 y.o. male Seen in followup for a cryptogenic stroke for which he underwent loop recorder insertion 3/14   He has recovered fully.  Echo 2013 demonstrated normal left ventricular function and normal left atrial dimensions (30/1.3)  Past Medical History  Diagnosis Date  . Hypertension   . Erectile dysfunction   . Hyperlipidemia   . Anxiety   . Depression   . GERD (gastroesophageal reflux disease)   . Diverticula, colon   . Syncope     History  . Mitral regurgitation   . Peptic ulcer disease     with Duodenal and Antral ulcers  . H. pylori infection   . HYPERLIPIDEMIA 05/12/2008  . ANXIETY 05/12/2008  . DEPRESSION 05/12/2008  . HYPERTENSION 05/12/2008  . GERD 05/12/2008  . PEPTIC ULCER DISEASE 05/12/2008  . DIVERTICULOSIS, COLON 05/12/2008  . OVERACTIVE BLADDER 05/12/2008  . Fracture of knee region 1974    with residual weakness  . History of stroke 12/18/2011    Past Surgical History  Procedure Laterality Date  . Appendectomy    . Tee without cardioversion  12/19/2011    Procedure: TRANSESOPHAGEAL ECHOCARDIOGRAM (TEE);  Surgeon: [name], MD;  Location: [facility] ENDOSCOPY;  Service: Cardiovascular;  Laterality: N/A;    Current Outpatient Prescriptions  Medication Sig Dispense Refill  . dipyridamole-aspirin (AGGRENOX) 200-25 MG per 12 hr capsule Take 1 capsule by mouth 2 (two) times daily.  180 capsule  4  . lisinopril (PRINIVIL,ZESTRIL) 5 MG tablet TAKE ONE (1) TABLET BY MOUTH EVERY DAY  30 tablet  6   No current facility-administered medications for this visit.    No Known Allergies  Review of Systems negative except from HPI and PMH  Physical Exam BP 111/66  Pulse 63  Ht 6\' 1"  (1.854 m)  Wt 201 lb (91.173 kg)  BMI 26.52 kg/m2 Well developed and well nourished in no acute distress HENT normal E scleral and icterus clear Neck Supple JVP flat; carotids brisk and full Clear to  ausculation Device pocket well healed; without hematoma or erythema.  There is no tethering Regular rate and rhythm, no murmurs gallops or rub Soft with active bowel sounds No clubbing cyanosis  Edema Alert and oriented, grossly normal motor and sensory function Skin Warm and Dry  ECG demonstrates sinus 63 Intervals 14/10/46 axis normal 80  Loop recorder interrogation demonstrates nonsustained atrial tachycardia lasting 5 seconds. Atrial rate may be as as 200 ms nonsustained ventricular tachycardia initiated by PVCs      Assessment and  Plan  Cryptogenic stroke  Supraventricular tachycardia-probable atrial tachycardia  Ventricular tachycardia-nonsustained  Implantable loop recorder-LINQ  Device interrogation as noted above. Discussion regarding how much atrial tachycardia constitutes sufficient to justify change his anticoagulation. I'm not sure that 5 seconds does. We'll continue him on his Aggrenox.  Ventricular tachycardia in  normal LV function need not .prompt further evaluation. Electrolytes were normal last fall

## 2013-09-21 ENCOUNTER — Ambulatory Visit (INDEPENDENT_AMBULATORY_CARE_PROVIDER_SITE_OTHER): Payer: Medicare Other | Admitting: *Deleted

## 2013-09-21 DIAGNOSIS — I639 Cerebral infarction, unspecified: Secondary | ICD-10-CM

## 2013-09-21 DIAGNOSIS — I6350 Cerebral infarction due to unspecified occlusion or stenosis of unspecified cerebral artery: Secondary | ICD-10-CM

## 2013-09-21 LAB — MDC_IDC_ENUM_SESS_TYPE_REMOTE

## 2013-09-29 ENCOUNTER — Other Ambulatory Visit: Payer: Self-pay | Admitting: Cardiology

## 2013-10-01 NOTE — Progress Notes (Signed)
Loop recorder 

## 2013-10-18 ENCOUNTER — Ambulatory Visit (INDEPENDENT_AMBULATORY_CARE_PROVIDER_SITE_OTHER): Payer: Medicare Other | Admitting: *Deleted

## 2013-10-18 DIAGNOSIS — I639 Cerebral infarction, unspecified: Secondary | ICD-10-CM

## 2013-10-18 DIAGNOSIS — I6350 Cerebral infarction due to unspecified occlusion or stenosis of unspecified cerebral artery: Secondary | ICD-10-CM

## 2013-10-25 ENCOUNTER — Encounter: Payer: Self-pay | Admitting: Internal Medicine

## 2013-10-27 NOTE — Progress Notes (Signed)
Loop recorder 

## 2013-11-22 ENCOUNTER — Ambulatory Visit (INDEPENDENT_AMBULATORY_CARE_PROVIDER_SITE_OTHER): Payer: Medicare Other | Admitting: *Deleted

## 2013-11-22 DIAGNOSIS — I639 Cerebral infarction, unspecified: Secondary | ICD-10-CM

## 2013-11-22 DIAGNOSIS — I6350 Cerebral infarction due to unspecified occlusion or stenosis of unspecified cerebral artery: Secondary | ICD-10-CM

## 2013-11-26 NOTE — Progress Notes (Signed)
Loop recorder 

## 2013-12-08 LAB — MDC_IDC_ENUM_SESS_TYPE_REMOTE

## 2013-12-16 ENCOUNTER — Ambulatory Visit (INDEPENDENT_AMBULATORY_CARE_PROVIDER_SITE_OTHER): Payer: Medicare Other | Admitting: *Deleted

## 2013-12-16 DIAGNOSIS — I639 Cerebral infarction, unspecified: Secondary | ICD-10-CM

## 2013-12-16 DIAGNOSIS — I6350 Cerebral infarction due to unspecified occlusion or stenosis of unspecified cerebral artery: Secondary | ICD-10-CM

## 2013-12-16 LAB — MDC_IDC_ENUM_SESS_TYPE_REMOTE

## 2013-12-17 ENCOUNTER — Encounter: Payer: Self-pay | Admitting: Internal Medicine

## 2013-12-21 LAB — MDC_IDC_ENUM_SESS_TYPE_REMOTE
Date Time Interrogation Session: 20150815021444
Zone Setting Detection Interval: 2000 ms
Zone Setting Detection Interval: 3000 ms
Zone Setting Detection Interval: 380 ms

## 2013-12-27 ENCOUNTER — Ambulatory Visit: Payer: Medicare Other | Admitting: Nurse Practitioner

## 2013-12-27 LAB — MDC_IDC_ENUM_SESS_TYPE_REMOTE
Date Time Interrogation Session: 20150907072445
Zone Setting Detection Interval: 2000 ms
Zone Setting Detection Interval: 3000 ms
Zone Setting Detection Interval: 380 ms

## 2013-12-28 ENCOUNTER — Encounter: Payer: Self-pay | Admitting: Nurse Practitioner

## 2013-12-28 ENCOUNTER — Ambulatory Visit (INDEPENDENT_AMBULATORY_CARE_PROVIDER_SITE_OTHER): Payer: Medicare Other | Admitting: Nurse Practitioner

## 2013-12-28 VITALS — BP 120/79 | HR 69 | Wt 207.2 lb

## 2013-12-28 DIAGNOSIS — I6350 Cerebral infarction due to unspecified occlusion or stenosis of unspecified cerebral artery: Secondary | ICD-10-CM

## 2013-12-28 DIAGNOSIS — I672 Cerebral atherosclerosis: Secondary | ICD-10-CM

## 2013-12-28 DIAGNOSIS — Z8673 Personal history of transient ischemic attack (TIA), and cerebral infarction without residual deficits: Secondary | ICD-10-CM

## 2013-12-28 MED ORDER — ASPIRIN-DIPYRIDAMOLE ER 25-200 MG PO CP12: 1.0000 | ORAL_CAPSULE | Freq: Two times a day (BID) | ORAL | Status: DC

## 2013-12-28 NOTE — Progress Notes (Signed)
Loop recorder 

## 2013-12-28 NOTE — Progress Notes (Signed)
PATIENT: Jeffrey Hardy DOB: 06/12/1935  REASON FOR VISIT: routine follow up for stroke HISTORY FROM: patient, wife  HISTORY OF PRESENT ILLNESS: 78 year-old Caucasian male with a right frontal and left frontal embolic infarcts in September 2013 without definite identified source. Vascular risk factors of hypertension and hyperlipidemia.   [name] returns for stroke followup, last visit was with me on 06/25/13. He continues to do well from a neurovascular standpoint and has not had any stroke or TIA symptoms now for over a year. He is tolerating Aggrenox well without any side effects.  He is due for his annual physical exam soon when he'll have repeat lipid profile checked.  He was not able to tolerate Lipitor in the past, he states that it made his legs feel "rubbery."  His blood pressure also is under good control, even on the low side.  His wife states that he falls asleep easily and doesn't seem to have the energy he used to.  Last carotid ultrasound on 12/17/12 showed no hemodynamically significant stenosis.   REVIEW OF SYSTEMS: Full 14 system review of systems performed and notable only for:  Fatigue only.   ALLERGIES: No Known Allergies  HOME MEDICATIONS: Outpatient Prescriptions Prior to Visit  Medication Sig Dispense Refill  . lisinopril (PRINIVIL,ZESTRIL) 5 MG tablet TAKE ONE (1) TABLET BY MOUTH EVERY DAY  30 tablet  6  . dipyridamole-aspirin (AGGRENOX) 200-25 MG per 12 hr capsule Take 1 capsule by mouth 2 (two) times daily.  180 capsule  4   No facility-administered medications prior to visit.    PHYSICAL EXAM Filed Vitals:   12/28/13 1432  BP: 120/79  Pulse: 69  Weight: 207 lb 3.2 oz (93.985 kg)   Body mass index is 27.34 kg/(m^2).  Physical Exam  General: well developed, well nourished, seated, in no evident distress  Head: head normocephalic and atraumatic. Orohparynx benign  Neck: supple with no carotid or supraclavicular bruits  Cardiovascular: regular rate  and rhythm, no murmurs  Musculoskeletal: no deformity  Vascular: Normal pulses all extremities   Neurologic Exam  Mental Status: Awake and fully alert. Oriented to place and time. Recent and remote memory intact. Attention span, concentration and fund of knowledge appropriate. Mood and affect appropriate.  Cranial Nerves: Fundoscopic exam not done. Pupils equal, briskly reactive to light. Extraocular movements full without nystagmus. Visual fields full to confrontation. Hearing intact. Facial sensation intact. Face, tongue, palate moves normally and symmetrically.  Motor: Normal bulk and tone. Normal strength in all tested extremity muscles. Diminished fine finger movements in the right hand. Orbits left over right upper extremity.  Sensory: intact to touch and pinprick and vibratory sensation bilaterally.  Coordination: Rapid alternating movements normal in all extremities. Finger-to-nose and heel-to-shin performed accurately bilaterally.  Gait and Station: Arises from chair without difficulty. Stance is normal. Gait demonstrates normal stride length and balance . Able to heel, toe and tandem walk without difficulty.  Reflexes: 1+ and symmetric. Toes downgoing.    ASSESSMENT: 33 year Caucasian male with a right frontal and left frontal embolic infarcts in September 2013 without definite identified source. Vascular risk factors of hypertension, hyperlipidemia and previous stroke.   PLAN:  Continue Aggrenox for secondary stroke prevention. Patient was given refills. LDL cholesterol goal below 100 mg percent and strict control of hypertension with blood pressure goal below 140/90.  Stop Lisinopril. Record your blood pressure twice daily. Take with you to your PCP visit later this week. If pressure consistently stays below  140/90 you may not need to restart.  Please discuss this with Dr. Jenny Reichmann.  Return for followup in 1 year with Dr. [name] or call earlier if necessary.   Meds ordered this encounter    Medications  . dipyridamole-aspirin (AGGRENOX) 200-25 MG per 12 hr capsule    Sig: Take 1 capsule by mouth 2 (two) times daily.    Dispense:  180 capsule    Refill:  4    Order Specific Question:  Supervising Provider    Answer:  Antony Contras [[id number]]   Return in about 1 year (around 12/29/2014) for stroke.  Rudi Rummage Aniruddh Ciavarella, MSN, FNP-BC, A/GNP-C 12/29/2013, 9:28 AM [facility] [address], [place], [place] [address] [phone]  Note: This document was prepared with digital dictation and possible smart phrase technology. Any transcriptional errors that result from this process are unintentional.

## 2013-12-28 NOTE — Patient Instructions (Addendum)
Continue Aggrenox for secondary stroke prevention. Patient was given refills. Strict control of hypertension with blood pressure goal below 140/90.   Stop your Lisinopril. Record your blood pressure twice daily. Take with you to your PCP visit. If pressure stays below 140/90 you may not need to restart. Return for followup in 6 months with Dr. [name] or call earlier if necessary.   Stroke Prevention Some medical conditions and behaviors are associated with an increased chance of having a stroke. You may prevent a stroke by making healthy choices and managing medical conditions. HOW CAN I REDUCE MY RISK OF HAVING A STROKE?   Stay physically active. Get at least 30 minutes of activity on most or all days.  Do not smoke. It may also be helpful to avoid exposure to secondhand smoke.  Limit alcohol use. Moderate alcohol use is considered to be:  No more than 2 drinks per day for men.  No more than 1 drink per day for nonpregnant women.  Eat healthy foods. This involves:  Eating 5 or more servings of fruits and vegetables a day.  Making dietary changes that address high blood pressure (hypertension), high cholesterol, diabetes, or obesity.  Manage your cholesterol levels.  Making food choices that are high in fiber and low in saturated fat, trans fat, and cholesterol may control cholesterol levels.  Take any prescribed medicines to control cholesterol as directed by your health care provider.  Manage your diabetes.  Controlling your carbohydrate and sugar intake is recommended to manage diabetes.  Take any prescribed medicines to control diabetes as directed by your health care provider.  Control your hypertension.  Making food choices that are low in salt (sodium), saturated fat, trans fat, and cholesterol is recommended to manage hypertension.  Take any prescribed medicines to control hypertension as directed by your health care provider.  Maintain a healthy weight.  Reducing  calorie intake and making food choices that are low in sodium, saturated fat, trans fat, and cholesterol are recommended to manage weight.  Stop drug abuse.  Avoid taking birth control pills.  Talk to your health care provider about the risks of taking birth control pills if you are over 34 years old, smoke, get migraines, or have ever had a blood clot.  Get evaluated for sleep disorders (sleep apnea).  Talk to your health care provider about getting a sleep evaluation if you snore a lot or have excessive sleepiness.  Take medicines only as directed by your health care provider.  For some people, aspirin or blood thinners (anticoagulants) are helpful in reducing the risk of forming abnormal blood clots that can lead to stroke. If you have the irregular heart rhythm of atrial fibrillation, you should be on a blood thinner unless there is a good reason you cannot take them.  Understand all your medicine instructions.  Make sure that other conditions (such as anemia or atherosclerosis) are addressed. SEEK IMMEDIATE MEDICAL CARE IF:   You have sudden weakness or numbness of the face, arm, or leg, especially on one side of the body.  Your face or eyelid droops to one side.  You have sudden confusion.  You have trouble speaking (aphasia) or understanding.  You have sudden trouble seeing in one or both eyes.  You have sudden trouble walking.  You have dizziness.  You have a loss of balance or coordination.  You have a sudden, severe headache with no known cause.  You have new chest pain or an irregular heartbeat. Any of these  symptoms may represent a serious problem that is an emergency. Do not wait to see if the symptoms will go away. Get medical help at once. Call your local emergency services (911 in [place] Do not drive yourself to the hospital. Document Released: 05/02/2004 Document Revised: 08/09/2013 Document Reviewed: 09/25/2012 [facility] Patient Information 2015 [place],  [place]. This information is not intended to replace advice given to you by your health care provider. Make sure you discuss any questions you have with your health care provider.

## 2013-12-29 NOTE — Progress Notes (Signed)
I agree with the above plan 

## 2013-12-30 ENCOUNTER — Ambulatory Visit (INDEPENDENT_AMBULATORY_CARE_PROVIDER_SITE_OTHER): Payer: Medicare Other | Admitting: Internal Medicine

## 2013-12-30 ENCOUNTER — Encounter: Payer: Self-pay | Admitting: Internal Medicine

## 2013-12-30 ENCOUNTER — Other Ambulatory Visit (INDEPENDENT_AMBULATORY_CARE_PROVIDER_SITE_OTHER): Payer: Medicare Other

## 2013-12-30 VITALS — BP 110/68 | HR 88 | Temp 97.9°F | Wt 209.2 lb

## 2013-12-30 DIAGNOSIS — I10 Essential (primary) hypertension: Secondary | ICD-10-CM

## 2013-12-30 DIAGNOSIS — Z0000 Encounter for general adult medical examination without abnormal findings: Secondary | ICD-10-CM

## 2013-12-30 DIAGNOSIS — E785 Hyperlipidemia, unspecified: Secondary | ICD-10-CM

## 2013-12-30 DIAGNOSIS — Z23 Encounter for immunization: Secondary | ICD-10-CM

## 2013-12-30 LAB — CBC WITH DIFFERENTIAL/PLATELET
Basophils Absolute: 0 10*3/uL (ref 0.0–0.1)
Basophils Relative: 0.5 % (ref 0.0–3.0)
Eosinophils Absolute: 0.1 10*3/uL (ref 0.0–0.7)
Eosinophils Relative: 2.3 % (ref 0.0–5.0)
HCT: 46.4 % (ref 39.0–52.0)
Hemoglobin: 15.4 g/dL (ref 13.0–17.0)
Lymphocytes Relative: 15.3 % (ref 12.0–46.0)
Lymphs Abs: 1 10*3/uL (ref 0.7–4.0)
MCHC: 33.2 g/dL (ref 30.0–36.0)
MCV: 98.6 fl (ref 78.0–100.0)
Monocytes Absolute: 0.8 10*3/uL (ref 0.1–1.0)
Monocytes Relative: 12.9 % — ABNORMAL HIGH (ref 3.0–12.0)
Neutro Abs: 4.3 10*3/uL (ref 1.4–7.7)
Neutrophils Relative %: 69 % (ref 43.0–77.0)
Platelets: 153 10*3/uL (ref 150.0–400.0)
RBC: 4.71 Mil/uL (ref 4.22–5.81)
RDW: 12.8 % (ref 11.5–15.5)
WBC: 6.2 10*3/uL (ref 4.0–10.5)

## 2013-12-30 LAB — LIPID PANEL
Cholesterol: 160 mg/dL (ref 0–200)
HDL: 35.6 mg/dL — ABNORMAL LOW (ref >39.00)
LDL Cholesterol: 106 mg/dL — ABNORMAL HIGH (ref 0–99)
NonHDL: 124.4
Total CHOL/HDL Ratio: 4
Triglycerides: 94 mg/dL (ref 0.0–149.0)
VLDL: 18.8 mg/dL (ref 0.0–40.0)

## 2013-12-30 LAB — TSH: TSH: 1.47 u[IU]/mL (ref 0.35–4.50)

## 2013-12-30 LAB — HEPATIC FUNCTION PANEL
ALT: 19 U/L (ref 0–53)
AST: 23 U/L (ref 0–37)
Albumin: 4.4 g/dL (ref 3.5–5.2)
Alkaline Phosphatase: 70 U/L (ref 39–117)
Bilirubin, Direct: 0.1 mg/dL (ref 0.0–0.3)
Total Bilirubin: 0.6 mg/dL (ref 0.2–1.2)
Total Protein: 6.8 g/dL (ref 6.0–8.3)

## 2013-12-30 LAB — BASIC METABOLIC PANEL
BUN: 21 mg/dL (ref 6–23)
CO2: 27 mEq/L (ref 19–32)
Calcium: 9.5 mg/dL (ref 8.4–10.5)
Chloride: 102 mEq/L (ref 96–112)
Creatinine, Ser: 1.2 mg/dL (ref 0.4–1.5)
GFR: 63.2 mL/min (ref >60.00)
Glucose, Bld: 92 mg/dL (ref 70–99)
Potassium: 4.6 mEq/L (ref 3.5–5.1)
Sodium: 138 mEq/L (ref 135–145)

## 2013-12-30 LAB — PSA: PSA: 0.57 ng/mL (ref 0.10–4.00)

## 2013-12-30 MED ORDER — PRAVASTATIN SODIUM 20 MG PO TABS: 20.0000 mg | ORAL_TABLET | Freq: Every day | ORAL | Status: DC

## 2013-12-30 NOTE — Progress Notes (Signed)
Subjective:    Patient ID: Jeffrey Hardy, male    DOB: 04/25/1935, 78 y.o.   MRN: [id number]  HPI  Here for wellness and f/u;  Overall doing ok;  Pt denies CP, worsening SOB, DOE, wheezing, orthopnea, PND, worsening LE edema, palpitations, dizziness or syncope.  Pt denies neurological change such as new headache, facial or extremity weakness.  Pt denies polydipsia, polyuria, or low sugar symptoms. Pt states overall good compliance with treatment and medications, good tolerability, and has been trying to follow lower cholesterol diet.  Pt denies worsening depressive symptoms, suicidal ideation or panic. No fever, night sweats, wt loss, loss of appetite, or other constitutional symptoms.  Pt states good ability with ADL's, has low fall risk, home safety reviewed and adequate, no other significant changes in hearing or vision,  Here with wife,pt with dementia/unable to help with hx;  Wife mentions aggrenox is expensive but willing to continue for now, as may become generic next month.   Past Medical History  Diagnosis Date  . Hypertension   . Erectile dysfunction   . Hyperlipidemia   . Anxiety   . Depression   . GERD (gastroesophageal reflux disease)   . Diverticula, colon   . Syncope     History  . Mitral regurgitation   . Peptic ulcer disease     with Duodenal and Antral ulcers  . H. pylori infection   . HYPERLIPIDEMIA 05/12/2008  . ANXIETY 05/12/2008  . DEPRESSION 05/12/2008  . HYPERTENSION 05/12/2008  . GERD 05/12/2008  . PEPTIC ULCER DISEASE 05/12/2008  . DIVERTICULOSIS, COLON 05/12/2008  . OVERACTIVE BLADDER 05/12/2008  . Fracture of knee region 1974    with residual weakness  . History of stroke 12/18/2011   Past Surgical History  Procedure Laterality Date  . Appendectomy    . Tee without cardioversion  12/19/2011    Procedure: TRANSESOPHAGEAL ECHOCARDIOGRAM (TEE);  Surgeon: [name], MD;  Location: [facility] ENDOSCOPY;  Service: Cardiovascular;  Laterality: N/A;    reports that he has never  smoked. He does not have any smokeless tobacco history on file. He reports that he does not drink alcohol or use illicit drugs. family history includes Brain cancer in his father; Dementia in his brother; Heart Problems in his mother; Parkinsonism in his brother. No Known Allergies Current Outpatient Prescriptions on File Prior to Visit  Medication Sig Dispense Refill  . dipyridamole-aspirin (AGGRENOX) 200-25 MG per 12 hr capsule Take 1 capsule by mouth 2 (two) times daily.  180 capsule  4  . lisinopril (PRINIVIL,ZESTRIL) 5 MG tablet TAKE ONE (1) TABLET BY MOUTH EVERY DAY  30 tablet  6   No current facility-administered medications on file prior to visit.   Review of Systems - wife affirms Constitutional: Negative for increased diaphoresis, other activity, appetite or other siginficant weight change  HENT: Negative for worsening hearing loss, ear pain, facial swelling, mouth sores and neck stiffness.   Eyes: Negative for other worsening pain, redness or visual disturbance.  Respiratory: Negative for shortness of breath and wheezing.   Cardiovascular: Negative for chest pain and palpitations.  Gastrointestinal: Negative for diarrhea, blood in stool, abdominal distention or other pain Genitourinary: Negative for hematuria, flank pain or change in urine volume.  Musculoskeletal: Negative for myalgias or other joint complaints.  Skin: Negative for color change and wound.  Neurological: Negative for syncope and numbness. other than noted Hematological: Negative for adenopathy. or other swelling Psychiatric/Behavioral: Negative for hallucinations, self-injury, decreased concentration or other worsening  agitation.      Objective:   Physical Exam BP 110/68  Pulse 88  Temp(Src) 97.9 F (36.6 C) (Oral)  Wt 209 lb 4 oz (94.915 kg)  SpO2 94% VS noted,  Constitutional: Pt is oriented to person, place, and time. Appears well-developed and well-nourished.  Head: Normocephalic and atraumatic.    Right Ear: External ear normal.  Left Ear: External ear normal.  Nose: Nose normal.  Mouth/Throat: Oropharynx is clear and moist.  Eyes: Conjunctivae and EOM are normal. Pupils are equal, round, and reactive to light.  Neck: Normal range of motion. Neck supple. No JVD present. No tracheal deviation present.  Cardiovascular: Normal rate, regular rhythm, normal heart sounds and intact distal pulses.   Pulmonary/Chest: Effort normal and breath sounds without rales or wheezing  Abdominal: Soft. Bowel sounds are normal. NT. No HSM  Musculoskeletal: Normal range of motion. Exhibits no edema.  Lymphadenopathy:  Has no cervical adenopathy.  Neurological: Pt is alert and oriented to person, but confused to place and time. Pt has normal reflexes. No cranial nerve deficit. Motor grossly intact Skin: Skin is warm and dry. No rash noted.  Psychiatric:  Has normal mood and affect. Behavior is normal.   Wt Readings from Last 3 Encounters:  12/30/13 209 lb 4 oz (94.915 kg)  12/28/13 207 lb 3.2 oz (93.985 kg)  09/07/13 201 lb (91.173 kg)       Assessment & Plan:

## 2013-12-30 NOTE — Patient Instructions (Addendum)
You had the flu shot today  Please take all new medication as prescribed- the low dose cholesterol medication  Please continue all other medications as before, except you would be ok to stay off the lisinopril 5 mg for 1 wk to see if the fatigue improves (but I think this very unlikely due to the low dose)  Please have the pharmacy call with any other refills you may need.  Please continue your efforts at being more active, low cholesterol diet, and weight control.  You are otherwise up to date with prevention measures today.  Please keep your appointments with your specialists as you may have planned  Please go to the LAB in the Basement (turn left off the elevator) for the tests to be done today  You will be contacted by phone if any changes need to be made immediately.  Otherwise, you will receive a letter about your results with an explanation, but please check with MyChart first.  Please remember to sign up for MyChart if you have not done so, as this will be important to you in the future with finding out test results, communicating by private email, and scheduling acute appointments online when needed.  Please return in 6 months, or sooner if needed, with Lab testing done 3-5 days before

## 2013-12-30 NOTE — Progress Notes (Signed)
Pre visit review using our clinic review tool, if applicable. No additional management support is needed unless otherwise documented below in the visit note. 

## 2013-12-31 LAB — URINALYSIS, ROUTINE W REFLEX MICROSCOPIC
Bilirubin Urine: NEGATIVE
Hgb urine dipstick: NEGATIVE
Ketones, ur: NEGATIVE
Leukocytes, UA: NEGATIVE
Nitrite: NEGATIVE
RBC / HPF: NONE SEEN (ref 0–?)
Specific Gravity, Urine: 1.01 (ref 1.000–1.030)
Total Protein, Urine: NEGATIVE
Urine Glucose: NEGATIVE
Urobilinogen, UA: 0.2 (ref 0.0–1.0)
WBC, UA: NONE SEEN (ref 0–?)
pH: 5.5 (ref 5.0–8.0)

## 2014-01-02 NOTE — Assessment & Plan Note (Signed)
stable overall by history and exam, recent data reviewed with pt, and pt to continue medical treatment as before,  to f/u any worsening symptoms or concerns Lab Results  Component Value Date   LDLCALC 106* 12/30/2013

## 2014-01-02 NOTE — Assessment & Plan Note (Signed)
stable overall by history and exam, recent data reviewed with pt, and pt to continue medical treatment as before,  to f/u any worsening symptoms or concerns BP Readings from Last 3 Encounters:  12/30/13 110/68  12/28/13 120/79  09/07/13 111/66

## 2014-01-02 NOTE — Assessment & Plan Note (Signed)

## 2014-01-18 ENCOUNTER — Ambulatory Visit (INDEPENDENT_AMBULATORY_CARE_PROVIDER_SITE_OTHER): Payer: Medicare Other | Admitting: *Deleted

## 2014-01-18 DIAGNOSIS — I6350 Cerebral infarction due to unspecified occlusion or stenosis of unspecified cerebral artery: Secondary | ICD-10-CM

## 2014-01-18 DIAGNOSIS — I639 Cerebral infarction, unspecified: Secondary | ICD-10-CM

## 2014-01-18 LAB — MDC_IDC_ENUM_SESS_TYPE_REMOTE
Date Time Interrogation Session: 20151008051124
Zone Setting Detection Interval: 2000 ms
Zone Setting Detection Interval: 3000 ms
Zone Setting Detection Interval: 380 ms

## 2014-01-21 ENCOUNTER — Encounter: Payer: Self-pay | Admitting: Internal Medicine

## 2014-01-24 ENCOUNTER — Encounter: Payer: Self-pay | Admitting: Internal Medicine

## 2014-01-25 ENCOUNTER — Encounter: Payer: Self-pay | Admitting: Internal Medicine

## 2014-02-04 NOTE — Progress Notes (Signed)
Loop recorder 

## 2014-02-15 ENCOUNTER — Encounter: Payer: Self-pay | Admitting: Internal Medicine

## 2014-02-18 ENCOUNTER — Ambulatory Visit (INDEPENDENT_AMBULATORY_CARE_PROVIDER_SITE_OTHER): Payer: Medicare Other | Admitting: *Deleted

## 2014-02-18 DIAGNOSIS — I639 Cerebral infarction, unspecified: Secondary | ICD-10-CM

## 2014-02-18 DIAGNOSIS — I6350 Cerebral infarction due to unspecified occlusion or stenosis of unspecified cerebral artery: Secondary | ICD-10-CM

## 2014-02-21 ENCOUNTER — Telehealth: Payer: Self-pay | Admitting: Internal Medicine

## 2014-02-21 NOTE — Telephone Encounter (Signed)
New message  Pt wife called received a No-Show via email.. She requests a call back to determine what they should do moving forward//sr

## 2014-02-22 NOTE — Telephone Encounter (Signed)
LMOVM for pt wife to return call.  

## 2014-02-23 NOTE — Telephone Encounter (Signed)
LMOVM for pt wife to return call. 2nd attempt.  

## 2014-02-23 NOTE — Progress Notes (Signed)
Loop recorder 

## 2014-02-24 NOTE — Telephone Encounter (Signed)
Informed pt wife that she is receiving that no show alerts b/c of our scheduling system and that there is no extra charges that she has to be worried about b/c all of these appt's are remotely checked by pt home monitor for his loop recorder. She verbalized understanding.

## 2014-03-07 ENCOUNTER — Ambulatory Visit: Payer: Medicare Other | Admitting: *Deleted

## 2014-03-07 DIAGNOSIS — I639 Cerebral infarction, unspecified: Secondary | ICD-10-CM

## 2014-03-14 ENCOUNTER — Other Ambulatory Visit: Payer: Self-pay | Admitting: Internal Medicine

## 2014-03-14 ENCOUNTER — Other Ambulatory Visit: Payer: Self-pay

## 2014-03-14 MED ORDER — LISINOPRIL 5 MG PO TABS: ORAL_TABLET | ORAL | Status: DC

## 2014-03-17 ENCOUNTER — Encounter (HOSPITAL_COMMUNITY): Payer: Self-pay | Admitting: Internal Medicine

## 2014-03-17 LAB — MDC_IDC_ENUM_SESS_TYPE_REMOTE

## 2014-03-23 NOTE — Progress Notes (Signed)
Loop recorder 

## 2014-04-07 ENCOUNTER — Ambulatory Visit: Payer: Medicare Other | Admitting: *Deleted

## 2014-04-07 ENCOUNTER — Telehealth: Payer: Self-pay | Admitting: Cardiology

## 2014-04-07 ENCOUNTER — Ambulatory Visit (INDEPENDENT_AMBULATORY_CARE_PROVIDER_SITE_OTHER): Payer: Medicare Other | Admitting: *Deleted

## 2014-04-07 DIAGNOSIS — I639 Cerebral infarction, unspecified: Secondary | ICD-10-CM

## 2014-04-07 DIAGNOSIS — I6350 Cerebral infarction due to unspecified occlusion or stenosis of unspecified cerebral artery: Secondary | ICD-10-CM

## 2014-04-07 NOTE — Telephone Encounter (Signed)
Spoke w/ pt and informed him that we have not received transmission since 03-21-14 and we need him to send manual transmission. Pt verbalized understanding.

## 2014-04-18 ENCOUNTER — Encounter: Payer: Self-pay | Admitting: Internal Medicine

## 2014-04-22 NOTE — Progress Notes (Signed)
Loop recorder 

## 2014-05-05 ENCOUNTER — Encounter: Payer: Self-pay | Admitting: Internal Medicine

## 2014-05-06 ENCOUNTER — Encounter: Payer: Self-pay | Admitting: Internal Medicine

## 2014-05-10 LAB — MDC_IDC_ENUM_SESS_TYPE_REMOTE
Date Time Interrogation Session: 20151231171704
Zone Setting Detection Interval: 2000 ms
Zone Setting Detection Interval: 3000 ms
Zone Setting Detection Interval: 380 ms

## 2014-05-20 ENCOUNTER — Ambulatory Visit (INDEPENDENT_AMBULATORY_CARE_PROVIDER_SITE_OTHER): Payer: Medicare Other | Admitting: *Deleted

## 2014-05-20 DIAGNOSIS — I639 Cerebral infarction, unspecified: Secondary | ICD-10-CM

## 2014-05-20 DIAGNOSIS — I6350 Cerebral infarction due to unspecified occlusion or stenosis of unspecified cerebral artery: Secondary | ICD-10-CM

## 2014-05-20 LAB — MDC_IDC_ENUM_SESS_TYPE_REMOTE
Date Time Interrogation Session: 20160216221712
Zone Setting Detection Interval: 2000 ms
Zone Setting Detection Interval: 3000 ms
Zone Setting Detection Interval: 380 ms

## 2014-05-24 ENCOUNTER — Telehealth: Payer: Self-pay | Admitting: Cardiology

## 2014-05-24 NOTE — Telephone Encounter (Signed)
Spoke w/ pt and he will send manual transmission.

## 2014-05-25 NOTE — Progress Notes (Signed)
Loop recorder 

## 2014-06-10 ENCOUNTER — Encounter: Payer: Self-pay | Admitting: Cardiology

## 2014-06-17 ENCOUNTER — Ambulatory Visit (INDEPENDENT_AMBULATORY_CARE_PROVIDER_SITE_OTHER): Payer: Medicare Other | Admitting: *Deleted

## 2014-06-17 DIAGNOSIS — I6350 Cerebral infarction due to unspecified occlusion or stenosis of unspecified cerebral artery: Secondary | ICD-10-CM

## 2014-06-17 DIAGNOSIS — I639 Cerebral infarction, unspecified: Secondary | ICD-10-CM

## 2014-06-21 ENCOUNTER — Encounter: Payer: Self-pay | Admitting: Internal Medicine

## 2014-06-23 NOTE — Progress Notes (Signed)
Loop recorder 

## 2014-06-28 ENCOUNTER — Other Ambulatory Visit: Payer: Medicare Other

## 2014-06-30 ENCOUNTER — Ambulatory Visit (INDEPENDENT_AMBULATORY_CARE_PROVIDER_SITE_OTHER): Payer: Medicare Other | Admitting: Internal Medicine

## 2014-06-30 ENCOUNTER — Encounter: Payer: Self-pay | Admitting: Internal Medicine

## 2014-06-30 ENCOUNTER — Telehealth: Payer: Self-pay | Admitting: Internal Medicine

## 2014-06-30 VITALS — BP 122/72 | HR 60 | Temp 97.6°F | Wt 209.0 lb

## 2014-06-30 DIAGNOSIS — I672 Cerebral atherosclerosis: Secondary | ICD-10-CM | POA: Diagnosis not present

## 2014-06-30 DIAGNOSIS — Z0189 Encounter for other specified special examinations: Secondary | ICD-10-CM

## 2014-06-30 DIAGNOSIS — Z8673 Personal history of transient ischemic attack (TIA), and cerebral infarction without residual deficits: Secondary | ICD-10-CM

## 2014-06-30 DIAGNOSIS — Z0000 Encounter for general adult medical examination without abnormal findings: Secondary | ICD-10-CM

## 2014-06-30 DIAGNOSIS — I10 Essential (primary) hypertension: Secondary | ICD-10-CM | POA: Diagnosis not present

## 2014-06-30 DIAGNOSIS — E785 Hyperlipidemia, unspecified: Secondary | ICD-10-CM | POA: Diagnosis not present

## 2014-06-30 NOTE — Assessment & Plan Note (Signed)
stable overall by history and exam, recent data reviewed with pt, and pt to continue medical treatment as before,  to f/u any worsening symptoms or concerns BP Readings from Last 3 Encounters:  06/30/14 122/72  12/30/13 110/68  12/28/13 120/79

## 2014-06-30 NOTE — Assessment & Plan Note (Signed)
stable overall by history and exam, recent data reviewed with pt, and pt to continue medical treatment as before,  to f/u any worsening symptoms or concerns Lab Results  Component Value Date   LDLCALC 106* 12/30/2013   Declines med change for now, to cont work on diet, goal ldl < 70

## 2014-06-30 NOTE — Patient Instructions (Signed)
Please continue all other medications as before, and refills have been done if requested.  Please have the pharmacy call with any other refills you may need.  Please continue your efforts at being more active, low cholesterol diet, and weight control.  You are otherwise up to date with prevention measures today.  Please keep your appointments with your specialists as you may have planned  Please return in 6 months, or sooner if needed, with Lab testing done 3-5 days before  

## 2014-06-30 NOTE — Assessment & Plan Note (Signed)
stable overall by history and exam, recent data reviewed with pt, and pt to continue medical treatment as before,  to f/u any worsening symptoms or concerns Lab Results  Component Value Date   WBC 6.2 12/30/2013   HGB 15.4 12/30/2013   HCT 46.4 12/30/2013   PLT 153.0 12/30/2013   GLUCOSE 92 12/30/2013   CHOL 160 12/30/2013   TRIG 94.0 12/30/2013   HDL 35.60* 12/30/2013   LDLCALC 106* 12/30/2013   ALT 19 12/30/2013   AST 23 12/30/2013   NA 138 12/30/2013   K 4.6 12/30/2013   CL 102 12/30/2013   CREATININE 1.2 12/30/2013   BUN 21 12/30/2013   CO2 27 12/30/2013   TSH 1.47 12/30/2013   PSA 0.57 12/30/2013   INR 1.02 01/04/2012   HGBA1C 5.4 12/17/2011

## 2014-06-30 NOTE — Progress Notes (Signed)
Subjective:    Patient ID: Jeffrey Hardy, male    DOB: October 31, 1935, 80 y.o.   MRN: [id number]  HPI  Here to f/u; overall doing ok,  Pt denies chest pain, increasing sob or doe, wheezing, orthopnea, PND, increased LE swelling, palpitations, dizziness or syncope.  Pt denies new neurological symptoms such as new headache, or facial or extremity weakness or numbness.  Pt denies polydipsia, polyuria, or low sugar episode.   Pt denies new neurological symptoms such as new headache, or facial or extremity weakness or numbness.   Pt states overall good compliance with meds, mostly trying to follow appropriate diet, with wt overall stable,  but little exercise however, plans to try to do better, maybe even get a treadmill for the basement.  Still has implant loop recorder now almost 2 yrs Past Medical History  Diagnosis Date  . Hypertension   . Erectile dysfunction   . Hyperlipidemia   . Anxiety   . Depression   . GERD (gastroesophageal reflux disease)   . Diverticula, colon   . Syncope     History  . Mitral regurgitation   . Peptic ulcer disease     with Duodenal and Antral ulcers  . H. pylori infection   . HYPERLIPIDEMIA 05/12/2008  . ANXIETY 05/12/2008  . DEPRESSION 05/12/2008  . HYPERTENSION 05/12/2008  . GERD 05/12/2008  . PEPTIC ULCER DISEASE 05/12/2008  . DIVERTICULOSIS, COLON 05/12/2008  . OVERACTIVE BLADDER 05/12/2008  . Fracture of knee region 1974    with residual weakness  . History of stroke 12/18/2011   Past Surgical History  Procedure Laterality Date  . Appendectomy    . Tee without cardioversion  12/19/2011    Procedure: TRANSESOPHAGEAL ECHOCARDIOGRAM (TEE);  Surgeon: [name], MD;  Location: [facility] ENDOSCOPY;  Service: Cardiovascular;  Laterality: N/A;  . Loop recorder implant N/A 07/06/2012    Procedure: LOOP RECORDER IMPLANT;  Surgeon: [name], MD;  Location: [facility] CATH LAB;  Service: Cardiovascular;  Laterality: N/A;    reports that he has never smoked. He does not have any  smokeless tobacco history on file. He reports that he does not drink alcohol or use illicit drugs. family history includes Brain cancer in his father; Dementia in his brother; Heart Problems in his mother; Parkinsonism in his brother. No Known Allergies Current Outpatient Prescriptions on File Prior to Visit  Medication Sig Dispense Refill  . dipyridamole-aspirin (AGGRENOX) 200-25 MG per 12 hr capsule Take 1 capsule by mouth 2 (two) times daily. 180 capsule 4  . lisinopril (PRINIVIL,ZESTRIL) 5 MG tablet TAKE ONE (1) TABLET EACH DAY 30 tablet 5  . pravastatin (PRAVACHOL) 20 MG tablet Take 1 tablet (20 mg total) by mouth daily. 90 tablet 3   No current facility-administered medications on file prior to visit.   Review of Systems  Constitutional: Negative for unusual diaphoresis or night sweats HENT: Negative for ringing in ear or discharge Eyes: Negative for double vision or worsening visual disturbance.  Respiratory: Negative for choking and stridor.   Gastrointestinal: Negative for vomiting or other signifcant bowel change Genitourinary: Negative for hematuria or change in urine volume.  Musculoskeletal: Negative for other MSK pain or swelling Skin: Negative for color change and worsening wound.  Neurological: Negative for tremors and numbness other than noted  Psychiatric/Behavioral: Negative for decreased concentration or agitation other than above       Objective:   Physical Exam BP 122/72 mmHg  Pulse 60  Temp(Src) 97.6 F (36.4 C)  Wt 209 lb 0.6 oz (94.82 kg)  SpO2 98% VS noted,  Constitutional: Pt appears in no significant distress HENT: Head: NCAT.  Right Ear: External ear normal.  Left Ear: External ear normal.  Eyes: . Pupils are equal, round, and reactive to light. Conjunctivae and EOM are normal Neck: Normal range of motion. Neck supple.  Cardiovascular: Normal rate and regular rhythm.   Pulmonary/Chest: Effort normal and breath sounds without rales or wheezing.    Abd:  Soft, NT, ND, + BS Neurological: Pt is alert. Not confused , motor grossly intact Skin: Skin is warm. No rash, no LE edema Psychiatric: Pt behavior is normal. No agitation.     Assessment & Plan:

## 2014-06-30 NOTE — Progress Notes (Signed)
Pre visit review using our clinic review tool, if applicable. No additional management support is needed unless otherwise documented below in the visit note. 

## 2014-06-30 NOTE — Telephone Encounter (Signed)
Patient would like labs entered prior to next appt.

## 2014-07-04 ENCOUNTER — Telehealth: Payer: Self-pay | Admitting: Internal Medicine

## 2014-07-04 NOTE — Telephone Encounter (Signed)
emmi emailed °

## 2014-07-05 ENCOUNTER — Encounter: Payer: Self-pay | Admitting: Internal Medicine

## 2014-07-05 LAB — MDC_IDC_ENUM_SESS_TYPE_REMOTE: Date Time Interrogation Session: 20160311050500

## 2014-07-12 ENCOUNTER — Encounter: Payer: Self-pay | Admitting: Internal Medicine

## 2014-07-13 ENCOUNTER — Encounter: Payer: Self-pay | Admitting: Internal Medicine

## 2014-07-18 ENCOUNTER — Ambulatory Visit (INDEPENDENT_AMBULATORY_CARE_PROVIDER_SITE_OTHER): Payer: Medicare Other | Admitting: *Deleted

## 2014-07-18 DIAGNOSIS — I6350 Cerebral infarction due to unspecified occlusion or stenosis of unspecified cerebral artery: Secondary | ICD-10-CM

## 2014-07-18 DIAGNOSIS — I639 Cerebral infarction, unspecified: Secondary | ICD-10-CM

## 2014-07-21 NOTE — Progress Notes (Signed)
Loop recorder 

## 2014-07-28 ENCOUNTER — Encounter: Payer: Self-pay | Admitting: Cardiology

## 2014-08-17 ENCOUNTER — Ambulatory Visit (INDEPENDENT_AMBULATORY_CARE_PROVIDER_SITE_OTHER): Payer: Medicare Other | Admitting: *Deleted

## 2014-08-17 DIAGNOSIS — I639 Cerebral infarction, unspecified: Secondary | ICD-10-CM

## 2014-08-17 DIAGNOSIS — I6350 Cerebral infarction due to unspecified occlusion or stenosis of unspecified cerebral artery: Secondary | ICD-10-CM

## 2014-08-19 NOTE — Progress Notes (Signed)
Loop recorder 

## 2014-08-22 LAB — CUP PACEART REMOTE DEVICE CHECK
Date Time Interrogation Session: 20160427161413
Zone Setting Detection Interval: 2000 ms
Zone Setting Detection Interval: 3000 ms
Zone Setting Detection Interval: 380 ms

## 2014-08-25 ENCOUNTER — Encounter: Payer: Self-pay | Admitting: Cardiology

## 2014-09-01 ENCOUNTER — Encounter: Payer: Self-pay | Admitting: Internal Medicine

## 2014-09-07 ENCOUNTER — Encounter: Payer: Self-pay | Admitting: Internal Medicine

## 2014-09-07 ENCOUNTER — Ambulatory Visit (INDEPENDENT_AMBULATORY_CARE_PROVIDER_SITE_OTHER): Payer: Medicare Other | Admitting: Internal Medicine

## 2014-09-07 VITALS — BP 110/60 | HR 60 | Ht 71.0 in | Wt 209.2 lb

## 2014-09-07 DIAGNOSIS — K219 Gastro-esophageal reflux disease without esophagitis: Secondary | ICD-10-CM | POA: Diagnosis not present

## 2014-09-07 DIAGNOSIS — R1310 Dysphagia, unspecified: Secondary | ICD-10-CM | POA: Diagnosis not present

## 2014-09-07 LAB — CUP PACEART REMOTE DEVICE CHECK
Date Time Interrogation Session: 20160504235630
Zone Setting Detection Interval: 2000 ms
Zone Setting Detection Interval: 3000 ms
Zone Setting Detection Interval: 380 ms

## 2014-09-07 NOTE — Progress Notes (Signed)
HISTORY OF PRESENT ILLNESS:  Jeffrey Hardy is a 79 y.o. male who is self-referred and accompanied by his wife regarding recent problems with nausea, vomiting, diarrhea, and hiccups. I last saw the patient 07/05/2005 when he underwent routine screening colonoscopy. This was normal including intubation of the terminal ileum except for mild sigmoid diverticulosis. Follow-up in 10 years recommended. Patient reports that he was in his usual state of good health until 07/10/2014 when he developed the abrupt onset of nausea with vomiting and diarrhea. The problem was abrupt, associated with fever, and lasted 12 hours. Thereafter problems with hiccups for about 3 days. He has had similar problems rarely remotely. No problems over the past 6 weeks. He was advised to him by his daughter that "he should have this checked out". Review of systems reveals mild occasional acid reflux with belching. Also, intermittent solid food dysphagia for some time. No weight loss. No abdominal pain or lower GI complaints. He is aware that he is due for repeat screening colonoscopy next year. He did have a stroke a few years back, but has done well since. On Aggrenox.  REVIEW OF SYSTEMS:  All non-GI ROS negative except for anxiety, depression, urinary frequency  Past Medical History  Diagnosis Date  . Hypertension   . Erectile dysfunction   . Hyperlipidemia   . Anxiety   . Depression   . GERD (gastroesophageal reflux disease)   . Diverticula, colon   . Syncope     History  . Mitral regurgitation   . Peptic ulcer disease     with Duodenal and Antral ulcers  . H. pylori infection   . HYPERLIPIDEMIA 05/12/2008  . ANXIETY 05/12/2008  . DEPRESSION 05/12/2008  . HYPERTENSION 05/12/2008  . GERD 05/12/2008  . PEPTIC ULCER DISEASE 05/12/2008  . DIVERTICULOSIS, COLON 05/12/2008  . OVERACTIVE BLADDER 05/12/2008  . Fracture of knee region 1974    with residual weakness  . History of stroke 12/18/2011    Past Surgical History  Procedure  Laterality Date  . Appendectomy    . Tee without cardioversion  12/19/2011    Procedure: TRANSESOPHAGEAL ECHOCARDIOGRAM (TEE);  Surgeon: [name], MD;  Location: [facility] ENDOSCOPY;  Service: Cardiovascular;  Laterality: N/A;  . Loop recorder implant N/A 07/06/2012    Procedure: LOOP RECORDER IMPLANT;  Surgeon: [name], MD;  Location: [facility] CATH LAB;  Service: Cardiovascular;  Laterality: N/A;    Social History [name]  reports that he quit smoking about 48 years ago. His smoking use included Cigarettes and Pipe. He has never used smokeless tobacco. He reports that he does not drink alcohol or use illicit drugs.  family history includes Brain cancer in his father; Dementia in his brother; Heart Problems in his mother; Parkinsonism in his brother.  No Known Allergies     PHYSICAL EXAMINATION: Vital signs: BP 110/60 mmHg  Pulse 60  Ht 5\' 11"  (1.803 m)  Wt 209 lb 4 oz (94.915 kg)  BMI 29.20 kg/m2 General: Well-developed, well-nourished, no acute distress HEENT: Sclerae are anicteric, conjunctiva pink. Oral mucosa intact Lungs: Clear Heart: Regular Abdomen: soft, nontender, nondistended, no obvious ascites, no peritoneal signs, normal bowel sounds. No organomegaly. Extremities: No edema Psychiatric: alert and oriented x3. Cooperative     ASSESSMENT:  #1. Acute gastrointestinal viral illness. Resolved #2. Transient hiccups. Resolved #3. GERD. Mild by history #4. Intermed solid food dysphagia. Rule out stricture   PLAN:  #1. Reflux precautions  #2. Upper endoscopy with possible esophageal dilation.The  nature of the procedure, as well as the risks, benefits, and alternatives were carefully and thoroughly reviewed with the patient. Ample time for discussion and questions allowed. The patient understood, was satisfied, and agreed to proceed. #3. The need for acid suppressive medication to be determined #4. Due for repeat screening colonoscopy around March 2017

## 2014-09-07 NOTE — Patient Instructions (Signed)
You have been scheduled for an endoscopy. Please follow written instructions given to you at your visit today. If you use inhalers (even only as needed), please bring them with you on the day of your procedure.   

## 2014-09-14 ENCOUNTER — Encounter: Payer: Self-pay | Admitting: Internal Medicine

## 2014-09-16 ENCOUNTER — Encounter: Payer: Self-pay | Admitting: Internal Medicine

## 2014-09-16 ENCOUNTER — Ambulatory Visit (AMBULATORY_SURGERY_CENTER): Payer: Medicare Other | Admitting: Internal Medicine

## 2014-09-16 ENCOUNTER — Ambulatory Visit (INDEPENDENT_AMBULATORY_CARE_PROVIDER_SITE_OTHER): Payer: Medicare Other | Admitting: *Deleted

## 2014-09-16 VITALS — BP 130/64 | HR 65 | Temp 96.5°F | Resp 34 | Ht 71.0 in | Wt 209.0 lb

## 2014-09-16 DIAGNOSIS — I6350 Cerebral infarction due to unspecified occlusion or stenosis of unspecified cerebral artery: Secondary | ICD-10-CM | POA: Diagnosis not present

## 2014-09-16 DIAGNOSIS — K2100 Gastro-esophageal reflux disease with esophagitis, without bleeding: Secondary | ICD-10-CM

## 2014-09-16 DIAGNOSIS — I639 Cerebral infarction, unspecified: Secondary | ICD-10-CM

## 2014-09-16 DIAGNOSIS — R1310 Dysphagia, unspecified: Secondary | ICD-10-CM

## 2014-09-16 DIAGNOSIS — K219 Gastro-esophageal reflux disease without esophagitis: Secondary | ICD-10-CM

## 2014-09-16 MED ORDER — OMEPRAZOLE 20 MG PO CPDR: 20.0000 mg | DELAYED_RELEASE_CAPSULE | Freq: Every day | ORAL | Status: DC

## 2014-09-16 MED ORDER — SODIUM CHLORIDE 0.9 % IV SOLN: 500.0000 mL | INTRAVENOUS | Status: DC

## 2014-09-16 NOTE — Op Note (Signed)
[facility]  [employer] [address] [place], [address]   ENDOSCOPY PROCEDURE REPORT  PATIENT: Jeffrey, Hardy  MR#: [id number] BIRTHDATE: 09/20/1935 , 78  yrs. old GENDER: male ENDOSCOPIST: Eustace Quail, MD REFERRED BY:  .  Self / Office PROCEDURE DATE:  09/16/2014 PROCEDURE:  EGD, diagnostic ASA CLASS:     Class III INDICATIONS:  dysphagia and history of esophageal reflux. MEDICATIONS: Monitored anesthesia care and Propofol 200 mg IV TOPICAL ANESTHETIC: none  DESCRIPTION OF PROCEDURE: After the risks benefits and alternatives of the procedure were thoroughly explained, informed consent was obtained.  The LB [id number] endoscope was introduced through the mouth and advanced to the second portion of the duodenum , Without limitations.  The instrument was slowly withdrawn as the mucosa was fully examined.   EXAM:The esophagus revealed distal esophagitis as manifested by erythema, friability, and local erosions.  Early stricture formation as well.  No Barrett's.  The stomach was normal.  The duodenum was normal.  Retroflexed views revealed a hiatal hernia. The scope was then withdrawn from the patient and the procedure completed.  COMPLICATIONS: There were no immediate complications.  ENDOSCOPIC IMPRESSION: 1. GERD with esophagitis and early stricture   . No dilation due to active esophagitis  RECOMMENDATIONS: 1.  Anti-reflux regimen to be followed 2.  Prescribe omeprazole 20 mg daily; #30; 11 refills 3.  Call office next 2-3 days to schedule an office appointment with Dr. Henrene Pastor in about 8 weeks.  REPEAT EXAM:  eSigned:  Eustace Quail, MD 09/16/2014 8:30 AM    CC:The Patient and Biagio Borg, MD

## 2014-09-16 NOTE — Patient Instructions (Addendum)
YOU HAD AN ENDOSCOPIC PROCEDURE TODAY AT [facility]:   Refer to the procedure report that was given to you for any specific questions about what was found during the examination.  If the procedure report does not answer your questions, please call your gastroenterologist to clarify.  If you requested that your care partner not be given the details of your procedure findings, then the procedure report has been included in a sealed envelope for you to review at your convenience later.  YOU SHOULD EXPECT: Some feelings of bloating in the abdomen. Passage of more gas than usual.  Walking can help get rid of the air that was put into your GI tract during the procedure and reduce the bloating. If you had a lower endoscopy (such as a colonoscopy or flexible sigmoidoscopy) you may notice spotting of blood in your stool or on the toilet paper. If you underwent a bowel prep for your procedure, you may not have a normal bowel movement for a few days.  Please Note:  You might notice some irritation and congestion in your nose or some drainage.  This is from the oxygen used during your procedure.  There is no need for concern and it should clear up in a day or so.  SYMPTOMS TO REPORT IMMEDIATELY:   Following upper endoscopy (EGD)  Vomiting of blood or coffee ground material  New chest pain or pain under the shoulder blades  Painful or persistently difficult swallowing  New shortness of breath  Fever of 100F or higher  Black, tarry-looking stools  For urgent or emergent issues, a gastroenterologist can be reached at any hour by calling [phone].   DIET: Your first meal following the procedure should be a small meal and then it is ok to progress to your normal diet. Heavy or fried foods are harder to digest and may make you feel nauseous or bloated.  Likewise, meals heavy in dairy and vegetables can increase bloating.  Drink plenty of fluids but you should avoid alcoholic beverages for  24 hours.  ACTIVITY:  You should plan to take it easy for the rest of today and you should NOT DRIVE or use heavy machinery until tomorrow (because of the sedation medicines used during the test).    FOLLOW UP: Our staff will call the number listed on your records the next business day following your procedure to check on you and address any questions or concerns that you may have regarding the information given to you following your procedure. If we do not reach you, we will leave a message.  However, if you are feeling well and you are not experiencing any problems, there is no need to return our call.  We will assume that you have returned to your regular daily activities without incident.  If any biopsies were taken you will be contacted by phone or by letter within the next 1-3 weeks.  Please call us at [phone] if you have not heard about the biopsies in 3 weeks.    SIGNATURES/CONFIDENTIALITY: You and/or your care partner have signed paperwork which will be entered into your electronic medical record.  These signatures attest to the fact that that the information above on your After Visit Summary has been reviewed and is understood.  Full responsibility of the confidentiality of this discharge information lies with you and/or your care-partner.  Handout was given to your care partner on GERD and a hiatal hernia. You may resume your current medications today.  Rx was sent to [facility] for omeprazole 20 mg take one daily, 20-30 minutes prior to breakfast on an empty stomach. Please call Dr. Blanch Media office next 2-3 days to schedule an office visit with Dr. Henrene Pastor in 8 weeks. Please call if any questions or concerns.

## 2014-09-16 NOTE — Progress Notes (Signed)
To recovery, report to Willis, RN, VSS 

## 2014-09-19 ENCOUNTER — Telehealth: Payer: Self-pay | Admitting: Emergency Medicine

## 2014-09-19 NOTE — Telephone Encounter (Signed)
No identifier, left message f/u 

## 2014-09-19 NOTE — Progress Notes (Signed)
Loop recorder 

## 2014-09-20 LAB — CUP PACEART REMOTE DEVICE CHECK
Date Time Interrogation Session: 20160531151721
Zone Setting Detection Interval: 2000 ms
Zone Setting Detection Interval: 3000 ms
Zone Setting Detection Interval: 380 ms

## 2014-09-21 ENCOUNTER — Encounter: Payer: Self-pay | Admitting: Cardiology

## 2014-09-28 ENCOUNTER — Encounter: Payer: Self-pay | Admitting: Cardiology

## 2014-10-07 ENCOUNTER — Encounter: Payer: Self-pay | Admitting: *Deleted

## 2014-10-17 ENCOUNTER — Ambulatory Visit (INDEPENDENT_AMBULATORY_CARE_PROVIDER_SITE_OTHER): Payer: Medicare Other | Admitting: *Deleted

## 2014-10-17 DIAGNOSIS — I6350 Cerebral infarction due to unspecified occlusion or stenosis of unspecified cerebral artery: Secondary | ICD-10-CM

## 2014-10-17 DIAGNOSIS — I639 Cerebral infarction, unspecified: Secondary | ICD-10-CM

## 2014-10-18 NOTE — Progress Notes (Signed)
Loop recorder 

## 2014-10-25 ENCOUNTER — Encounter: Payer: Self-pay | Admitting: Internal Medicine

## 2014-10-26 ENCOUNTER — Ambulatory Visit (INDEPENDENT_AMBULATORY_CARE_PROVIDER_SITE_OTHER): Payer: Medicare Other | Admitting: Internal Medicine

## 2014-10-26 ENCOUNTER — Encounter: Payer: Self-pay | Admitting: Internal Medicine

## 2014-10-26 DIAGNOSIS — I639 Cerebral infarction, unspecified: Secondary | ICD-10-CM | POA: Diagnosis not present

## 2014-10-26 DIAGNOSIS — Z4509 Encounter for adjustment and management of other cardiac device: Secondary | ICD-10-CM | POA: Diagnosis not present

## 2014-10-26 NOTE — Progress Notes (Signed)
      Patient Care Team: Biagio Borg, MD as PCP - General   HPI  Jeffrey Hardy is a 79 y.o. male Seen in followup for a cryptogenic stroke for which he underwent loop recorder insertion 3/14   He has recovered fully.  The patient denies chest pain, shortness of breath, nocturnal dyspnea, orthopnea or peripheral edema.  There have been no palpitations, lightheadedness or syncope.    Echo 2013 demonstrated normal left ventricular function and normal left atrial dimensions (30/1.3)  Past Medical History  Diagnosis Date  . Hypertension   . Erectile dysfunction   . Hyperlipidemia   . Anxiety   . Depression   . GERD (gastroesophageal reflux disease)   . Diverticula, colon   . Syncope     History  . Mitral regurgitation   . Peptic ulcer disease     with Duodenal and Antral ulcers  . H. pylori infection   . HYPERLIPIDEMIA 05/12/2008  . ANXIETY 05/12/2008  . DEPRESSION 05/12/2008  . HYPERTENSION 05/12/2008  . GERD 05/12/2008  . PEPTIC ULCER DISEASE 05/12/2008  . DIVERTICULOSIS, COLON 05/12/2008  . OVERACTIVE BLADDER 05/12/2008  . Fracture of knee region 1974    with residual weakness  . History of stroke 12/18/2011    Past Surgical History  Procedure Laterality Date  . Appendectomy    . Tee without cardioversion  12/19/2011    Procedure: TRANSESOPHAGEAL ECHOCARDIOGRAM (TEE);  Surgeon: [name], MD;  Location: [facility] ENDOSCOPY;  Service: Cardiovascular;  Laterality: N/A;  . Loop recorder implant N/A 07/06/2012    Procedure: LOOP RECORDER IMPLANT;  Surgeon: [name], MD;  Location: [facility] CATH LAB;  Service: Cardiovascular;  Laterality: N/A;    Current Outpatient Prescriptions  Medication Sig Dispense Refill  . Cholecalciferol 1000 UNITS capsule Take 2,000 Units by mouth daily.    [name] dipyridamole-aspirin (AGGRENOX) 200-25 MG per 12 hr capsule Take 1 capsule by mouth 2 (two) times daily. 180 capsule 4  . lisinopril (PRINIVIL,ZESTRIL) 5 MG tablet TAKE ONE (1) TABLET EACH DAY 30 tablet  5  . omeprazole (PRILOSEC) 20 MG capsule Take 1 capsule (20 mg total) by mouth daily. Take one pill on an empty stomach, 20 - 30 minutes prior to breakfast. 30 capsule 11  . tamsulosin (FLOMAX) 0.4 MG CAPS capsule Take 0.4 mg by mouth daily after breakfast.     No current facility-administered medications for this visit.    No Known Allergies  Review of Systems negative except from HPI and PMH  Physical Exam There were no vitals taken for this visit. Well developed and well nourished in no acute distress HENT normal E scleral and icterus clear Neck Supple JVP flat; carotids brisk and full Clear to ausculation Device pocket well healed; without hematoma or erythema.  There is no tethering Regular rate and rhythm, no murmurs gallops or rub Soft with active bowel sounds No clubbing cyanosis  Edema Alert and oriented, grossly normal motor and sensory function Skin Warm and Dry    Loop recorder interrogation demonstrates  no atrial arrhythmias     Assessment and  Plan  Cryptogenic stroke  Supraventricular tachycardia-probable atrial tachycardia  Ventricular tachycardia-nonsustained  Implantable loop recorder-LINQ   No intercurrent episodes of atrial fibrillation. Continue current medications

## 2014-10-26 NOTE — Patient Instructions (Signed)
Medication Instructions:  Your physician recommends that you continue on your current medications as directed. Please refer to the Current Medication list given to you today.  Labwork: None ordered  Testing/Procedures: None ordered  Follow-Up: Your physician wants you to follow-up in: 1 year with Dr. Klein.  You will receive a reminder letter in the mail two months in advance. If you don't receive a letter, please call our office to schedule the follow-up appointment.  Any Other Special Instructions Will Be Listed Below (If Applicable). Thank you for choosing [facility]!!        

## 2014-10-28 LAB — CUP PACEART INCLINIC DEVICE CHECK
Date Time Interrogation Session: 20160720204232
Zone Setting Detection Interval: 2000 ms
Zone Setting Detection Interval: 3000 ms
Zone Setting Detection Interval: 380 ms

## 2014-10-31 ENCOUNTER — Encounter: Payer: Self-pay | Admitting: Internal Medicine

## 2014-11-04 ENCOUNTER — Other Ambulatory Visit: Payer: Self-pay | Admitting: Internal Medicine

## 2014-11-11 ENCOUNTER — Encounter: Payer: Self-pay | Admitting: Internal Medicine

## 2014-11-11 ENCOUNTER — Ambulatory Visit (INDEPENDENT_AMBULATORY_CARE_PROVIDER_SITE_OTHER): Payer: Medicare Other | Admitting: Internal Medicine

## 2014-11-11 VITALS — BP 118/64 | HR 72 | Ht 71.0 in | Wt 207.2 lb

## 2014-11-11 DIAGNOSIS — R066 Hiccough: Secondary | ICD-10-CM

## 2014-11-11 DIAGNOSIS — R1310 Dysphagia, unspecified: Secondary | ICD-10-CM | POA: Diagnosis not present

## 2014-11-11 DIAGNOSIS — K2100 Gastro-esophageal reflux disease with esophagitis, without bleeding: Secondary | ICD-10-CM

## 2014-11-11 DIAGNOSIS — K222 Esophageal obstruction: Secondary | ICD-10-CM | POA: Diagnosis not present

## 2014-11-11 MED ORDER — OMEPRAZOLE 20 MG PO CPDR: 20.0000 mg | DELAYED_RELEASE_CAPSULE | Freq: Every day | ORAL | Status: DC

## 2014-11-11 NOTE — Patient Instructions (Signed)
We have sent the following medications to your pharmacy for you to pick up at your convenience:  Omeprazole.  Please follow up in one year  

## 2014-11-11 NOTE — Progress Notes (Signed)
HISTORY OF PRESENT ILLNESS:  Jeffrey Hardy is a 79 y.o. male who was evaluated 09/07/2014 regarding problems with nausea, vomiting, diarrhea, and hiccups. He also reported mild intermittent solid food dysphagia. He was felt to had a transient acute gastrointestinal virus related illness which had resolved. Also felt to have GERD. Upper endoscopy was performed 09/16/2014. He was found to have esophagitis as well as early stricture. He was prescribed omeprazole 20 mg daily. He presents for follow-up at this time. He is accompanied by his wife. The patient has been compliant with medical therapy. No side effects. He denies any reflux symptoms or problems with dysphagia. No issues with hiccups. They have multiple questions.  REVIEW OF SYSTEMS:  All non-GI ROS negative except for anxiety  Past Medical History  Diagnosis Date  . Hypertension   . Erectile dysfunction   . Hyperlipidemia   . Anxiety   . Depression   . GERD (gastroesophageal reflux disease)   . Diverticula, colon   . Syncope     History  . Mitral regurgitation   . Peptic ulcer disease     with Duodenal and Antral ulcers  . H. pylori infection   . HYPERLIPIDEMIA 05/12/2008  . ANXIETY 05/12/2008  . DEPRESSION 05/12/2008  . HYPERTENSION 05/12/2008  . GERD 05/12/2008  . PEPTIC ULCER DISEASE 05/12/2008  . DIVERTICULOSIS, COLON 05/12/2008  . OVERACTIVE BLADDER 05/12/2008  . Fracture of knee region 1974    with residual weakness  . History of stroke 12/18/2011    Past Surgical History  Procedure Laterality Date  . Appendectomy    . Tee without cardioversion  12/19/2011    Procedure: TRANSESOPHAGEAL ECHOCARDIOGRAM (TEE);  Surgeon: [name], MD;  Location: [facility] ENDOSCOPY;  Service: Cardiovascular;  Laterality: N/A;  . Loop recorder implant N/A 07/06/2012    Procedure: LOOP RECORDER IMPLANT;  Surgeon: [name], MD;  Location: [facility] CATH LAB;  Service: Cardiovascular;  Laterality: N/A;    Social History Milana Na Deboard  reports that he  quit smoking about 48 years ago. His smoking use included Cigarettes and Pipe. He has never used smokeless tobacco. He reports that he does not drink alcohol or use illicit drugs.  family history includes Brain cancer in his father; Dementia in his brother; Heart Problems in his mother; Parkinsonism in his brother.  No Known Allergies     PHYSICAL EXAMINATION: Vital signs: BP 118/64 mmHg  Pulse 72  Ht 5\' 11"  (1.803 m)  Wt 207 lb 4 oz (94.008 kg)  BMI 28.92 kg/m2 General: Well-developed, well-nourished, no acute distress HEENT: Sclerae are anicteric, conjunctiva pink. Oral mucosa intact Lungs: Clear Heart: Regular Abdomen: soft, nontender, nondistended, no obvious ascites, no peritoneal signs, normal bowel sounds. No organomegaly. Extremities: No clubbing cyanosis or edema Psychiatric: alert and oriented x3. Cooperative     ASSESSMENT:  #1. GERD with endoscopic esophagitis and mild stricture. A symptomatically on PPI. Did not require esophageal dilation #2. History of hiccups intermittently. Possibly related to GERD #3. Index screening colonoscopy March 2007 negative for neoplasia. Mild diverticulosis only   PLAN:  #1. Continue reflux precautions #2. Continue omeprazole daily #3. Plans for surveillance colonoscopy March 2017 #4. Interval follow-up as needed for questions or problems   25 minutes was spent with the patient face-to-face. Greater than 50% of the time was spent counseling him regarding his condition and answering their questions

## 2014-11-14 LAB — CUP PACEART REMOTE DEVICE CHECK
Date Time Interrogation Session: 20160626025802
Zone Setting Detection Interval: 2000 ms
Zone Setting Detection Interval: 3000 ms
Zone Setting Detection Interval: 380 ms

## 2014-11-16 ENCOUNTER — Encounter: Payer: Self-pay | Admitting: Cardiology

## 2014-11-16 ENCOUNTER — Ambulatory Visit (INDEPENDENT_AMBULATORY_CARE_PROVIDER_SITE_OTHER): Payer: Medicare Other | Admitting: *Deleted

## 2014-11-16 DIAGNOSIS — I639 Cerebral infarction, unspecified: Secondary | ICD-10-CM | POA: Diagnosis not present

## 2014-11-18 LAB — CUP PACEART REMOTE DEVICE CHECK: Date Time Interrogation Session: 20160812135743

## 2014-11-22 NOTE — Progress Notes (Signed)
Loop recorder 

## 2014-12-16 ENCOUNTER — Ambulatory Visit (INDEPENDENT_AMBULATORY_CARE_PROVIDER_SITE_OTHER): Payer: Medicare Other | Admitting: *Deleted

## 2014-12-16 DIAGNOSIS — I639 Cerebral infarction, unspecified: Secondary | ICD-10-CM | POA: Diagnosis not present

## 2014-12-19 NOTE — Progress Notes (Signed)
Loop recorder 

## 2014-12-28 ENCOUNTER — Encounter: Payer: Self-pay | Admitting: Internal Medicine

## 2014-12-30 ENCOUNTER — Other Ambulatory Visit (INDEPENDENT_AMBULATORY_CARE_PROVIDER_SITE_OTHER): Payer: Medicare Other

## 2014-12-30 DIAGNOSIS — Z0189 Encounter for other specified special examinations: Secondary | ICD-10-CM | POA: Diagnosis not present

## 2014-12-30 DIAGNOSIS — Z0000 Encounter for general adult medical examination without abnormal findings: Secondary | ICD-10-CM

## 2014-12-30 LAB — HEPATIC FUNCTION PANEL
ALT: 14 U/L (ref 0–53)
AST: 14 U/L (ref 0–37)
Albumin: 4.3 g/dL (ref 3.5–5.2)
Alkaline Phosphatase: 59 U/L (ref 39–117)
Bilirubin, Direct: 0.1 mg/dL (ref 0.0–0.3)
Total Bilirubin: 0.6 mg/dL (ref 0.2–1.2)
Total Protein: 6.4 g/dL (ref 6.0–8.3)

## 2014-12-30 LAB — CBC WITH DIFFERENTIAL/PLATELET
Basophils Absolute: 0 10*3/uL (ref 0.0–0.1)
Basophils Relative: 0.5 % (ref 0.0–3.0)
Eosinophils Absolute: 0.1 10*3/uL (ref 0.0–0.7)
Eosinophils Relative: 1.3 % (ref 0.0–5.0)
HCT: 46 % (ref 39.0–52.0)
Hemoglobin: 15.2 g/dL (ref 13.0–17.0)
Lymphocytes Relative: 15.9 % (ref 12.0–46.0)
Lymphs Abs: 0.9 10*3/uL (ref 0.7–4.0)
MCHC: 33 g/dL (ref 30.0–36.0)
MCV: 98.9 fl (ref 78.0–100.0)
Monocytes Absolute: 0.5 10*3/uL (ref 0.1–1.0)
Monocytes Relative: 9.3 % (ref 3.0–12.0)
Neutro Abs: 4.2 10*3/uL (ref 1.4–7.7)
Neutrophils Relative %: 73 % (ref 43.0–77.0)
Platelets: 150 10*3/uL (ref 150.0–400.0)
RBC: 4.65 Mil/uL (ref 4.22–5.81)
RDW: 13.4 % (ref 11.5–15.5)
WBC: 5.7 10*3/uL (ref 4.0–10.5)

## 2014-12-30 LAB — BASIC METABOLIC PANEL
BUN: 20 mg/dL (ref 6–23)
CO2: 28 mEq/L (ref 19–32)
Calcium: 9.5 mg/dL (ref 8.4–10.5)
Chloride: 105 mEq/L (ref 96–112)
Creatinine, Ser: 1.14 mg/dL (ref 0.40–1.50)
GFR: 66.24 mL/min (ref >60.00)
Glucose, Bld: 89 mg/dL (ref 70–99)
Potassium: 4.4 mEq/L (ref 3.5–5.1)
Sodium: 142 mEq/L (ref 135–145)

## 2014-12-30 LAB — TSH: TSH: 1.16 u[IU]/mL (ref 0.35–4.50)

## 2014-12-30 LAB — LIPID PANEL
Cholesterol: 136 mg/dL (ref 0–200)
HDL: 33.8 mg/dL — ABNORMAL LOW (ref >39.00)
LDL Cholesterol: 88 mg/dL (ref 0–99)
NonHDL: 102.59
Total CHOL/HDL Ratio: 4
Triglycerides: 72 mg/dL (ref 0.0–149.0)
VLDL: 14.4 mg/dL (ref 0.0–40.0)

## 2014-12-30 LAB — URINALYSIS, ROUTINE W REFLEX MICROSCOPIC
Bilirubin Urine: NEGATIVE
Hgb urine dipstick: NEGATIVE
Ketones, ur: NEGATIVE
Leukocytes, UA: NEGATIVE
Nitrite: NEGATIVE
Specific Gravity, Urine: 1.015 (ref 1.000–1.030)
Total Protein, Urine: NEGATIVE
Urine Glucose: NEGATIVE
Urobilinogen, UA: 0.2 (ref 0.0–1.0)
WBC, UA: NONE SEEN (ref 0–?)
pH: 5.5 (ref 5.0–8.0)

## 2014-12-30 LAB — PSA: PSA: 0.56 ng/mL (ref 0.10–4.00)

## 2015-01-02 ENCOUNTER — Ambulatory Visit (INDEPENDENT_AMBULATORY_CARE_PROVIDER_SITE_OTHER): Payer: Medicare Other | Admitting: Neurology

## 2015-01-02 ENCOUNTER — Encounter: Payer: Self-pay | Admitting: Neurology

## 2015-01-02 VITALS — BP 110/58 | HR 73 | Ht 73.0 in | Wt 205.6 lb

## 2015-01-02 DIAGNOSIS — I6990 Unspecified sequelae of unspecified cerebrovascular disease: Secondary | ICD-10-CM

## 2015-01-02 NOTE — Patient Instructions (Signed)
I had a long d/w patient and wife about his remote stroke, risk for recurrent stroke/TIAs, personally independently reviewed imaging studies and stroke evaluation results and answered questions.Continue Aggrenox  for secondary stroke prevention and maintain strict control of hypertension with blood pressure goal below 130/90, diabetes with hemoglobin A1c goal below 6.5% and lipids with LDL cholesterol goal below 100 mg/dL. I also advised the patient to eat a healthy diet with plenty of whole grains, cereals, fruits and vegetables, exercise regularly and maintain ideal body weight. Since the patient has been free of recurrent neurovascular symptoms for 3 years I do not recommend routine follow-up appointment with me is necessary. Check follow-up carotid ultrasound study. Followup in the future with me in the future only if necessary.

## 2015-01-02 NOTE — Progress Notes (Signed)
PATIENT: Jeffrey Hardy DOB: [birth year]  REASON FOR VISIT: routine follow up for stroke HISTORY FROM: patient, wife  HISTORY OF PRESENT ILLNESS: 79 year-old Caucasian male with a right frontal and left frontal embolic infarcts in September 2013 without definite identified source. Vascular risk factors of hypertension and hyperlipidemia.   Jeffrey Hardy returns for stroke followup, last visit was with me on 06/25/13. He continues to do well from a neurovascular standpoint and has not had any stroke or TIA symptoms now for over a year. He is tolerating Aggrenox well without any side effects.  He is due for his annual physical exam soon when he'll have repeat lipid profile checked.  He was not able to tolerate Lipitor in the past, he states that it made his legs feel "rubbery."  His blood pressure also is under good control, even on the low side.  His wife states that he falls asleep easily and doesn't seem to have the energy he used to.  Last carotid ultrasound on 12/17/12 showed no hemodynamically significant stenosis.  Update 01/02/2015 : He returns for follow-up after last visit a year ago. Continues to do well without recurrent neurovascular symptoms now for 3 years. He remains on Aggrenox which is tolerating well without headache. Other side effects. His blood pressure remains well controlled and draped it is 110/58 in office. He was diagnosed with gastroesophageal reflux disease and had endoscopy recently. He has been started on Prilosec just seemed to work quite well. He remains on Prinivil for his blood pressure. He has no new complaints today. He has not had follow-up ultrasound of the carotids  For several years. REVIEW OF SYSTEMS: Full 14 system review of systems performed and notable only for:  No complaints   ALLERGIES: No Known Allergies  HOME MEDICATIONS: Outpatient Prescriptions Prior to Visit  Medication Sig Dispense Refill  . Cholecalciferol 1000 UNITS capsule Take 2,000 Units by  mouth daily.    [name] dipyridamole-aspirin (AGGRENOX) 200-25 MG per 12 hr capsule Take 1 capsule by mouth 2 (two) times daily. 180 capsule 4  . lisinopril (PRINIVIL,ZESTRIL) 5 MG tablet TAKE ONE (1) TABLET EACH DAY 30 tablet 5  . omeprazole (PRILOSEC) 20 MG capsule Take 1 capsule (20 mg total) by mouth daily. Take one pill on an empty stomach, 20 - 30 minutes prior to breakfast. 90 capsule 3  . tamsulosin (FLOMAX) 0.4 MG CAPS capsule Take 0.4 mg by mouth daily after breakfast.     No facility-administered medications prior to visit.    PHYSICAL EXAM Filed Vitals:   01/02/15 1059  BP: 110/58  Pulse: 73  Height: 6\' 1"  (1.854 m)  Weight: 205 lb 9.6 oz (93.26 kg)   Body mass index is 27.13 kg/(m^2).  Physical Exam  General: well developed, well nourished, seated, in no evident distress  Head: head normocephalic and atraumatic.   Neck: supple with no carotid or supraclavicular bruits  Cardiovascular: regular rate and rhythm, no murmurs  Musculoskeletal: no deformity  Vascular: Normal pulses all extremities   Neurologic Exam  Mental Status: Awake and fully alert. Oriented to place and time. Recent and remote memory intact. Attention span, concentration and fund of knowledge appropriate. Mood and affect appropriate.  Cranial Nerves: Fundoscopic exam not done. Pupils equal, briskly reactive to light. Extraocular movements full without nystagmus. Visual fields full to confrontation. Hearing intact. Facial sensation intact. Face, tongue, palate moves normally and symmetrically.  Motor: Normal bulk and tone. Normal strength in all tested extremity muscles. Diminished  fine finger movements in the right hand. Orbits left over right upper extremity.  Sensory: intact to touch and pinprick and vibratory sensation bilaterally.  Coordination: Rapid alternating movements normal in all extremities. Finger-to-nose and heel-to-shin performed accurately bilaterally.  Gait and Station: Arises from chair without  difficulty. Stance is normal. Gait demonstrates normal stride length and balance . Able to heel, toe and tandem walk without difficulty.  Reflexes: 1+ and symmetric. Toes downgoing.    ASSESSMENT: 76 year Caucasian male with a right frontal and left frontal embolic infarcts in September 2013 without definite identified source. Vascular risk factors of hypertension, hyperlipidemia and previous stroke. Stable and doing well  PLAN:  I had a long d/w patient and wife about his remote stroke, risk for recurrent stroke/TIAs, personally independently reviewed imaging studies and stroke evaluation results and answered questions.Continue Aggrenox  for secondary stroke prevention and maintain strict control of hypertension with blood pressure goal below 130/90, diabetes with hemoglobin A1c goal below 6.5% and lipids with LDL cholesterol goal below 100 mg/dL. I also advised the patient to eat a healthy diet with plenty of whole grains, cereals, fruits and vegetables, exercise regularly and maintain ideal body weight. Since the patient has been free of recurrent neurovascular symptoms for 3 years I do not recommend routine follow-up appointment with me is necessary. Check follow-up carotid ultrasound study. Greater than 50% time of this 25 minute visit was spent on counseling and coordination of care. Followup in the future with me in the future only if necessary.   No orders of the defined types were placed in this encounter.   Return if symptoms worsen or fail to improve.  Antony Contras, MD  01/02/2015, 7:18 PM [facility] [address] [place], [place] [address] [phone]  Note: This document was prepared with digital dictation and possible smart phrase technology. Any transcriptional errors that result from this process are unintentional.

## 2015-01-03 ENCOUNTER — Encounter: Payer: Self-pay | Admitting: Internal Medicine

## 2015-01-03 ENCOUNTER — Ambulatory Visit (INDEPENDENT_AMBULATORY_CARE_PROVIDER_SITE_OTHER): Payer: Medicare Other | Admitting: Internal Medicine

## 2015-01-03 VITALS — BP 114/60 | HR 54 | Temp 97.7°F | Wt 206.8 lb

## 2015-01-03 DIAGNOSIS — Z23 Encounter for immunization: Secondary | ICD-10-CM

## 2015-01-03 DIAGNOSIS — Z0000 Encounter for general adult medical examination without abnormal findings: Secondary | ICD-10-CM | POA: Diagnosis not present

## 2015-01-03 NOTE — Progress Notes (Signed)
Pre visit review using our clinic review tool, if applicable. No additional management support is needed unless otherwise documented below in the visit note. 

## 2015-01-03 NOTE — Progress Notes (Signed)
Subjective:    Patient ID: Jeffrey Hardy, male    DOB: 01/20/1936, 79 y.o.   MRN: [id number]  HPI  Here for wellness and f/u;  Overall doing ok;  Pt denies Chest pain, worsening SOB, DOE, wheezing, orthopnea, PND, worsening LE edema, palpitations, dizziness or syncope.  Pt denies neurological change such as new headache, facial or extremity weakness.  Pt denies polydipsia, polyuria, or low sugar symptoms. Pt states overall good compliance with treatment and medications, good tolerability, and has been trying to follow appropriate diet.  Pt denies worsening depressive symptoms, suicidal ideation or panic. No fever, night sweats, wt loss, loss of appetite, or other constitutional symptoms.  Pt states good ability with ADL's, has low fall risk, home safety reviewed and adequate, no other significant changes in hearing or vision, and only occasionally active with exercise.  No new complaints Past Medical History  Diagnosis Date  . Hypertension   . Erectile dysfunction   . Hyperlipidemia   . Anxiety   . Depression   . GERD (gastroesophageal reflux disease)   . Diverticula, colon   . Syncope     History  . Mitral regurgitation   . Peptic ulcer disease     with Duodenal and Antral ulcers  . H. pylori infection   . HYPERLIPIDEMIA 05/12/2008  . ANXIETY 05/12/2008  . DEPRESSION 05/12/2008  . HYPERTENSION 05/12/2008  . GERD 05/12/2008  . PEPTIC ULCER DISEASE 05/12/2008  . DIVERTICULOSIS, COLON 05/12/2008  . OVERACTIVE BLADDER 05/12/2008  . Fracture of knee region 1974    with residual weakness  . History of stroke 12/18/2011  . Stroke   . Hearing loss    Past Surgical History  Procedure Laterality Date  . Appendectomy    . Tee without cardioversion  12/19/2011    Procedure: TRANSESOPHAGEAL ECHOCARDIOGRAM (TEE);  Surgeon: [name], MD;  Location: [facility] ENDOSCOPY;  Service: Cardiovascular;  Laterality: N/A;  . Loop recorder implant N/A 07/06/2012    Procedure: LOOP RECORDER IMPLANT;  Surgeon: [name], MD;  Location: [facility] CATH LAB;  Service: Cardiovascular;  Laterality: N/A;    reports that he quit smoking about 48 years ago. His smoking use included Cigarettes and Pipe. He has never used smokeless tobacco. He reports that he does not drink alcohol or use illicit drugs. family history includes Brain cancer in his father; Dementia in his brother; Heart Problems in his mother; Parkinsonism in his brother. No Known Allergies Current Outpatient Prescriptions on File Prior to Visit  Medication Sig Dispense Refill  . Cholecalciferol 1000 UNITS capsule Take 2,000 Units by mouth daily.    [name] dipyridamole-aspirin (AGGRENOX) 200-25 MG per 12 hr capsule Take 1 capsule by mouth 2 (two) times daily. 180 capsule 4  . lisinopril (PRINIVIL,ZESTRIL) 5 MG tablet TAKE ONE (1) TABLET EACH DAY 30 tablet 5  . omeprazole (PRILOSEC) 20 MG capsule Take 1 capsule (20 mg total) by mouth daily. Take one pill on an empty stomach, 20 - 30 minutes prior to breakfast. 90 capsule 3  . tamsulosin (FLOMAX) 0.4 MG CAPS capsule Take 0.4 mg by mouth daily after breakfast.     No current facility-administered medications on file prior to visit.   Review of Systems Constitutional: Negative for increased diaphoresis, other activity, appetite or siginficant weight change other than noted HENT: Negative for worsening hearing loss, ear pain, facial swelling, mouth sores and neck stiffness.   Eyes: Negative for other worsening pain, redness or visual disturbance.  Respiratory: Negative  for shortness of breath and wheezing  Cardiovascular: Negative for chest pain and palpitations.  Gastrointestinal: Negative for diarrhea, blood in stool, abdominal distention or other pain Genitourinary: Negative for hematuria, flank pain or change in urine volume.  Musculoskeletal: Negative for myalgias or other joint complaints.  Skin: Negative for color change and wound or drainage.  Neurological: Negative for syncope and numbness. other than  noted Hematological: Negative for adenopathy. or other swelling Psychiatric/Behavioral: Negative for hallucinations, SI, self-injury, decreased concentration or other worsening agitation.      Objective:   Physical Exam BP 114/60 mmHg  Pulse 54  Temp(Src) 97.7 F (36.5 C)  Wt 206 lb 12 oz (93.781 kg)  SpO2 98% VS noted,  Constitutional: Pt is oriented to person, place, and time. Appears well-developed and well-nourished, in no significant distress Head: Normocephalic and atraumatic.  Right Ear: External ear normal.  Left Ear: External ear normal.  Nose: Nose normal.  Mouth/Throat: Oropharynx is clear and moist.  Eyes: Conjunctivae and EOM are normal. Pupils are equal, round, and reactive to light.  Neck: Normal range of motion. Neck supple. No JVD present. No tracheal deviation present or significant neck LA or mass Cardiovascular: Normal rate, regular rhythm, normal heart sounds and intact distal pulses.   Pulmonary/Chest: Effort normal and breath sounds without rales or wheezing  Abdominal: Soft. Bowel sounds are normal. NT. No HSM  Musculoskeletal: Normal range of motion. Exhibits no edema.  Lymphadenopathy:  Has no cervical adenopathy.  Neurological: Pt is alert and oriented to person, place, and time. Pt has normal reflexes. No cranial nerve deficit. Motor grossly intact Skin: Skin is warm and dry. No rash noted.  Psychiatric:  Has normal mood and affect. Behavior is normal.     Assessment & Plan:

## 2015-01-03 NOTE — Patient Instructions (Addendum)
You had the flu shot today  Please continue all other medications as before, and refills have been done if requested.  Please have the pharmacy call with any other refills you may need.  Please continue your efforts at being more active, low cholesterol diet, and weight control.  You are otherwise up to date with prevention measures today.  Please keep your appointments with your specialists as you may have planned  Please return in 6 months, or sooner if needed 

## 2015-01-03 NOTE — Assessment & Plan Note (Signed)

## 2015-01-05 ENCOUNTER — Encounter: Payer: Self-pay | Admitting: Cardiology

## 2015-01-12 ENCOUNTER — Telehealth: Payer: Self-pay | Admitting: *Deleted

## 2015-01-12 ENCOUNTER — Ambulatory Visit (INDEPENDENT_AMBULATORY_CARE_PROVIDER_SITE_OTHER): Payer: Medicare Other

## 2015-01-12 DIAGNOSIS — I6990 Unspecified sequelae of unspecified cerebrovascular disease: Secondary | ICD-10-CM

## 2015-01-12 NOTE — Telephone Encounter (Signed)
LMOM requesting manual transmission from loop recorder.

## 2015-01-13 LAB — CUP PACEART REMOTE DEVICE CHECK: Date Time Interrogation Session: 20160909030643

## 2015-01-13 NOTE — Progress Notes (Signed)
Carelink summary report received. Battery status OK. Normal device function. No new symptom episodes, tachy episodes, brady, or pause episodes. No new AF episodes. Monthly summary reports and ROV w/ SK 7/17.

## 2015-01-16 ENCOUNTER — Encounter: Payer: Self-pay | Admitting: Internal Medicine

## 2015-01-16 NOTE — Telephone Encounter (Signed)
[facility] requesting call back.  Need patient to send manual transmission from [facility] monitor.

## 2015-01-17 NOTE — Telephone Encounter (Signed)
Attempted to reach patient/wife.  Phone was picked up but no one spoke.  Will attempt again tomorrow.

## 2015-01-18 ENCOUNTER — Telehealth: Payer: Self-pay

## 2015-01-18 NOTE — Telephone Encounter (Signed)
-----   Message from [name], MD sent at 01/17/2015  6:41 PM EDT ----- Jeffrey Hardy inform patient that carotid dopplers show no major blockages only mild age related hardening of arteries which is an expected finding

## 2015-01-18 NOTE — Telephone Encounter (Signed)
LFt vm for patient to call back about the of the carotid doppler results.

## 2015-01-19 NOTE — Telephone Encounter (Signed)
Rn receive call from patientabout his carotid dopplers showed no major blockages only mild related hardening, pt understand results.

## 2015-01-19 NOTE — Telephone Encounter (Signed)
-----   Message from [name], MD sent at 01/17/2015  6:41 PM EDT ----- Jeffrey Hardy inform patient that carotid dopplers show no major blockages only mild age related hardening of arteries which is an expected finding

## 2015-01-20 NOTE — Telephone Encounter (Signed)
Patient sent manual transmission on 01/17/15.  Per patient's wife, patient was asymptomatic with tachy episode on 12/24/14.  She states he has not had any dizziness or palpitations and has been "doing well".  Advised patient's wife to call if patient experiences worsening symptoms, questions, or concerns.  Patient's wife voices understanding denies any additional questions or concerns at this time.

## 2015-02-02 ENCOUNTER — Encounter: Payer: Self-pay | Admitting: Cardiology

## 2015-02-06 ENCOUNTER — Telehealth: Payer: Self-pay | Admitting: Internal Medicine

## 2015-02-06 DIAGNOSIS — I6350 Cerebral infarction due to unspecified occlusion or stenosis of unspecified cerebral artery: Secondary | ICD-10-CM

## 2015-02-06 NOTE — Telephone Encounter (Signed)
Patient's spouse called stating they need Dr. Jenny Reichmann to prescribe his aggrenox. His neurologist told them that Dr. Jenny Reichmann needed to prescribe this because the patient no longer needs to see neurology. Pt takes 2 pills per day. Uses [facility].

## 2015-02-07 MED ORDER — ASPIRIN-DIPYRIDAMOLE ER 25-200 MG PO CP12
1.0000 | ORAL_CAPSULE | Freq: Two times a day (BID) | ORAL | Status: DC
Start: 2015-02-07 — End: 2015-07-27

## 2015-02-07 NOTE — Telephone Encounter (Signed)
Done erx 

## 2015-02-10 ENCOUNTER — Encounter: Payer: Self-pay | Admitting: Cardiology

## 2015-02-14 ENCOUNTER — Ambulatory Visit (INDEPENDENT_AMBULATORY_CARE_PROVIDER_SITE_OTHER): Payer: Medicare Other | Admitting: *Deleted

## 2015-02-14 DIAGNOSIS — I639 Cerebral infarction, unspecified: Secondary | ICD-10-CM

## 2015-02-14 NOTE — Progress Notes (Signed)
Carelink Summary Report / Loop recorder 

## 2015-02-16 ENCOUNTER — Encounter: Payer: Self-pay | Admitting: Cardiology

## 2015-02-20 ENCOUNTER — Encounter: Payer: Self-pay | Admitting: Internal Medicine

## 2015-02-24 ENCOUNTER — Encounter: Payer: Self-pay | Admitting: Internal Medicine

## 2015-02-24 ENCOUNTER — Telehealth: Payer: Self-pay | Admitting: Internal Medicine

## 2015-02-24 NOTE — Telephone Encounter (Signed)
New problem    Pt need to speak to device about a letter that he keeps getting concerning his device.

## 2015-02-24 NOTE — Telephone Encounter (Signed)
LMOVM for pt to return call 

## 2015-02-27 ENCOUNTER — Telehealth: Payer: Self-pay | Admitting: Internal Medicine

## 2015-02-27 NOTE — Telephone Encounter (Signed)
2nd attempt  LMOVM for pt to return call.  

## 2015-02-27 NOTE — Telephone Encounter (Signed)
Follow Up  ° °Pt returned the call  °

## 2015-02-27 NOTE — Telephone Encounter (Signed)
3rd attempt   LMOVM for pt to return call.  

## 2015-02-27 NOTE — Telephone Encounter (Signed)
Follow Up  Pt returned call to discuss the letters. Previous stream of  messages was closed unintentionally.

## 2015-02-27 NOTE — Telephone Encounter (Signed)
LMOVM for pt to return call 

## 2015-03-01 ENCOUNTER — Telehealth: Payer: Self-pay | Admitting: *Deleted

## 2015-03-01 NOTE — Telephone Encounter (Signed)
Called patient regarding tachy episodes on LINQ transmissions from 01/21/15-02/23/15.  Patient denies symptoms, states he has been "feeling great".  Regarding disconnected monitor letters, made patient aware that if he receives a letter that states his monitor was disconnected, he just needs to send a manual transmission.  Patient appreciative of call and is aware to call if he experiences worsening symptoms or has questions or concerns.

## 2015-03-03 LAB — CUP PACEART REMOTE DEVICE CHECK: Date Time Interrogation Session: 20161108040640

## 2015-03-08 ENCOUNTER — Encounter: Payer: Self-pay | Admitting: Cardiovascular Disease

## 2015-03-09 ENCOUNTER — Encounter: Payer: Self-pay | Admitting: Cardiology

## 2015-03-15 ENCOUNTER — Ambulatory Visit (INDEPENDENT_AMBULATORY_CARE_PROVIDER_SITE_OTHER): Payer: Medicare Other | Admitting: *Deleted

## 2015-03-15 DIAGNOSIS — I639 Cerebral infarction, unspecified: Secondary | ICD-10-CM

## 2015-03-16 NOTE — Progress Notes (Signed)
Carelink Summary Report / Loop Recorder 

## 2015-03-17 ENCOUNTER — Encounter: Payer: Self-pay | Admitting: Cardiology

## 2015-03-23 ENCOUNTER — Encounter: Payer: Self-pay | Admitting: Cardiology

## 2015-03-30 ENCOUNTER — Encounter: Payer: Self-pay | Admitting: Cardiology

## 2015-04-06 ENCOUNTER — Encounter: Payer: Self-pay | Admitting: Cardiology

## 2015-04-14 ENCOUNTER — Encounter: Payer: Medicare Other | Admitting: *Deleted

## 2015-04-19 LAB — CUP PACEART REMOTE DEVICE CHECK: Date Time Interrogation Session: 20161208040604

## 2015-04-19 NOTE — Progress Notes (Signed)
Carelink summary report received. Battery status OK. Normal device function. 7 tachy SVT episodes, pt asymptomatic, previously reviewed by SK. No new symptom episodes, brady, or pause episodes. No new AF episodes. Monthly summary reports and ROV/PRN

## 2015-05-01 ENCOUNTER — Encounter: Payer: Self-pay | Admitting: Internal Medicine

## 2015-05-15 ENCOUNTER — Ambulatory Visit (INDEPENDENT_AMBULATORY_CARE_PROVIDER_SITE_OTHER): Payer: Medicare Other | Admitting: *Deleted

## 2015-05-15 DIAGNOSIS — I639 Cerebral infarction, unspecified: Secondary | ICD-10-CM | POA: Diagnosis not present

## 2015-05-17 NOTE — Progress Notes (Signed)
Carelink Summary Report / Loop Recorder 

## 2015-05-26 ENCOUNTER — Telehealth: Payer: Self-pay | Admitting: *Deleted

## 2015-05-26 NOTE — Telephone Encounter (Addendum)
Called patient to request that he send a manual transmission for review.  Patient states he will send a transmission by the end of the weekend.  He denies any questions or concerns at this time.

## 2015-06-06 ENCOUNTER — Encounter: Payer: Self-pay | Admitting: Internal Medicine

## 2015-06-09 ENCOUNTER — Encounter: Payer: Self-pay | Admitting: Cardiology

## 2015-06-09 NOTE — Telephone Encounter (Signed)
LMOVM requesting that patient send a manual transmission from his home monitor.  Gave device clinic phone number for questions or concerns.

## 2015-06-13 ENCOUNTER — Ambulatory Visit (INDEPENDENT_AMBULATORY_CARE_PROVIDER_SITE_OTHER): Payer: Medicare Other | Admitting: *Deleted

## 2015-06-13 DIAGNOSIS — I639 Cerebral infarction, unspecified: Secondary | ICD-10-CM | POA: Diagnosis not present

## 2015-06-16 NOTE — Progress Notes (Signed)
Carelink Summary Report / Loop Recorder 

## 2015-06-17 LAB — CUP PACEART REMOTE DEVICE CHECK: Date Time Interrogation Session: 20170308043540

## 2015-06-17 NOTE — Progress Notes (Signed)
Carelink summary report received. Battery status OK. Normal device function. No new symptom episodes, brady, or pause episodes. No new AF episodes. 1 tachy episode, previously reviewed, SVT. Monthly summary reports and ROV/PRN 

## 2015-06-23 ENCOUNTER — Encounter: Payer: Self-pay | Admitting: Internal Medicine

## 2015-06-28 NOTE — Telephone Encounter (Signed)
Second attempt:  LMOVM requesting that patient send a manual transmission from his home monitor.  Gave device clinic phone number for questions or concerns.

## 2015-06-30 ENCOUNTER — Telehealth: Payer: Self-pay | Admitting: Cardiology

## 2015-06-30 NOTE — Telephone Encounter (Signed)
Spoke w/ pt wife and she is going to have pt call me back on Monday 07-03-15.

## 2015-07-03 ENCOUNTER — Telehealth: Payer: Self-pay | Admitting: Internal Medicine

## 2015-07-03 NOTE — Telephone Encounter (Signed)
F/u  Pt returning [facility] phone call. Please call back and discuss.

## 2015-07-03 NOTE — Telephone Encounter (Signed)
Spoke w/ pt and requested that he send a manual transmission b/c his home monitor has not updated in at least 14 days. Pt verbalized understanding.

## 2015-07-04 ENCOUNTER — Ambulatory Visit (INDEPENDENT_AMBULATORY_CARE_PROVIDER_SITE_OTHER): Payer: Medicare Other | Admitting: Internal Medicine

## 2015-07-04 ENCOUNTER — Telehealth: Payer: Self-pay | Admitting: Neurology

## 2015-07-04 ENCOUNTER — Other Ambulatory Visit (INDEPENDENT_AMBULATORY_CARE_PROVIDER_SITE_OTHER): Payer: Medicare Other

## 2015-07-04 ENCOUNTER — Encounter: Payer: Self-pay | Admitting: Internal Medicine

## 2015-07-04 VITALS — BP 140/76 | HR 60 | Temp 97.8°F | Resp 20 | Wt 205.0 lb

## 2015-07-04 DIAGNOSIS — E785 Hyperlipidemia, unspecified: Secondary | ICD-10-CM

## 2015-07-04 DIAGNOSIS — R739 Hyperglycemia, unspecified: Secondary | ICD-10-CM

## 2015-07-04 DIAGNOSIS — Z86010 Personal history of colonic polyps: Secondary | ICD-10-CM

## 2015-07-04 DIAGNOSIS — N179 Acute kidney failure, unspecified: Secondary | ICD-10-CM | POA: Diagnosis not present

## 2015-07-04 DIAGNOSIS — Z0000 Encounter for general adult medical examination without abnormal findings: Secondary | ICD-10-CM

## 2015-07-04 DIAGNOSIS — I10 Essential (primary) hypertension: Secondary | ICD-10-CM | POA: Diagnosis not present

## 2015-07-04 LAB — URINALYSIS, ROUTINE W REFLEX MICROSCOPIC
Bilirubin Urine: NEGATIVE
Hgb urine dipstick: NEGATIVE
Ketones, ur: NEGATIVE
Leukocytes, UA: NEGATIVE
Nitrite: NEGATIVE
RBC / HPF: NONE SEEN (ref 0–?)
Specific Gravity, Urine: <=1.005 — AB (ref 1.000–1.030)
Total Protein, Urine: NEGATIVE
Urine Glucose: NEGATIVE
Urobilinogen, UA: 0.2 (ref 0.0–1.0)
pH: 6 (ref 5.0–8.0)

## 2015-07-04 LAB — CBC WITH DIFFERENTIAL/PLATELET
Basophils Absolute: 0 10*3/uL (ref 0.0–0.1)
Basophils Relative: 0.5 % (ref 0.0–3.0)
Eosinophils Absolute: 0.1 10*3/uL (ref 0.0–0.7)
Eosinophils Relative: 1.3 % (ref 0.0–5.0)
HCT: 42.3 % (ref 39.0–52.0)
Hemoglobin: 14.3 g/dL (ref 13.0–17.0)
Lymphocytes Relative: 18 % (ref 12.0–46.0)
Lymphs Abs: 0.7 10*3/uL (ref 0.7–4.0)
MCHC: 33.8 g/dL (ref 30.0–36.0)
MCV: 97.4 fl (ref 78.0–100.0)
Monocytes Absolute: 0.5 10*3/uL (ref 0.1–1.0)
Monocytes Relative: 10.9 % (ref 3.0–12.0)
Neutro Abs: 2.9 10*3/uL (ref 1.4–7.7)
Neutrophils Relative %: 69.3 % (ref 43.0–77.0)
Platelets: 123 10*3/uL — ABNORMAL LOW (ref 150.0–400.0)
RBC: 4.34 Mil/uL (ref 4.22–5.81)
RDW: 13.6 % (ref 11.5–15.5)
WBC: 4.1 10*3/uL (ref 4.0–10.5)

## 2015-07-04 LAB — BASIC METABOLIC PANEL
BUN: 16 mg/dL (ref 6–23)
CO2: 29 mEq/L (ref 19–32)
Calcium: 9.5 mg/dL (ref 8.4–10.5)
Chloride: 105 mEq/L (ref 96–112)
Creatinine, Ser: 1.1 mg/dL (ref 0.40–1.50)
GFR: 68.93 mL/min (ref >60.00)
Glucose, Bld: 86 mg/dL (ref 70–99)
Potassium: 4.2 mEq/L (ref 3.5–5.1)
Sodium: 141 mEq/L (ref 135–145)

## 2015-07-04 LAB — HEPATIC FUNCTION PANEL
ALT: 18 U/L (ref 0–53)
AST: 16 U/L (ref 0–37)
Albumin: 4.3 g/dL (ref 3.5–5.2)
Alkaline Phosphatase: 58 U/L (ref 39–117)
Bilirubin, Direct: 0.1 mg/dL (ref 0.0–0.3)
Total Bilirubin: 0.7 mg/dL (ref 0.2–1.2)
Total Protein: 6.2 g/dL (ref 6.0–8.3)

## 2015-07-04 LAB — LIPID PANEL
Cholesterol: 145 mg/dL (ref 0–200)
HDL: 35.1 mg/dL — ABNORMAL LOW (ref >39.00)
LDL Cholesterol: 94 mg/dL (ref 0–99)
NonHDL: 109.67
Total CHOL/HDL Ratio: 4
Triglycerides: 76 mg/dL (ref 0.0–149.0)
VLDL: 15.2 mg/dL (ref 0.0–40.0)

## 2015-07-04 LAB — TSH: TSH: 1.26 u[IU]/mL (ref 0.35–4.50)

## 2015-07-04 LAB — HEMOGLOBIN A1C: Hgb A1c MFr Bld: 5.6 % (ref 4.6–6.5)

## 2015-07-04 LAB — PSA: PSA: 0.81 ng/mL (ref 0.10–4.00)

## 2015-07-04 MED ORDER — LISINOPRIL 5 MG PO TABS
ORAL_TABLET | ORAL | Status: DC
Start: 2015-07-04 — End: 2016-07-23

## 2015-07-04 NOTE — Progress Notes (Signed)
Subjective:    Patient ID: Jeffrey Hardy, male    DOB: November 05, 1935, 80 y.o.   MRN: [id number]  HPI  Here for wellness and f/u;  Overall doing ok;  Pt denies Chest pain, worsening SOB, DOE, wheezing, orthopnea, PND, worsening LE edema, palpitations, dizziness or syncope.  Pt denies neurological change such as new headache, facial or extremity weakness.  Pt denies polydipsia, polyuria, or low sugar symptoms. Pt states overall good compliance with treatment and medications, good tolerability, and has been trying to follow appropriate diet.  Pt denies worsening depressive symptoms, suicidal ideation or panic. No fever, night sweats, wt loss, loss of appetite, or other constitutional symptoms.  Pt states good ability with ADL's, has low fall risk, home safety reviewed and adequate, no other significant changes in hearing or vision, and only occasionally active with exercise.  Brother died in [place], had vagal rxn at the funeral, seen at ED , had elev cr 1.55, glc 132 ([facility]  facility) with low volume, for f/u today.   Past Medical History  Diagnosis Date  . Hypertension   . Erectile dysfunction   . Hyperlipidemia   . Anxiety   . Depression   . GERD (gastroesophageal reflux disease)   . Diverticula, colon   . Syncope     History  . Mitral regurgitation   . Peptic ulcer disease     with Duodenal and Antral ulcers  . H. pylori infection   . HYPERLIPIDEMIA 05/12/2008  . ANXIETY 05/12/2008  . DEPRESSION 05/12/2008  . HYPERTENSION 05/12/2008  . GERD 05/12/2008  . PEPTIC ULCER DISEASE 05/12/2008  . DIVERTICULOSIS, COLON 05/12/2008  . OVERACTIVE BLADDER 05/12/2008  . Fracture of knee region 1974    with residual weakness  . History of stroke 12/18/2011  . Stroke ([place])   . Hearing loss    Past Surgical History  Procedure Laterality Date  . Appendectomy    . Tee without cardioversion  12/19/2011    Procedure: TRANSESOPHAGEAL ECHOCARDIOGRAM (TEE);  Surgeon: [name], MD;  Location[facility] ENDOSCOPY;   Service: Cardiovascular;  Laterality: N/A;  . Loop recorder implant N/A 07/06/2012    Procedure: LOOP RECORDER IMPLANT;  Surgeon: [name], MD;  Location: [facility] CATH LAB;  Service: Cardiovascular;  Laterality: N/A;  . Wrist fracture surgery Right     reports that he quit smoking about 48 years ago. His smoking use included Cigarettes and Pipe. He has never used smokeless tobacco. He reports that he does not drink alcohol or use illicit drugs. family history includes Brain cancer in his father; Dementia in his brother; Heart Problems in his mother; Parkinsonism in his brother. There is no history of Colon cancer, Esophageal cancer, Stomach cancer, Gastric cancer, Liver disease, Kidney disease, or Diabetes. No Known Allergies Current Outpatient Prescriptions on File Prior to Visit  Medication Sig Dispense Refill  . Cholecalciferol 1000 UNITS capsule Take 2,000 Units by mouth daily.    [name] dipyridamole-aspirin (AGGRENOX) 200-25 MG 12hr capsule Take 1 capsule by mouth 2 (two) times daily. 180 capsule 3  . omeprazole (PRILOSEC) 20 MG capsule Take 1 capsule (20 mg total) by mouth daily. Take one pill on an empty stomach, 20 - 30 minutes prior to breakfast. 90 capsule 3  . tamsulosin (FLOMAX) 0.4 MG CAPS capsule Take 0.4 mg by mouth daily after breakfast.     No current facility-administered medications on file prior to visit.   Review of Systems Constitutional: Negative for increased diaphoresis, or other activity,  appetite or siginficant weight change other than noted HENT: Negative for worsening hearing loss, ear pain, facial swelling, mouth sores and neck stiffness.   Eyes: Negative for other worsening pain, redness or visual disturbance.  Respiratory: Negative for choking or stridor Cardiovascular: Negative for other chest pain and palpitations.  Gastrointestinal: Negative for worsening diarrhea, blood in stool, or abdominal distention Genitourinary: Negative for hematuria, flank pain or  change in urine volume.  Musculoskeletal: Negative for myalgias or other joint complaints.  Skin: Negative for other color change and wound or drainage.  Neurological: Negative for syncope and numbness. other than noted Hematological: Negative for adenopathy. or other swelling Psychiatric/Behavioral: Negative for hallucinations, SI, self-injury, decreased concentration or other worsening agitation.      Objective:   Physical Exam BP 140/76 mmHg  Pulse 60  Temp(Src) 97.8 F (36.6 C) (Oral)  Resp 20  Wt 205 lb (92.987 kg)  SpO2 96% VS noted,  Constitutional: Pt is oriented to person, place, and time. Appears well-developed and well-nourished, in no significant distress Head: Normocephalic and atraumatic  Eyes: Conjunctivae and EOM are normal. Pupils are equal, round, and reactive to light Right Ear: External ear normal.  Left Ear: External ear normal Nose: Nose normal.  Mouth/Throat: Oropharynx is clear and moist  Neck: Normal range of motion. Neck supple. No JVD present. No tracheal deviation present or significant neck LA or mass Cardiovascular: Normal rate, regular rhythm, normal heart sounds and intact distal pulses.   Pulmonary/Chest: Effort normal and breath sounds without rales or wheezing  Abdominal: Soft. Bowel sounds are normal. NT. No HSM  Musculoskeletal: Normal range of motion. Exhibits no edema Lymphadenopathy: Has no cervical adenopathy.  Neurological: Pt is alert and oriented to person, at baseline confusion. Pt has normal reflexes. No cranial nerve deficit. Motor grossly intact Skin: Skin is warm and dry. No rash noted or new ulcers Psychiatric:  Has normal mood and affect. Behavior is normal.     Assessment & Plan:

## 2015-07-04 NOTE — Patient Instructions (Addendum)

## 2015-07-04 NOTE — Progress Notes (Signed)
Pre visit review using our clinic review tool, if applicable. No additional management support is needed unless otherwise documented below in the visit note. 

## 2015-07-04 NOTE — Telephone Encounter (Signed)
Rn call patients wife about the aspirin aggrenox medication increasing. Rn stated Dr. [name] release patient to his PCP. Rn stated pts PCP is prescribing the aspirin aggrenox. Rn stated that sometimes Plavix may cost more. Rn advised patients to look at a mail order pharmacy for a cheaper price by calling the insurance company. Pts wife stated she will look into that.

## 2015-07-04 NOTE — Telephone Encounter (Signed)
Jeffrey Hardy [phone] called, states when husband saw Dr. [name] at last visit, Dr. [name] released him back to PCP. They have ran in to a problem with dipyridamole-aspirin (AGGRENOX) 200-25 MG 12hr capsule, price has increased to $173.46, spoke to Dr. Cathlean Cower at visit this morning and Dr. Jenny Reichmann is reluctant to do anything. Dr. Jenny Reichmann recommended Plavix. Please call to advise.

## 2015-07-07 ENCOUNTER — Encounter: Payer: Self-pay | Admitting: Internal Medicine

## 2015-07-07 ENCOUNTER — Encounter: Payer: Self-pay | Admitting: Cardiology

## 2015-07-07 ENCOUNTER — Ambulatory Visit (INDEPENDENT_AMBULATORY_CARE_PROVIDER_SITE_OTHER): Payer: Medicare Other | Admitting: Internal Medicine

## 2015-07-07 VITALS — BP 122/60 | HR 76 | Ht 73.0 in | Wt 201.4 lb

## 2015-07-07 DIAGNOSIS — K219 Gastro-esophageal reflux disease without esophagitis: Secondary | ICD-10-CM | POA: Diagnosis not present

## 2015-07-07 DIAGNOSIS — Z86010 Personal history of colonic polyps: Secondary | ICD-10-CM | POA: Insufficient documentation

## 2015-07-07 DIAGNOSIS — Z1211 Encounter for screening for malignant neoplasm of colon: Secondary | ICD-10-CM | POA: Diagnosis not present

## 2015-07-07 DIAGNOSIS — K222 Esophageal obstruction: Secondary | ICD-10-CM

## 2015-07-07 DIAGNOSIS — G459 Transient cerebral ischemic attack, unspecified: Secondary | ICD-10-CM | POA: Diagnosis not present

## 2015-07-07 MED ORDER — NA SULFATE-K SULFATE-MG SULF 17.5-3.13-1.6 GM/177ML PO SOLN: ORAL | Status: DC

## 2015-07-07 NOTE — Assessment & Plan Note (Signed)
Improved, Mild to mod, for antibx course,  to f/u any worsening symptoms or concerns,  Lab Results  Component Value Date   CREATININE 1.10 07/04/2015

## 2015-07-07 NOTE — Assessment & Plan Note (Signed)

## 2015-07-07 NOTE — Assessment & Plan Note (Signed)
stable overall by history and exam, recent data reviewed with pt, and pt to continue medical treatment as before,  to f/u any worsening symptoms or concerns Lab Results  Component Value Date   LDLCALC 94 07/04/2015

## 2015-07-07 NOTE — Patient Instructions (Signed)
You have been scheduled for a colonoscopy. Please follow written instructions given to you at your visit today.  Please pick up your prep supplies at the pharmacy within the next 1-3 days. If you use inhalers (even only as needed), please bring them with you on the day of your procedure. Your physician has requested that you go to www.startemmi.com and enter the access code given to you at your visit today. This web site gives a general overview about your procedure. However, you should still follow specific instructions given to you by our office regarding your preparation for the procedure.  Please CONTINUE your Aggrenox for colonoscopy procedure.  If you are age 29 or older, your body mass index should be between 23-30. Your Body mass index is 26.58 kg/(m^2). If this is out of the aforementioned range listed, please consider follow up with your Primary Care Provider.  If you are age 59 or younger, your body mass index should be between 19-25. Your Body mass index is 26.58 kg/(m^2). If this is out of the aformentioned range listed, please consider follow up with your Primary Care Provider.

## 2015-07-07 NOTE — Assessment & Plan Note (Signed)
stable overall by history and exam, recent data reviewed with pt, and pt to continue medical treatment as before,  to f/u any worsening symptoms or concerns Lab Results  Component Value Date   HGBA1C 5.6 07/04/2015

## 2015-07-07 NOTE — Progress Notes (Signed)
HISTORY OF PRESENT ILLNESS:  Jeffrey Hardy is a 80 y.o. male with multiple medical problems who has a history of GERD for which she has been followed in this office. He presents today for ongoing management of his GERD as well as the need for screening colonoscopy. The patient reports being on Aggrenox for "small strokes" in September 2013. No residual deficits. No problems since. I last saw the patient last summer. On 09/16/2014 he underwent upper endoscopy for dysphagia and reflux. He was found to have esophagitis and early stricture. No dilation due to active esophagitis. He was prescribed omeprazole 20 mg daily. He didn't follow-up in the office 11/11/2014. At that time his reflux symptoms resolved as did his dysphagia. He had been having problems with hiccups intermittently which were felt possibly due to reflux, though not certain. The event, no problems with that issue recently. He is accompanied today by his wife. Terms of his recent blood work, CBC and cooperative metabolic panel earlier this week were entirely normal except for platelet count of 123,000. His last complete colonoscopy was performed 07/05/2005. This was normal except for mild sigmoid diverticulosis. Patient has not had interval history of colon cancer in his family. He denies active lower GI complaints. He is motivated for repeat colonoscopy. GI review of systems is otherwise negative  REVIEW OF SYSTEMS:  All non-GI ROS negative except for hearing problems  Past Medical History  Diagnosis Date  . Hypertension   . Erectile dysfunction   . Hyperlipidemia   . Anxiety   . Depression   . GERD (gastroesophageal reflux disease)   . Diverticula, colon   . Syncope     History  . Mitral regurgitation   . Peptic ulcer disease     with Duodenal and Antral ulcers  . H. pylori infection   . HYPERLIPIDEMIA 05/12/2008  . ANXIETY 05/12/2008  . DEPRESSION 05/12/2008  . HYPERTENSION 05/12/2008  . GERD 05/12/2008  . PEPTIC ULCER DISEASE  05/12/2008  . DIVERTICULOSIS, COLON 05/12/2008  . OVERACTIVE BLADDER 05/12/2008  . Fracture of knee region 1974    with residual weakness  . History of stroke 12/18/2011  . Stroke ([place])   . Hearing loss     Past Surgical History  Procedure Laterality Date  . Appendectomy    . Tee without cardioversion  12/19/2011    Procedure: TRANSESOPHAGEAL ECHOCARDIOGRAM (TEE);  Surgeon: [name], MD;  Location: [facility] ENDOSCOPY;  Service: Cardiovascular;  Laterality: N/A;  . Loop recorder implant N/A 07/06/2012    Procedure: LOOP RECORDER IMPLANT;  Surgeon: [name], MD;  Location: [facility] CATH LAB;  Service: Cardiovascular;  Laterality: N/A;  . Wrist fracture surgery Right     Social History Cameron Chrest Oppedisano  reports that he quit smoking about 48 years ago. His smoking use included Cigarettes and Pipe. He has never used smokeless tobacco. He reports that he does not drink alcohol or use illicit drugs.  family history includes Brain cancer in his father; Dementia in his brother; Heart Problems in his mother; Parkinsonism in his brother. There is no history of Colon cancer, Esophageal cancer, Stomach cancer, Gastric cancer, Liver disease, Kidney disease, or Diabetes.  No Known Allergies     PHYSICAL EXAMINATION: Vital signs: BP 122/60 mmHg  Pulse 76  Ht 6\' 1"  (1.854 m)  Wt 201 lb 6.4 oz (91.354 kg)  BMI 26.58 kg/m2  Constitutional: generally well-appearing, no acute distress Psychiatric: alert and oriented x3, cooperative Eyes: extraocular movements intact, anicteric, conjunctiva  pink Mouth: oral pharynx moist, no lesions Neck: supple without thyromegaly Lymph: no lymphadenopathy Cardiovascular: heart regular rate and rhythm, no murmur Lungs: clear to auscultation bilaterally Abdomen: soft, nontender, nondistended, no obvious ascites, no peritoneal signs, normal bowel sounds, no organomegaly Rectal: Deferred until colonoscopy Extremities: no clubbing cyanosis or lower extremity edema  bilaterally Skin: no lesions on visible extremities Neuro: No focal deficits. Cranial nerves intact and normal DTRs  ASSESSMENT:  #1. GERD with endoscopic evidence of esophagitis and early stricture formation. The patient remains asymptomatic on PPI without the need for esophageal dilation #2. Screening colonoscopy. Last examination 2007 as described. Appropriate candidate without contraindication #3. Multiple medical problems including a history of what sounds like a TIA now on Aggrenox   PLAN:  #1. Reflux precautions #2. Continue omeprazole 20 mg daily #3. Contact the office should you develop recurrent reflux symptoms or dysphagia despite PPI therapy #4. Schedule screening colonoscopy. The patient is higher than baseline risk due to his cerebrovascular history. We will keep him ON Aggrenox therapy for the examination.The nature of the procedure, as well as the risks, benefits, and alternatives were carefully and thoroughly reviewed with the patient. Ample time for discussion and questions allowed. The patient understood, was satisfied, and agreed to proceed.

## 2015-07-07 NOTE — Assessment & Plan Note (Signed)
Refer for f/u colonoscopy 

## 2015-07-07 NOTE — Assessment & Plan Note (Addendum)
stable overall by history and exam, recent data reviewed with pt, and pt to continue medical treatment as before,  to f/u any worsening symptoms or concerns BP Readings from Last 3 Encounters:  07/07/15 122/60  07/04/15 140/76  01/03/15 114/60   In addition to the time spent performing CPE, I spent an additional 40 minutes face to face,in which greater than 50% of this time was spent in counseling and coordination of care for patient's acute illness as documented.

## 2015-07-12 ENCOUNTER — Telehealth: Payer: Self-pay | Admitting: Internal Medicine

## 2015-07-12 NOTE — Telephone Encounter (Signed)
Spoke with patient, He will pay for Suprep out of pocket. Offered free Suprep sample but they did not want to drive up here to pick it up. Stated "We can afford the prep, Its just the principle of the thing"

## 2015-07-13 ENCOUNTER — Telehealth: Payer: Self-pay

## 2015-07-13 LAB — CUP PACEART REMOTE DEVICE CHECK: Date Time Interrogation Session: 20170206043622

## 2015-07-13 NOTE — Telephone Encounter (Signed)
PA initiated via CoverMyMeds Key [id number]

## 2015-07-13 NOTE — Progress Notes (Signed)
Carelink summary report received. Battery status OK. Normal device function. No new symptom episodes, brady, or pause episodes. 7 tachy episodes all previously addressed. 1 AF previously addressed. Monthly summary reports and ROV/PRN

## 2015-07-13 NOTE — Telephone Encounter (Signed)
Per [employer], PA not required. Medication is on formulary Tier 4

## 2015-07-14 ENCOUNTER — Telehealth: Payer: Self-pay | Admitting: Internal Medicine

## 2015-07-14 ENCOUNTER — Encounter: Payer: Medicare Other | Admitting: *Deleted

## 2015-07-14 ENCOUNTER — Encounter: Payer: Self-pay | Admitting: Internal Medicine

## 2015-07-14 NOTE — Telephone Encounter (Signed)
Returned patient's call.  Advised that we received his transmission and that I will review the episodes with Dr. [name].  Also made patient aware that his device is at RRT.  Currently, patient would like to leave device and is not interested in scheduling an appointment with Dr. [name] to discuss extraction.  Patient is aware that I will call him next week if Dr. [name] has any further recommendations.  He is appreciative of call and denies additional questions at this time.

## 2015-07-14 NOTE — Telephone Encounter (Signed)
New Message   4. Are you calling to see if we received your device transmission? yes

## 2015-07-17 ENCOUNTER — Encounter: Payer: Self-pay | Admitting: Internal Medicine

## 2015-07-17 ENCOUNTER — Ambulatory Visit (AMBULATORY_SURGERY_CENTER): Payer: Medicare Other | Admitting: Internal Medicine

## 2015-07-17 VITALS — BP 114/70 | HR 56 | Temp 97.3°F | Resp 20 | Ht 73.0 in | Wt 201.0 lb

## 2015-07-17 DIAGNOSIS — Z1211 Encounter for screening for malignant neoplasm of colon: Secondary | ICD-10-CM | POA: Diagnosis present

## 2015-07-17 DIAGNOSIS — D122 Benign neoplasm of ascending colon: Secondary | ICD-10-CM | POA: Diagnosis not present

## 2015-07-17 MED ORDER — SODIUM CHLORIDE 0.9 % IV SOLN: 500.0000 mL | INTRAVENOUS | Status: DC

## 2015-07-17 NOTE — Progress Notes (Signed)
To recovery, report to [place], RN, VSS 

## 2015-07-17 NOTE — Progress Notes (Signed)
Called to room to assist during endoscopic procedure.  Patient ID and intended procedure confirmed with present staff. Received instructions for my participation in the procedure from the performing physician.  

## 2015-07-17 NOTE — Telephone Encounter (Signed)
No additional recommendations at this time per Dr. [name].

## 2015-07-17 NOTE — Patient Instructions (Signed)
YOU HAD AN ENDOSCOPIC PROCEDURE TODAY AT THE [facility]:   Refer to the procedure report that was given to you for any specific questions about what was found during the examination.  If the procedure report does not answer your questions, please call your gastroenterologist to clarify.  If you requested that your care partner not be given the details of your procedure findings, then the procedure report has been included in a sealed envelope for you to review at your convenience later.  YOU SHOULD EXPECT: Some feelings of bloating in the abdomen. Passage of more gas than usual.  Walking can help get rid of the air that was put into your GI tract during the procedure and reduce the bloating. If you had a lower endoscopy (such as a colonoscopy or flexible sigmoidoscopy) you may notice spotting of blood in your stool or on the toilet paper. If you underwent a bowel prep for your procedure, you may not have a normal bowel movement for a few days.  Please Note:  You might notice some irritation and congestion in your nose or some drainage.  This is from the oxygen used during your procedure.  There is no need for concern and it should clear up in a day or so.  SYMPTOMS TO REPORT IMMEDIATELY:   Following lower endoscopy (colonoscopy or flexible sigmoidoscopy):  Excessive amounts of blood in the stool  Significant tenderness or worsening of abdominal pains  Swelling of the abdomen that is new, acute  Fever of 100F or higher    For urgent or emergent issues, a gastroenterologist can be reached at any hour by calling [phone].   DIET: Your first meal following the procedure should be a small meal and then it is ok to progress to your normal diet. Heavy or fried foods are harder to digest and may make you feel nauseous or bloated.  Likewise, meals heavy in dairy and vegetables can increase bloating.  Drink plenty of fluids but you should avoid alcoholic beverages for 24  hours.  ACTIVITY:  You should plan to take it easy for the rest of today and you should NOT DRIVE or use heavy machinery until tomorrow (because of the sedation medicines used during the test).    FOLLOW UP: Our staff will call the number listed on your records the next business day following your procedure to check on you and address any questions or concerns that you may have regarding the information given to you following your procedure. If we do not reach you, we will leave a message.  However, if you are feeling well and you are not experiencing any problems, there is no need to return our call.  We will assume that you have returned to your regular daily activities without incident.  If any biopsies were taken you will be contacted by phone or by letter within the next 1-3 weeks.  Please call us at [phone] if you have not heard about the biopsies in 3 weeks.    SIGNATURES/CONFIDENTIALITY: You and/or your care partner have signed paperwork which will be entered into your electronic medical record.  These signatures attest to the fact that that the information above on your After Visit Summary has been reviewed and is understood.  Full responsibility of the confidentiality of this discharge information lies with you and/or your care-partner   Information on polyps and hemorrhoids given to you today        

## 2015-07-17 NOTE — Op Note (Signed)
[facility] Patient Name: Jeffrey Hardy Procedure Date: 07/17/2015 1:59 PM MRN: [id number] Endoscopist: Docia Chuck. Henrene Pastor , MD Age: 80 Date of Birth: [birth year] Gender: Male Procedure:                Colonoscopy with snare polypectomy -1 Indications:              Screening for colorectal malignant neoplasm. Index                            examination 2007 was negative Medicines:                Monitored Anesthesia Care Procedure:                Pre-Anesthesia Assessment:                           - Prior to the procedure, a History and Physical                            was performed, and patient medications and                            allergies were reviewed. The patient's tolerance of                            previous anesthesia was also reviewed. The risks                            and benefits of the procedure and the sedation                            options and risks were discussed with the patient.                            All questions were answered, and informed consent                            was obtained. Prior Anticoagulants: The patient has                            taken dipyridamole, last dose was day of procedure.                            ASA Grade Assessment: III - A patient with severe                            systemic disease. After reviewing the risks and                            benefits, the patient was deemed in satisfactory                            condition to undergo the procedure.  After obtaining informed consent, the colonoscope                            was passed under direct vision. Throughout the                            procedure, the patient's blood pressure, pulse, and                            oxygen saturations were monitored continuously. The                            Model CF-HQ190L [id number]) scope was introduced                            through the anus and advanced to the the  cecum,                            identified by appendiceal orifice and ileocecal                            valve. The colonoscopy was performed without                            difficulty. The patient tolerated the procedure                            well. The quality of the bowel preparation was                            good. The bowel preparation used was SUPREP. The                            ileocecal valve, appendiceal orifice, and rectum                            were photographed. Scope In: 2:09:36 PM Scope Out: 2:23:55 PM Scope Withdrawal Time: 0 hours 11 minutes 32 seconds  Total Procedure Duration: 0 hours 14 minutes 19 seconds  Findings:                 The digital rectal exam was normal.                           A 4 mm polyp was found in the ascending colon. The                            polyp was sessile. The polyp was removed with a                            cold snare. Resection and retrieval were complete.                           Internal hemorrhoids were found during retroflexion.  The exam was otherwise without abnormality on                            direct and retroflexion views. Complications:            No immediate complications. Estimated Blood Loss:     Estimated blood loss: none. Impression:               - One 4 mm polyp in the ascending colon, removed                            with a cold snare. Resected and retrieved.                           - Internal hemorrhoids.                           - The examination was otherwise normal on direct                            and retroflexion views. Recommendation:           - Patient has a contact number available for                            emergencies. The signs and symptoms of potential                            delayed complications were discussed with the                            patient. Return to normal activities tomorrow.                            Written  discharge instructions were provided to the                            patient.                           - Resume previous diet.                           - Continue present medications.                           - Await pathology results.                           - No repeat colonoscopy due to age. Docia Chuck. Henrene Pastor, MD 07/17/2015 2:29:45 PM This report has been signed electronically. CC Letter to:             Biagio Borg

## 2015-07-18 ENCOUNTER — Telehealth: Payer: Self-pay | Admitting: *Deleted

## 2015-07-18 NOTE — Telephone Encounter (Signed)
  Follow up Call-  Call back number 07/17/2015 09/16/2014  Post procedure Call Back phone  # [phone]  Permission to leave phone message Yes Yes     Patient questions:  Do you have a fever, pain , or abdominal swelling? No. Pain Score  0 *  Have you tolerated food without any problems? Yes.    Have you been able to return to your normal activities? Yes.    Do you have any questions about your discharge instructions: Diet   No. Medications  No. Follow up visit  No.  Do you have questions or concerns about your Care? No.  Actions: * If pain score is 4 or above: No action needed, pain <4.

## 2015-07-21 NOTE — Telephone Encounter (Signed)
After further review, Dr. [name] recommended that patient schedule an appointment with the AF clinic to discuss [facility] for atrial fibrillation on his ILR.  Patient is agreeable.  Will make scheduler aware.

## 2015-07-24 ENCOUNTER — Encounter (HOSPITAL_COMMUNITY): Payer: Self-pay | Admitting: Nurse Practitioner

## 2015-07-24 ENCOUNTER — Other Ambulatory Visit: Payer: Self-pay

## 2015-07-24 ENCOUNTER — Ambulatory Visit (HOSPITAL_COMMUNITY)
Admission: RE | Admit: 2015-07-24 | Discharge: 2015-07-24 | Disposition: A | Discharge status: Home / Self Care | Payer: Medicare Other | Source: Ambulatory Visit | Attending: Nurse Practitioner | Admitting: Nurse Practitioner

## 2015-07-24 VITALS — BP 118/70 | HR 98 | Ht 73.0 in | Wt 200.0 lb

## 2015-07-24 DIAGNOSIS — Z79899 Other long term (current) drug therapy: Secondary | ICD-10-CM | POA: Insufficient documentation

## 2015-07-24 DIAGNOSIS — Z8249 Family history of ischemic heart disease and other diseases of the circulatory system: Secondary | ICD-10-CM | POA: Insufficient documentation

## 2015-07-24 DIAGNOSIS — H9190 Unspecified hearing loss, unspecified ear: Secondary | ICD-10-CM | POA: Insufficient documentation

## 2015-07-24 DIAGNOSIS — I10 Essential (primary) hypertension: Secondary | ICD-10-CM | POA: Diagnosis not present

## 2015-07-24 DIAGNOSIS — Z7902 Long term (current) use of antithrombotics/antiplatelets: Secondary | ICD-10-CM | POA: Insufficient documentation

## 2015-07-24 DIAGNOSIS — Z8673 Personal history of transient ischemic attack (TIA), and cerebral infarction without residual deficits: Secondary | ICD-10-CM | POA: Insufficient documentation

## 2015-07-24 DIAGNOSIS — E785 Hyperlipidemia, unspecified: Secondary | ICD-10-CM | POA: Diagnosis not present

## 2015-07-24 DIAGNOSIS — I480 Paroxysmal atrial fibrillation: Secondary | ICD-10-CM | POA: Diagnosis present

## 2015-07-24 DIAGNOSIS — K219 Gastro-esophageal reflux disease without esophagitis: Secondary | ICD-10-CM | POA: Insufficient documentation

## 2015-07-24 DIAGNOSIS — Z87891 Personal history of nicotine dependence: Secondary | ICD-10-CM | POA: Insufficient documentation

## 2015-07-24 NOTE — Progress Notes (Signed)
Patient ID: Jeffrey Hardy, male   DOB: 15-Dec-1935, 80 y.o.   MRN: [id number]     Primary Care Physician: Cathlean Cower, MD Referring Physician: Dr. Jolyn Nap   Jeffrey Hardy is a 80 y.o. male with a h/o cryptogenic stroke  9/13. He had a Linq monitor implanted in 2014. He has been on aggrenox since his stroke in 2013. His linq recently showed afib and he was sent here by Dr. [name] to discuss anticoagulation. He is not aware of irregular heart beat. His chadsvasc score is 5.  Today, he denies symptoms of palpitations, chest pain, shortness of breath, orthopnea, PND, lower extremity edema, dizziness, presyncope, syncope, or neurologic sequela. The patient is tolerating medications without difficulties and is otherwise without complaint today.   Past Medical History  Diagnosis Date  . Hypertension   . Erectile dysfunction   . Hyperlipidemia   . Anxiety   . Depression   . GERD (gastroesophageal reflux disease)   . Diverticula, colon   . Syncope     History  . Mitral regurgitation   . Peptic ulcer disease     with Duodenal and Antral ulcers  . H. pylori infection   . HYPERLIPIDEMIA 05/12/2008  . ANXIETY 05/12/2008  . DEPRESSION 05/12/2008  . HYPERTENSION 05/12/2008  . GERD 05/12/2008  . PEPTIC ULCER DISEASE 05/12/2008  . DIVERTICULOSIS, COLON 05/12/2008  . OVERACTIVE BLADDER 05/12/2008  . Fracture of knee region 1974    with residual weakness  . History of stroke 12/18/2011  . Stroke ([place])   . Hearing loss    Past Surgical History  Procedure Laterality Date  . Appendectomy    . Tee without cardioversion  12/19/2011    Procedure: TRANSESOPHAGEAL ECHOCARDIOGRAM (TEE);  Surgeon: [name], MD;  Location: [facility] ENDOSCOPY;  Service: Cardiovascular;  Laterality: N/A;  . Loop recorder implant N/A 07/06/2012    Procedure: LOOP RECORDER IMPLANT;  Surgeon: [name], MD;  Location: [facility] CATH LAB;  Service: Cardiovascular;  Laterality: N/A;  . Wrist fracture surgery Right     Current Outpatient  Prescriptions  Medication Sig Dispense Refill  . Cholecalciferol 1000 UNITS capsule Take 2,000 Units by mouth daily.    [name] dipyridamole-aspirin (AGGRENOX) 200-25 MG 12hr capsule Take 1 capsule by mouth 2 (two) times daily. 180 capsule 3  . lisinopril (PRINIVIL,ZESTRIL) 5 MG tablet TAKE ONE (1) TABLET EACH DAY 90 tablet 3  . omeprazole (PRILOSEC) 20 MG capsule Take 1 capsule (20 mg total) by mouth daily. Take one pill on an empty stomach, 20 - 30 minutes prior to breakfast. 90 capsule 3  . tamsulosin (FLOMAX) 0.4 MG CAPS capsule Take 0.4 mg by mouth daily after breakfast.     No current facility-administered medications for this encounter.    No Known Allergies  Social History   Social History  . Marital Status: Married    Spouse Name: N/A  . Number of Children: 2  . Years of Education: N/A   Occupational History  . retired    Social History Main Topics  . Smoking status: Former Smoker    Types: Cigarettes, Pipe    Quit date: 09/07/1966  . Smokeless tobacco: Never Used  . Alcohol Use: No  . Drug Use: No  . Sexual Activity: Not on file   Other Topics Concern  . 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 Patient is retired from [employer]    Family History  Problem Relation Age of Onset  . Parkinsonism Brother   . Dementia Brother   . Heart Problems Mother   . Brain cancer Father   . Colon cancer Neg Hx   . Esophageal cancer Neg Hx   . Stomach cancer Neg Hx   . Gastric cancer Neg Hx   . Liver disease Neg Hx   . Kidney disease Neg Hx   . Diabetes Neg Hx     ROS- All systems are reviewed and negative except as per the HPI above  Physical Exam: Filed Vitals:   07/24/15 0905  BP: 118/70  Pulse: 98  Height: 6\' 1"  (1.854 m)  Weight: 200 lb (90.719 kg)    GEN- The patient is well appearing, alert and oriented x 3 today.   Head- normocephalic, atraumatic Eyes-  Sclera clear, conjunctiva pink Ears- hearing intact Oropharynx-  clear Neck- supple, no JVP Lymph- no cervical lymphadenopathy Lungs- Clear to ausculation bilaterally, normal work of breathing Heart- Regular rate and rhythm, no murmurs, rubs or gallops, PMI not laterally displaced GI- soft, NT, ND, + BS Extremities- no clubbing, cyanosis, or edema MS- no significant deformity or atrophy Skin- no rash or lesion Psych- euthymic mood, full affect Neuro- strength and sensation are intact  EKG- NSR, normal EKG, pr int 148 ms, qrs int 90 ms, Qtc 441 ms Linq report showing afib reviewed Epic records reviewed  Assessment and Plan: 1. PAF With h/o cyrptogenic stroke, chadsvasc score of 5 Per Dr. [name], reasonable to start blood thinner with PAF noted on monitor Coumadin, xarelto, eliquis discussed with pt definitely not wanting coumadin He will check with insurance company and let me know which DOAC will be less expensive for him Risks vrs benefit of blood thinners discussed He will stop probably stop aggrenox with addition of DOAC, but will message PCP to make aware. He currently denies any bleeding risk, remote h/o peptic ulcers but did not actively bleed Steady on his feet, no falls  F/u one month  Butch Penny C. Soumya Colson, [facility] [address] [place], [place] [address] [phone]

## 2015-07-25 ENCOUNTER — Encounter: Payer: Self-pay | Admitting: Internal Medicine

## 2015-07-26 ENCOUNTER — Telehealth (HOSPITAL_COMMUNITY): Payer: Self-pay | Admitting: Nurse Practitioner

## 2015-07-26 NOTE — Telephone Encounter (Signed)
Pt 's wife was to check with insurance company to find out most affordable DOAC, xarelto vrs eliquis and get to me the next day. I have not heard back. I left a message x2 to return call to afib clinic to let me know their decision so it can be started. Aggrenox will need to be stopped at time of starting.

## 2015-07-27 ENCOUNTER — Other Ambulatory Visit (HOSPITAL_COMMUNITY): Payer: Self-pay | Admitting: *Deleted

## 2015-07-27 MED ORDER — APIXABAN 5 MG PO TABS: 5.0000 mg | ORAL_TABLET | Freq: Two times a day (BID) | ORAL | Status: DC

## 2015-08-29 ENCOUNTER — Encounter (HOSPITAL_COMMUNITY): Payer: Self-pay | Admitting: Nurse Practitioner

## 2015-08-29 ENCOUNTER — Ambulatory Visit (HOSPITAL_COMMUNITY)
Admission: RE | Admit: 2015-08-29 | Discharge: 2015-08-29 | Disposition: A | Discharge status: Home / Self Care | Payer: Medicare Other | Source: Ambulatory Visit | Attending: Nurse Practitioner | Admitting: Nurse Practitioner

## 2015-08-29 VITALS — BP 122/70 | HR 56 | Ht 73.0 in | Wt 197.0 lb

## 2015-08-29 DIAGNOSIS — I10 Essential (primary) hypertension: Secondary | ICD-10-CM | POA: Diagnosis not present

## 2015-08-29 DIAGNOSIS — Z818 Family history of other mental and behavioral disorders: Secondary | ICD-10-CM | POA: Insufficient documentation

## 2015-08-29 DIAGNOSIS — Z800 Family history of malignant neoplasm of digestive organs: Secondary | ICD-10-CM | POA: Diagnosis not present

## 2015-08-29 DIAGNOSIS — N3281 Overactive bladder: Secondary | ICD-10-CM | POA: Insufficient documentation

## 2015-08-29 DIAGNOSIS — Z87891 Personal history of nicotine dependence: Secondary | ICD-10-CM | POA: Insufficient documentation

## 2015-08-29 DIAGNOSIS — I340 Nonrheumatic mitral (valve) insufficiency: Secondary | ICD-10-CM | POA: Diagnosis not present

## 2015-08-29 DIAGNOSIS — Z8051 Family history of malignant neoplasm of kidney: Secondary | ICD-10-CM | POA: Insufficient documentation

## 2015-08-29 DIAGNOSIS — E785 Hyperlipidemia, unspecified: Secondary | ICD-10-CM | POA: Insufficient documentation

## 2015-08-29 DIAGNOSIS — I4891 Unspecified atrial fibrillation: Secondary | ICD-10-CM | POA: Diagnosis present

## 2015-08-29 DIAGNOSIS — I480 Paroxysmal atrial fibrillation: Secondary | ICD-10-CM | POA: Diagnosis not present

## 2015-08-29 DIAGNOSIS — F419 Anxiety disorder, unspecified: Secondary | ICD-10-CM | POA: Insufficient documentation

## 2015-08-29 DIAGNOSIS — H9190 Unspecified hearing loss, unspecified ear: Secondary | ICD-10-CM | POA: Insufficient documentation

## 2015-08-29 DIAGNOSIS — Z808 Family history of malignant neoplasm of other organs or systems: Secondary | ICD-10-CM | POA: Diagnosis not present

## 2015-08-29 DIAGNOSIS — Z79899 Other long term (current) drug therapy: Secondary | ICD-10-CM | POA: Diagnosis not present

## 2015-08-29 DIAGNOSIS — Z7901 Long term (current) use of anticoagulants: Secondary | ICD-10-CM | POA: Insufficient documentation

## 2015-08-29 DIAGNOSIS — Z8673 Personal history of transient ischemic attack (TIA), and cerebral infarction without residual deficits: Secondary | ICD-10-CM | POA: Insufficient documentation

## 2015-08-29 DIAGNOSIS — K219 Gastro-esophageal reflux disease without esophagitis: Secondary | ICD-10-CM | POA: Insufficient documentation

## 2015-08-29 DIAGNOSIS — Z9889 Other specified postprocedural states: Secondary | ICD-10-CM | POA: Insufficient documentation

## 2015-08-29 DIAGNOSIS — R001 Bradycardia, unspecified: Secondary | ICD-10-CM | POA: Diagnosis not present

## 2015-08-29 DIAGNOSIS — B9681 Helicobacter pylori [H. pylori] as the cause of diseases classified elsewhere: Secondary | ICD-10-CM | POA: Diagnosis not present

## 2015-08-29 DIAGNOSIS — F329 Major depressive disorder, single episode, unspecified: Secondary | ICD-10-CM | POA: Diagnosis not present

## 2015-08-29 LAB — CBC
HCT: 45.8 % (ref 39.0–52.0)
Hemoglobin: 15 g/dL (ref 13.0–17.0)
MCH: 32.1 pg (ref 26.0–34.0)
MCHC: 32.8 g/dL (ref 30.0–36.0)
MCV: 98.1 fL (ref 78.0–100.0)
Platelets: 145 10*3/uL — ABNORMAL LOW (ref 150–400)
RBC: 4.67 MIL/uL (ref 4.22–5.81)
RDW: 12.9 % (ref 11.5–15.5)
WBC: 4.1 10*3/uL (ref 4.0–10.5)

## 2015-08-29 NOTE — Progress Notes (Signed)
Patient ID: Jeffrey Hardy, male   DOB: [birth year], 80 y.o.   MRN: [id number]     Primary Care Physician: Cathlean Cower, MD Referring Physician: Dr. Jolyn Nap   Jeffrey Hardy is a 80 y.o. male with a h/o cryptogenic stroke  9/13. He had a Linq monitor implanted in 2014. He has been on aggrenox since his stroke in 2013. His linq recently showed afib and he was sent here by Dr. [name] to discuss anticoagulation. He is not aware of irregular heart beat. His chadsvasc score is 5.He was started on eliquis and aggrenox was stopped.  He returns to the afib clinic 5/23. No issues with eliquis, no bleeding issues. Not aware of any irregular heart beat.   Today, he denies symptoms of palpitations, chest pain, shortness of breath, orthopnea, PND, lower extremity edema, dizziness, presyncope, syncope, or neurologic sequela. The patient is tolerating medications without difficulties and is otherwise without complaint today.   Past Medical History  Diagnosis Date  . Hypertension   . Erectile dysfunction   . Hyperlipidemia   . Anxiety   . Depression   . GERD (gastroesophageal reflux disease)   . Diverticula, colon   . Syncope     History  . Mitral regurgitation   . Peptic ulcer disease     with Duodenal and Antral ulcers  . H. pylori infection   . HYPERLIPIDEMIA 05/12/2008  . ANXIETY 05/12/2008  . DEPRESSION 05/12/2008  . HYPERTENSION 05/12/2008  . GERD 05/12/2008  . PEPTIC ULCER DISEASE 05/12/2008  . DIVERTICULOSIS, COLON 05/12/2008  . OVERACTIVE BLADDER 05/12/2008  . Fracture of knee region 1974    with residual weakness  . History of stroke 12/18/2011  . Stroke ([place])   . Hearing loss    Past Surgical History  Procedure Laterality Date  . Appendectomy    . Tee without cardioversion  12/19/2011    Procedure: TRANSESOPHAGEAL ECHOCARDIOGRAM (TEE);  Surgeon: [name], MD;  Location: [facility] ENDOSCOPY;  Service: Cardiovascular;  Laterality: N/A;  . Loop recorder implant N/A 07/06/2012    Procedure: LOOP  RECORDER IMPLANT;  Surgeon: [name], MD;  Location: [facility] CATH LAB;  Service: Cardiovascular;  Laterality: N/A;  . Wrist fracture surgery Right     Current Outpatient Prescriptions  Medication Sig Dispense Refill  . apixaban (ELIQUIS) 5 MG TABS tablet Take 1 tablet (5 mg total) by mouth 2 (two) times daily. 60 tablet 6  . Cholecalciferol 1000 UNITS capsule Take 2,000 Units by mouth daily.    [name] lisinopril (PRINIVIL,ZESTRIL) 5 MG tablet TAKE ONE (1) TABLET EACH DAY 90 tablet 3  . omeprazole (PRILOSEC) 20 MG capsule Take 1 capsule (20 mg total) by mouth daily. Take one pill on an empty stomach, 20 - 30 minutes prior to breakfast. 90 capsule 3  . tamsulosin (FLOMAX) 0.4 MG CAPS capsule Take 0.4 mg by mouth daily after breakfast.     No current facility-administered medications for this encounter.    No Known Allergies  Social History   Social History  . Marital Status: Married    Spouse Name: N/A  . Number of Children: 2  . Years of Education: N/A   Occupational History  . retired    Social History Main Topics  . Smoking status: Former Smoker    Types: Cigarettes, Pipe    Quit date: 09/07/1966  . Smokeless tobacco: Never Used  . Alcohol Use: No  . Drug Use: No  . Sexual Activity: Not on file  Other Topics Concern  . 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 Patient is retired from [employer]    Family History  Problem Relation Age of Onset  . Parkinsonism Brother   . Dementia Brother   . Heart Problems Mother   . Brain cancer Father   . Colon cancer Neg Hx   . Esophageal cancer Neg Hx   . Stomach cancer Neg Hx   . Gastric cancer Neg Hx   . Liver disease Neg Hx   . Kidney disease Neg Hx   . Diabetes Neg Hx     ROS- All systems are reviewed and negative except as per the HPI above  Physical Exam: Filed Vitals:   08/29/15 1016  BP: 122/70  Pulse: 56  Height: 6\' 1"  (1.854 m)  Weight: 197 lb (89.359 kg)    GEN- The patient  is well appearing, alert and oriented x 3 today.   Head- normocephalic, atraumatic Eyes-  Sclera clear, conjunctiva pink Ears- hearing intact Oropharynx- clear Neck- supple, no JVP Lymph- no cervical lymphadenopathy Lungs- Clear to ausculation bilaterally, normal work of breathing Heart- Regular rate and rhythm, no murmurs, rubs or gallops, PMI not laterally displaced GI- soft, NT, ND, + BS Extremities- no clubbing, cyanosis, or edema MS- no significant deformity or atrophy Skin- no rash or lesion Psych- euthymic mood, full affect Neuro- strength and sensation are intact  EKG- NSR, normal EKG, pr int 148 ms, qrs int 90 ms, Qtc 441 ms Linq report showing afib reviewed Epic records reviewed  Assessment and Plan: 1. PAF With h/o cyrptogenic stroke, chadsvasc score of 5 Per Dr. [name], reasonable to start blood thinner with PAF noted on monitor Continue eliquis 5 mg bid and is off aggrenox. No issues with irregular heart beat or bleeding starting eliquis Cbc today with change in blood thinner  F/u with with Dr. [name] 8/17 afib clinic as needed  Jeffrey Hardy C. Jeffrey Hardy, [facility] [address] [place],  [address] [phone]

## 2015-11-09 ENCOUNTER — Ambulatory Visit (INDEPENDENT_AMBULATORY_CARE_PROVIDER_SITE_OTHER): Payer: Medicare Other | Admitting: Internal Medicine

## 2015-11-09 ENCOUNTER — Encounter: Payer: Self-pay | Admitting: Internal Medicine

## 2015-11-09 VITALS — BP 132/75 | HR 57 | Ht 74.0 in | Wt 197.4 lb

## 2015-11-09 DIAGNOSIS — I480 Paroxysmal atrial fibrillation: Secondary | ICD-10-CM

## 2015-11-09 DIAGNOSIS — I639 Cerebral infarction, unspecified: Secondary | ICD-10-CM

## 2015-11-09 NOTE — Progress Notes (Signed)
Patient Care Team: Biagio Borg, MD as PCP - General   HPI  Jeffrey Hardy is a 80 y.o. male Seen in followup for a cryptogenic stroke for which he underwent loop recorder insertion 3/14   He has recovered fully.  His monitor demonstrated atrial fibrillation. He was seen by DC-NP and started on ELIQUIS. Aggrenox was discontinued. He was seen in follow-up tolerating his anticoagulation   Biggest complaint is fatigue   Denies sleep disordered breathing is   The patient denies chest pain, shortness of breath, nocturnal dyspnea, orthopnea or peripheral edema.  There have been no palpitations, lightheadedness or syncope.    Echo 2013 demonstrated normal left ventricular function and normal left atrial dimensions (30/1.3)  Past Medical History:  Diagnosis Date  . Anxiety   . ANXIETY 05/12/2008  . Depression   . DEPRESSION 05/12/2008  . Diverticula, colon   . DIVERTICULOSIS, COLON 05/12/2008  . Erectile dysfunction   . Fracture of knee region 1974   with residual weakness  . GERD 05/12/2008  . GERD (gastroesophageal reflux disease)   . H. pylori infection   . Hearing loss   . History of stroke 12/18/2011  . Hyperlipidemia   . HYPERLIPIDEMIA 05/12/2008  . Hypertension   . HYPERTENSION 05/12/2008  . Mitral regurgitation   . OVERACTIVE BLADDER 05/12/2008  . Peptic ulcer disease    with Duodenal and Antral ulcers  . PEPTIC ULCER DISEASE 05/12/2008  . Stroke ([place])   . Syncope    History    Past Surgical History:  Procedure Laterality Date  . APPENDECTOMY    . LOOP RECORDER IMPLANT N/A 07/06/2012   Procedure: LOOP RECORDER IMPLANT;  Surgeon: [name], MD;  Location: [facility] CATH LAB;  Service: Cardiovascular;  Laterality: N/A;  . TEE WITHOUT CARDIOVERSION  12/19/2011   Procedure: TRANSESOPHAGEAL ECHOCARDIOGRAM (TEE);  Surgeon: [name], MD;  Location: [facility] ENDOSCOPY;  Service: Cardiovascular;  Laterality: N/A;  . WRIST FRACTURE SURGERY Right     Current Outpatient  Prescriptions  Medication Sig Dispense Refill  . apixaban (ELIQUIS) 5 MG TABS tablet Take 1 tablet (5 mg total) by mouth 2 (two) times daily. 60 tablet 6  . lisinopril (PRINIVIL,ZESTRIL) 5 MG tablet TAKE ONE (1) TABLET EACH DAY 90 tablet 3  . tamsulosin (FLOMAX) 0.4 MG CAPS capsule Take 0.4 mg by mouth daily after breakfast.     No current facility-administered medications for this visit.     No Known Allergies  Review of Systems negative except from HPI and PMH  Physical Exam BP 132/75   Pulse (!) 57   Ht 6\' 2"  (1.88 m)   Wt 197 lb 6.4 oz (89.5 kg)   SpO2 97%   BMI 25.34 kg/m  Well developed and well nourished in no acute distress HENT normal E scleral and icterus clear Neck Supple JVP flat; carotids brisk and full Clear to ausculation Device pocket well healed; without hematoma or erythema.  There is no tethering Regular rate and rhythm, no murmurs gallops or rub Soft with active bowel sounds No clubbing cyanosis  Edema Alert and oriented, grossly normal motor and sensory function Skin Warm and Dry    Loop recorder interrogation demonstrates  no atrial arrhythmias     Assessment and  Plan  Cryptogenic stroke  Atrial fibrillation   Ventricular tachycardia-nonsustained  Implantable loop recorder-LINQ at eri  Fatigue  No clear cause of fatigue from cardiovascular vantage.  I wonder if OSA mY BE  CONTRIBUTING     He will stay on apixoban. We'll see him again in 6 months time. He has elected to leave his loop recorder in place. No further follow-up with his monitor.

## 2015-11-09 NOTE — Patient Instructions (Signed)
Medication Instructions: Your physician recommends that you continue on your current medications as directed. Please refer to the Current Medication list given to you today.   Labwork: NONE  Procedures/Testing: NONE  Follow-Up: Your physician recommends that you schedule a follow-up appointment in: [place]   Any Additional Special Instructions Will Be Listed Below (If Applicable).     If you need a refill on your cardiac medications before your next appointment, please call your pharmacy.

## 2015-11-10 ENCOUNTER — Encounter: Payer: Self-pay | Admitting: Internal Medicine

## 2016-03-08 ENCOUNTER — Other Ambulatory Visit: Payer: Self-pay | Admitting: Internal Medicine

## 2016-03-08 ENCOUNTER — Other Ambulatory Visit (INDEPENDENT_AMBULATORY_CARE_PROVIDER_SITE_OTHER): Payer: Medicare Other

## 2016-03-08 ENCOUNTER — Telehealth: Payer: Self-pay | Admitting: Emergency Medicine

## 2016-03-08 ENCOUNTER — Encounter: Payer: Self-pay | Admitting: Internal Medicine

## 2016-03-08 ENCOUNTER — Ambulatory Visit (INDEPENDENT_AMBULATORY_CARE_PROVIDER_SITE_OTHER): Payer: Medicare Other | Admitting: Internal Medicine

## 2016-03-08 VITALS — BP 138/78 | HR 84 | Resp 20 | Wt 198.2 lb

## 2016-03-08 DIAGNOSIS — N179 Acute kidney failure, unspecified: Secondary | ICD-10-CM | POA: Diagnosis not present

## 2016-03-08 DIAGNOSIS — I672 Cerebral atherosclerosis: Secondary | ICD-10-CM | POA: Diagnosis not present

## 2016-03-08 DIAGNOSIS — Z0001 Encounter for general adult medical examination with abnormal findings: Secondary | ICD-10-CM

## 2016-03-08 DIAGNOSIS — R300 Dysuria: Secondary | ICD-10-CM

## 2016-03-08 DIAGNOSIS — R413 Other amnesia: Secondary | ICD-10-CM

## 2016-03-08 DIAGNOSIS — F0390 Unspecified dementia without behavioral disturbance: Secondary | ICD-10-CM | POA: Insufficient documentation

## 2016-03-08 DIAGNOSIS — R350 Frequency of micturition: Secondary | ICD-10-CM

## 2016-03-08 DIAGNOSIS — Z8673 Personal history of transient ischemic attack (TIA), and cerebral infarction without residual deficits: Secondary | ICD-10-CM

## 2016-03-08 LAB — URINALYSIS, ROUTINE W REFLEX MICROSCOPIC
Bilirubin Urine: NEGATIVE
Nitrite: POSITIVE — AB
Specific Gravity, Urine: 1.02 (ref 1.000–1.030)
Total Protein, Urine: >=300 — AB
Urine Glucose: NEGATIVE
Urobilinogen, UA: 1 (ref 0.0–1.0)
pH: 7 (ref 5.0–8.0)

## 2016-03-08 LAB — BASIC METABOLIC PANEL
BUN: 21 mg/dL (ref 6–23)
CO2: 29 mEq/L (ref 19–32)
Calcium: 9.8 mg/dL (ref 8.4–10.5)
Chloride: 103 mEq/L (ref 96–112)
Creatinine, Ser: 1.25 mg/dL (ref 0.40–1.50)
GFR: 59.37 mL/min — ABNORMAL LOW (ref >60.00)
Glucose, Bld: 100 mg/dL — ABNORMAL HIGH (ref 70–99)
Potassium: 4.3 mEq/L (ref 3.5–5.1)
Sodium: 140 mEq/L (ref 135–145)

## 2016-03-08 MED ORDER — LEVOFLOXACIN 250 MG PO TABS: 250.0000 mg | ORAL_TABLET | Freq: Every day | ORAL | 0 refills | Status: AC

## 2016-03-08 NOTE — Telephone Encounter (Signed)
Patients wife called and stated he husband went down stairs for labs and urine culture. When he urinated he had blood in his urine. Pt told his wife it was a lot of blood and that it got on the toilet and in the urine sample. Patient is wondering what to do and if they should wait around or go home. If this continues to happen since the weekend is coming up do they need to go to the ER or wait to hear back from the office? Please advise thanks.

## 2016-03-08 NOTE — Telephone Encounter (Signed)
Ok to hold the eilquis for at least 1 day  We need to await the results of the urine testing and culture  Only restart the eliquis when the bleeding seemed to have stopped - could be 2-3 days  Call on Mon Dec 4 if still bleeding for a blood count and likely urology referral

## 2016-03-08 NOTE — Progress Notes (Signed)
Subjective:    Patient ID: Jeffrey Hardy, male    DOB: 1935/10/22, 80 y.o.   MRN: [id number]  HPI  Here to f/u after lab c/w Cr 1.42 with recent labs per [facility] study; incidentally pt today with several days urinary freq and 2x/night nocturia, but Denies urinary symptoms such as dysuria, urgency, flank pain, hematuria or n/v, fever, chills. Sees Dr McDermott/urology once yearly for prostate, but not for several months.  On eliquis but no bleeding.  Pt denies chest pain, increased sob or doe, wheezing, orthopnea, PND, increased LE swelling, palpitations, dizziness or syncope.  Pt denies new neurological symptoms such as new headache, or facial or extremity weakness or numbness  Dementia overall stable symptomatically, and not assoc with behavioral changes such as hallucinations, paranoia, or agitation. Past Medical History:  Diagnosis Date  . Anxiety   . ANXIETY 05/12/2008  . Depression   . DEPRESSION 05/12/2008  . Diverticula, colon   . DIVERTICULOSIS, COLON 05/12/2008  . Erectile dysfunction   . Fracture of knee region 1974   with residual weakness  . GERD 05/12/2008  . GERD (gastroesophageal reflux disease)   . H. pylori infection   . Hearing loss   . History of stroke 12/18/2011  . Hyperlipidemia   . HYPERLIPIDEMIA 05/12/2008  . Hypertension   . HYPERTENSION 05/12/2008  . Mitral regurgitation   . OVERACTIVE BLADDER 05/12/2008  . Peptic ulcer disease    with Duodenal and Antral ulcers  . PEPTIC ULCER DISEASE 05/12/2008  . Stroke ([place])   . Syncope    History   Past Surgical History:  Procedure Laterality Date  . APPENDECTOMY    . LOOP RECORDER IMPLANT N/A 07/06/2012   Procedure: LOOP RECORDER IMPLANT;  Surgeon: [name], MD;  Location: [facility] CATH LAB;  Service: Cardiovascular;  Laterality: N/A;  . TEE WITHOUT CARDIOVERSION  12/19/2011   Procedure: TRANSESOPHAGEAL ECHOCARDIOGRAM (TEE);  Surgeon: [name], MD;  Location: [place];  Service: Cardiovascular;  Laterality: N/A;    . WRIST FRACTURE SURGERY Right     reports that he quit smoking about 49 years ago. His smoking use included Cigarettes and Pipe. He has never used smokeless tobacco. He reports that he does not drink alcohol or use drugs. family history includes Brain cancer in his father; Dementia in his brother; Heart Problems in his mother; Parkinsonism in his brother. No Known Allergies Current Outpatient Prescriptions on File Prior to Visit  Medication Sig Dispense Refill  . apixaban (ELIQUIS) 5 MG TABS tablet Take 1 tablet (5 mg total) by mouth 2 (two) times daily. 60 tablet 6  . lisinopril (PRINIVIL,ZESTRIL) 5 MG tablet TAKE ONE (1) TABLET EACH DAY 90 tablet 3  . tamsulosin (FLOMAX) 0.4 MG CAPS capsule Take 0.4 mg by mouth daily after breakfast.     No current facility-administered medications on file prior to visit.    Review of Systems  Constitutional: Negative for unusual diaphoresis or night sweats HENT: Negative for ear swelling or discharge Eyes: Negative for worsening visual haziness  Respiratory: Negative for choking and stridor.   Gastrointestinal: Negative for distension or worsening eructation Genitourinary: Negative for retention or change in urine volume.  Musculoskeletal: Negative for other MSK pain or swelling Skin: Negative for color change and worsening wound Neurological: Negative for tremors and numbness other than noted  Psychiatric/Behavioral: Negative for decreased concentration or agitation other than above   All other system neg per pt    Objective:   Physical Exam  BP 138/78   Pulse 84   Resp 20   Wt 198 lb 4 oz (89.9 kg)   SpO2 98%   BMI 25.45 kg/m  VS noted, non toxic appaering, smiling/joking Constitutional: Pt appears in no apparent distress HENT: Head: NCAT.  Right Ear: External ear normal.  Left Ear: External ear normal.  Eyes: . Pupils are equal, round, and reactive to light. Conjunctivae and EOM are normal Neck: Normal range of motion. Neck supple.   Cardiovascular: Normal rate and regular rhythm.   Pulmonary/Chest: Effort normal and breath sounds without rales or wheezing.  Abd:  Soft, NT, ND, + BS, no flank tender Neurological: Pt is alert. At baseline confused , motor grossly intact Skin: Skin is warm. No rash, no LE edema Psychiatric: Pt behavior is normal. No agitation.  No other new exam findings    Assessment & Plan:

## 2016-03-08 NOTE — Telephone Encounter (Signed)
Patient is aware 

## 2016-03-08 NOTE — Progress Notes (Signed)
Pre visit review using our clinic review tool, if applicable. No additional management support is needed unless otherwise documented below in the visit note. 

## 2016-03-08 NOTE — Patient Instructions (Addendum)
Please continue all other medications as before, and refills have been done if requested.  Please have the pharmacy call with any other refills you may need.  Please continue your efforts at being more active, low cholesterol diet, and weight control.  Please keep your appointments with your specialists as you may have planned - urology next year  You will be contacted regarding the referral for: kidney ultrasound  Please go to the LAB in the Basement (turn left off the elevator) for the tests to be done today  You will be contacted by phone if any changes need to be made immediately.  Otherwise, you will receive a letter about your results with an explanation, but please check with MyChart first.  Please remember to sign up for MyChart if you have not done so, as this will be important to you in the future with finding out test results, communicating by private email, and scheduling acute appointments online when needed.  Please return in 6 months, or sooner if needed, with Lab testing done 3-5 days before

## 2016-03-09 NOTE — Assessment & Plan Note (Signed)
Etiology unclear, doubt low volume state, for f/u lab and renal u/s

## 2016-03-09 NOTE — Assessment & Plan Note (Addendum)
stable overall by history and exam, no new neuro complaints, and pt to continue medical treatment as before,  to f/u any worsening symptoms or concerns

## 2016-03-09 NOTE — Assessment & Plan Note (Addendum)
Etiology unclear, for UA and cx, consdier antibx pending results, f/u urology as planned

## 2016-03-09 NOTE — Assessment & Plan Note (Signed)
With likely vascular related dementia stable per wife, for b12 today

## 2016-03-10 LAB — URINE CULTURE: Organism ID, Bacteria: NO GROWTH

## 2016-03-12 ENCOUNTER — Telehealth: Payer: Self-pay | Admitting: Internal Medicine

## 2016-03-12 NOTE — Telephone Encounter (Signed)
Patient states he had an issue while in to see Dr. Jenny Reichmann on Friday.   Would not tell me what the issue was.  Is requesting call back.

## 2016-03-14 ENCOUNTER — Ambulatory Visit
Admission: RE | Admit: 2016-03-14 | Discharge: 2016-03-14 | Disposition: A | Discharge status: Home / Self Care | Payer: Medicare Other | Source: Ambulatory Visit | Attending: Internal Medicine | Admitting: Internal Medicine

## 2016-03-14 DIAGNOSIS — R413 Other amnesia: Secondary | ICD-10-CM

## 2016-03-14 DIAGNOSIS — N179 Acute kidney failure, unspecified: Secondary | ICD-10-CM

## 2016-03-15 ENCOUNTER — Telehealth: Payer: Self-pay | Admitting: Internal Medicine

## 2016-03-15 DIAGNOSIS — N2889 Other specified disorders of kidney and ureter: Secondary | ICD-10-CM

## 2016-03-15 NOTE — Telephone Encounter (Signed)
Spoke by phone to Mrs Poppen and explained the right renal mass  WIll refer urgently to Urology, as CT may be preferable

## 2016-04-11 DIAGNOSIS — R310 Gross hematuria: Secondary | ICD-10-CM | POA: Diagnosis not present

## 2016-04-12 ENCOUNTER — Telehealth: Payer: Self-pay | Admitting: Internal Medicine

## 2016-04-12 ENCOUNTER — Other Ambulatory Visit: Payer: Self-pay | Admitting: Urology

## 2016-04-12 NOTE — Telephone Encounter (Signed)
[facility] called to get verbal ok for surgery clearance. Per dr Jenny Reichmann, ok given. She is asking that we return surgical clearance form that they faxed in.

## 2016-04-12 NOTE — Telephone Encounter (Signed)
Placed on MD desk waiting for  Signature to fax back over.

## 2016-04-15 ENCOUNTER — Other Ambulatory Visit: Payer: Self-pay | Admitting: Urology

## 2016-04-17 ENCOUNTER — Other Ambulatory Visit: Payer: Self-pay | Admitting: Urology

## 2016-04-17 ENCOUNTER — Encounter (HOSPITAL_BASED_OUTPATIENT_CLINIC_OR_DEPARTMENT_OTHER): Payer: Self-pay | Admitting: *Deleted

## 2016-04-19 ENCOUNTER — Encounter (HOSPITAL_BASED_OUTPATIENT_CLINIC_OR_DEPARTMENT_OTHER): Payer: Self-pay | Admitting: *Deleted

## 2016-04-19 NOTE — Progress Notes (Signed)
NPO AFTER MN.  ARRIVE AT [id number].  NEEDS ISTAT .  CURRENT EKG IN CHART AND EPIC.  WILL TAKE FLOMAX AM DOS W/ SIPS OF WATER.  PT STATED WAS TOLD FOR DR MCKENZIE OFFICE TO STOP ELIQUIS 2 DAYS PRIOR DOS, WHICH IS TODAY.

## 2016-04-22 ENCOUNTER — Encounter (HOSPITAL_BASED_OUTPATIENT_CLINIC_OR_DEPARTMENT_OTHER): Payer: Self-pay | Admitting: *Deleted

## 2016-04-22 ENCOUNTER — Encounter (HOSPITAL_BASED_OUTPATIENT_CLINIC_OR_DEPARTMENT_OTHER)
Admission: RE | Disposition: A | Discharge status: Home / Self Care | Payer: Self-pay | Source: Ambulatory Visit | Attending: Urology

## 2016-04-22 ENCOUNTER — Ambulatory Visit (HOSPITAL_BASED_OUTPATIENT_CLINIC_OR_DEPARTMENT_OTHER): Payer: PPO | Admitting: Anesthesiology

## 2016-04-22 ENCOUNTER — Ambulatory Visit (HOSPITAL_BASED_OUTPATIENT_CLINIC_OR_DEPARTMENT_OTHER)
Admission: RE | Admit: 2016-04-22 | Discharge: 2016-04-22 | Disposition: A | Discharge status: Home / Self Care | Payer: PPO | Source: Ambulatory Visit | Attending: Urology | Admitting: Urology

## 2016-04-22 DIAGNOSIS — Z79899 Other long term (current) drug therapy: Secondary | ICD-10-CM | POA: Diagnosis not present

## 2016-04-22 DIAGNOSIS — Z8673 Personal history of transient ischemic attack (TIA), and cerebral infarction without residual deficits: Secondary | ICD-10-CM | POA: Diagnosis not present

## 2016-04-22 DIAGNOSIS — I480 Paroxysmal atrial fibrillation: Secondary | ICD-10-CM | POA: Diagnosis not present

## 2016-04-22 DIAGNOSIS — R896 Abnormal cytological findings in specimens from other organs, systems and tissues: Secondary | ICD-10-CM | POA: Diagnosis not present

## 2016-04-22 DIAGNOSIS — R310 Gross hematuria: Secondary | ICD-10-CM | POA: Diagnosis not present

## 2016-04-22 DIAGNOSIS — Z7901 Long term (current) use of anticoagulants: Secondary | ICD-10-CM | POA: Insufficient documentation

## 2016-04-22 DIAGNOSIS — E785 Hyperlipidemia, unspecified: Secondary | ICD-10-CM | POA: Diagnosis not present

## 2016-04-22 DIAGNOSIS — D696 Thrombocytopenia, unspecified: Secondary | ICD-10-CM | POA: Diagnosis not present

## 2016-04-22 DIAGNOSIS — I10 Essential (primary) hypertension: Secondary | ICD-10-CM | POA: Insufficient documentation

## 2016-04-22 DIAGNOSIS — D49511 Neoplasm of unspecified behavior of right kidney: Secondary | ICD-10-CM | POA: Diagnosis not present

## 2016-04-22 DIAGNOSIS — Z87891 Personal history of nicotine dependence: Secondary | ICD-10-CM | POA: Insufficient documentation

## 2016-04-22 DIAGNOSIS — C651 Malignant neoplasm of right renal pelvis: Secondary | ICD-10-CM | POA: Diagnosis not present

## 2016-04-22 DIAGNOSIS — N2889 Other specified disorders of kidney and ureter: Secondary | ICD-10-CM | POA: Diagnosis not present

## 2016-04-22 HISTORY — DX: Personal history of transient ischemic attack (TIA), and cerebral infarction without residual deficits: Z86.73

## 2016-04-22 HISTORY — DX: Nocturia: R35.1

## 2016-04-22 HISTORY — DX: Diverticulosis of large intestine without perforation or abscess without bleeding: K57.30

## 2016-04-22 HISTORY — DX: Presence of other cardiac implants and grafts: Z95.818

## 2016-04-22 HISTORY — DX: Personal history of other diseases of the digestive system: Z87.19

## 2016-04-22 HISTORY — DX: Hematuria, unspecified: R31.9

## 2016-04-22 HISTORY — DX: Personal history of other specified conditions: Z87.898

## 2016-04-22 HISTORY — DX: Presence of external hearing-aid: Z97.4

## 2016-04-22 HISTORY — PX: CYSTOSCOPY/RETROGRADE/URETEROSCOPY: SHX5316

## 2016-04-22 HISTORY — DX: Paroxysmal atrial fibrillation: I48.0

## 2016-04-22 HISTORY — DX: Personal history of other infectious and parasitic diseases: Z86.19

## 2016-04-22 HISTORY — DX: Other specified disorders of kidney and ureter: N28.89

## 2016-04-22 HISTORY — DX: Cyst of kidney, acquired: N28.1

## 2016-04-22 HISTORY — DX: Urgency of urination: R39.15

## 2016-04-22 LAB — POCT I-STAT, CHEM 8
BUN: 25 mg/dL — ABNORMAL HIGH (ref 6–20)
Calcium, Ion: 1.22 mmol/L (ref 1.15–1.40)
Chloride: 101 mmol/L (ref 101–111)
Creatinine, Ser: 1.4 mg/dL — ABNORMAL HIGH (ref 0.61–1.24)
Glucose, Bld: 99 mg/dL (ref 65–99)
HCT: 40 % (ref 39.0–52.0)
Hemoglobin: 13.6 g/dL (ref 13.0–17.0)
Potassium: 4 mmol/L (ref 3.5–5.1)
Sodium: 141 mmol/L (ref 135–145)
TCO2: 28 mmol/L (ref 0–100)

## 2016-04-22 SURGERY — CYSTOSCOPY/RETROGRADE/URETEROSCOPY
Anesthesia: General | Site: Renal | Laterality: Right

## 2016-04-22 MED ORDER — LIDOCAINE 2% (20 MG/ML) 5 ML SYRINGE
INTRAMUSCULAR | Status: AC
  Filled 2016-04-22: qty 5

## 2016-04-22 MED ORDER — OXYCODONE HCL 5 MG PO TABS
5.0000 mg | ORAL_TABLET | Freq: Once | ORAL | Status: AC | PRN
  Administered 2016-04-22: 5 mg via ORAL
  Filled 2016-04-22: qty 1

## 2016-04-22 MED ORDER — PROPOFOL 10 MG/ML IV BOLUS
INTRAVENOUS | Status: AC
  Filled 2016-04-22: qty 20

## 2016-04-22 MED ORDER — CEFAZOLIN SODIUM-DEXTROSE 2-4 GM/100ML-% IV SOLN
2.0000 g | INTRAVENOUS | Status: AC
  Administered 2016-04-22: 2 g via INTRAVENOUS
  Filled 2016-04-22: qty 100

## 2016-04-22 MED ORDER — SODIUM CHLORIDE 0.9 % IR SOLN
Status: DC | PRN
  Administered 2016-04-22: 3000 mL via INTRAVESICAL

## 2016-04-22 MED ORDER — ONDANSETRON HCL 4 MG/2ML IJ SOLN
INTRAMUSCULAR | Status: AC
  Filled 2016-04-22: qty 2

## 2016-04-22 MED ORDER — CEFAZOLIN SODIUM-DEXTROSE 2-4 GM/100ML-% IV SOLN
INTRAVENOUS | Status: AC
  Filled 2016-04-22: qty 100

## 2016-04-22 MED ORDER — OXYCODONE HCL 5 MG/5ML PO SOLN
5.0000 mg | Freq: Once | ORAL | Status: AC | PRN
  Filled 2016-04-22: qty 5

## 2016-04-22 MED ORDER — FENTANYL CITRATE (PF) 100 MCG/2ML IJ SOLN
25.0000 ug | INTRAMUSCULAR | Status: DC | PRN
  Filled 2016-04-22: qty 1

## 2016-04-22 MED ORDER — LIDOCAINE HCL (CARDIAC) 20 MG/ML IV SOLN
INTRAVENOUS | Status: DC | PRN
  Administered 2016-04-22: 60 mg via INTRAVENOUS

## 2016-04-22 MED ORDER — LACTATED RINGERS IV SOLN
INTRAVENOUS | Status: DC
  Administered 2016-04-22: 11:00:00 via INTRAVENOUS
  Filled 2016-04-22: qty 1000

## 2016-04-22 MED ORDER — CEFAZOLIN IN D5W 1 GM/50ML IV SOLN
1.0000 g | INTRAVENOUS | Status: DC
  Filled 2016-04-22: qty 50

## 2016-04-22 MED ORDER — TRAMADOL HCL 50 MG PO TABS: 50.0000 mg | ORAL_TABLET | Freq: Four times a day (QID) | ORAL | 0 refills | Status: DC | PRN

## 2016-04-22 MED ORDER — MEPERIDINE HCL 25 MG/ML IJ SOLN
6.2500 mg | INTRAMUSCULAR | Status: DC | PRN
  Filled 2016-04-22: qty 1

## 2016-04-22 MED ORDER — OXYCODONE HCL 5 MG PO TABS
ORAL_TABLET | ORAL | Status: AC
  Filled 2016-04-22: qty 1

## 2016-04-22 MED ORDER — IOHEXOL 300 MG/ML  SOLN
INTRAMUSCULAR | Status: DC | PRN
  Administered 2016-04-22: 9 mL via URETHRAL

## 2016-04-22 MED ORDER — ONDANSETRON HCL 4 MG/2ML IJ SOLN
INTRAMUSCULAR | Status: DC | PRN
  Administered 2016-04-22: 4 mg via INTRAVENOUS

## 2016-04-22 MED ORDER — ONDANSETRON HCL 4 MG/2ML IJ SOLN
4.0000 mg | Freq: Once | INTRAMUSCULAR | Status: DC | PRN
  Filled 2016-04-22: qty 2

## 2016-04-22 MED ORDER — FENTANYL CITRATE (PF) 100 MCG/2ML IJ SOLN
INTRAMUSCULAR | Status: AC
  Filled 2016-04-22: qty 2

## 2016-04-22 MED ORDER — FENTANYL CITRATE (PF) 100 MCG/2ML IJ SOLN
INTRAMUSCULAR | Status: DC | PRN
  Administered 2016-04-22 (×8): 12.5 ug via INTRAVENOUS

## 2016-04-22 MED ORDER — PROPOFOL 10 MG/ML IV BOLUS
INTRAVENOUS | Status: DC | PRN
  Administered 2016-04-22: 100 mg via INTRAVENOUS
  Administered 2016-04-22 (×2): 50 mg via INTRAVENOUS
  Administered 2016-04-22: 100 mg via INTRAVENOUS

## 2016-04-22 SURGICAL SUPPLY — 15 items
BAG DRAIN URO-CYSTO SKYTR STRL (DRAIN) ×3 IMPLANT
BAG DRN UROCATH (DRAIN) ×1
CATH INTERMIT  6FR 70CM (CATHETERS) ×3 IMPLANT
CLOTH BEACON ORANGE TIMEOUT ST (SAFETY) ×3 IMPLANT
GLOVE BIO SURGEON STRL SZ8 (GLOVE) ×3 IMPLANT
GUIDEWIRE ANG ZIPWIRE 038X150 (WIRE) ×3 IMPLANT
GUIDEWIRE STR DUAL SENSOR (WIRE) ×2 IMPLANT
IV NS IRRIG 3000ML ARTHROMATIC (IV SOLUTION) ×3 IMPLANT
KIT ROOM TURNOVER WOR (KITS) ×3 IMPLANT
MANIFOLD NEPTUNE II (INSTRUMENTS) ×2 IMPLANT
NS IRRIG 500ML POUR BTL (IV SOLUTION) ×2 IMPLANT
PACK CYSTO (CUSTOM PROCEDURE TRAY) ×3 IMPLANT
SHEATH ACCESS URETERAL 38CM (SHEATH) ×2 IMPLANT
STENT URET 6FRX26 CONTOUR (STENTS) ×2 IMPLANT
SYRINGE 10CC LL (SYRINGE) ×3 IMPLANT

## 2016-04-22 NOTE — Brief Op Note (Signed)
04/22/2016  1:49 PM  PATIENT:  Jeffrey Hardy  81 y.o. male  PRE-OPERATIVE DIAGNOSIS:  RIGHT RENAL MASS  POST-OPERATIVE DIAGNOSIS:  RIGHT RENAL MASS  PROCEDURE:  Procedure(s): CYSTOSCOPY/RETROGRADE/URETEROSCOPY/ BIOPSY/ STENT PLACEMENT (Right)  SURGEON:  Surgeon(s) and Role:    * [name], MD - Primary  PHYSICIAN ASSISTANT:   ASSISTANTS: none   ANESTHESIA:   general  EBL:  Total I/O In: 600 [I.V.:600] Out: 0   BLOOD ADMINISTERED:none  DRAINS: right 6x26 JJ ureteral stent without tether   LOCAL MEDICATIONS USED:  NONE  SPECIMEN:  Source of Specimen:  right renal pelvic washing for cytology and right renal pelvis biopsy  DISPOSITION OF SPECIMEN:  PATHOLOGY  COUNTS:  YES  TOURNIQUET:  * No tourniquets in log *  DICTATION: .Note written in EPIC  PLAN OF CARE: Discharge to home after PACU  PATIENT DISPOSITION:  PACU - hemodynamically stable.   Delay start of Pharmacological VTE agent (>24hrs) due to surgical blood loss or risk of bleeding: not applicable

## 2016-04-22 NOTE — Anesthesia Postprocedure Evaluation (Signed)
Anesthesia Post Note  Patient: Jeffrey Hardy  Procedure(s) Performed: Procedure(s) (LRB): CYSTOSCOPY/RETROGRADE/URETEROSCOPY/ BIOPSY/ STENT PLACEMENT (Right)  Patient location during evaluation: PACU Anesthesia Type: General Level of consciousness: awake and alert Pain management: pain level controlled Vital Signs Assessment: post-procedure vital signs reviewed and stable Respiratory status: spontaneous breathing, nonlabored ventilation, respiratory function stable and patient connected to nasal cannula oxygen Cardiovascular status: blood pressure returned to baseline and stable Postop Assessment: no signs of nausea or vomiting Anesthetic complications: no       Last Vitals:  Vitals:   04/22/16 1415 04/22/16 1430  BP: 129/63 124/61  Pulse: 76 66  Resp: 14 15  Temp:      Last Pain:  Vitals:   04/22/16 1057  TempSrc: Oral                 Effie Berkshire

## 2016-04-22 NOTE — Discharge Instructions (Signed)
Ureteroscopy, Care After This sheet gives you information about how to care for yourself after your procedure. Your health care provider may also give you more specific instructions. If you have problems or questions, contact your health care provider. What can I expect after the procedure? After the procedure, it is common to have:  A burning sensation when you urinate.  Blood in your urine.  Mild discomfort in the bladder area or kidney area when urinating.  Needing to urinate more often or urgently. Follow these instructions at home: Medicines  Take over-the-counter and prescription medicines only as told by your health care provider.  If you were prescribed an antibiotic medicine, take it as told by your health care provider. Do not stop taking the antibiotic even if you start to feel better. General instructions  Donot drive for 24 hours if you were given a medicine to help you relax (sedative) during your procedure.  To relieve burning, try taking a warm bath or holding a warm washcloth over your groin.  Drink enough fluid to keep your urine clear or pale yellow.  Drink two 8-ounce glasses of water every hour for the first 2 hours after you get home.  Continue to drink water often at home.  You can eat what you usually do.  Keep all follow-up visits as told by your health care provider. This is important.  If you had a tube placed to keep urine flowing (ureteral stent), ask your health care provider when you need to return to have it removed. Contact a health care provider if:  You have chills or a fever.  You have burning pain for longer than 24 hours after the procedure.  You have blood in your urine for longer than 24 hours after the procedure. Get help right away if:  You have large amounts of blood in your urine.  You have blood clots in your urine.  You have very bad pain.  You have chest pain or trouble breathing.  You are unable to urinate and you  have the feeling of a full bladder. This information is not intended to replace advice given to you by your health care provider. Make sure you discuss any questions you have with your health care provider. Document Released: 03/30/2013 Document Revised: 01/09/2016 Document Reviewed: 01/05/2016 Elsevier Interactive Patient Education  2017 [place] [facility] Instructions  Activity: Get plenty of rest for the remainder of the day. A responsible adult should stay with you for 24 hours following the procedure.  For the next 24 hours, DO NOT: -Drive a car -Paediatric nurse -Drink alcoholic beverages -Take any medication unless instructed by your physician -Make any legal decisions or sign important papers.  Meals: Start with liquid foods such as gelatin or soup. Progress to regular foods as tolerated. Avoid greasy, spicy, heavy foods. If nausea and/or vomiting occur, drink only clear liquids until the nausea and/or vomiting subsides. Call your physician if vomiting continues.  Special Instructions/Symptoms: Your throat may feel dry or sore from the anesthesia or the breathing tube placed in your throat during surgery. If this causes discomfort, gargle with warm salt water. The discomfort should disappear within 24 hours.  If you had a scopolamine patch placed behind your ear for the management of post- operative nausea and/or vomiting:  1. The medication in the patch is effective for 72 hours, after which it should be removed.  Wrap patch in a tissue and discard in the trash. Wash hands  thoroughly with soap and water. 2. You may remove the patch earlier than 72 hours if you experience unpleasant side effects which may include dry mouth, dizziness or visual disturbances. 3. Avoid touching the patch. Wash your hands with soap and water after contact with the patch.

## 2016-04-22 NOTE — Anesthesia Procedure Notes (Signed)
Procedure Name: LMA Insertion Date/Time: 04/22/2016 1:10 PM Performed by: Justice Rocher Pre-anesthesia Checklist: Patient identified, Emergency Drugs available, Suction available and Patient being monitored Patient Re-evaluated:Patient Re-evaluated prior to inductionOxygen Delivery Method: Circle system utilized Preoxygenation: Pre-oxygenation with 100% oxygen Intubation Type: IV induction Ventilation: Mask ventilation without difficulty LMA: LMA inserted LMA Size: 5.0 Number of attempts: 1 Airway Equipment and Method: Bite block Placement Confirmation: positive ETCO2 and breath sounds checked- equal and bilateral Tube secured with: Tape Dental Injury: Teeth and Oropharynx as per pre-operative assessment

## 2016-04-22 NOTE — Transfer of Care (Signed)
Immediate Anesthesia Transfer of Care Note  Patient: Jeffrey Hardy  Procedure(s) Performed: Procedure(s) (LRB): CYSTOSCOPY/RETROGRADE/URETEROSCOPY/ BIOPSY/ STENT PLACEMENT (Right)  Patient Location: PACU  Anesthesia Type: General  Level of Consciousness: awake, sedated, patient cooperative and responds to stimulation  Airway & Oxygen Therapy: Patient Spontanous Breathing and Patient connected to [place] O2  Post-op Assessment: Report given to PACU RN, Post -op Vital signs reviewed and stable and Patient moving all extremities  Post vital signs: Reviewed and stable  Complications: No apparent anesthesia complications

## 2016-04-22 NOTE — H&P (Signed)
Urology Admission H&P  Chief Complaint: gross hematuria  History of Present Illness: Jeffrey Hardy is a 81yo with gorss hematuria and a newly diagnosed central renal mass  Past Medical History:  Diagnosis Date  . Anxiety   . Bilateral renal cysts   . Depression   . Diverticulosis of colon   . Erectile dysfunction   . Hematuria   . Hiatal hernia   . History of cerebral embolic infarction x2 CVA's ---  neurologist-  [name]--- per pt residual abnormal gait , does need cane   12-16-2011  right frontal embolic infarct and 99991111 left frontal embolic infarct (per first MRI previous hemorrgaic cerebral ischemia)  . History of duodenal ulcer    1997  . History of Helicobacter pylori infection    1997  . History of syncope    1997  and 2014  s/p  loop recorder  . Hyperlipidemia   . Hypertension   . Nocturia   . PAF (paroxysmal atrial fibrillation) [facility])    cardiologist-  dr [name]  . Right renal mass   . Status post placement of implantable loop recorder    07-06-2012  . Urgency of urination   . Wears hearing aid    right ear only   Past Surgical History:  Procedure Laterality Date  . APPENDECTOMY  age 58  . COLONOSCOPY  last one 07-17-2015  . ESOPHAGOGASTRODUODENOSCOPY  last one 09-16-2014  . LOOP RECORDER IMPLANT N/A 07/06/2012   Procedure: LOOP RECORDER IMPLANT;  Surgeon: [name], MD;  Location: [facility] CATH LAB;  Service: Cardiovascular;  Laterality: N/A;  . TEE WITHOUT CARDIOVERSION  12/19/2011   Procedure: TRANSESOPHAGEAL ECHOCARDIOGRAM (TEE);  Surgeon: [name], MD;  Location: [facility] ENDOSCOPY;  Service: Cardiovascular;  Laterality: N/A;  no evidence thrombus in the atrial cavity or appendage;  mild AV thickened w/ mild AI;  minimal fixed plaque in thoracic aorta;  mild MV thickened w/ mild Jeffrey;  NO pfo by color doppler or with injection of agitated saline  . TRANSTHORACIC ECHOCARDIOGRAM  09-10-20103   grade 1 diastolic dysfunction,  ef 55-60%/  AV sclerosis without stenosis/   trivial TR  . WRIST FRACTURE SURGERY Right 2014  approx.   retained hardware    Home Medications:  Prescriptions Prior to Admission  Medication Sig Dispense Refill Last Dose  . apixaban (ELIQUIS) 5 MG TABS tablet Take 1 tablet (5 mg total) by mouth 2 (two) times daily. 60 tablet 6 Past Week at Unknown time  . lisinopril (PRINIVIL,ZESTRIL) 5 MG tablet TAKE ONE (1) TABLET EACH DAY (Patient taking differently: Take 5 mg by mouth every morning. TAKE ONE (1) TABLET EACH DAY) 90 tablet 3 Past Week at Unknown time  . tamsulosin (FLOMAX) 0.4 MG CAPS capsule Take 0.4 mg by mouth daily after breakfast.   04/22/2016 at 0800   Allergies: No Known Allergies  Family History  Problem Relation Age of Onset  . Heart Problems Mother   . Brain cancer Father   . Parkinsonism Brother   . Dementia Brother   . Colon cancer Neg Hx   . Esophageal cancer Neg Hx   . Stomach cancer Neg Hx   . Gastric cancer Neg Hx   . Liver disease Neg Hx   . Kidney disease Neg Hx   . Diabetes Neg Hx    Social History:  reports that he quit smoking about 49 years ago. His smoking use included Cigarettes and Pipe. He quit after 5.00 years of use. He has  never used smokeless tobacco. He reports that he does not drink alcohol or use drugs.  Review of Systems  Genitourinary: Positive for hematuria.  All other systems reviewed and are negative.   Physical Exam:  Vital signs in last 24 hours: Temp:  [97.5 F (36.4 C)] 97.5 F (36.4 C) (01/15 1057) Pulse Rate:  [98] 98 (01/15 1057) Resp:  [18] 18 (01/15 1057) BP: (138)/(83) 138/83 (01/15 1057) SpO2:  [100 %] 100 % (01/15 1057) Weight:  [86.2 kg (190 lb)] 86.2 kg (190 lb) (01/15 1057) Physical Exam  Constitutional: He is oriented to person, place, and time. He appears well-developed and well-nourished.  HENT:  Head: Normocephalic and atraumatic.  Eyes: EOM are normal. Pupils are equal, round, and reactive to light.  Neck: Normal range of motion. No thyromegaly present.   Cardiovascular: Normal rate and regular rhythm.   Respiratory: Effort normal. No respiratory distress.  GI: Soft. He exhibits no distension.  Musculoskeletal: Normal range of motion. He exhibits no edema.  Neurological: He is alert and oriented to person, place, and time.  Skin: Skin is warm and dry.  Psychiatric: He has a normal mood and affect. His behavior is normal. Judgment and thought content normal.    Laboratory Data:  Results for orders placed or performed during the hospital encounter of 04/22/16 (from the past 24 hour(s))  I-STAT, chem 8     Status: Abnormal   Collection Time: 04/22/16 11:36 AM  Result Value Ref Range   Sodium 141 135 - 145 mmol/L   Potassium 4.0 3.5 - 5.1 mmol/L   Chloride 101 101 - 111 mmol/L   BUN 25 (H) 6 - 20 mg/dL   Creatinine, Ser 1.40 (H) 0.61 - 1.24 mg/dL   Glucose, Bld 99 65 - 99 mg/dL   Calcium, Ion 1.22 1.15 - 1.40 mmol/L   TCO2 28 0 - 100 mmol/L   Hemoglobin 13.6 13.0 - 17.0 g/dL   HCT 40.0 39.0 - 52.0 %   No results found for this or any previous visit (from the past 240 hour(s)). Creatinine:  Recent Labs  04/22/16 1136  CREATININE 1.40*   Baseline Creatinine: 1.4  Impression/Assessment:  80yo with right renal mass  Plan:  The risks/benefits/alternatives to right ureteroscopy with possile biopsy was explained to the patient and he understands and wishes to proceed with surgery  Nicolette Bang 04/22/2016, 12:34 PM

## 2016-04-22 NOTE — Anesthesia Preprocedure Evaluation (Signed)
Anesthesia Evaluation  Patient identified by MRN, date of birth, ID band Patient awake    Reviewed: Allergy & Precautions, NPO status , Patient's Chart, lab work & pertinent test results  Airway Mallampati: II  TM Distance: >3 FB Neck ROM: Full    Dental no notable dental hx.    Pulmonary former smoker,    Pulmonary exam normal breath sounds clear to auscultation       Cardiovascular hypertension, + Peripheral Vascular Disease  negative cardio ROS Normal cardiovascular exam Rhythm:Regular Rate:Normal     Neuro/Psych PSYCHIATRIC DISORDERS Anxiety Depression  Neuromuscular disease    GI/Hepatic Neg liver ROS, hiatal hernia, GERD  ,  Endo/Other  negative endocrine ROS  Renal/GU Renal disease     Musculoskeletal  (+) Arthritis ,   Abdominal   Peds  Hematology negative hematology ROS (+)   Anesthesia Other Findings   Reproductive/Obstetrics                             Anesthesia Physical Anesthesia Plan  ASA: II  Anesthesia Plan: General   Post-op Pain Management:    Induction: Intravenous  Airway Management Planned: LMA  Additional Equipment:   Intra-op Plan:   Post-operative Plan: Extubation in OR  Informed Consent: I have reviewed the patients History and Physical, chart, labs and discussed the procedure including the risks, benefits and alternatives for the proposed anesthesia with the patient or authorized representative who has indicated his/her understanding and acceptance.   Dental advisory given  Plan Discussed with: CRNA  Anesthesia Plan Comments:         Anesthesia Quick Evaluation

## 2016-04-23 ENCOUNTER — Encounter (HOSPITAL_BASED_OUTPATIENT_CLINIC_OR_DEPARTMENT_OTHER): Payer: Self-pay | Admitting: Urology

## 2016-04-26 NOTE — Op Note (Signed)
Preoperative diagnosis: Right renal tumor  Postoperative diagnosis: Same  Procedure: 1 cystoscopy 2.  right retrograde pyelography 3.  Intraoperative fluoroscopy, under one hour, with interpretation 4.  Right diagnostic ureteroscopy with biopsy of renal pelvis 5. Right 6x26 JJ ureteral stent placement  Attending: Rosie Fate  Anesthesia: General  Estimated blood loss: None  Drains: right 6x26 JJ ureteral stent without tether  Specimens: right renal pelvis biopsy  Antibiotics: ancef  Findings: no hydronephrosis on the right. Sessile tumor involving the renal pelvis.  Indications: Patient is a 81 year old male with a history of gross hematuria and new renal mass concerning for malignnacy.  After discussing treatment options, she decided proceed with right diagnostic ureteroscopy  Procedure her in detail: The patient was brought to the operating room and a brief timeout was done to ensure correct patient, correct procedure, correct site.  General anesthesia was administered patient was placed in dorsal lithotomy position.  Her genitalia was then prepped and draped in usual sterile fashion.  A rigid 67 French cystoscope was passed in the urethra and the bladder.  Bladder was inspected free masses or lesions.  the right ureteral orifices were in the normal orthotopic locations.  a 6 french ureteral catheter was then instilled into the right ureter orifice.  a gentle retrograde was obtained and findings noted above.  we then placed a zip wire through the ureteral catheter and advanced up to the renal pelvis.  we then removed the cystoscope and cannulated the right ureteral orifice with a semirigid ureteroscope.  we then performed ureteroscopy up to the level of the UPJ. No stone or tumor was encountered. We then placed a sensor wire into the renal pelvis. We removed the scope and advanced a flexible ureteroscope over the sensor wire and advanced it up to the renal pelvis. We encountered a  sessile tumor involving the renal pelvis. Using a biopsy grasper 2 specimens were obtained and sent for pathology. We then removed the scope and placed a 6x26 stent over the original zip wire. We removed the wire and good coil was noted in the renal pelvis under fluoroscopy and the bladder under direct vision. the bladder was then drained and this concluded the procedure which was well tolerated by patient.  Complications: None  Condition: Stable, extubated, transferred to PACU  Plan: Pt is to followup in 1 week for pathology discussion

## 2016-04-29 ENCOUNTER — Other Ambulatory Visit: Payer: Self-pay | Admitting: Urology

## 2016-04-30 DIAGNOSIS — C651 Malignant neoplasm of right renal pelvis: Secondary | ICD-10-CM | POA: Diagnosis not present

## 2016-05-07 NOTE — Patient Instructions (Addendum)
Elmin L Overbaugh  05/07/2016   Your procedure is scheduled on:05/13/2016   Report to [place]  Entrance take [place]  elevators to 3rd floor to  [place] at   [place] AM.  Call this number if you have problems the morning of surgery [phone]   Remember: ONLY 1 PERSON MAY GO WITH YOU TO SHORT STAY TO GET  READY MORNING OF [place].  Do not eat food or drink liquids :After Midnight.     Take these medicines the morning of surgery with A SIP OF WATER: NONE              You may not have any metal on your body including hair pins and              piercings  Do not wear jewelry, make-up, lotions, powders or perfumes, deodorant                         Men may shave face and neck.   Do not bring valuables to the hospital. [place].  Contacts, dentures or bridgework may not be worn into surgery.  Leave suitcase in the car. After surgery it may be brought to your room.                  Please read over the following fact sheets you were given: _____________________________________________________________________             [facility] - Preparing for Surgery Before surgery, you can play an important role.  Because skin is not sterile, your skin needs to be as free of germs as possible.  You can reduce the number of germs on your skin by washing with CHG (chlorahexidine gluconate) soap before surgery.  CHG is an antiseptic cleaner which kills germs and bonds with the skin to continue killing germs even after washing. Please DO NOT use if you have an allergy to CHG or antibacterial soaps.  If your skin becomes reddened/irritated stop using the CHG and inform your nurse when you arrive at Short Stay. Do not shave (including legs and underarms) for at least 48 hours prior to the first CHG shower.  You may shave your face/neck. Please follow these instructions carefully:  1.  Shower with CHG Soap the night  before surgery and the  morning of Surgery.  2.  If you choose to wash your hair, wash your hair first as usual with your  normal  shampoo.  3.  After you shampoo, rinse your hair and body thoroughly to remove the  shampoo.                           4.  Use CHG as you would any other liquid soap.  You can apply chg directly  to the skin and wash                       Gently with a scrungie or clean washcloth.  5.  Apply the CHG Soap to your body ONLY FROM THE NECK DOWN.   Do not use on face/ open  Wound or open sores. Avoid contact with eyes, ears mouth and genitals (private parts).                       Wash face,  Genitals (private parts) with your normal soap.             6.  Wash thoroughly, paying special attention to the area where your surgery  will be performed.  7.  Thoroughly rinse your body with warm water from the neck down.  8.  DO NOT shower/wash with your normal soap after using and rinsing off  the CHG Soap.                9.  Pat yourself dry with a clean towel.            10.  Wear clean pajamas.            11.  Place clean sheets on your bed the night of your first shower and do not  sleep with pets. Day of Surgery : Do not apply any lotions/deodorants the morning of surgery.  Please wear clean clothes to the hospital/surgery center.  FAILURE TO FOLLOW THESE INSTRUCTIONS MAY RESULT IN THE CANCELLATION OF YOUR SURGERY PATIENT SIGNATURE_________________________________  NURSE SIGNATURE__________________________________  ________________________________________________________________________  WHAT IS A BLOOD TRANSFUSION? Blood Transfusion Information  A transfusion is the replacement of blood or some of its parts. Blood is made up of multiple cells which provide different functions.  Red blood cells carry oxygen and are used for blood loss replacement.  White blood cells fight against infection.  Platelets control bleeding.  Plasma helps clot  blood.  Other blood products are available for specialized needs, such as hemophilia or other clotting disorders. BEFORE THE TRANSFUSION  Who gives blood for transfusions?   Healthy volunteers who are fully evaluated to make sure their blood is safe. This is blood bank blood. Transfusion therapy is the safest it has ever been in the practice of medicine. Before blood is taken from a donor, a complete history is taken to make sure that person has no history of diseases nor engages in risky social behavior (examples are intravenous drug use or sexual activity with multiple partners). The donor's travel history is screened to minimize risk of transmitting infections, such as malaria. The donated blood is tested for signs of infectious diseases, such as HIV and hepatitis. The blood is then tested to be sure it is compatible with you in order to minimize the chance of a transfusion reaction. If you or a relative donates blood, this is often done in anticipation of surgery and is not appropriate for emergency situations. It takes many days to process the donated blood. RISKS AND COMPLICATIONS Although transfusion therapy is very safe and saves many lives, the main dangers of transfusion include:   Getting an infectious disease.  Developing a transfusion reaction. This is an allergic reaction to something in the blood you were given. Every precaution is taken to prevent this. The decision to have a blood transfusion has been considered carefully by your caregiver before blood is given. Blood is not given unless the benefits outweigh the risks. AFTER THE TRANSFUSION  Right after receiving a blood transfusion, you will usually feel much better and more energetic. This is especially true if your red blood cells have gotten low (anemic). The transfusion raises the level of the red blood cells which carry oxygen, and this usually causes an energy increase.  The  nurse administering the transfusion will monitor  you carefully for complications. HOME CARE INSTRUCTIONS  No special instructions are needed after a transfusion. You may find your energy is better. Speak with your caregiver about any limitations on activity for underlying diseases you may have. SEEK MEDICAL CARE IF:   Your condition is not improving after your transfusion.  You develop redness or irritation at the intravenous (IV) site. SEEK IMMEDIATE MEDICAL CARE IF:  Any of the following symptoms occur over the next 12 hours:  Shaking chills.  You have a temperature by mouth above 102 F (38.9 C), not controlled by medicine.  Chest, back, or muscle pain.  People around you feel you are not acting correctly or are confused.  Shortness of breath or difficulty breathing.  Dizziness and fainting.  You get a rash or develop hives.  You have a decrease in urine output.  Your urine turns a dark color or changes to pink, red, or brown. Any of the following symptoms occur over the next 10 days:  You have a temperature by mouth above 102 F (38.9 C), not controlled by medicine.  Shortness of breath.  Weakness after normal activity.  The white part of the eye turns yellow (jaundice).  You have a decrease in the amount of urine or are urinating less often.  Your urine turns a dark color or changes to pink, red, or brown. Document Released: 03/22/2000 Document Revised: 06/17/2011 Document Reviewed: 11/09/2007 [facility] Patient Information 2014 [place], [place].  _______________________________________________________________________

## 2016-05-09 ENCOUNTER — Encounter (HOSPITAL_COMMUNITY)
Admission: RE | Admit: 2016-05-09 | Discharge: 2016-05-09 | Disposition: A | Discharge status: Home / Self Care | Payer: PPO | Source: Ambulatory Visit | Attending: Urology | Admitting: Urology

## 2016-05-09 ENCOUNTER — Encounter (HOSPITAL_COMMUNITY): Payer: Self-pay

## 2016-05-09 DIAGNOSIS — N289 Disorder of kidney and ureter, unspecified: Secondary | ICD-10-CM | POA: Insufficient documentation

## 2016-05-09 DIAGNOSIS — Z01812 Encounter for preprocedural laboratory examination: Secondary | ICD-10-CM | POA: Insufficient documentation

## 2016-05-09 HISTORY — DX: Malignant (primary) neoplasm, unspecified: C80.1

## 2016-05-09 HISTORY — DX: Personal history of other diseases of the digestive system: Z87.19

## 2016-05-09 HISTORY — DX: Other specified postprocedural states: Z98.890

## 2016-05-09 LAB — CBC
HCT: 39.3 % (ref 39.0–52.0)
Hemoglobin: 13.1 g/dL (ref 13.0–17.0)
MCH: 31.6 pg (ref 26.0–34.0)
MCHC: 33.3 g/dL (ref 30.0–36.0)
MCV: 94.9 fL (ref 78.0–100.0)
Platelets: 175 10*3/uL (ref 150–400)
RBC: 4.14 MIL/uL — ABNORMAL LOW (ref 4.22–5.81)
RDW: 13 % (ref 11.5–15.5)
WBC: 6.9 10*3/uL (ref 4.0–10.5)

## 2016-05-09 LAB — BASIC METABOLIC PANEL
Anion gap: 7 (ref 5–15)
BUN: 23 mg/dL — ABNORMAL HIGH (ref 6–20)
CO2: 28 mmol/L (ref 22–32)
Calcium: 9.4 mg/dL (ref 8.9–10.3)
Chloride: 103 mmol/L (ref 101–111)
Creatinine, Ser: 1.54 mg/dL — ABNORMAL HIGH (ref 0.61–1.24)
GFR calc Af Amer: 48 mL/min — ABNORMAL LOW (ref >60)
GFR calc non Af Amer: 41 mL/min — ABNORMAL LOW (ref >60)
Glucose, Bld: 97 mg/dL (ref 65–99)
Potassium: 4.2 mmol/L (ref 3.5–5.1)
Sodium: 138 mmol/L (ref 135–145)

## 2016-05-09 LAB — ABO/RH: ABO/RH(D): B POS

## 2016-05-09 NOTE — Progress Notes (Signed)
EKG done 11/09/15 in epic

## 2016-05-09 NOTE — Progress Notes (Signed)
BMP routed to Dr. Alyson Ingles via epic.

## 2016-05-09 NOTE — Progress Notes (Signed)
Pt. VU of instructions from surgeon regarding bowel prep and meds to stop prior to surgery.

## 2016-05-13 ENCOUNTER — Encounter (HOSPITAL_COMMUNITY): Payer: Self-pay | Admitting: Anesthesiology

## 2016-05-13 ENCOUNTER — Encounter (HOSPITAL_COMMUNITY)
Admission: RE | Disposition: A | Discharge status: Home / Self Care | Payer: Self-pay | Source: Ambulatory Visit | Attending: Urology

## 2016-05-13 ENCOUNTER — Inpatient Hospital Stay (HOSPITAL_COMMUNITY): Payer: PPO | Admitting: Anesthesiology

## 2016-05-13 ENCOUNTER — Inpatient Hospital Stay (HOSPITAL_COMMUNITY)
Admission: RE | Admit: 2016-05-13 | Discharge: 2016-05-16 | DRG: 658 | Disposition: A | Discharge status: Home / Self Care | Payer: PPO | Source: Ambulatory Visit | Attending: Urology | Admitting: Urology

## 2016-05-13 DIAGNOSIS — R066 Hiccough: Secondary | ICD-10-CM | POA: Diagnosis not present

## 2016-05-13 DIAGNOSIS — N2889 Other specified disorders of kidney and ureter: Secondary | ICD-10-CM | POA: Diagnosis not present

## 2016-05-13 DIAGNOSIS — E785 Hyperlipidemia, unspecified: Secondary | ICD-10-CM | POA: Diagnosis not present

## 2016-05-13 DIAGNOSIS — R269 Unspecified abnormalities of gait and mobility: Secondary | ICD-10-CM | POA: Diagnosis not present

## 2016-05-13 DIAGNOSIS — Z7901 Long term (current) use of anticoagulants: Secondary | ICD-10-CM | POA: Diagnosis not present

## 2016-05-13 DIAGNOSIS — I10 Essential (primary) hypertension: Secondary | ICD-10-CM | POA: Diagnosis not present

## 2016-05-13 DIAGNOSIS — Z87891 Personal history of nicotine dependence: Secondary | ICD-10-CM | POA: Diagnosis not present

## 2016-05-13 DIAGNOSIS — R310 Gross hematuria: Secondary | ICD-10-CM | POA: Diagnosis present

## 2016-05-13 DIAGNOSIS — I69398 Other sequelae of cerebral infarction: Secondary | ICD-10-CM

## 2016-05-13 DIAGNOSIS — Z79899 Other long term (current) drug therapy: Secondary | ICD-10-CM

## 2016-05-13 DIAGNOSIS — K219 Gastro-esophageal reflux disease without esophagitis: Secondary | ICD-10-CM | POA: Diagnosis not present

## 2016-05-13 DIAGNOSIS — C651 Malignant neoplasm of right renal pelvis: Secondary | ICD-10-CM | POA: Diagnosis not present

## 2016-05-13 DIAGNOSIS — C641 Malignant neoplasm of right kidney, except renal pelvis: Secondary | ICD-10-CM | POA: Diagnosis not present

## 2016-05-13 HISTORY — PX: ROBOT ASSITED LAPAROSCOPIC NEPHROURETERECTOMY: SHX6077

## 2016-05-13 LAB — HEMOGLOBIN AND HEMATOCRIT, BLOOD
HCT: 33.9 % — ABNORMAL LOW (ref 39.0–52.0)
Hemoglobin: 11.7 g/dL — ABNORMAL LOW (ref 13.0–17.0)

## 2016-05-13 LAB — PREPARE RBC (CROSSMATCH)

## 2016-05-13 SURGERY — NEPHROURETERECTOMY, ROBOT-ASSISTED, LAPAROSCOPIC
Anesthesia: General | Laterality: Right

## 2016-05-13 MED ORDER — LACTATED RINGERS IV SOLN
INTRAVENOUS | Status: DC
  Administered 2016-05-13: 17:00:00 via INTRAVENOUS

## 2016-05-13 MED ORDER — FENTANYL CITRATE (PF) 100 MCG/2ML IJ SOLN
INTRAMUSCULAR | Status: DC | PRN
  Administered 2016-05-13: 50 ug via INTRAVENOUS
  Administered 2016-05-13: 100 ug via INTRAVENOUS
  Administered 2016-05-13 (×2): 50 ug via INTRAVENOUS

## 2016-05-13 MED ORDER — SODIUM CHLORIDE 0.9 % IR SOLN
Status: DC | PRN
  Administered 2016-05-13: 1000 mL via INTRAVESICAL

## 2016-05-13 MED ORDER — ESMOLOL HCL 100 MG/10ML IV SOLN
INTRAVENOUS | Status: AC
  Filled 2016-05-13: qty 10

## 2016-05-13 MED ORDER — HYDROMORPHONE HCL 2 MG/ML IJ SOLN
0.5000 mg | INTRAMUSCULAR | Status: DC | PRN
  Administered 2016-05-14: 0.5 mg via INTRAVENOUS
  Filled 2016-05-13: qty 1

## 2016-05-13 MED ORDER — ONDANSETRON HCL 4 MG/2ML IJ SOLN: 4.0000 mg | Freq: Four times a day (QID) | INTRAMUSCULAR | Status: DC | PRN

## 2016-05-13 MED ORDER — EPHEDRINE 5 MG/ML INJ
INTRAVENOUS | Status: AC
  Filled 2016-05-13: qty 10

## 2016-05-13 MED ORDER — HYDROMORPHONE HCL 1 MG/ML IJ SOLN
0.2500 mg | INTRAMUSCULAR | Status: DC | PRN
  Administered 2016-05-13 (×4): 0.25 mg via INTRAVENOUS

## 2016-05-13 MED ORDER — BUPIVACAINE LIPOSOME 1.3 % IJ SUSP
INTRAMUSCULAR | Status: DC | PRN
  Administered 2016-05-13: 20 mL

## 2016-05-13 MED ORDER — DEXTROSE-NACL 5-0.45 % IV SOLN
INTRAVENOUS | Status: DC
  Administered 2016-05-13 – 2016-05-14 (×2): via INTRAVENOUS

## 2016-05-13 MED ORDER — SODIUM CHLORIDE 0.9 % IJ SOLN
INTRAMUSCULAR | Status: AC
  Filled 2016-05-13: qty 10

## 2016-05-13 MED ORDER — OXYCODONE HCL 5 MG/5ML PO SOLN
5.0000 mg | Freq: Once | ORAL | Status: DC | PRN
  Filled 2016-05-13: qty 5

## 2016-05-13 MED ORDER — TAMSULOSIN HCL 0.4 MG PO CAPS
0.4000 mg | ORAL_CAPSULE | Freq: Every day | ORAL | Status: DC
  Administered 2016-05-14 – 2016-05-16 (×3): 0.4 mg via ORAL
  Filled 2016-05-13 (×3): qty 1

## 2016-05-13 MED ORDER — HYDROCODONE-ACETAMINOPHEN 5-325 MG PO TABS
1.0000 | ORAL_TABLET | ORAL | Status: DC | PRN
  Administered 2016-05-14 (×2): 2 via ORAL
  Administered 2016-05-14: 1 via ORAL
  Administered 2016-05-14 – 2016-05-15 (×3): 2 via ORAL
  Filled 2016-05-13: qty 1
  Filled 2016-05-13 (×5): qty 2

## 2016-05-13 MED ORDER — ONDANSETRON HCL 4 MG/2ML IJ SOLN
4.0000 mg | INTRAMUSCULAR | Status: DC | PRN
  Administered 2016-05-13 – 2016-05-14 (×2): 4 mg via INTRAVENOUS
  Filled 2016-05-13 (×2): qty 2

## 2016-05-13 MED ORDER — LACTATED RINGERS IV SOLN
INTRAVENOUS | Status: DC | PRN
  Administered 2016-05-13 (×3): via INTRAVENOUS

## 2016-05-13 MED ORDER — FENTANYL CITRATE (PF) 250 MCG/5ML IJ SOLN
INTRAMUSCULAR | Status: AC
  Filled 2016-05-13: qty 5

## 2016-05-13 MED ORDER — HYDROCODONE-ACETAMINOPHEN 5-325 MG PO TABS: 1.0000 | ORAL_TABLET | Freq: Four times a day (QID) | ORAL | 0 refills | Status: DC | PRN

## 2016-05-13 MED ORDER — ROCURONIUM BROMIDE 10 MG/ML (PF) SYRINGE
PREFILLED_SYRINGE | INTRAVENOUS | Status: DC | PRN
  Administered 2016-05-13: 50 mg via INTRAVENOUS
  Administered 2016-05-13 (×2): 20 mg via INTRAVENOUS
  Administered 2016-05-13 (×2): 10 mg via INTRAVENOUS
  Administered 2016-05-13: 20 mg via INTRAVENOUS

## 2016-05-13 MED ORDER — HYDROMORPHONE HCL 1 MG/ML IJ SOLN
INTRAMUSCULAR | Status: AC
  Filled 2016-05-13: qty 1

## 2016-05-13 MED ORDER — ONDANSETRON HCL 4 MG/2ML IJ SOLN
INTRAMUSCULAR | Status: DC | PRN
  Administered 2016-05-13: 4 mg via INTRAVENOUS

## 2016-05-13 MED ORDER — DEXAMETHASONE SODIUM PHOSPHATE 10 MG/ML IJ SOLN
INTRAMUSCULAR | Status: DC | PRN
  Administered 2016-05-13: 10 mg via INTRAVENOUS

## 2016-05-13 MED ORDER — ALBUMIN HUMAN 5 % IV SOLN
INTRAVENOUS | Status: DC | PRN
Start: 2016-05-13 — End: 2016-05-13
  Administered 2016-05-13: 14:00:00 via INTRAVENOUS

## 2016-05-13 MED ORDER — ALBUMIN HUMAN 5 % IV SOLN
INTRAVENOUS | Status: AC
  Filled 2016-05-13: qty 250

## 2016-05-13 MED ORDER — ESMOLOL HCL 100 MG/10ML IV SOLN
INTRAVENOUS | Status: DC | PRN
  Administered 2016-05-13 (×6): 10 mg via INTRAVENOUS

## 2016-05-13 MED ORDER — LACTATED RINGERS IR SOLN
Status: DC | PRN
  Administered 2016-05-13: 1000 mL

## 2016-05-13 MED ORDER — SODIUM CHLORIDE 0.9 % IJ SOLN
INTRAMUSCULAR | Status: AC
  Filled 2016-05-13: qty 50

## 2016-05-13 MED ORDER — PROPOFOL 10 MG/ML IV BOLUS
INTRAVENOUS | Status: AC
  Filled 2016-05-13: qty 20

## 2016-05-13 MED ORDER — OXYCODONE HCL 5 MG PO TABS: 5.0000 mg | ORAL_TABLET | Freq: Once | ORAL | Status: DC | PRN

## 2016-05-13 MED ORDER — ACETAMINOPHEN 325 MG PO TABS: 650.0000 mg | ORAL_TABLET | ORAL | Status: DC | PRN

## 2016-05-13 MED ORDER — DIPHENHYDRAMINE HCL 50 MG/ML IJ SOLN: 12.5000 mg | Freq: Four times a day (QID) | INTRAMUSCULAR | Status: DC | PRN

## 2016-05-13 MED ORDER — SUGAMMADEX SODIUM 200 MG/2ML IV SOLN
INTRAVENOUS | Status: DC | PRN
  Administered 2016-05-13: 200 mg via INTRAVENOUS

## 2016-05-13 MED ORDER — BOOST / RESOURCE BREEZE PO LIQD
1.0000 | Freq: Three times a day (TID) | ORAL | Status: DC
Start: 2016-05-13 — End: 2016-05-16
  Administered 2016-05-14 – 2016-05-16 (×4): 1 via ORAL

## 2016-05-13 MED ORDER — SODIUM CHLORIDE 0.9 % IJ SOLN
INTRAMUSCULAR | Status: DC | PRN
  Administered 2016-05-13: 20 mL

## 2016-05-13 MED ORDER — STERILE WATER FOR IRRIGATION IR SOLN
Status: DC | PRN
  Administered 2016-05-13: 1000 mL

## 2016-05-13 MED ORDER — HYDROMORPHONE HCL 2 MG/ML IJ SOLN
INTRAMUSCULAR | Status: AC
  Filled 2016-05-13: qty 1

## 2016-05-13 MED ORDER — CEFAZOLIN SODIUM-DEXTROSE 2-4 GM/100ML-% IV SOLN
INTRAVENOUS | Status: AC
  Filled 2016-05-13: qty 100

## 2016-05-13 MED ORDER — HYDRALAZINE HCL 20 MG/ML IJ SOLN
INTRAMUSCULAR | Status: DC | PRN
  Administered 2016-05-13 (×2): 2 mg via INTRAVENOUS

## 2016-05-13 MED ORDER — BUPIVACAINE LIPOSOME 1.3 % IJ SUSP
INTRAMUSCULAR | Status: AC
  Filled 2016-05-13: qty 20

## 2016-05-13 MED ORDER — CEFAZOLIN SODIUM-DEXTROSE 2-4 GM/100ML-% IV SOLN
2.0000 g | INTRAVENOUS | Status: AC
  Administered 2016-05-13: 2 g via INTRAVENOUS

## 2016-05-13 MED ORDER — SODIUM CHLORIDE 0.9 % IV SOLN: Freq: Once | INTRAVENOUS | Status: DC

## 2016-05-13 MED ORDER — DEXAMETHASONE SODIUM PHOSPHATE 10 MG/ML IJ SOLN
INTRAMUSCULAR | Status: AC
  Filled 2016-05-13: qty 1

## 2016-05-13 MED ORDER — DIPHENHYDRAMINE HCL 12.5 MG/5ML PO ELIX: 12.5000 mg | ORAL_SOLUTION | Freq: Four times a day (QID) | ORAL | Status: DC | PRN

## 2016-05-13 MED ORDER — FENTANYL CITRATE (PF) 250 MCG/5ML IJ SOLN
INTRAMUSCULAR | Status: DC | PRN
  Administered 2016-05-13 (×8): 25 ug via INTRAVENOUS

## 2016-05-13 MED ORDER — HYDROMORPHONE HCL 1 MG/ML IJ SOLN
INTRAMUSCULAR | Status: DC | PRN
  Administered 2016-05-13: .25 mg via INTRAVENOUS
  Administered 2016-05-13: 1 mg via INTRAVENOUS

## 2016-05-13 MED ORDER — LIDOCAINE 2% (20 MG/ML) 5 ML SYRINGE
INTRAMUSCULAR | Status: DC | PRN
  Administered 2016-05-13: 100 mg via INTRAVENOUS

## 2016-05-13 MED ORDER — ONDANSETRON HCL 4 MG/2ML IJ SOLN
INTRAMUSCULAR | Status: AC
  Filled 2016-05-13: qty 2

## 2016-05-13 MED ORDER — SUGAMMADEX SODIUM 200 MG/2ML IV SOLN
INTRAVENOUS | Status: AC
  Filled 2016-05-13: qty 2

## 2016-05-13 MED ORDER — PROPOFOL 10 MG/ML IV BOLUS
INTRAVENOUS | Status: DC | PRN
Start: 2016-05-13 — End: 2016-05-13
  Administered 2016-05-13: 150 mg via INTRAVENOUS

## 2016-05-13 MED ORDER — LISINOPRIL 10 MG PO TABS: 5.0000 mg | ORAL_TABLET | Freq: Every day | ORAL | Status: DC

## 2016-05-13 MED ORDER — EPHEDRINE SULFATE-NACL 50-0.9 MG/10ML-% IV SOSY
PREFILLED_SYRINGE | INTRAVENOUS | Status: DC | PRN
  Administered 2016-05-13: 5 mg via INTRAVENOUS

## 2016-05-13 SURGICAL SUPPLY — 59 items
ADH SKN CLS APL DERMABOND .7 (GAUZE/BANDAGES/DRESSINGS) ×2
BAG LAPAROSCOPIC 12 15 PORT 16 (BASKET) ×1 IMPLANT
BAG RETRIEVAL 12/15 (BASKET) ×2
BAG RETRIEVAL 12/15MM (BASKET) ×1
CATH FOLEY 3WAY  5CC 18FR (CATHETERS) ×2
CATH FOLEY 3WAY 5CC 18FR (CATHETERS) IMPLANT
CHLORAPREP W/TINT 26ML (MISCELLANEOUS) ×3 IMPLANT
CLIP LIGATING HEM O LOK PURPLE (MISCELLANEOUS) ×9 IMPLANT
CLIP LIGATING HEMO LOK XL GOLD (MISCELLANEOUS) ×2 IMPLANT
CLIP LIGATING HEMO O LOK GREEN (MISCELLANEOUS) ×4 IMPLANT
COVER SURGICAL LIGHT HANDLE (MISCELLANEOUS) ×3 IMPLANT
COVER TIP SHEARS 8 DVNC (MISCELLANEOUS) ×1 IMPLANT
COVER TIP SHEARS 8MM DA VINCI (MISCELLANEOUS) ×2
DECANTER SPIKE VIAL GLASS SM (MISCELLANEOUS) ×1 IMPLANT
DERMABOND ADVANCED (GAUZE/BANDAGES/DRESSINGS) ×4
DERMABOND ADVANCED .7 DNX12 (GAUZE/BANDAGES/DRESSINGS) ×2 IMPLANT
DRAIN CHANNEL 15F RND FF 3/16 (WOUND CARE) ×3 IMPLANT
DRAPE ARM DVNC X/XI (DISPOSABLE) ×4 IMPLANT
DRAPE COLUMN DVNC XI (DISPOSABLE) ×1 IMPLANT
DRAPE DA VINCI XI ARM (DISPOSABLE) ×8
DRAPE DA VINCI XI COLUMN (DISPOSABLE) ×2
DRAPE INCISE IOBAN 66X45 STRL (DRAPES) ×3 IMPLANT
DRAPE SHEET LG 3/4 BI-LAMINATE (DRAPES) ×3 IMPLANT
ELECT PENCIL ROCKER SW 15FT (MISCELLANEOUS) ×3 IMPLANT
ELECT REM PT RETURN 9FT ADLT (ELECTROSURGICAL) ×3
ELECTRODE REM PT RTRN 9FT ADLT (ELECTROSURGICAL) ×1 IMPLANT
EVACUATOR SILICONE 100CC (DRAIN) ×3 IMPLANT
GLOVE BIO SURGEON STRL SZ 6.5 (GLOVE) ×2 IMPLANT
GLOVE BIO SURGEONS STRL SZ 6.5 (GLOVE) ×1
GLOVE BIOGEL M STRL SZ7.5 (GLOVE) ×6 IMPLANT
GOWN STRL REUS W/TWL LRG LVL3 (GOWN DISPOSABLE) ×9 IMPLANT
IRRIG SUCT STRYKERFLOW 2 WTIP (MISCELLANEOUS)
IRRIGATION SUCT STRKRFLW 2 WTP (MISCELLANEOUS) IMPLANT
KIT BASIN OR (CUSTOM PROCEDURE TRAY) ×3 IMPLANT
LIQUID BAND (GAUZE/BANDAGES/DRESSINGS) ×2 IMPLANT
NDL INSUFFLATION 14GA 120MM (NEEDLE) ×1 IMPLANT
NEEDLE INSUFFLATION 14GA 120MM (NEEDLE) ×3 IMPLANT
NS IRRIG 1000ML POUR BTL (IV SOLUTION) ×1 IMPLANT
PLUG CATH AND CAP STER (CATHETERS) ×3 IMPLANT
POSITIONER SURGICAL ARM (MISCELLANEOUS) ×6 IMPLANT
RELOAD WHITE ECR60W (STAPLE) ×4 IMPLANT
SEAL CANN UNIV 5-8 DVNC XI (MISCELLANEOUS) ×4 IMPLANT
SEAL XI 5MM-8MM UNIVERSAL (MISCELLANEOUS) ×8
SET IRRIG Y TYPE TUR BLADDER L (SET/KITS/TRAYS/PACK) ×3 IMPLANT
SOLUTION ELECTROLUBE (MISCELLANEOUS) ×3 IMPLANT
STAPLE ECHEON FLEX 60 POW ENDO (STAPLE) ×2 IMPLANT
SUT ETHILON 3 0 PS 1 (SUTURE) ×3 IMPLANT
SUT MNCRL AB 4-0 PS2 18 (SUTURE) ×6 IMPLANT
SUT PDS AB 1 CTX 36 (SUTURE) ×6 IMPLANT
SUT VLOC BARB 180 ABS3/0GR12 (SUTURE) ×3
SUTURE VLOC BRB 180 ABS3/0GR12 (SUTURE) ×1 IMPLANT
TOWEL OR 17X26 10 PK STRL BLUE (TOWEL DISPOSABLE) ×3 IMPLANT
TOWEL OR NON WOVEN STRL DISP B (DISPOSABLE) ×3 IMPLANT
TRAY FOLEY W/METER SILVER 16FR (SET/KITS/TRAYS/PACK) ×3 IMPLANT
TRAY LAPAROSCOPIC (CUSTOM PROCEDURE TRAY) ×3 IMPLANT
TROCAR BLADELESS OPT 5 100 (ENDOMECHANICALS) IMPLANT
TROCAR ENDOPATH XCEL 12X100 BL (ENDOMECHANICALS) ×3 IMPLANT
TROCAR XCEL 12X100 BLDLESS (ENDOMECHANICALS) ×3 IMPLANT
WATER STERILE IRR 1500ML POUR (IV SOLUTION) ×1 IMPLANT

## 2016-05-13 NOTE — Anesthesia Procedure Notes (Signed)
Procedure Name: Intubation Date/Time: 05/13/2016 12:31 PM Performed by: Keaton Beichner, Virgel Gess Pre-anesthesia Checklist: Patient identified, Emergency Drugs available, Suction available, Patient being monitored and Timeout performed Patient Re-evaluated:Patient Re-evaluated prior to inductionOxygen Delivery Method: Circle system utilized Preoxygenation: Pre-oxygenation with 100% oxygen Intubation Type: IV induction Ventilation: Mask ventilation without difficulty Laryngoscope Size: Mac and 4 Grade View: Grade II Tube type: Oral Tube size: 7.5 mm Number of attempts: 1 Airway Equipment and Method: Stylet Placement Confirmation: ETT inserted through vocal cords under direct vision,  positive ETCO2,  CO2 detector and breath sounds checked- equal and bilateral Secured at: 22 cm Tube secured with: Tape Dental Injury: Teeth and Oropharynx as per pre-operative assessment

## 2016-05-13 NOTE — H&P (View-Only) (Signed)
Urology Admission H&P  Chief Complaint: gross hematuria  History of Present Illness: Mr Dudzik is a 81yo with gorss hematuria and a newly diagnosed central renal mass  Past Medical History:  Diagnosis Date  . Anxiety   . Bilateral renal cysts   . Depression   . Diverticulosis of colon   . Erectile dysfunction   . Hematuria   . Hiatal hernia   . History of cerebral embolic infarction x2 CVA's ---  neurologist-  [name]--- per pt residual abnormal gait , does need cane   12-16-2011  right frontal embolic infarct and 99991111 left frontal embolic infarct (per first MRI previous hemorrgaic cerebral ischemia)  . History of duodenal ulcer    1997  . History of Helicobacter pylori infection    1997  . History of syncope    1997  and 2014  s/p  loop recorder  . Hyperlipidemia   . Hypertension   . Nocturia   . PAF (paroxysmal atrial fibrillation) [facility])    cardiologist-  dr [name]  . Right renal mass   . Status post placement of implantable loop recorder    07-06-2012  . Urgency of urination   . Wears hearing aid    right ear only   Past Surgical History:  Procedure Laterality Date  . APPENDECTOMY  age 89  . COLONOSCOPY  last one 07-17-2015  . ESOPHAGOGASTRODUODENOSCOPY  last one 09-16-2014  . LOOP RECORDER IMPLANT N/A 07/06/2012   Procedure: LOOP RECORDER IMPLANT;  Surgeon: [name], MD;  Location[facility] CATH LAB;  Service: Cardiovascular;  Laterality: N/A;  . TEE WITHOUT CARDIOVERSION  12/19/2011   Procedure: TRANSESOPHAGEAL ECHOCARDIOGRAM (TEE);  Surgeon: [name], MD;  Location: [facility] ENDOSCOPY;  Service: Cardiovascular;  Laterality: N/A;  no evidence thrombus in the atrial cavity or appendage;  mild AV thickened w/ mild AI;  minimal fixed plaque in thoracic aorta;  mild MV thickened w/ mild MR;  NO pfo by color doppler or with injection of agitated saline  . TRANSTHORACIC ECHOCARDIOGRAM  09-10-20103   grade 1 diastolic dysfunction,  ef 55-60%/  AV sclerosis without stenosis/   trivial TR  . WRIST FRACTURE SURGERY Right 2014  approx.   retained hardware    Home Medications:  Prescriptions Prior to Admission  Medication Sig Dispense Refill Last Dose  . apixaban (ELIQUIS) 5 MG TABS tablet Take 1 tablet (5 mg total) by mouth 2 (two) times daily. 60 tablet 6 Past Week at Unknown time  . lisinopril (PRINIVIL,ZESTRIL) 5 MG tablet TAKE ONE (1) TABLET EACH DAY (Patient taking differently: Take 5 mg by mouth every morning. TAKE ONE (1) TABLET EACH DAY) 90 tablet 3 Past Week at Unknown time  . tamsulosin (FLOMAX) 0.4 MG CAPS capsule Take 0.4 mg by mouth daily after breakfast.   04/22/2016 at 0800   Allergies: No Known Allergies  Family History  Problem Relation Age of Onset  . Heart Problems Mother   . Brain cancer Father   . Parkinsonism Brother   . Dementia Brother   . Colon cancer Neg Hx   . Esophageal cancer Neg Hx   . Stomach cancer Neg Hx   . Gastric cancer Neg Hx   . Liver disease Neg Hx   . Kidney disease Neg Hx   . Diabetes Neg Hx    Social History:  reports that he quit smoking about 49 years ago. His smoking use included Cigarettes and Pipe. He quit after 5.00 years of use. He has  never used smokeless tobacco. He reports that he does not drink alcohol or use drugs.  Review of Systems  Genitourinary: Positive for hematuria.  All other systems reviewed and are negative.   Physical Exam:  Vital signs in last 24 hours: Temp:  [97.5 F (36.4 C)] 97.5 F (36.4 C) (01/15 1057) Pulse Rate:  [98] 98 (01/15 1057) Resp:  [18] 18 (01/15 1057) BP: (138)/(83) 138/83 (01/15 1057) SpO2:  [100 %] 100 % (01/15 1057) Weight:  [86.2 kg (190 lb)] 86.2 kg (190 lb) (01/15 1057) Physical Exam  Constitutional: He is oriented to person, place, and time. He appears well-developed and well-nourished.  HENT:  Head: Normocephalic and atraumatic.  Eyes: EOM are normal. Pupils are equal, round, and reactive to light.  Neck: Normal range of motion. No thyromegaly present.   Cardiovascular: Normal rate and regular rhythm.   Respiratory: Effort normal. No respiratory distress.  GI: Soft. He exhibits no distension.  Musculoskeletal: Normal range of motion. He exhibits no edema.  Neurological: He is alert and oriented to person, place, and time.  Skin: Skin is warm and dry.  Psychiatric: He has a normal mood and affect. His behavior is normal. Judgment and thought content normal.    Laboratory Data:  Results for orders placed or performed during the hospital encounter of 04/22/16 (from the past 24 hour(s))  I-STAT, chem 8     Status: Abnormal   Collection Time: 04/22/16 11:36 AM  Result Value Ref Range   Sodium 141 135 - 145 mmol/L   Potassium 4.0 3.5 - 5.1 mmol/L   Chloride 101 101 - 111 mmol/L   BUN 25 (H) 6 - 20 mg/dL   Creatinine, Ser 1.40 (H) 0.61 - 1.24 mg/dL   Glucose, Bld 99 65 - 99 mg/dL   Calcium, Ion 1.22 1.15 - 1.40 mmol/L   TCO2 28 0 - 100 mmol/L   Hemoglobin 13.6 13.0 - 17.0 g/dL   HCT 40.0 39.0 - 52.0 %   No results found for this or any previous visit (from the past 240 hour(s)). Creatinine:  Recent Labs  04/22/16 1136  CREATININE 1.40*   Baseline Creatinine: 1.4  Impression/Assessment:  80yo with right renal mass  Plan:  The risks/benefits/alternatives to right ureteroscopy with possile biopsy was explained to the patient and he understands and wishes to proceed with surgery  Nicolette Bang 04/22/2016, 12:34 PM

## 2016-05-13 NOTE — Progress Notes (Addendum)
2 CHG cloths used to wipe patient's abdomen preop. Redness noted on right lower back, no redness noted on abdomen. Area not raised, no drainage. Patient complained of itching initially, but states itching subsided within minutes. Denies reaction when using CHG baths at home. Reassessed after 15 minutes.Minimal redness noted, patient denies itching.

## 2016-05-13 NOTE — Anesthesia Preprocedure Evaluation (Signed)
Anesthesia Evaluation  Patient identified by MRN, date of birth, ID band Patient awake    Reviewed: Allergy & Precautions, NPO status , Patient's Chart, lab work & pertinent test results  Airway Mallampati: II  TM Distance: >3 FB Neck ROM: Full    Dental no notable dental hx.    Pulmonary former smoker,    Pulmonary exam normal breath sounds clear to auscultation       Cardiovascular hypertension, + Peripheral Vascular Disease  negative cardio ROS Normal cardiovascular exam Rhythm:Regular Rate:Normal     Neuro/Psych PSYCHIATRIC DISORDERS Anxiety Depression  Neuromuscular disease    GI/Hepatic Neg liver ROS, hiatal hernia, GERD  ,  Endo/Other  negative endocrine ROS  Renal/GU Renal disease     Musculoskeletal  (+) Arthritis ,   Abdominal   Peds  Hematology negative hematology ROS (+)   Anesthesia Other Findings   Reproductive/Obstetrics                             Lab Results  Component Value Date   WBC 6.9 05/09/2016   HGB 13.1 05/09/2016   HCT 39.3 05/09/2016   MCV 94.9 05/09/2016   PLT 175 05/09/2016   Lab Results  Component Value Date   CREATININE 1.54 (H) 05/09/2016   BUN 23 (H) 05/09/2016   NA 138 05/09/2016   K 4.2 05/09/2016   CL 103 05/09/2016   CO2 28 05/09/2016    Anesthesia Physical  Anesthesia Plan  ASA: II  Anesthesia Plan: General   Post-op Pain Management:    Induction: Intravenous  Airway Management Planned: Oral ETT  Additional Equipment:   Intra-op Plan:   Post-operative Plan: Extubation in OR  Informed Consent: I have reviewed the patients History and Physical, chart, labs and discussed the procedure including the risks, benefits and alternatives for the proposed anesthesia with the patient or authorized representative who has indicated his/her understanding and acceptance.   Dental advisory given  Plan Discussed with: CRNA  Anesthesia  Plan Comments:         Anesthesia Quick Evaluation

## 2016-05-13 NOTE — Interval H&P Note (Signed)
History and Physical Interval Note:  05/13/2016 12:22 PM  Jeffrey Hardy  has presented today for surgery, with the diagnosis of RIGHT RENAL MASS  The various methods of treatment have been discussed with the patient and family. After consideration of risks, benefits and other options for treatment, the patient has consented to  Procedure(s): XI ROBOT ASSITED LAPAROSCOPIC NEPHROURETERECTOMY (Right) as a surgical intervention .  The patient's history has been reviewed, patient examined, no change in status, stable for surgery.  I have reviewed the patient's chart and labs.  Questions were answered to the patient's satisfaction.     Nicolette Bang

## 2016-05-13 NOTE — Transfer of Care (Signed)
Immediate Anesthesia Transfer of Care Note  Patient: Jeffrey Hardy  Procedure(s) Performed: Procedure(s): XI ROBOT ASSITED LAPAROSCOPIC NEPHROURETERECTOMY (Right)  Patient Location: PACU  Anesthesia Type:General  Level of Consciousness: sedated  Airway & Oxygen Therapy: Patient Spontanous Breathing and Patient connected to face mask oxygen  Post-op Assessment: Report given to RN and Post -op Vital signs reviewed and stable  Post vital signs: Reviewed and stable  Last Vitals:  Vitals:   05/13/16 0842 05/13/16 1615  BP: 130/71   Pulse: 96 96  Resp: 18 18  Temp: 36.5 C 36.7 C    Last Pain:  Vitals:   05/13/16 0842  TempSrc: Oral      Patients Stated Pain Goal: 3 ([id number])  Complications: No apparent anesthesia complications

## 2016-05-13 NOTE — Discharge Instructions (Signed)

## 2016-05-13 NOTE — Brief Op Note (Signed)
05/13/2016  3:47 PM  PATIENT:  Jeffrey Hardy  81 y.o. male  PRE-OPERATIVE DIAGNOSIS:  RIGHT RENAL MASS  POST-OPERATIVE DIAGNOSIS:  RIGHT RENAL MASS  PROCEDURE:  Procedure(s): XI ROBOT ASSITED LAPAROSCOPIC NEPHROURETERECTOMY (Right)  SURGEON:  Surgeon(s) and Role:    * [name], MD - Primary  PHYSICIAN ASSISTANT: Debbrah Alar. PA  ASSISTANTS: Virginia Crews, MD   ANESTHESIA:   general  EBL:  Total I/O In: 2250 [I.V.:2000; IV Piggyback:250] Out: 600 [Urine:250; Blood:350]  BLOOD ADMINISTERED:none  DRAINS: Urinary Catheter (Foley)   LOCAL MEDICATIONS USED:  NONE  SPECIMEN:  Source of Specimen:  right kindey and partial ureter  DISPOSITION OF SPECIMEN:  PATHOLOGY  COUNTS:  YES  TOURNIQUET:  * No tourniquets in log *  DICTATION: .Note written in EPIC  PLAN OF CARE: Admit to inpatient   PATIENT DISPOSITION:  PACU - hemodynamically stable.   Delay start of Pharmacological VTE agent (>24hrs) due to surgical blood loss or risk of bleeding: yes

## 2016-05-14 ENCOUNTER — Encounter (HOSPITAL_COMMUNITY): Payer: Self-pay | Admitting: Urology

## 2016-05-14 LAB — BASIC METABOLIC PANEL
Anion gap: 7 (ref 5–15)
BUN: 24 mg/dL — ABNORMAL HIGH (ref 6–20)
CO2: 27 mmol/L (ref 22–32)
Calcium: 8.5 mg/dL — ABNORMAL LOW (ref 8.9–10.3)
Chloride: 101 mmol/L (ref 101–111)
Creatinine, Ser: 1.72 mg/dL — ABNORMAL HIGH (ref 0.61–1.24)
GFR calc Af Amer: 42 mL/min — ABNORMAL LOW (ref >60)
GFR calc non Af Amer: 36 mL/min — ABNORMAL LOW (ref >60)
Glucose, Bld: 156 mg/dL — ABNORMAL HIGH (ref 65–99)
Potassium: 4.2 mmol/L (ref 3.5–5.1)
Sodium: 135 mmol/L (ref 135–145)

## 2016-05-14 LAB — HEMOGLOBIN AND HEMATOCRIT, BLOOD
HCT: 29.2 % — ABNORMAL LOW (ref 39.0–52.0)
Hemoglobin: 10.1 g/dL — ABNORMAL LOW (ref 13.0–17.0)

## 2016-05-14 MED ORDER — BACLOFEN 1 MG/ML ORAL SUSPENSION
2.5000 mg | Freq: Once | ORAL | Status: DC
  Filled 2016-05-14: qty 0.25

## 2016-05-14 MED ORDER — CALCIUM CARBONATE ANTACID 500 MG PO CHEW
1.0000 | CHEWABLE_TABLET | Freq: Two times a day (BID) | ORAL | Status: DC
  Administered 2016-05-14 – 2016-05-16 (×5): 200 mg via ORAL
  Filled 2016-05-14 (×5): qty 1

## 2016-05-14 MED ORDER — KCL IN DEXTROSE-NACL 20-5-0.45 MEQ/L-%-% IV SOLN
INTRAVENOUS | Status: DC
  Administered 2016-05-14 – 2016-05-15 (×3): via INTRAVENOUS
  Filled 2016-05-14 (×4): qty 1000

## 2016-05-14 MED ORDER — GABAPENTIN 300 MG PO CAPS
300.0000 mg | ORAL_CAPSULE | Freq: Once | ORAL | Status: AC
  Administered 2016-05-14: 300 mg via ORAL
  Filled 2016-05-14: qty 1

## 2016-05-14 MED ORDER — SIMETHICONE 80 MG PO CHEW
80.0000 mg | CHEWABLE_TABLET | Freq: Once | ORAL | Status: AC
  Administered 2016-05-14: 80 mg via ORAL
  Filled 2016-05-14: qty 1

## 2016-05-14 MED ORDER — ENSURE ENLIVE PO LIQD
237.0000 mL | Freq: Two times a day (BID) | ORAL | Status: DC
  Administered 2016-05-14 – 2016-05-16 (×4): 237 mL via ORAL

## 2016-05-14 MED ORDER — DOCUSATE SODIUM 100 MG PO CAPS
100.0000 mg | ORAL_CAPSULE | Freq: Two times a day (BID) | ORAL | Status: DC
  Administered 2016-05-14 – 2016-05-16 (×5): 100 mg via ORAL
  Filled 2016-05-14 (×5): qty 1

## 2016-05-14 MED ORDER — CHLORPROMAZINE HCL 25 MG PO TABS: 25.0000 mg | ORAL_TABLET | Freq: Once | ORAL | Status: DC

## 2016-05-14 MED ORDER — PROMETHAZINE HCL 25 MG PO TABS
12.5000 mg | ORAL_TABLET | Freq: Four times a day (QID) | ORAL | Status: DC | PRN
  Administered 2016-05-14 – 2016-05-16 (×4): 12.5 mg via ORAL
  Filled 2016-05-14 (×4): qty 1

## 2016-05-14 MED ORDER — SENNA 8.6 MG PO TABS
1.0000 | ORAL_TABLET | Freq: Two times a day (BID) | ORAL | Status: DC
  Administered 2016-05-14 – 2016-05-16 (×5): 8.6 mg via ORAL
  Filled 2016-05-14 (×5): qty 1

## 2016-05-14 NOTE — Progress Notes (Signed)
1 Day Post-Op Subjective: The patient is doing well.  No nausea or vomiting. Pain is adequately controlled. Has experienced intractable hiccups all night without improvement. Also had trouble sleeping. Slight drop in hemoglobin, not unanticipated.  Objective: Vital signs in last 24 hours: Temp:  [97.7 F (36.5 C)-99 F (37.2 C)] 98.9 F (37.2 C) (02/06 0423) Pulse Rate:  [74-97] 94 (02/06 0423) Resp:  [11-18] 16 (02/06 0221) BP: (108-145)/(54-76) 133/63 (02/06 0423) SpO2:  [99 %-100 %] 99 % (02/06 0423) Weight:  [86.2 kg (190 lb)] 86.2 kg (190 lb) (02/05 0907)  Intake/Output from previous day: 02/05 0701 - 02/06 0700 In: 4228.3 [I.V.:3978.3; IV Piggyback:250] Out: 1050 [Urine:700; Blood:350] Intake/Output this shift: No intake/output data recorded.  Physical Exam:  General: Alert and oriented. CV: RRR Lungs: Clear bilaterally. GI: Soft, Nondistended. Incisions: Clean and dry. Urine: Clear Extremities: Nontender, no erythema, no edema.  Lab Results:  Recent Labs  05/13/16 1716 05/14/16 0529  HGB 11.7* 10.1*  HCT 33.9* 29.2*          Recent Labs  05/09/16 1147 05/14/16 0529  CREATININE 1.54* 1.72*           Results for orders placed or performed during the hospital encounter of 05/13/16 (from the past 24 hour(s))  Prepare RBC     Status: None   Collection Time: 05/13/16  3:03 PM  Result Value Ref Range   Order Confirmation ORDER PROCESSED BY BLOOD BANK   Hemoglobin and hematocrit, blood     Status: Abnormal   Collection Time: 05/13/16  5:16 PM  Result Value Ref Range   Hemoglobin 11.7 (L) 13.0 - 17.0 g/dL   HCT 33.9 (L) 39.0 - [id number] %  Basic metabolic panel     Status: Abnormal   Collection Time: 05/14/16  5:29 AM  Result Value Ref Range   Sodium 135 135 - 145 mmol/L   Potassium 4.2 3.5 - 5.1 mmol/L   Chloride 101 101 - 111 mmol/L   CO2 27 22 - 32 mmol/L   Glucose, Bld 156 (H) 65 - 99 mg/dL   BUN 24 (H) 6 - 20 mg/dL   Creatinine, Ser 1.72 (H) 0.61 -  1.24 mg/dL   Calcium 8.5 (L) 8.9 - 10.3 mg/dL   GFR calc non Af Amer 36 (L) >60 mL/min   GFR calc Af Amer 42 (L) >60 mL/min   Anion gap 7 5 - 15  Hemoglobin and hematocrit, blood     Status: Abnormal   Collection Time: 05/14/16  5:29 AM  Result Value Ref Range   Hemoglobin 10.1 (L) 13.0 - 17.0 g/dL   HCT 29.2 (L) 39.0 - 52.0 %    Assessment/Plan: POD# 1 s/p robotic right nephrectomy and partial ureterectomy.  1) Ambulate, Incentive spirometry 2) Advance diet as tolerated 3) Transition to oral pain medication 4) Renally dosed baclofen, simethicone for hiccups 5) D/C urethral catheter     LOS: 1 day   Lolita Rieger 05/14/2016, 8:43 AM

## 2016-05-14 NOTE — Progress Notes (Signed)
Assumed care of patient at this time.  Agree with previously charted assessment from this shift. Will continue to monitor and assess.  Coolidge Breeze, RN 05/14/2016

## 2016-05-14 NOTE — Anesthesia Postprocedure Evaluation (Signed)
Anesthesia Post Note  Patient: Orestes L Ellerman  Procedure(s) Performed: Procedure(s) (LRB): XI ROBOT ASSITED LAPAROSCOPIC NEPHROURETERECTOMY (Right)  Patient location during evaluation: PACU Anesthesia Type: General Level of consciousness: awake and alert and patient cooperative Pain management: pain level controlled Vital Signs Assessment: post-procedure vital signs reviewed and stable Respiratory status: spontaneous breathing and respiratory function stable Cardiovascular status: stable Anesthetic complications: no       Last Vitals:  Vitals:   05/14/16 0221 05/14/16 0423  BP: (!) 108/54 133/63  Pulse: 74 94  Resp: 16   Temp: 37.2 C 37.2 C    Last Pain:  Vitals:   05/14/16 0451  TempSrc:   PainSc: Gamaliel

## 2016-05-14 NOTE — Progress Notes (Signed)
Initial Nutrition Assessment  DOCUMENTATION CODES:   Not applicable  INTERVENTION:   Ensure Enlive po BID, each supplement provides 350 kcal and 20 grams of protein  Boost Breeze po TID, each supplement provides 250 kcal and 9 grams of protein   NUTRITION DIAGNOSIS:   Inadequate oral intake related to poor appetite as evidenced by meal completion < 25%.  GOAL:   Patient will meet greater than or equal to 90% of their needs  MONITOR:   PO intake, Supplement acceptance, Weight trends, Labs, Skin  REASON FOR ASSESSMENT:   Malnutrition Screening Tool    ASSESSMENT:   81 y/o with gross hematuria and a newly diagnosed central renal mass s/p robotic right nephrectomy and partial ureterectomy.  Met with pt in room today. Pt reports good appetite pta but is only eating 25% meals in hospital. Pt has intractable hiccups today that he cant get rid of. Per chart, pt has lost 15lbs(7%) in 1 year and 8lbs(4%) in 2 months. This is not a significant amount of weight loss for the time frame. RD discussed with pt the importance of adequate protein intake for healing. Pt is willing to try Ensure.   Medications reviewed and include: Tums, colace, senokot, hydrocodone, hydromorphone, zofran,  Phenergan   Labs reviewed: BUN 24(H), creat 1.72(H), Ca 8.5(L)  Nutrition-Focused physical exam completed. Findings are no fat depletion, no muscle depletion, and no edema.   Diet Order:  Diet regular Room service appropriate? Yes; Fluid consistency: Thin  Skin:  Wound (see comment) (incision abdomen )  Last BM:  2/5  Height:   Ht Readings from Last 1 Encounters:  05/13/16 '6\' 1"'  (1.854 m)    Weight:   Wt Readings from Last 1 Encounters:  05/13/16 190 lb (86.2 kg)    Ideal Body Weight:  83.6 kg  BMI:  Body mass index is 25.07 kg/m.  Estimated Nutritional Needs:   Kcal:  2200-2500kcal/day   Protein:  112-129g/day   Fluid:  >2L/day   EDUCATION NEEDS:   No education needs  identified at this time  Koleen Distance, RD, LDN Pager #[phone]

## 2016-05-15 LAB — TYPE AND SCREEN
Blood Product Expiration Date: 201803052359
Blood Product Expiration Date: 201803072359
Unit Type and Rh: 7300
Unit Type and Rh: 7300

## 2016-05-15 NOTE — Progress Notes (Signed)
2 Days Post-Op Subjective: Patient reports passing flatus, tolerating PO and pain control good.  Objective: Vital signs in last 24 hours: Temp:  [98 F (36.7 C)-98.6 F (37 C)] 98.2 F (36.8 C) (02/07 0634) Pulse Rate:  [67-81] 69 (02/07 0634) Resp:  [18-20] 20 (02/07 0634) BP: (114-136)/(57-68) 131/60 (02/07 0634) SpO2:  [94 %-100 %] 94 % (02/07 0634)  Intake/Output from previous day: 02/06 0701 - 02/07 0700 In: 1140 [P.O.:840; I.V.:300] Out: 2050 [Urine:2050] Intake/Output this shift: Total I/O In: 2738.3 [P.O.:480; I.V.:2258.3] Out: -   Physical Exam:  General:alert, cooperative and appears stated age GI: soft, non tender, normal bowel sounds, no palpable masses, no organomegaly, no inguinal hernia Male genitalia: not done Extremities: extremities normal, atraumatic, no cyanosis or edema  Lab Results:  Recent Labs  05/13/16 1716 05/14/16 0529  HGB 11.7* 10.1*  HCT 33.9* 29.2*   BMET  Recent Labs  05/14/16 0529  NA 135  K 4.2  CL 101  CO2 27  GLUCOSE 156*  BUN 24*  CREATININE 1.72*  CALCIUM 8.5*   No results for input(s): LABPT, INR in the last 72 hours. No results for input(s): LABURIN in the last 72 hours. Results for orders placed or performed in visit on 03/08/16  Urine culture     Status: None   Collection Time: 03/08/16  9:06 AM  Result Value Ref Range Status   Organism ID, Bacteria NO GROWTH  Final    Studies/Results: No results found.  Assessment/Plan: POD#2 right robotic nephroureterectomy  1. Advanced iet 2. SLIVF 3. Likely discharge tomorrow   LOS: 2 days   Nicolette Bang 05/15/2016, 1:49 PM

## 2016-05-16 LAB — BASIC METABOLIC PANEL
Anion gap: 8 (ref 5–15)
BUN: 28 mg/dL — ABNORMAL HIGH (ref 6–20)
CO2: 28 mmol/L (ref 22–32)
Calcium: 8.9 mg/dL (ref 8.9–10.3)
Chloride: 100 mmol/L — ABNORMAL LOW (ref 101–111)
Creatinine, Ser: 1.9 mg/dL — ABNORMAL HIGH (ref 0.61–1.24)
GFR calc Af Amer: 37 mL/min — ABNORMAL LOW (ref >60)
GFR calc non Af Amer: 32 mL/min — ABNORMAL LOW (ref >60)
Glucose, Bld: 133 mg/dL — ABNORMAL HIGH (ref 65–99)
Potassium: 4.2 mmol/L (ref 3.5–5.1)
Sodium: 136 mmol/L (ref 135–145)

## 2016-05-16 NOTE — Progress Notes (Addendum)
05/16/16 1330  Reviewed discharge instructions with patient and his wife. Patient verbalized understanding of discharge instructions. Copy of discharge instructions and prescription given to patient. Per Dr Alyson Ingles patient may restart Eliquis tomorrow 5mg  BID. patient is aware.

## 2016-05-20 ENCOUNTER — Encounter: Payer: Self-pay | Admitting: Family Medicine

## 2016-05-20 ENCOUNTER — Ambulatory Visit (INDEPENDENT_AMBULATORY_CARE_PROVIDER_SITE_OTHER): Payer: PPO | Admitting: Family Medicine

## 2016-05-20 VITALS — BP 126/50 | HR 62 | Temp 97.9°F | Ht 73.0 in | Wt 187.0 lb

## 2016-05-20 DIAGNOSIS — R066 Hiccough: Secondary | ICD-10-CM

## 2016-05-20 MED ORDER — BACLOFEN 10 MG PO TABS: 5.0000 mg | ORAL_TABLET | Freq: Two times a day (BID) | ORAL | 0 refills | Status: AC

## 2016-05-20 NOTE — Progress Notes (Signed)
Chief Complaint  Patient presents with  . Hiccups    since last week    Subjective: Patient is a 81 y.o. male here for hiccups. Here with his wife.  Pt had a nephrectomy 1 week ago. Since that procedure, he has been having largely continuous hiccupping. On one occasion, he had such a rapid succession of hiccupping, it affected his breathing. He is not currently short of breath and has no pain or fevers. He has not tried any medicine for this other than a single dose of an unknown substance while he was in the hospital.   ROS: Lungs: Denies SOB or cough, +hiccups  Family History  Problem Relation Age of Onset  . Heart Problems Mother   . Brain cancer Father   . Parkinsonism Brother   . Dementia Brother   . Colon cancer Neg Hx   . Esophageal cancer Neg Hx   . Stomach cancer Neg Hx   . Gastric cancer Neg Hx   . Liver disease Neg Hx   . Kidney disease Neg Hx   . Diabetes Neg Hx    Past Medical History:  Diagnosis Date  . Anxiety   . Bilateral renal cysts   . Cancer ([place])    renal mass  . Depression   . Diverticulosis of colon   . Erectile dysfunction   . Hematuria   . History of cerebral embolic infarction x2 CVA's ---  neurologist-  [name]--- per pt residual abnormal gait , does need cane   12-16-2011  right frontal embolic infarct and [id number] left frontal embolic infarct (per first MRI previous hemorrgaic cerebral ischemia)  . History of duodenal ulcer    1997  . History of Helicobacter pylori infection    1997  . History of hiatal hernia   . History of loop recorder    Current  . History of syncope    1997  and 2014  s/p  loop recorder  . Hyperlipidemia   . Hypertension   . Nocturia   . PAF (paroxysmal atrial fibrillation) [facility])    cardiologist-  dr [name]  . Right renal mass   . Status post placement of implantable loop recorder    07-06-2012  . Stroke [facility])    2013 2 mild strokes mild weakness bil sides  . Urgency of urination   . Wears hearing aid    right ear only   Allergies  Allergen Reactions  . Acyclovir And Related     Current Outpatient Prescriptions:  .  apixaban (ELIQUIS) 5 MG TABS tablet, Take 1 tablet (5 mg total) by mouth 2 (two) times daily. (Patient taking differently: Take 5 mg by mouth daily. ), Disp: 60 tablet, Rfl: 6 .  lisinopril (PRINIVIL,ZESTRIL) 5 MG tablet, TAKE ONE (1) TABLET EACH DAY (Patient taking differently: Take 5 mg by mouth daily. TAKE ONE (1) TABLET EACH DAY), Disp: 90 tablet, Rfl: 3 .  tamsulosin (FLOMAX) 0.4 MG CAPS capsule, Take 0.4 mg by mouth daily after breakfast., Disp: , Rfl:  .  baclofen (LIORESAL) 10 MG tablet, Take 0.5-1 tablets (5-10 mg total) by mouth 2 (two) times daily., Disp: 20 tablet, Rfl: 0 .  HYDROcodone-acetaminophen (NORCO) 5-325 MG tablet, Take 1-2 tablets by mouth every 6 (six) hours as needed for moderate pain or severe pain. (Patient not taking: Reported on 05/20/2016), Disp: 30 tablet, Rfl: 0  Objective: BP (!) 126/50 (BP Location: Left Arm, Patient Position: Sitting, Cuff Size: Normal)   Pulse 62   Temp  97.9 F (36.6 C) (Oral)   Ht 6\' 1"  (1.854 m)   Wt 187 lb (84.8 kg)   SpO2 (!) 89%   BMI 24.67 kg/m  General: Awake, appears stated age Heart: RRR, no murmurs Lungs: CTAB, no rales, wheezes or rhonchi. No accessory muscle use Skin: central abd incision and port sites are c/d/i without erythema or drainage. Psych: Age appropriate judgment and insight, normal affect and mood  Assessment and Plan: [place] - Plan: baclofen (LIORESAL) 10 MG tablet  Orders as above. 5-10 mg per dose, warned that this could cause drowsiness. Ok to try ice water gargles, gentle pressure to eyes, breath holding, and pulling knees to chest.  F/u as originally scheduled with reg PCP in 2 weeks. The patient and his wife voiced understanding and agreement to the plan.  Nixa, DO 05/20/16  2:35 PM

## 2016-05-20 NOTE — Discharge Summary (Signed)
Physician Discharge Summary  Patient ID: Jeffrey Hardy MRN: [id number] DOB/AGE: 11-02-1935 81 y.o.  Admit date: 05/13/2016 Discharge date: 05/16/2016  Admission Diagnoses: Renal mass Discharge Diagnoses:  Active Problems:   Renal mass   Discharged Condition: good  Hospital Course: The patient tolerated the procedure well and was transferred to the floor on IV pain meds, IV fluid. On POD#1 foley was removed, pt was started on clear liquid diet and they ambulated in the halls. On POD#2 the patient was transitioned to a regular diet, IVFs were discontinued, and the patient passed flatus. Prior to discharge the pt was tolerating a regular diet, pain was controlled on PO pain meds, they were ambulating without difficulty, and they had normal bowel function.   Consults: None  Significant Diagnostic Studies: none  Treatments: surgery: right robotic radical nephrouretectomy   Discharge Exam: Blood pressure (!) 141/79, pulse 98, temperature 98.9 F (37.2 C), temperature source Oral, resp. rate 20, height 6\' 1"  (1.854 m), weight 86.2 kg (190 lb), SpO2 98 %. General appearance: alert, cooperative and appears stated age Head: Normocephalic, without obvious abnormality, atraumatic Nose: Nares normal. Septum midline. Mucosa normal. No drainage or sinus tenderness. Neck: no adenopathy, no carotid bruit, no JVD, supple, symmetrical, trachea midline and thyroid not enlarged, symmetric, no tenderness/mass/nodules Resp: clear to auscultation bilaterally Cardio: regular rate and rhythm, S1, S2 normal, no murmur, click, rub or gallop GI: soft, non-tender; bowel sounds normal; no masses,  no organomegaly Extremities: extremities normal, atraumatic, no cyanosis or edema Neurologic: Alert and oriented X 3, normal strength and tone. Normal symmetric reflexes. Normal coordination and gait  Disposition: 01-Home or Self Care  Discharge Instructions    Discharge patient    Complete by:  As directed    Discharge disposition:  01-Home or Self Care   Discharge patient date:  05/16/2016     Allergies as of 05/16/2016      Reactions   Acyclovir And Related       Medication List    STOP taking these medications   traMADol 50 MG tablet Commonly known as:  ULTRAM     TAKE these medications   apixaban 5 MG Tabs tablet Commonly known as:  ELIQUIS Take 1 tablet (5 mg total) by mouth 2 (two) times daily. What changed:  when to take this   HYDROcodone-acetaminophen 5-325 MG tablet Commonly known as:  NORCO Take 1-2 tablets by mouth every 6 (six) hours as needed for moderate pain or severe pain.   lisinopril 5 MG tablet Commonly known as:  PRINIVIL,ZESTRIL TAKE ONE (1) TABLET EACH DAY What changed:  how much to take  how to take this  when to take this  additional instructions   tamsulosin 0.4 MG Caps capsule Commonly known as:  FLOMAX Take 0.4 mg by mouth daily after breakfast.      Follow-up Information    Nicolette Bang, MD On 06/03/2016.   Specialty:  Urology Why:  at 8:00 Contact information: [address] [place] [address] [phone]           Signed: Nicolette Bang 05/20/2016, 3:58 PM

## 2016-05-20 NOTE — Patient Instructions (Addendum)
Things to try at home: Holding breath, gentle pressure on eyes, gargling salt water, bring knees into chest and holding it.  Seek care if you start having shortness of breath.

## 2016-05-20 NOTE — Op Note (Signed)
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  Right robot assisted laparoscopic radical nephroureterectomy 2.  Lysis of adhesions, moderate   Attending: Nicolette Bang  Assistant: Debbrah Alar  Anesthesia: General  Estimated blood loss: 250 cc  Drains: 16 French Foley catheter, JP drain  Specimens: right radical nephroureterectomy to the iliac vessels  Antibiotics: ancef  Findings: Multiple anterior wall adhesions. Dense adhesion to the iliac vessel requiring ligation of the ureter at the iliac vessels.  Indications: Patient is a 81 year old with a history of high grade ureteral TCC.  [name]  After discussing treatment options patient decided to proceed with right robot assisted laparoscopic radical nephroureterectomy  Procedure in detail: Prior to procedure consent was obtained. Patient was brought to the operating room and briefing was done sure correct patient, correct procedure, correct site.  General anesthesia was in administered patient was placed in the left lateral decubitus position. A 16 French catheter was placed. their abdomen and flank was then prepped and draped usual sterile fashion.  A Veress needle was used to obtain pneumoperitoneum.  Once pneumoperitoneum was reestablished to 15 mmHg we then placed a 8 mm camera port lateral to the umbilicus at the latera; edge of rectus.  We then proceeded to place 4 more robotic ports. We then placed 2 assistant ports. We then docked the robot.  We then started this dissection by removing a extensive amount of anterior abdominal wall adhesions.  We then dissected along the white line of Toldt.  We then reflected the colon medially.  We then kocherized the duodeum. We then identified the psoas muscle.  Once this was done we traced it down to the iliac vessels and identified the ureter.  Once we identified the gonadal vein and ureter were then traced this to the  renal hilum.  The renal vein and renal artery were skeletonized.  We did we identified one renal vein one renal artery.  Using the [employer] power stapler within ligated the renal artery.  Once this was done we then used a second staple load to ligate the renal vein.  Once this was done we then freed the kidney from its lateral and posterior attachments. Once the kidney was freed we then proceeded to dissect the ureter further. We dissected it down to where it crossed the iliac vessels. We were unable to dissect the ureter off of the vessels so we ligated the ureter at this point.  We then used a Endo Catch bag to remove the specimen.  Once the specimen was in the Endo Catch bag we then inspected the retroperitoneum and noted no residual bleeding.  We then removed our instruments, undocked the robot, and released the pneumoperitoneum.  We then made a midline incision above the umbilicus to remove the specimen.  Once the specimen was removed we then closed the camera and assistant ports with 0 Vicryl in interrupted fashion.  We then closed the midline incision with looped PDS in a running fashion.  We then closed the overlying skin with 2-0 Vicryl in running fashion.  These skin was then subcuticularly closed with 4-0 Monocryl.  We then placed Dermabond over all the incisions.  This included the procedure which resulted by the patient.  Complications: None  Condition: Stable, x-rayed, transferred to PACU.  Plan: Patient is to be admitted for inpatient stay. They will be started on a clear liquid diet POD#1. His foley will be removed POD#1

## 2016-05-20 NOTE — Progress Notes (Signed)
Pre visit review using our clinic review tool, if applicable. No additional management support is needed unless otherwise documented below in the visit note. 

## 2016-06-03 DIAGNOSIS — C641 Malignant neoplasm of right kidney, except renal pelvis: Secondary | ICD-10-CM | POA: Diagnosis not present

## 2016-06-10 ENCOUNTER — Ambulatory Visit (HOSPITAL_COMMUNITY)
Admission: RE | Admit: 2016-06-10 | Discharge: 2016-06-10 | Disposition: A | Discharge status: Home / Self Care | Payer: PPO | Source: Ambulatory Visit | Attending: Nurse Practitioner | Admitting: Nurse Practitioner

## 2016-06-10 ENCOUNTER — Other Ambulatory Visit (HOSPITAL_COMMUNITY): Payer: Self-pay | Admitting: *Deleted

## 2016-06-10 ENCOUNTER — Encounter (HOSPITAL_COMMUNITY): Payer: Self-pay | Admitting: Nurse Practitioner

## 2016-06-10 VITALS — BP 104/74 | HR 65 | Ht 73.0 in | Wt 188.8 lb

## 2016-06-10 DIAGNOSIS — Z8673 Personal history of transient ischemic attack (TIA), and cerebral infarction without residual deficits: Secondary | ICD-10-CM | POA: Insufficient documentation

## 2016-06-10 DIAGNOSIS — Z87891 Personal history of nicotine dependence: Secondary | ICD-10-CM | POA: Insufficient documentation

## 2016-06-10 DIAGNOSIS — Z9889 Other specified postprocedural states: Secondary | ICD-10-CM | POA: Diagnosis not present

## 2016-06-10 DIAGNOSIS — F419 Anxiety disorder, unspecified: Secondary | ICD-10-CM | POA: Insufficient documentation

## 2016-06-10 DIAGNOSIS — Z820 Family history of epilepsy and other diseases of the nervous system: Secondary | ICD-10-CM | POA: Diagnosis not present

## 2016-06-10 DIAGNOSIS — Z7901 Long term (current) use of anticoagulants: Secondary | ICD-10-CM | POA: Insufficient documentation

## 2016-06-10 DIAGNOSIS — K449 Diaphragmatic hernia without obstruction or gangrene: Secondary | ICD-10-CM | POA: Insufficient documentation

## 2016-06-10 DIAGNOSIS — Z8619 Personal history of other infectious and parasitic diseases: Secondary | ICD-10-CM | POA: Diagnosis not present

## 2016-06-10 DIAGNOSIS — I480 Paroxysmal atrial fibrillation: Secondary | ICD-10-CM | POA: Diagnosis not present

## 2016-06-10 DIAGNOSIS — Z888 Allergy status to other drugs, medicaments and biological substances status: Secondary | ICD-10-CM | POA: Diagnosis not present

## 2016-06-10 DIAGNOSIS — F329 Major depressive disorder, single episode, unspecified: Secondary | ICD-10-CM | POA: Diagnosis not present

## 2016-06-10 DIAGNOSIS — Z808 Family history of malignant neoplasm of other organs or systems: Secondary | ICD-10-CM | POA: Diagnosis not present

## 2016-06-10 DIAGNOSIS — Z85528 Personal history of other malignant neoplasm of kidney: Secondary | ICD-10-CM | POA: Diagnosis not present

## 2016-06-10 DIAGNOSIS — I10 Essential (primary) hypertension: Secondary | ICD-10-CM | POA: Insufficient documentation

## 2016-06-10 DIAGNOSIS — E785 Hyperlipidemia, unspecified: Secondary | ICD-10-CM | POA: Diagnosis not present

## 2016-06-10 DIAGNOSIS — Z79899 Other long term (current) drug therapy: Secondary | ICD-10-CM | POA: Diagnosis not present

## 2016-06-10 MED ORDER — APIXABAN 5 MG PO TABS: 5.0000 mg | ORAL_TABLET | Freq: Two times a day (BID) | ORAL | 6 refills | Status: DC

## 2016-06-10 NOTE — Progress Notes (Signed)
Patient ID: Jeffrey Hardy, male   DOB: July 16, 1935, 81 y.o.   MRN: [id number]     Primary Care Physician: Cathlean Cower, MD Referring Physician: Dr. Jolyn Nap   Jeffrey Hardy is a 81 y.o. male with a h/o cryptogenic stroke  9/13. He had a Linq monitor implanted in 2014. He has been on aggrenox since his stroke in 2013. His linq recently showed afib and he was sent here by Dr. [name] to discuss anticoagulation. He is not aware of irregular heart beat. His chadsvasc score is 5.He was started on eliquis and aggrenox was stopped.  He returns to the afib clinic 5/23. No issues with eliquis, no bleeding issues. Not aware of any irregular heart beat.   He was seen by Dr. [name] 11/09/15 and Linq was ERI and he elected to leave in place. No c/o of irregular heart beat.  F/U afib clinic 06/10/16. He continues on apixaban. No bleeding issues. Last INR was 1.90 05/16/16 , but is still on appropriate dose with age being less than 18 and weight at 187. But at age 59, he will have to reduce dose to 2.5 mg bid if renal function stays over 1.5. He had nephrectomy 2 weeks ago for renal mass which was cancerous but will not have to have any chem or radiation. He reports no afib as far as he know since that time. His nephrologist will be checking labs every 3 months for the next year. No further hematuria since surgery.  Today, he denies symptoms of palpitations, chest pain, shortness of breath, orthopnea, PND, lower extremity edema, dizziness, presyncope, syncope, or neurologic sequela. The patient is tolerating medications without difficulties and is otherwise without complaint today.   Past Medical History:  Diagnosis Date  . Anxiety   . Bilateral renal cysts   . Cancer ([place])    renal mass  . Depression   . Diverticulosis of colon   . Erectile dysfunction   . Hematuria   . History of cerebral embolic infarction x2 CVA's ---  neurologist-  [name]--- per pt residual abnormal gait , does need cane   12-16-2011   right frontal embolic infarct and [id number] left frontal embolic infarct (per first MRI previous hemorrgaic cerebral ischemia)  . History of duodenal ulcer    1997  . History of Helicobacter pylori infection    1997  . History of hiatal hernia   . History of loop recorder    Current  . History of syncope    1997  and 2014  s/p  loop recorder  . Hyperlipidemia   . Hypertension   . Nocturia   . PAF (paroxysmal atrial fibrillation) [facility])    cardiologist-  dr [name]  . Right renal mass   . Status post placement of implantable loop recorder    07-06-2012  . Stroke [facility])    2013 2 mild strokes mild weakness bil sides  . Urgency of urination   . Wears hearing aid    right ear only   Past Surgical History:  Procedure Laterality Date  . APPENDECTOMY  age 39  . COLONOSCOPY  last one 07-17-2015  . CYSTOSCOPY/RETROGRADE/URETEROSCOPY Right 04/22/2016   Procedure: CYSTOSCOPY/RETROGRADE/URETEROSCOPY/ BIOPSY/ STENT PLACEMENT;  Surgeon: [name], MD;  Location[facility];  Service: Urology;  Laterality: Right;  . ESOPHAGOGASTRODUODENOSCOPY  last one 09-16-2014  . LOOP RECORDER IMPLANT N/A 07/06/2012   Procedure: LOOP RECORDER IMPLANT;  Surgeon: [name], MD;  Location: [facility] CATH  LAB;  Service: Cardiovascular;  Laterality: N/A;  . ROBOT ASSITED LAPAROSCOPIC NEPHROURETERECTOMY Right 05/13/2016   Procedure: XI ROBOT ASSITED LAPAROSCOPIC NEPHROURETERECTOMY;  Surgeon: [name], MD;  [facility];  Service: Urology;  Laterality: Right;  . TEE WITHOUT CARDIOVERSION  12/19/2011   Procedure: TRANSESOPHAGEAL ECHOCARDIOGRAM (TEE);  Surgeon: [name], MD;  Location[facility] ENDOSCOPY;  Service: Cardiovascular;  Laterality: N/A;  no evidence thrombus in the atrial cavity or appendage;  mild AV thickened w/ mild AI;  minimal fixed plaque in thoracic aorta;  mild MV thickened w/ mild MR;  NO pfo by color doppler or with injection of agitated saline  . TRANSTHORACIC  ECHOCARDIOGRAM  09-10-20103   grade 1 diastolic dysfunction,  ef 55-60%/  AV sclerosis without stenosis/  trivial TR  . WRIST FRACTURE SURGERY Right 2014  approx.   retained hardware    Current Outpatient Prescriptions  Medication Sig Dispense Refill  . apixaban (ELIQUIS) 5 MG TABS tablet Take 1 tablet (5 mg total) by mouth 2 (two) times daily. (Patient taking differently: Take 5 mg by mouth daily. ) 60 tablet 6  . HYDROcodone-acetaminophen (NORCO) 5-325 MG tablet Take 1-2 tablets by mouth every 6 (six) hours as needed for moderate pain or severe pain. (Patient not taking: Reported on 05/20/2016) 30 tablet 0  . lisinopril (PRINIVIL,ZESTRIL) 5 MG tablet TAKE ONE (1) TABLET EACH DAY (Patient taking differently: Take 5 mg by mouth daily. TAKE ONE (1) TABLET EACH DAY) 90 tablet 3  . tamsulosin (FLOMAX) 0.4 MG CAPS capsule Take 0.4 mg by mouth daily after breakfast.     No current facility-administered medications for this encounter.     Allergies  Allergen Reactions  . Acyclovir And Related     Social History   Social History  . Marital status: Married    Spouse name: N/A  . Number of children: 2  . Years of education: N/A   Occupational History  . retired    Social History Main Topics  . Smoking status: Former Smoker    Years: 5.00    Types: Cigarettes, Pipe    Quit date: 09/07/1966  . Smokeless tobacco: Never Used  . Alcohol use No  . Drug use: No  . Sexual activity: Yes   Other Topics Concern  . 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 Patient is retired from [employer]    Family History  Problem Relation Age of Onset  . Heart Problems Mother   . Brain cancer Father   . Parkinsonism Brother   . Dementia Brother   . Colon cancer Neg Hx   . Esophageal cancer Neg Hx   . Stomach cancer Neg Hx   . Gastric cancer Neg Hx   . Liver disease Neg Hx   . Kidney disease Neg Hx   . Diabetes Neg Hx     ROS- All systems are reviewed and  negative except as per the HPI above  Physical Exam: There were no vitals filed for this visit.  GEN- The patient is well appearing, alert and oriented x 3 today.   Head- normocephalic, atraumatic Eyes-  Sclera clear, conjunctiva pink Ears- hearing intact Oropharynx- clear Neck- supple, no JVP Lymph- no cervical lymphadenopathy Lungs- Clear to ausculation bilaterally, normal work of breathing Heart- Regular rate and rhythm, no murmurs, rubs or gallops, PMI not laterally displaced GI- soft, NT, ND, + BS Extremities- no clubbing, cyanosis, or edema MS- no significant deformity  or atrophy Skin- no rash or lesion Psych- euthymic mood, full affect Neuro- strength and sensation are intact  EKG- NSR,  With PVC's, v rate 65 bpm pr int 123 ms, qrs int 90 ms, Qtc 420 ms Epic records reviewed Linq is ERI  Assessment and Plan: 1. Paroxsymal afib No afib noted With h/o cyrptogenic stroke, chadsvasc score of 5, continue eliquis 5 mg bid No need to change dose of eliquis with recent kidney surgery, but if at age 17, creatinine is still over 1.5, will need to reduce to 2.5 mg bid  2. Rt renal carcinoma with radical nephrouretectomy Hematuria prior to surgery but non since surgery Continue eliquis  F/u in afib clinic in 6 months   Butch Penny C. Zoltan Genest, [facility] [address] [place], [place] [address] [phone]

## 2016-07-23 ENCOUNTER — Other Ambulatory Visit: Payer: Self-pay | Admitting: Internal Medicine

## 2016-08-30 ENCOUNTER — Ambulatory Visit (HOSPITAL_COMMUNITY)
Admission: RE | Admit: 2016-08-30 | Discharge: 2016-08-30 | Disposition: A | Discharge status: Home / Self Care | Payer: PPO | Source: Ambulatory Visit | Attending: Urology | Admitting: Urology

## 2016-08-30 ENCOUNTER — Other Ambulatory Visit: Payer: Self-pay | Admitting: Urology

## 2016-08-30 DIAGNOSIS — C651 Malignant neoplasm of right renal pelvis: Secondary | ICD-10-CM | POA: Diagnosis not present

## 2016-08-30 DIAGNOSIS — C641 Malignant neoplasm of right kidney, except renal pelvis: Secondary | ICD-10-CM

## 2016-08-30 DIAGNOSIS — N281 Cyst of kidney, acquired: Secondary | ICD-10-CM | POA: Diagnosis not present

## 2016-08-30 DIAGNOSIS — R918 Other nonspecific abnormal finding of lung field: Secondary | ICD-10-CM | POA: Diagnosis not present

## 2016-09-03 ENCOUNTER — Other Ambulatory Visit (INDEPENDENT_AMBULATORY_CARE_PROVIDER_SITE_OTHER): Payer: PPO

## 2016-09-03 DIAGNOSIS — Z0001 Encounter for general adult medical examination with abnormal findings: Secondary | ICD-10-CM

## 2016-09-03 DIAGNOSIS — R413 Other amnesia: Secondary | ICD-10-CM

## 2016-09-03 LAB — LIPID PANEL
Cholesterol: 135 mg/dL (ref 0–200)
HDL: 38 mg/dL — ABNORMAL LOW (ref >39.00)
LDL Cholesterol: 86 mg/dL (ref 0–99)
NonHDL: 96.85
Total CHOL/HDL Ratio: 4
Triglycerides: 52 mg/dL (ref 0.0–149.0)
VLDL: 10.4 mg/dL (ref 0.0–40.0)

## 2016-09-03 LAB — CBC WITH DIFFERENTIAL/PLATELET
Basophils Absolute: 0 10*3/uL (ref 0.0–0.1)
Basophils Relative: 0.8 % (ref 0.0–3.0)
Eosinophils Absolute: 0.1 10*3/uL (ref 0.0–0.7)
Eosinophils Relative: 2 % (ref 0.0–5.0)
HCT: 41.3 % (ref 39.0–52.0)
Hemoglobin: 13.9 g/dL (ref 13.0–17.0)
Lymphocytes Relative: 17.9 % (ref 12.0–46.0)
Lymphs Abs: 0.9 10*3/uL (ref 0.7–4.0)
MCHC: 33.6 g/dL (ref 30.0–36.0)
MCV: 95.4 fl (ref 78.0–100.0)
Monocytes Absolute: 0.5 10*3/uL (ref 0.1–1.0)
Monocytes Relative: 10.3 % (ref 3.0–12.0)
Neutro Abs: 3.3 10*3/uL (ref 1.4–7.7)
Neutrophils Relative %: 69 % (ref 43.0–77.0)
Platelets: 141 10*3/uL — ABNORMAL LOW (ref 150.0–400.0)
RBC: 4.33 Mil/uL (ref 4.22–5.81)
RDW: 13.2 % (ref 11.5–15.5)
WBC: 4.8 10*3/uL (ref 4.0–10.5)

## 2016-09-03 LAB — PSA: PSA: 0.77 ng/mL (ref 0.10–4.00)

## 2016-09-03 LAB — HEPATIC FUNCTION PANEL
ALT: 12 U/L (ref 0–53)
AST: 14 U/L (ref 0–37)
Albumin: 4.4 g/dL (ref 3.5–5.2)
Alkaline Phosphatase: 62 U/L (ref 39–117)
Bilirubin, Direct: 0.1 mg/dL (ref 0.0–0.3)
Total Bilirubin: 0.4 mg/dL (ref 0.2–1.2)
Total Protein: 6.3 g/dL (ref 6.0–8.3)

## 2016-09-03 LAB — URINALYSIS, ROUTINE W REFLEX MICROSCOPIC
Bilirubin Urine: NEGATIVE
Hgb urine dipstick: NEGATIVE
Ketones, ur: NEGATIVE
Leukocytes, UA: NEGATIVE
Nitrite: NEGATIVE
RBC / HPF: NONE SEEN (ref 0–?)
Specific Gravity, Urine: 1.015 (ref 1.000–1.030)
Total Protein, Urine: NEGATIVE
Urine Glucose: NEGATIVE
Urobilinogen, UA: 0.2 (ref 0.0–1.0)
pH: 6 (ref 5.0–8.0)

## 2016-09-03 LAB — BASIC METABOLIC PANEL
BUN: 32 mg/dL — ABNORMAL HIGH (ref 6–23)
CO2: 28 mEq/L (ref 19–32)
Calcium: 9.4 mg/dL (ref 8.4–10.5)
Chloride: 107 mEq/L (ref 96–112)
Creatinine, Ser: 2.16 mg/dL — ABNORMAL HIGH (ref 0.40–1.50)
GFR: 31.54 mL/min — ABNORMAL LOW (ref >60.00)
Glucose, Bld: 92 mg/dL (ref 70–99)
Potassium: 4.6 mEq/L (ref 3.5–5.1)
Sodium: 141 mEq/L (ref 135–145)

## 2016-09-03 LAB — VITAMIN B12: Vitamin B-12: 433 pg/mL (ref 211–911)

## 2016-09-03 LAB — TSH: TSH: 1.96 u[IU]/mL (ref 0.35–4.50)

## 2016-09-05 DIAGNOSIS — C641 Malignant neoplasm of right kidney, except renal pelvis: Secondary | ICD-10-CM | POA: Diagnosis not present

## 2016-09-06 ENCOUNTER — Encounter: Payer: Self-pay | Admitting: Internal Medicine

## 2016-09-06 ENCOUNTER — Ambulatory Visit (INDEPENDENT_AMBULATORY_CARE_PROVIDER_SITE_OTHER): Payer: PPO | Admitting: Internal Medicine

## 2016-09-06 VITALS — BP 104/64 | HR 66 | Ht 73.0 in | Wt 195.0 lb

## 2016-09-06 DIAGNOSIS — Z0000 Encounter for general adult medical examination without abnormal findings: Secondary | ICD-10-CM | POA: Diagnosis not present

## 2016-09-06 DIAGNOSIS — N183 Chronic kidney disease, stage 3 unspecified: Secondary | ICD-10-CM

## 2016-09-06 DIAGNOSIS — C641 Malignant neoplasm of right kidney, except renal pelvis: Secondary | ICD-10-CM | POA: Diagnosis not present

## 2016-09-06 DIAGNOSIS — N189 Chronic kidney disease, unspecified: Secondary | ICD-10-CM | POA: Insufficient documentation

## 2016-09-06 DIAGNOSIS — C649 Malignant neoplasm of unspecified kidney, except renal pelvis: Secondary | ICD-10-CM

## 2016-09-06 HISTORY — DX: Malignant neoplasm of unspecified kidney, except renal pelvis: C64.9

## 2016-09-06 MED ORDER — APIXABAN 5 MG PO TABS: 5.0000 mg | ORAL_TABLET | Freq: Two times a day (BID) | ORAL | 3 refills | Status: DC

## 2016-09-06 MED ORDER — TAMSULOSIN HCL 0.4 MG PO CAPS
0.4000 mg | ORAL_CAPSULE | Freq: Every day | ORAL | 3 refills | Status: DC
Start: 2016-09-06 — End: 2020-03-15

## 2016-09-06 MED ORDER — LISINOPRIL 5 MG PO TABS: ORAL_TABLET | ORAL | 3 refills | Status: DC

## 2016-09-06 NOTE — Patient Instructions (Signed)
Please continue all other medications as before, and refills have been done if requested.  Please have the pharmacy call with any other refills you may need.  Please continue your efforts at being more active, low cholesterol diet, and weight control.  You are otherwise up to date with prevention measures today.  Please keep your appointments with your specialists as you may have planned - Dr McKenzie/yurology  Please return in 1 year for your yearly visit, or sooner if needed, with Lab testing done 3-5 days before

## 2016-09-06 NOTE — Assessment & Plan Note (Signed)
Now s/p right nephrectomy,  to f/u any worsening symptoms or concerns

## 2016-09-06 NOTE — Assessment & Plan Note (Signed)
To f/u urology as planned 

## 2016-09-06 NOTE — Progress Notes (Signed)
Subjective:    Patient ID: Jeffrey Hardy, male    DOB: September 29, 1935, 81 y.o.   MRN: [id number]  HPI  Here for wellness and f/u with wife who helps with hx;  Overall doing ok;  Pt denies Chest pain, worsening SOB, DOE, wheezing, orthopnea, PND, worsening LE edema, palpitations, dizziness or syncope.  Pt denies neurological change such as new headache, facial or extremity weakness.  Pt denies polydipsia, polyuria, or low sugar symptoms. Pt states overall good compliance with treatment and medications, good tolerability, and has been trying to follow appropriate diet.  Pt denies worsening depressive symptoms, suicidal ideation or panic. No fever, night sweats, wt loss, loss of appetite, or other constitutional symptoms.  Pt states good ability with ADL's, has low fall risk, home safety reviewed and adequate, no other significant changes in hearing or vision, and only occasionally active with exercise. Had f/u with urology Dr Alyson Ingles for f/u right renal cell ca 3 mo checkup and no signs of recurrence by ct.  Past Medical History:  Diagnosis Date  . Anxiety   . Bilateral renal cysts   . Cancer ([place])    renal mass  . Depression   . Diverticulosis of colon   . Erectile dysfunction   . Hematuria   . History of cerebral embolic infarction x2 CVA's ---  neurologist-  [name]--- per pt residual abnormal gait , does need cane   12-16-2011  right frontal embolic infarct and [id number] left frontal embolic infarct (per first MRI previous hemorrgaic cerebral ischemia)  . History of duodenal ulcer    1997  . History of Helicobacter pylori infection    1997  . History of hiatal hernia   . History of loop recorder    Current  . History of syncope    1997  and 2014  s/p  loop recorder  . Hyperlipidemia   . Hypertension   . Nocturia   . PAF (paroxysmal atrial fibrillation) [facility])    cardiologist-  dr [name]  . Renal cell cancer ([place]) 09/06/2016  . Right renal mass   . Status post placement of implantable  loop recorder    07-06-2012  . Stroke [facility])    2013 2 mild strokes mild weakness bil sides  . Urgency of urination   . Wears hearing aid    right ear only   Past Surgical History:  Procedure Laterality Date  . APPENDECTOMY  age 5  . COLONOSCOPY  last one 07-17-2015  . CYSTOSCOPY/RETROGRADE/URETEROSCOPY Right 04/22/2016   Procedure: CYSTOSCOPY/RETROGRADE/URETEROSCOPY/ BIOPSY/ STENT PLACEMENT;  Surgeon: [name], MD;  Location: [facility];  Service: Urology;  Laterality: Right;  . ESOPHAGOGASTRODUODENOSCOPY  last one 09-16-2014  . LOOP RECORDER IMPLANT N/A 07/06/2012   Procedure: LOOP RECORDER IMPLANT;  Surgeon: [name], MD;  Location: [facility] CATH LAB;  Service: Cardiovascular;  Laterality: N/A;  . ROBOT ASSITED LAPAROSCOPIC NEPHROURETERECTOMY Right 05/13/2016   Procedure: XI ROBOT ASSITED LAPAROSCOPIC NEPHROURETERECTOMY;  Surgeon: [name], MD;  [facility];  Service: Urology;  Laterality: Right;  . TEE WITHOUT CARDIOVERSION  12/19/2011   Procedure: TRANSESOPHAGEAL ECHOCARDIOGRAM (TEE);  Surgeon: [name], MD;  Location: [facility] ENDOSCOPY;  Service: Cardiovascular;  Laterality: N/A;  no evidence thrombus in the atrial cavity or appendage;  mild AV thickened w/ mild AI;  minimal fixed plaque in thoracic aorta;  mild MV thickened w/ mild MR;  NO pfo by color doppler or with injection of agitated saline  . TRANSTHORACIC  ECHOCARDIOGRAM  09-10-20103   grade 1 diastolic dysfunction,  ef 55-60%/  AV sclerosis without stenosis/  trivial TR  . WRIST FRACTURE SURGERY Right 2014  approx.   retained hardware    reports that he quit smoking about 50 years ago. His smoking use included Cigarettes and Pipe. He quit after 5.00 years of use. He has never used smokeless tobacco. He reports that he does not drink alcohol or use drugs. family history includes Brain cancer in his father; Dementia in his brother; Heart Problems in his mother; Parkinsonism in his  brother. Allergies  Allergen Reactions  . Acyclovir And Related    No current outpatient prescriptions on file prior to visit.   No current facility-administered medications on file prior to visit.     Review of Systems Constitutional: Negative for other unusual diaphoresis, sweats, appetite or weight changes HENT: Negative for other worsening hearing loss, ear pain, facial swelling, mouth sores or neck stiffness.   Eyes: Negative for other worsening pain, redness or other visual disturbance.  Respiratory: Negative for other stridor or swelling Cardiovascular: Negative for other palpitations or other chest pain  Gastrointestinal: Negative for worsening diarrhea or loose stools, blood in stool, distention or other pain Genitourinary: Negative for hematuria, flank pain or other change in urine volume.  Musculoskeletal: Negative for myalgias or other joint swelling.  Skin: Negative for other color change, or other wound or worsening drainage.  Neurological: Negative for other syncope or numbness. Hematological: Negative for other adenopathy or swelling Psychiatric/Behavioral: Negative for hallucinations, other worsening agitation, SI, self-injury, or new decreased concentration All other system neg per pt    Objective:   Physical Exam BP 104/64   Pulse 66   Ht 6\' 1"  (1.854 m)   Wt 195 lb (88.5 kg)   SpO2 99%   BMI 25.73 kg/m  VS noted, not ill appearing Constitutional: Pt is oriented to person, place, and time. Appears well-developed and well-nourished, in no significant distress and comfortable Head: Normocephalic and atraumatic  Eyes: Conjunctivae and EOM are normal. Pupils are equal, round, and reactive to light Right Ear: External ear normal without discharge Left Ear: External ear normal without discharge Nose: Nose without discharge or deformity Mouth/Throat: Oropharynx is without other ulcerations and moist  Neck: Normal range of motion. Neck supple. No JVD present. No  tracheal deviation present or significant neck LA or mass Cardiovascular: Normal rate, regular rhythm, normal heart sounds and intact distal pulses.   Pulmonary/Chest: WOB normal and breath sounds without rales or wheezing  Abdominal: Soft. Bowel sounds are normal. NT. No HSM  Musculoskeletal: Normal range of motion. Exhibits no edema Lymphadenopathy: Has no other cervical adenopathy.  Neurological: Pt is alert and oriented to person, place only. Pt has normal reflexes. No cranial nerve deficit. Motor grossly intact, Gait intact, pleasantly demented Skin: Skin is warm and dry. No rash noted or new ulcerations Psychiatric:  Has normal mood and affect. Behavior is normal without agitation No other exam findings    Assessment & Plan:

## 2016-09-06 NOTE — Assessment & Plan Note (Signed)

## 2016-09-11 DIAGNOSIS — L602 Onychogryphosis: Secondary | ICD-10-CM | POA: Diagnosis not present

## 2016-09-11 DIAGNOSIS — L84 Corns and callosities: Secondary | ICD-10-CM | POA: Diagnosis not present

## 2016-09-11 DIAGNOSIS — M79671 Pain in right foot: Secondary | ICD-10-CM | POA: Diagnosis not present

## 2016-09-11 DIAGNOSIS — M79672 Pain in left foot: Secondary | ICD-10-CM | POA: Diagnosis not present

## 2016-10-18 NOTE — Addendum Note (Signed)
Addendum  created 10/18/16 0902 by Nolon Nations, MD   Sign clinical note

## 2016-10-18 NOTE — Anesthesia Postprocedure Evaluation (Signed)
Anesthesia Post Note  Patient: Jeffrey Hardy  Procedure(s) Performed: Procedure(s) (LRB): CYSTOSCOPY/RETROGRADE/URETEROSCOPY/ BIOPSY/ STENT PLACEMENT (Right)     Anesthesia Post Evaluation  Last Vitals:  Vitals:   04/22/16 1445 04/22/16 1530  BP: 111/65 133/61  Pulse: 65 (!) 57  Resp: 18 16  Temp:  36.4 C    Last Pain:  Vitals:   04/22/16 1545  TempSrc:   PainSc: [place]

## 2016-10-18 NOTE — Anesthesia Postprocedure Evaluation (Signed)
Anesthesia Post Note  Patient: Jeffrey Hardy  Procedure(s) Performed: Procedure(s) (LRB): CYSTOSCOPY/RETROGRADE/URETEROSCOPY/ BIOPSY/ STENT PLACEMENT (Right)     Anesthesia Post Evaluation  Last Vitals:  Vitals:   04/22/16 1445 04/22/16 1530  BP: 111/65 133/61  Pulse: 65 (!) 57  Resp: 18 16  Temp:  36.4 C    Last Pain:  Vitals:   04/22/16 1545  TempSrc:   PainSc: [place]

## 2016-11-04 DIAGNOSIS — R079 Chest pain, unspecified: Secondary | ICD-10-CM | POA: Diagnosis not present

## 2017-01-20 ENCOUNTER — Ambulatory Visit (HOSPITAL_COMMUNITY)
Admission: RE | Admit: 2017-01-20 | Discharge: 2017-01-20 | Disposition: A | Discharge status: Home / Self Care | Payer: PPO | Source: Ambulatory Visit | Attending: Nurse Practitioner | Admitting: Nurse Practitioner

## 2017-01-20 ENCOUNTER — Encounter (HOSPITAL_COMMUNITY): Payer: Self-pay | Admitting: Nurse Practitioner

## 2017-01-20 VITALS — BP 130/70 | HR 66 | Ht 73.0 in | Wt 193.6 lb

## 2017-01-20 DIAGNOSIS — Z8249 Family history of ischemic heart disease and other diseases of the circulatory system: Secondary | ICD-10-CM | POA: Diagnosis not present

## 2017-01-20 DIAGNOSIS — Z905 Acquired absence of kidney: Secondary | ICD-10-CM | POA: Insufficient documentation

## 2017-01-20 DIAGNOSIS — I10 Essential (primary) hypertension: Secondary | ICD-10-CM | POA: Insufficient documentation

## 2017-01-20 DIAGNOSIS — Z8673 Personal history of transient ischemic attack (TIA), and cerebral infarction without residual deficits: Secondary | ICD-10-CM | POA: Insufficient documentation

## 2017-01-20 DIAGNOSIS — Z79899 Other long term (current) drug therapy: Secondary | ICD-10-CM | POA: Insufficient documentation

## 2017-01-20 DIAGNOSIS — Z85528 Personal history of other malignant neoplasm of kidney: Secondary | ICD-10-CM | POA: Diagnosis not present

## 2017-01-20 DIAGNOSIS — Z87891 Personal history of nicotine dependence: Secondary | ICD-10-CM | POA: Diagnosis not present

## 2017-01-20 DIAGNOSIS — Z8711 Personal history of peptic ulcer disease: Secondary | ICD-10-CM | POA: Insufficient documentation

## 2017-01-20 DIAGNOSIS — E785 Hyperlipidemia, unspecified: Secondary | ICD-10-CM | POA: Insufficient documentation

## 2017-01-20 DIAGNOSIS — Z7901 Long term (current) use of anticoagulants: Secondary | ICD-10-CM | POA: Diagnosis not present

## 2017-01-20 DIAGNOSIS — I480 Paroxysmal atrial fibrillation: Secondary | ICD-10-CM

## 2017-01-20 DIAGNOSIS — Z808 Family history of malignant neoplasm of other organs or systems: Secondary | ICD-10-CM | POA: Insufficient documentation

## 2017-01-20 NOTE — Progress Notes (Signed)
Patient ID: Jeffrey Hardy, male   DOB: 14-Nov-1935, 81 y.o.   MRN: [id number]     Primary Care Physician: Biagio Borg, MD Referring Physician: Dr. Jolyn Nap   Jeffrey Hardy is a 81 y.o. male with a h/o cryptogenic stroke  12/20/11. He had a Linq monitor implanted in 2014. He had been on aggrenox since his stroke in 2013. When his linq showed afib 07/2015,  he was sent  to afib clinic by Dr. [name] to discuss anticoagulation. He was not aware of irregular heart beat. Chadsvasc score is 5.He was started on eliquis and aggrenox was stopped.  He returned to the afib clinic 08/29/15. No issues with eliquis, no bleeding issues. Not aware of any irregular heart beat.   He was seen by Dr. [name] 11/09/15 and Linq was ERI and he elected to leave in place. No c/o of irregular heart beat.  F/U afib clinic 06/10/16. He continues on apixaban. No bleeding issues. Last Creatinine  was 1.90 05/16/16 , but is still on appropriate dose with age being less than 22 and weight at 187. But at age 22, he will have to reduce dose to 2.5 mg bid if renal function stays over 1.5. He had nephrectomy 2 weeks ago for renal mass which was cancerous but will not have to have any chem or radiation. He reports no afib as far as he know since that time. His nephrologist will be checking labs every 3 months for the next year. No further hematuria since surgery.  F/u 01/20/17, he felt funny a few weeks ago and his wife took him to fire station thinking it might be afib, but he was in [place]. The symptoms were short lived. Recent Creatinine is around 2.0. Still appropriately dosed at eliquis 5 mg bid. He has close f/u with his nephrologist with h/o renal ca and lone kidney. SR on EKG today.  Today, he denies symptoms of palpitations, chest pain, shortness of breath, orthopnea, PND, lower extremity edema, dizziness, presyncope, syncope, or neurologic sequela. The patient is tolerating medications without difficulties and is otherwise without  complaint today.   Past Medical History:  Diagnosis Date  . Anxiety   . Bilateral renal cysts   . Cancer ([place])    renal mass  . Depression   . Diverticulosis of colon   . Erectile dysfunction   . Hematuria   . History of cerebral embolic infarction x2 CVA's ---  neurologist-  [name]--- per pt residual abnormal gait , does need cane   12-16-2011  right frontal embolic infarct and [id number] left frontal embolic infarct (per first MRI previous hemorrgaic cerebral ischemia)  . History of duodenal ulcer    1997  . History of Helicobacter pylori infection    1997  . History of hiatal hernia   . History of loop recorder    Current  . History of syncope    1997  and 2014  s/p  loop recorder  . Hyperlipidemia   . Hypertension   . Nocturia   . PAF (paroxysmal atrial fibrillation) [facility])    cardiologist-  dr [name]  . Renal cell cancer ([place]) 09/06/2016  . Right renal mass   . Status post placement of implantable loop recorder    07-06-2012  . Stroke [facility])    2013 2 mild strokes mild weakness bil sides  . Urgency of urination   . Wears hearing aid    right ear only   Past Surgical History:  Procedure Laterality Date  . APPENDECTOMY  age 40  . COLONOSCOPY  last one 07-17-2015  . CYSTOSCOPY/RETROGRADE/URETEROSCOPY Right 04/22/2016   Procedure: CYSTOSCOPY/RETROGRADE/URETEROSCOPY/ BIOPSY/ STENT PLACEMENT;  Surgeon: [name], MD;  Location[facility];  Service: Urology;  Laterality: Right;  . ESOPHAGOGASTRODUODENOSCOPY  last one 09-16-2014  . LOOP RECORDER IMPLANT N/A 07/06/2012   Procedure: LOOP RECORDER IMPLANT;  Surgeon: [name], MD;  Location: [facility] CATH LAB;  Service: Cardiovascular;  Laterality: N/A;  . ROBOT ASSITED LAPAROSCOPIC NEPHROURETERECTOMY Right 05/13/2016   Procedure: XI ROBOT ASSITED LAPAROSCOPIC NEPHROURETERECTOMY;  Surgeon: [name], MD;  [facility];  Service: Urology;  Laterality: Right;  . TEE WITHOUT CARDIOVERSION   12/19/2011   Procedure: TRANSESOPHAGEAL ECHOCARDIOGRAM (TEE);  Surgeon: [name], MD;  Location: [facility] ENDOSCOPY;  Service: Cardiovascular;  Laterality: N/A;  no evidence thrombus in the atrial cavity or appendage;  mild AV thickened w/ mild AI;  minimal fixed plaque in thoracic aorta;  mild MV thickened w/ mild MR;  NO pfo by color doppler or with injection of agitated saline  . TRANSTHORACIC ECHOCARDIOGRAM  09-10-20103   grade 1 diastolic dysfunction,  ef 55-60%/  AV sclerosis without stenosis/  trivial TR  . WRIST FRACTURE SURGERY Right 2014  approx.   retained hardware    Current Outpatient Prescriptions  Medication Sig Dispense Refill  . apixaban (ELIQUIS) 5 MG TABS tablet Take 1 tablet (5 mg total) by mouth 2 (two) times daily. 180 tablet 3  . lisinopril (PRINIVIL,ZESTRIL) 5 MG tablet TAKE ONE (1) TABLET EACH DAY 90 tablet 3  . tamsulosin (FLOMAX) 0.4 MG CAPS capsule Take 1 capsule (0.4 mg total) by mouth daily after breakfast. 90 capsule 3   No current facility-administered medications for this encounter.     Allergies  Allergen Reactions  . Acyclovir And Related     Social History   Social History  . Marital status: Married    Spouse name: N/A  . Number of children: 2  . Years of education: N/A   Occupational History  . retired    Social History Main Topics  . Smoking status: Former Smoker    Years: 5.00    Types: Cigarettes, Pipe    Quit date: 09/07/1966  . Smokeless tobacco: Never Used  . Alcohol use No  . Drug use: No  . Sexual activity: Yes   Other Topics Concern  . 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 Patient is retired from [employer]    Family History  Problem Relation Age of Onset  . Heart Problems Mother   . Brain cancer Father   . Parkinsonism Brother   . Dementia Brother   . Colon cancer Neg Hx   . Esophageal cancer Neg Hx   . Stomach cancer Neg Hx   . Gastric cancer Neg Hx   . Liver disease Neg Hx   .  Kidney disease Neg Hx   . Diabetes Neg Hx     ROS- All systems are reviewed and negative except as per the HPI above  Physical Exam: Vitals:   01/20/17 0855  BP: 130/70  Pulse: 66  Weight: 193 lb 9.6 oz (87.8 kg)  Height: 6\' 1"  (1.854 m)    GEN- The patient is well appearing, alert and oriented x 3 today.   Head- normocephalic, atraumatic Eyes-  Sclera clear, conjunctiva pink Ears- hearing intact Oropharynx- clear Neck- supple, no JVP Lymph- no  cervical lymphadenopathy Lungs- Clear to ausculation bilaterally, normal work of breathing Heart- Regular rate and rhythm, no murmurs, rubs or gallops, PMI not laterally displaced GI- soft, NT, ND, + BS Extremities- no clubbing, cyanosis, or edema MS- no significant deformity or atrophy Skin- no rash or lesion Psych- euthymic mood, full affect Neuro- strength and sensation are intact  EKG- NSR, rate 66 bpm pr int 150 ms, qrs int 92 ms, Qtc 425 ms Epic records reviewed Linq is ERI  Assessment and Plan: 1. Paroxsymal afib No afib noted With h/o cyrptogenic stroke, chadsvasc score of 5, continue eliquis 5 mg bid No need to change dose of eliquis with recent creatinine of 2.0, but   at age 74, creatinine is still over 1.5, will need to reduce to 2.5 mg bid  2. Rt renal carcinoma with radical nephrouretectomy Hematuria prior to surgery but non since surgery Continue eliquis  F/u in afib clinic in 6 months   Butch Penny C. Paiten Boies, [facility] [address] [place], [place] [address] [phone]

## 2017-01-23 DIAGNOSIS — M170 Bilateral primary osteoarthritis of knee: Secondary | ICD-10-CM | POA: Diagnosis not present

## 2017-03-07 ENCOUNTER — Ambulatory Visit (HOSPITAL_COMMUNITY)
Admission: RE | Admit: 2017-03-07 | Discharge: 2017-03-07 | Disposition: A | Discharge status: Home / Self Care | Payer: PPO | Source: Ambulatory Visit | Attending: Urology | Admitting: Urology

## 2017-03-07 ENCOUNTER — Other Ambulatory Visit: Payer: Self-pay | Admitting: Urology

## 2017-03-07 DIAGNOSIS — R079 Chest pain, unspecified: Secondary | ICD-10-CM | POA: Diagnosis not present

## 2017-03-07 DIAGNOSIS — I7090 Unspecified atherosclerosis: Secondary | ICD-10-CM | POA: Insufficient documentation

## 2017-03-07 DIAGNOSIS — C641 Malignant neoplasm of right kidney, except renal pelvis: Secondary | ICD-10-CM | POA: Diagnosis not present

## 2017-03-07 DIAGNOSIS — C649 Malignant neoplasm of unspecified kidney, except renal pelvis: Secondary | ICD-10-CM | POA: Diagnosis not present

## 2017-03-12 DIAGNOSIS — M79671 Pain in right foot: Secondary | ICD-10-CM | POA: Diagnosis not present

## 2017-03-12 DIAGNOSIS — M79672 Pain in left foot: Secondary | ICD-10-CM | POA: Diagnosis not present

## 2017-03-12 DIAGNOSIS — L602 Onychogryphosis: Secondary | ICD-10-CM | POA: Diagnosis not present

## 2017-03-12 DIAGNOSIS — L84 Corns and callosities: Secondary | ICD-10-CM | POA: Diagnosis not present

## 2017-04-03 DIAGNOSIS — C641 Malignant neoplasm of right kidney, except renal pelvis: Secondary | ICD-10-CM | POA: Diagnosis not present

## 2017-04-03 DIAGNOSIS — R351 Nocturia: Secondary | ICD-10-CM | POA: Diagnosis not present

## 2017-04-03 DIAGNOSIS — N401 Enlarged prostate with lower urinary tract symptoms: Secondary | ICD-10-CM | POA: Diagnosis not present

## 2017-05-07 DIAGNOSIS — H524 Presbyopia: Secondary | ICD-10-CM | POA: Diagnosis not present

## 2017-06-11 DIAGNOSIS — M79671 Pain in right foot: Secondary | ICD-10-CM | POA: Diagnosis not present

## 2017-06-11 DIAGNOSIS — L84 Corns and callosities: Secondary | ICD-10-CM | POA: Diagnosis not present

## 2017-06-11 DIAGNOSIS — M79672 Pain in left foot: Secondary | ICD-10-CM | POA: Diagnosis not present

## 2017-06-11 DIAGNOSIS — L602 Onychogryphosis: Secondary | ICD-10-CM | POA: Diagnosis not present

## 2017-07-22 ENCOUNTER — Ambulatory Visit (HOSPITAL_COMMUNITY)
Admission: RE | Admit: 2017-07-22 | Discharge: 2017-07-22 | Disposition: A | Discharge status: Home / Self Care | Payer: PPO | Source: Ambulatory Visit | Attending: Nurse Practitioner | Admitting: Nurse Practitioner

## 2017-07-22 ENCOUNTER — Encounter (HOSPITAL_COMMUNITY): Payer: Self-pay | Admitting: Nurse Practitioner

## 2017-07-22 VITALS — BP 120/72 | HR 62 | Ht 73.0 in | Wt 197.0 lb

## 2017-07-22 DIAGNOSIS — R319 Hematuria, unspecified: Secondary | ICD-10-CM | POA: Diagnosis not present

## 2017-07-22 DIAGNOSIS — I480 Paroxysmal atrial fibrillation: Secondary | ICD-10-CM | POA: Diagnosis not present

## 2017-07-22 DIAGNOSIS — Z79899 Other long term (current) drug therapy: Secondary | ICD-10-CM | POA: Diagnosis not present

## 2017-07-22 DIAGNOSIS — F329 Major depressive disorder, single episode, unspecified: Secondary | ICD-10-CM | POA: Insufficient documentation

## 2017-07-22 DIAGNOSIS — Z7901 Long term (current) use of anticoagulants: Secondary | ICD-10-CM | POA: Insufficient documentation

## 2017-07-22 DIAGNOSIS — Z9889 Other specified postprocedural states: Secondary | ICD-10-CM | POA: Insufficient documentation

## 2017-07-22 DIAGNOSIS — Z8673 Personal history of transient ischemic attack (TIA), and cerebral infarction without residual deficits: Secondary | ICD-10-CM | POA: Diagnosis not present

## 2017-07-22 DIAGNOSIS — E785 Hyperlipidemia, unspecified: Secondary | ICD-10-CM | POA: Insufficient documentation

## 2017-07-22 DIAGNOSIS — K449 Diaphragmatic hernia without obstruction or gangrene: Secondary | ICD-10-CM | POA: Diagnosis not present

## 2017-07-22 DIAGNOSIS — F419 Anxiety disorder, unspecified: Secondary | ICD-10-CM | POA: Diagnosis not present

## 2017-07-22 DIAGNOSIS — Z87891 Personal history of nicotine dependence: Secondary | ICD-10-CM | POA: Insufficient documentation

## 2017-07-22 DIAGNOSIS — C641 Malignant neoplasm of right kidney, except renal pelvis: Secondary | ICD-10-CM | POA: Diagnosis not present

## 2017-07-22 LAB — BASIC METABOLIC PANEL
Anion gap: 8 (ref 5–15)
BUN: 28 mg/dL — ABNORMAL HIGH (ref 6–20)
CO2: 27 mmol/L (ref 22–32)
Calcium: 9.3 mg/dL (ref 8.9–10.3)
Chloride: 106 mmol/L (ref 101–111)
Creatinine, Ser: 2.17 mg/dL — ABNORMAL HIGH (ref 0.61–1.24)
GFR calc Af Amer: 32 mL/min — ABNORMAL LOW (ref >60)
GFR calc non Af Amer: 27 mL/min — ABNORMAL LOW (ref >60)
Glucose, Bld: 103 mg/dL — ABNORMAL HIGH (ref 65–99)
Potassium: 4.6 mmol/L (ref 3.5–5.1)
Sodium: 141 mmol/L (ref 135–145)

## 2017-07-22 LAB — CBC
HCT: 43.9 % (ref 39.0–52.0)
Hemoglobin: 14.5 g/dL (ref 13.0–17.0)
MCH: 32.7 pg (ref 26.0–34.0)
MCHC: 33 g/dL (ref 30.0–36.0)
MCV: 98.9 fL (ref 78.0–100.0)
Platelets: 147 10*3/uL — ABNORMAL LOW (ref 150–400)
RBC: 4.44 MIL/uL (ref 4.22–5.81)
RDW: 13.4 % (ref 11.5–15.5)
WBC: 5 10*3/uL (ref 4.0–10.5)
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
## 2017-09-04 ENCOUNTER — Encounter (HOSPITAL_BASED_OUTPATIENT_CLINIC_OR_DEPARTMENT_OTHER): Payer: Self-pay | Admitting: Adult Health

## 2017-09-04 ENCOUNTER — Inpatient Hospital Stay (HOSPITAL_BASED_OUTPATIENT_CLINIC_OR_DEPARTMENT_OTHER)
Admission: EM | Admit: 2017-09-04 | Discharge: 2017-09-10 | DRG: 470 | Disposition: A | Discharge status: Home Health | Payer: PPO | Attending: Family Medicine | Admitting: Family Medicine

## 2017-09-04 ENCOUNTER — Other Ambulatory Visit: Payer: Self-pay

## 2017-09-04 ENCOUNTER — Emergency Department (HOSPITAL_BASED_OUTPATIENT_CLINIC_OR_DEPARTMENT_OTHER): Payer: PPO

## 2017-09-04 DIAGNOSIS — I672 Cerebral atherosclerosis: Secondary | ICD-10-CM | POA: Diagnosis not present

## 2017-09-04 DIAGNOSIS — Z87891 Personal history of nicotine dependence: Secondary | ICD-10-CM

## 2017-09-04 DIAGNOSIS — I6789 Other cerebrovascular disease: Secondary | ICD-10-CM | POA: Diagnosis not present

## 2017-09-04 DIAGNOSIS — M25552 Pain in left hip: Secondary | ICD-10-CM | POA: Diagnosis present

## 2017-09-04 DIAGNOSIS — Z471 Aftercare following joint replacement surgery: Secondary | ICD-10-CM | POA: Diagnosis not present

## 2017-09-04 DIAGNOSIS — Z905 Acquired absence of kidney: Secondary | ICD-10-CM

## 2017-09-04 DIAGNOSIS — I480 Paroxysmal atrial fibrillation: Secondary | ICD-10-CM | POA: Diagnosis not present

## 2017-09-04 DIAGNOSIS — Z85528 Personal history of other malignant neoplasm of kidney: Secondary | ICD-10-CM | POA: Diagnosis not present

## 2017-09-04 DIAGNOSIS — I129 Hypertensive chronic kidney disease with stage 1 through stage 4 chronic kidney disease, or unspecified chronic kidney disease: Secondary | ICD-10-CM | POA: Diagnosis not present

## 2017-09-04 DIAGNOSIS — S72012A Unspecified intracapsular fracture of left femur, initial encounter for closed fracture: Secondary | ICD-10-CM | POA: Diagnosis not present

## 2017-09-04 DIAGNOSIS — Z7901 Long term (current) use of anticoagulants: Secondary | ICD-10-CM

## 2017-09-04 DIAGNOSIS — W1830XA Fall on same level, unspecified, initial encounter: Secondary | ICD-10-CM | POA: Diagnosis present

## 2017-09-04 DIAGNOSIS — K449 Diaphragmatic hernia without obstruction or gangrene: Secondary | ICD-10-CM | POA: Diagnosis not present

## 2017-09-04 DIAGNOSIS — S72002A Fracture of unspecified part of neck of left femur, initial encounter for closed fracture: Secondary | ICD-10-CM | POA: Diagnosis present

## 2017-09-04 DIAGNOSIS — F419 Anxiety disorder, unspecified: Secondary | ICD-10-CM | POA: Diagnosis not present

## 2017-09-04 DIAGNOSIS — Z888 Allergy status to other drugs, medicaments and biological substances status: Secondary | ICD-10-CM | POA: Diagnosis not present

## 2017-09-04 DIAGNOSIS — F329 Major depressive disorder, single episode, unspecified: Secondary | ICD-10-CM | POA: Diagnosis present

## 2017-09-04 DIAGNOSIS — Y9389 Activity, other specified: Secondary | ICD-10-CM | POA: Diagnosis not present

## 2017-09-04 DIAGNOSIS — Z8673 Personal history of transient ischemic attack (TIA), and cerebral infarction without residual deficits: Secondary | ICD-10-CM | POA: Diagnosis not present

## 2017-09-04 DIAGNOSIS — Z96642 Presence of left artificial hip joint: Secondary | ICD-10-CM

## 2017-09-04 DIAGNOSIS — D696 Thrombocytopenia, unspecified: Secondary | ICD-10-CM | POA: Diagnosis not present

## 2017-09-04 DIAGNOSIS — Z01818 Encounter for other preprocedural examination: Secondary | ICD-10-CM | POA: Diagnosis not present

## 2017-09-04 DIAGNOSIS — N183 Chronic kidney disease, stage 3 unspecified: Secondary | ICD-10-CM | POA: Diagnosis present

## 2017-09-04 DIAGNOSIS — S72009A Fracture of unspecified part of neck of unspecified femur, initial encounter for closed fracture: Secondary | ICD-10-CM | POA: Diagnosis not present

## 2017-09-04 DIAGNOSIS — I10 Essential (primary) hypertension: Secondary | ICD-10-CM | POA: Diagnosis present

## 2017-09-04 DIAGNOSIS — E785 Hyperlipidemia, unspecified: Secondary | ICD-10-CM | POA: Diagnosis present

## 2017-09-04 DIAGNOSIS — Y9271 Barn as the place of occurrence of the external cause: Secondary | ICD-10-CM

## 2017-09-04 DIAGNOSIS — K219 Gastro-esophageal reflux disease without esophagitis: Secondary | ICD-10-CM | POA: Diagnosis not present

## 2017-09-04 DIAGNOSIS — Z09 Encounter for follow-up examination after completed treatment for conditions other than malignant neoplasm: Secondary | ICD-10-CM

## 2017-09-04 DIAGNOSIS — C649 Malignant neoplasm of unspecified kidney, except renal pelvis: Secondary | ICD-10-CM | POA: Diagnosis present

## 2017-09-04 LAB — CBC WITH DIFFERENTIAL/PLATELET
Basophils Absolute: 0 10*3/uL (ref 0.0–0.1)
Basophils Relative: 0 %
Eosinophils Absolute: 0 10*3/uL (ref 0.0–0.7)
Eosinophils Relative: 0 %
HCT: 42.6 % (ref 39.0–52.0)
Hemoglobin: 15 g/dL (ref 13.0–17.0)
Lymphocytes Relative: 4 %
Lymphs Abs: 0.4 10*3/uL — ABNORMAL LOW (ref 0.7–4.0)
MCH: 33.7 pg (ref 26.0–34.0)
MCHC: 35.2 g/dL (ref 30.0–36.0)
MCV: 95.7 fL (ref 78.0–100.0)
Monocytes Absolute: 0.4 10*3/uL (ref 0.1–1.0)
Monocytes Relative: 4 %
Neutro Abs: 9.5 10*3/uL — ABNORMAL HIGH (ref 1.7–7.7)
Neutrophils Relative %: 92 %
Platelets: 132 10*3/uL — ABNORMAL LOW (ref 150–400)
RBC: 4.45 MIL/uL (ref 4.22–5.81)
RDW: 12.5 % (ref 11.5–15.5)
WBC: 10.4 10*3/uL (ref 4.0–10.5)

## 2017-09-04 LAB — BASIC METABOLIC PANEL
Anion gap: 11 (ref 5–15)
BUN: 29 mg/dL — ABNORMAL HIGH (ref 6–20)
CO2: 22 mmol/L (ref 22–32)
Calcium: 9.2 mg/dL (ref 8.9–10.3)
Chloride: 105 mmol/L (ref 101–111)
Creatinine, Ser: 2.05 mg/dL — ABNORMAL HIGH (ref 0.61–1.24)
GFR calc Af Amer: 34 mL/min — ABNORMAL LOW (ref >60)
GFR calc non Af Amer: 29 mL/min — ABNORMAL LOW (ref >60)
Glucose, Bld: 114 mg/dL — ABNORMAL HIGH (ref 65–99)
Potassium: 4.1 mmol/L (ref 3.5–5.1)
Sodium: 138 mmol/L (ref 135–145)

## 2017-09-04 LAB — PROTIME-INR
INR: 1.12
Prothrombin Time: 14.3 seconds (ref 11.4–15.2)

## 2017-09-04 MED ORDER — MORPHINE SULFATE (PF) 2 MG/ML IV SOLN
INTRAVENOUS | Status: AC
  Filled 2017-09-04: qty 1

## 2017-09-04 MED ORDER — FENTANYL CITRATE (PF) 100 MCG/2ML IJ SOLN
INTRAMUSCULAR | Status: AC
  Filled 2017-09-04: qty 2

## 2017-09-04 MED ORDER — MORPHINE SULFATE (PF) 2 MG/ML IV SOLN
1.0000 mg | INTRAVENOUS | Status: DC | PRN
  Administered 2017-09-04 – 2017-09-05 (×5): 1 mg via INTRAVENOUS
  Filled 2017-09-04 (×4): qty 1

## 2017-09-04 MED ORDER — ONDANSETRON HCL 4 MG/2ML IJ SOLN
4.0000 mg | Freq: Once | INTRAMUSCULAR | Status: AC
  Administered 2017-09-04: 4 mg via INTRAVENOUS
  Filled 2017-09-04: qty 2

## 2017-09-04 MED ORDER — TAMSULOSIN HCL 0.4 MG PO CAPS
0.4000 mg | ORAL_CAPSULE | Freq: Every day | ORAL | Status: DC
  Administered 2017-09-05 – 2017-09-10 (×5): 0.4 mg via ORAL
  Filled 2017-09-04 (×5): qty 1

## 2017-09-04 MED ORDER — HYDRALAZINE HCL 20 MG/ML IJ SOLN: 5.0000 mg | INTRAMUSCULAR | Status: DC | PRN

## 2017-09-04 MED ORDER — FENTANYL CITRATE (PF) 100 MCG/2ML IJ SOLN
50.0000 ug | Freq: Once | INTRAMUSCULAR | Status: AC
  Administered 2017-09-04: 50 ug via INTRAVENOUS

## 2017-09-04 MED ORDER — SENNA 8.6 MG PO TABS
1.0000 | ORAL_TABLET | Freq: Every day | ORAL | Status: DC
  Administered 2017-09-05 – 2017-09-08 (×3): 8.6 mg via ORAL
  Filled 2017-09-04 (×3): qty 1

## 2017-09-04 MED ORDER — FENTANYL CITRATE (PF) 100 MCG/2ML IJ SOLN
50.0000 ug | INTRAMUSCULAR | Status: AC | PRN
  Administered 2017-09-04 (×2): 50 ug via INTRAVENOUS
  Filled 2017-09-04 (×2): qty 2

## 2017-09-04 NOTE — H&P (Signed)
History and Physical    Jeffrey Hardy Jeffrey Hardy:[id number] DOB: 06/02/1935 DOA: 09/04/2017  PCP: Biagio Borg, MD  Patient coming from: Home.  Chief Complaint: Fall.  HPI: Jeffrey Hardy is a 82 y.o. male with history of atrial fibrillation on Eliquis, hypertension, chronic kidney disease stage III, history of renal cell carcinoma status post right-sided nephrectomy had a fall at home while walking in his barn.  Patient fell onto his hip and since it was hurting came to the ER.  Denies hitting his head or losing consciousness denies any palpitation chest pain or shortness of breath.  Had taken his apixaban this morning.  ED Course: In the ER x-rays revealed left hip fracture.  On-call orthopedic surgeon was consulted.  Since patient has had apixaban today surgery is planned to be done on Sunday 2 days from now.  Review of Systems: As per HPI, rest all negative.   Past Medical History:  Diagnosis Date  . Anxiety   . Bilateral renal cysts   . Cancer ([place])    renal mass  . Depression   . Diverticulosis of colon   . Erectile dysfunction   . Hematuria   . History of cerebral embolic infarction x2 CVA's ---  neurologist-  [name]--- per pt residual abnormal gait , does need cane   12-16-2011  right frontal embolic infarct and 30-16-0109 left frontal embolic infarct (per first MRI previous hemorrgaic cerebral ischemia)  . History of duodenal ulcer    1997  . History of Helicobacter pylori infection    1997  . History of hiatal hernia   . History of loop recorder    Current  . History of syncope    1997  and 2014  s/p  loop recorder  . Hyperlipidemia   . Hypertension   . Nocturia   . PAF (paroxysmal atrial fibrillation) [facility])    cardiologist-  dr [name]  . Renal cell cancer ([place]) 09/06/2016  . Right renal mass   . Status post placement of implantable loop recorder    07-06-2012  . Stroke [facility])    2013 2 mild strokes mild weakness bil sides  . Urgency of urination   . Wears hearing  aid    right ear only    Past Surgical History:  Procedure Laterality Date  . APPENDECTOMY  age 24  . COLONOSCOPY  last one 07-17-2015  . CYSTOSCOPY/RETROGRADE/URETEROSCOPY Right 04/22/2016   Procedure: CYSTOSCOPY/RETROGRADE/URETEROSCOPY/ BIOPSY/ STENT PLACEMENT;  Surgeon: [name], MD;  [facility];  Service: Urology;  Laterality: Right;  . ESOPHAGOGASTRODUODENOSCOPY  last one 09-16-2014  . LOOP RECORDER IMPLANT N/A 07/06/2012   Procedure: LOOP RECORDER IMPLANT;  Surgeon: [name], MD;  Location: [facility] CATH LAB;  Service: Cardiovascular;  Laterality: N/A;  . ROBOT ASSITED LAPAROSCOPIC NEPHROURETERECTOMY Right 05/13/2016   Procedure: XI ROBOT ASSITED LAPAROSCOPIC NEPHROURETERECTOMY;  Surgeon: [name], MD;  [facility];  Service: Urology;  Laterality: Right;  . TEE WITHOUT CARDIOVERSION  12/19/2011   Procedure: TRANSESOPHAGEAL ECHOCARDIOGRAM (TEE);  Surgeon: [name], MD;  Location: [facility] ENDOSCOPY;  Service: Cardiovascular;  Laterality: N/A;  no evidence thrombus in the atrial cavity or appendage;  mild AV thickened w/ mild AI;  minimal fixed plaque in thoracic aorta;  mild MV thickened w/ mild MR;  NO pfo by color doppler or with injection of agitated saline  . TRANSTHORACIC ECHOCARDIOGRAM  09-10-20103   grade 1 diastolic dysfunction,  ef 55-60%/  AV sclerosis  without stenosis/  trivial TR  . WRIST FRACTURE SURGERY Right 2014  approx.   retained hardware     reports that he quit smoking about 51 years ago. His smoking use included cigarettes and pipe. He quit after 5.00 years of use. He has never used smokeless tobacco. He reports that he does not drink alcohol or use drugs.  Allergies  Allergen Reactions  . Acyclovir And Related     Family History  Problem Relation Age of Onset  . Heart Problems Mother   . Brain cancer Father   . Parkinsonism Brother   . Dementia Brother   . Colon cancer Neg Hx   . Esophageal cancer Neg Hx   .  Stomach cancer Neg Hx   . Gastric cancer Neg Hx   . Liver disease Neg Hx   . Kidney disease Neg Hx   . Diabetes Neg Hx     Prior to Admission medications   Medication Sig Start Date End Date Taking? Authorizing Provider  apixaban (ELIQUIS) 5 MG TABS tablet Take 1 tablet (5 mg total) by mouth 2 (two) times daily. 09/06/16  Yes Biagio Borg, MD  cholecalciferol (VITAMIN D) 1000 units tablet Take 1,000 Units by mouth at bedtime.   Yes [provider]  lisinopril (PRINIVIL,ZESTRIL) 5 MG tablet TAKE ONE (1) TABLET EACH DAY 09/06/16  Yes Biagio Borg, MD  tamsulosin [facility]) 0.4 MG CAPS capsule Take 1 capsule (0.4 mg total) by mouth daily after breakfast. 09/06/16  Yes Biagio Borg, MD    Physical Exam: Vitals:   09/04/17 1602 09/04/17 1838 09/04/17 1858 09/04/17 2156  BP: 127/73  136/75 122/74  Pulse: 72  82 (!) 103  Resp: 18  16   Temp:   98.5 F (36.9 C) 99.4 F (37.4 C)  TempSrc:   Oral Oral  SpO2: 99%  97% 93%  Weight:  87.8 kg (193 lb 9 oz)    Height:  6\' 1"  (1.854 m)        Constitutional: Moderately built and nourished. Vitals:   09/04/17 1602 09/04/17 1838 09/04/17 1858 09/04/17 2156  BP: 127/73  136/75 122/74  Pulse: 72  82 (!) 103  Resp: 18  16   Temp:   98.5 F (36.9 C) 99.4 F (37.4 C)  TempSrc:   Oral Oral  SpO2: 99%  97% 93%  Weight:  87.8 kg (193 lb 9 oz)    Height:  6\' 1"  (1.854 m)     Eyes: Anicteric no pallor. ENMT: No discharge from the ears eyes nose or mouth. Neck: No mass found.  No neck rigidity. Respiratory: No rhonchi or crepitations. Cardiovascular: S1-S2 heard. Abdomen: Soft nontender bowel sounds present. Musculoskeletal: Pain on moving left hip. Skin: No rash. Neurologic: Alert awake oriented to time place and person.  Moves all extremities. Psychiatric: Appears normal.   Labs on Admission: I have personally reviewed following labs and imaging studies  CBC: Recent Labs  Lab 09/04/17 1354  WBC 10.4  NEUTROABS 9.5*  HGB  15.0  HCT 42.6  MCV 95.7  PLT 462*   Basic Metabolic Panel: Recent Labs  Lab 09/04/17 1354  NA 138  K 4.1  CL 105  CO2 22  GLUCOSE 114*  BUN 29*  CREATININE 2.05*  CALCIUM 9.2   GFR: Estimated Creatinine Clearance: 33 mL/min (A) (by C-G formula based on SCr of 2.05 mg/dL (H)). Liver Function Tests: No results for input(s): AST, ALT, ALKPHOS, BILITOT, PROT, ALBUMIN in the last  168 hours. No results for input(s): LIPASE, AMYLASE in the last 168 hours. No results for input(s): AMMONIA in the last 168 hours. Coagulation Profile: Recent Labs  Lab 09/04/17 1354  INR 1.12   Cardiac Enzymes: No results for input(s): CKTOTAL, CKMB, CKMBINDEX, TROPONINI in the last 168 hours. BNP (last 3 results) No results for input(s): PROBNP in the last 8760 hours. HbA1C: No results for input(s): HGBA1C in the last 72 hours. CBG: No results for input(s): GLUCAP in the last 168 hours. Lipid Profile: No results for input(s): CHOL, HDL, LDLCALC, TRIG, CHOLHDL, LDLDIRECT in the last 72 hours. Thyroid Function Tests: No results for input(s): TSH, T4TOTAL, FREET4, T3FREE, THYROIDAB in the last 72 hours. Anemia Panel: No results for input(s): VITAMINB12, FOLATE, FERRITIN, TIBC, IRON, RETICCTPCT in the last 72 hours. Urine analysis:    Component Value Date/Time   COLORURINE YELLOW 09/03/2016 0831   APPEARANCEUR CLEAR 09/03/2016 0831   LABSPEC 1.015 09/03/2016 0831   PHURINE 6.0 09/03/2016 0831   GLUCOSEU NEGATIVE 09/03/2016 0831   HGBUR NEGATIVE 09/03/2016 0831   BILIRUBINUR NEGATIVE 09/03/2016 0831   KETONESUR NEGATIVE 09/03/2016 0831   PROTEINUR NEGATIVE 01/04/2012 0851   UROBILINOGEN 0.2 09/03/2016 0831   NITRITE NEGATIVE 09/03/2016 0831   LEUKOCYTESUR NEGATIVE 09/03/2016 0831   Sepsis Labs: @LABRCNTIP (procalcitonin:4,lacticidven:4) )No results found for this or any previous visit (from the past 240 hour(s)).   Radiological Exams on Admission: Dg Chest 1 View  Result Date:  09/04/2017 CLINICAL DATA:  Preop left hip fracture EXAM: CHEST  1 VIEW COMPARISON:  03/07/2017 FINDINGS: The heart size and mediastinal contours are within normal limits. Both lungs are clear. The visualized skeletal structures are unremarkable. IMPRESSION: No active disease. Electronically Signed   By: Kathreen Devoid   On: 09/04/2017 13:58   Dg Hip Unilat With Pelvis 2-3 Views Left  Result Date: 09/04/2017 CLINICAL DATA:  [place] today and landed on lt hip. Lt hip pain. EXAM: DG HIP (WITH OR WITHOUT PELVIS) 2-3V LEFT COMPARISON:  None. FINDINGS: There is an acute fracture of the LEFT femoral neck, associated with impaction and varus angulation. Degenerative changes are seen in both hips. Degenerative changes are seen in the LOWER lumbar spine. IMPRESSION: Acute fracture of the LEFT femoral neck. Electronically Signed   By: Nolon Nations M.D.   On: 09/04/2017 13:17    EKG: Independently reviewed.  Normal sinus rhythm.  Assessment/Plan Principal Problem:   Closed left hip fracture, initial encounter [facility]) Active Problems:   Essential hypertension   Thrombocytopenia (HCC)   Cerebrovascular disease, arteriosclerotic, post-stroke   Renal cell cancer (HCC)   CKD (chronic kidney disease) stage 3, GFR 30-59 ml/min (HCC)   Hip fracture ([place])    1. Left hip fracture status post mechanical fall -appears to be at moderate risk for intermediate risk procedure.  Surgery is planned on September 07, 2017.  Will keep patient on pain relief medications. 2. Atrial fibrillation presently in sinus rhythm.  Not on any rate limiting medications.  Will hold apixaban in anticipation of surgery but since patient has had his previous stroke may start on heparin if okay with orthopedic surgery ankle surgery 2 days from now. 3. Hypertension -since creatinine is mildly elevated from baseline we will hold lisinopril for now and restart after surgery.  PRN IV hydralazine for now. 4. Chronic kidney disease stage III with single  functioning kidney status post nephrectomy for right renal cell carcinoma -follow metabolic panel. 5. Thrombocytopenia appears to be chronic follow CBC.  DVT prophylaxis: SCDs. Code Status: Full code. Family Communication: Wife. Disposition Plan: Probably will need rehab. Consults called: Orthopedics. Admission status: Inpatient.   Rise Patience MD [facility] Pager [phone].  If 7PM-7AM, please contact night-coverage [email or web address] Password [facility]  09/04/2017, 10:55 PM

## 2017-09-04 NOTE — Consult Note (Signed)
Reason for Consult: L hip fx Referring Physician: Jeannett Senior, PA-C (ER)  Jeffrey Hardy is an 82 y.o. male.  HPI: Pt fell 5/30 injuring his L hip and was seen in [facility] ER. Prior to this injury he was ambulating without assistance, doing ADLs without assistance. He denies other injuries from his fall. He is on Eliquis. He was transferred to [facility] hospital for admission and definitive tx of his hip fx.  Past Medical History:  Diagnosis Date  . Anxiety   . Bilateral renal cysts   . Cancer ([place])    renal mass  . Depression   . Diverticulosis of colon   . Erectile dysfunction   . Hematuria   . History of cerebral embolic infarction x2 CVA's ---  neurologist-  [name]--- per pt residual abnormal gait , does need cane   12-16-2011  right frontal embolic infarct and [id number] left frontal embolic infarct (per first MRI previous hemorrgaic cerebral ischemia)  . History of duodenal ulcer    1997  . History of Helicobacter pylori infection    1997  . History of hiatal hernia   . History of loop recorder    Current  . History of syncope    1997  and 2014  s/p  loop recorder  . Hyperlipidemia   . Hypertension   . Nocturia   . PAF (paroxysmal atrial fibrillation) [facility])    cardiologist-  dr [name]  . Renal cell cancer ([place]) 09/06/2016  . Right renal mass   . Status post placement of implantable loop recorder    07-06-2012  . Stroke [facility])    2013 2 mild strokes mild weakness bil sides  . Urgency of urination   . Wears hearing aid    right ear only    Past Surgical History:  Procedure Laterality Date  . APPENDECTOMY  age 32  . COLONOSCOPY  last one 07-17-2015  . CYSTOSCOPY/RETROGRADE/URETEROSCOPY Right 04/22/2016   Procedure: CYSTOSCOPY/RETROGRADE/URETEROSCOPY/ BIOPSY/ STENT PLACEMENT;  Surgeon: [name], MD;  Location: [facility];  Service: Urology;  Laterality: Right;  . ESOPHAGOGASTRODUODENOSCOPY  last one 09-16-2014  . LOOP RECORDER IMPLANT N/A  07/06/2012   Procedure: LOOP RECORDER IMPLANT;  Surgeon: [name], MD;  Location: [facility] CATH LAB;  Service: Cardiovascular;  Laterality: N/A;  . ROBOT ASSITED LAPAROSCOPIC NEPHROURETERECTOMY Right 05/13/2016   Procedure: XI ROBOT ASSITED LAPAROSCOPIC NEPHROURETERECTOMY;  Surgeon: [name], MD;  Location: [facility];  Service: Urology;  Laterality: Right;  . TEE WITHOUT CARDIOVERSION  12/19/2011   Procedure: TRANSESOPHAGEAL ECHOCARDIOGRAM (TEE);  Surgeon: [name], MD;  Location: [facility] ENDOSCOPY;  Service: Cardiovascular;  Laterality: N/A;  no evidence thrombus in the atrial cavity or appendage;  mild AV thickened w/ mild AI;  minimal fixed plaque in thoracic aorta;  mild MV thickened w/ mild MR;  NO pfo by color doppler or with injection of agitated saline  . TRANSTHORACIC ECHOCARDIOGRAM  09-10-20103   grade 1 diastolic dysfunction,  ef 55-60%/  AV sclerosis without stenosis/  trivial TR  . WRIST FRACTURE SURGERY Right 2014  approx.   retained hardware    Family History  Problem Relation Age of Onset  . Heart Problems Mother   . Brain cancer Father   . Parkinsonism Brother   . Dementia Brother   . Colon cancer Neg Hx   . Esophageal cancer Neg Hx   . Stomach cancer Neg Hx   . Gastric cancer Neg Hx   . Liver  disease Neg Hx   . Kidney disease Neg Hx   . Diabetes Neg Hx     Social History:  reports that he quit smoking about 51 years ago. His smoking use included cigarettes and pipe. He quit after 5.00 years of use. He has never used smokeless tobacco. He reports that he does not drink alcohol or use drugs.  Allergies:  Allergies  Allergen Reactions  . Acyclovir And Related     Medications: I have reviewed the patient's current medications.  Results for orders placed or performed during the hospital encounter of 09/04/17 (from the past 48 hour(s))  Basic metabolic panel     Status: Abnormal   Collection Time: 09/04/17  1:54 PM  Result Value Ref Range   Sodium 138 135 - 145  mmol/L   Potassium 4.1 3.5 - 5.1 mmol/L   Chloride 105 101 - 111 mmol/L   CO2 22 22 - 32 mmol/L   Glucose, Bld 114 (H) 65 - 99 mg/dL   BUN 29 (H) 6 - 20 mg/dL   Creatinine, Ser 2.05 (H) 0.61 - 1.24 mg/dL   Calcium 9.2 8.9 - 10.3 mg/dL   GFR calc non Af Amer 29 (L) >60 mL/min   GFR calc Af Amer 34 (L) >60 mL/min    Comment: (NOTE) The eGFR has been calculated using the CKD EPI equation. This calculation has not been validated in all clinical situations. eGFR's persistently <60 mL/min signify possible Chronic Kidney Disease.    Anion gap 11 5 - 15    Comment: Performed at [facility] [place], [address] [place], [place] [address]  CBC WITH DIFFERENTIAL     Status: Abnormal   Collection Time: 09/04/17  1:54 PM  Result Value Ref Range   WBC 10.4 4.0 - 10.5 K/uL   RBC 4.45 4.22 - 5.81 MIL/uL   Hemoglobin 15.0 13.0 - 17.0 g/dL   HCT 42.6 39.0 - 52.0 %   MCV 95.7 78.0 - 100.0 fL   MCH 33.7 26.0 - 34.0 pg   MCHC 35.2 30.0 - 36.0 g/dL   RDW 12.5 11.5 - 15.5 %   Platelets 132 (L) 150 - 400 K/uL   Neutrophils Relative % 92 %   Neutro Abs 9.5 (H) 1.7 - 7.7 K/uL   Lymphocytes Relative 4 %   Lymphs Abs 0.4 (L) 0.7 - 4.0 K/uL   Monocytes Relative 4 %   Monocytes Absolute 0.4 0.1 - 1.0 K/uL   Eosinophils Relative 0 %   Eosinophils Absolute 0.0 0.0 - 0.7 K/uL   Basophils Relative 0 %   Basophils Absolute 0.0 0.0 - 0.1 K/uL    Comment: Performed at [facility] [place], [address] [place], [place] [address]  Protime-INR     Status: None   Collection Time: 09/04/17  1:54 PM  Result Value Ref Range   Prothrombin Time 14.3 11.4 - 15.2 seconds   INR 1.12     Comment: Performed at [facility] [place], [address] [place], [place] [address]    Dg Chest 1 View  Result Date: 09/04/2017 CLINICAL DATA:  Preop left hip fracture EXAM: CHEST  1 VIEW COMPARISON:  03/07/2017 FINDINGS: The heart size and mediastinal contours are within normal limits. Both lungs are clear.  The visualized skeletal structures are unremarkable. IMPRESSION: No active disease. Electronically Signed   By: Hetal  Patel   On: 09/04/2017 13:58   Dg Hip Unilat With Pelvis 2-3 Views Left    Result Date: 09/04/2017 CLINICAL DATA:  [place] today and landed on lt hip. Lt hip pain. EXAM: DG HIP (WITH OR WITHOUT PELVIS) 2-3V LEFT COMPARISON:  None. FINDINGS: There is an acute fracture of the LEFT femoral neck, associated with impaction and varus angulation. Degenerative changes are seen in both hips. Degenerative changes are seen in the LOWER lumbar spine. IMPRESSION: Acute fracture of the LEFT femoral neck. Electronically Signed   By: Nolon Nations M.D.   On: 09/04/2017 13:17    Review of Systems  Constitutional: Negative.   HENT: Negative.   Eyes: Negative.   Respiratory: Negative.   Cardiovascular: Negative.   Gastrointestinal: Negative.   Genitourinary: Negative.   Musculoskeletal: Positive for joint pain.  Skin: Negative.    Blood pressure 136/75, pulse 82, temperature 98.5 F (36.9 C), temperature source Oral, resp. rate 16, height 6' 1" (1.854 m), weight 87.8 kg (193 lb 9 oz), SpO2 97 %. Physical Exam  Constitutional: He is oriented to person, place, and time. He appears well-developed.  HENT:  Head: Normocephalic.  Eyes: Pupils are equal, round, and reactive to light.  Neck: Normal range of motion.  Cardiovascular: Normal rate.  Respiratory: Effort normal.  GI: Soft.  Musculoskeletal:  L leg shortened, externally rotated Groin pain with L hip rotation No calf pain NVI distally Sensation intact  Neurological: He is alert and oriented to person, place, and time.  Skin: Skin is warm and dry.   xrays L hip with femoral neck fx, shortened, slight varus angulation  Assessment/Plan: L femoral neck fx  Plan:  Admit to hospitalist service Discussed with Dr. Tonita Cong and Dr. [name] Will plan for OR for L total hip replacement for Sunday with Dr. [name] due to anticoagulation  and OR availability Risks, complications, alternatives, post-op protocols discussed Hold Eliquis Await clearance from hospitalist team Resume diet today and tomorrow PO pain meds NPO p MN for OR Sunday  Kember Boch M. 09/04/2017, 8:21 PM

## 2017-09-04 NOTE — ED Provider Notes (Signed)
[place] EMERGENCY DEPARTMENT Provider Note   CSN: [id number] Arrival date & time: 09/04/17  1203     History   Chief Complaint Chief Complaint  Patient presents with  . Fall    HPI Jeffrey Hardy is a 82 y.o. male.  HPI  Jeffrey Hardy is a 82 y.o. male with history of anxiety, depression, history of CVAs, atrial fibrillation, renal cell carcinoma with nephrectomy, presents to emergency department after a fall.  Patient was working in his barn, states he was pulling some bales of hay, when he fell onto the left hip.  He did not hit his head or lost consciousness.  He is on Eliquis.  He states he is not having a headache or neck pain.  He states his only pain is in the left hip.  He denies any numbness or weakness to the foot.  No prior hip injuries.  He does have a bad knee on the same leg for which he has been seeing Dr. Alvan Dame. No tx prior to coming in  Past Medical History:  Diagnosis Date  . Anxiety   . Bilateral renal cysts   . Cancer ([place])    renal mass  . Depression   . Diverticulosis of colon   . Erectile dysfunction   . Hematuria   . History of cerebral embolic infarction x2 CVA's ---  neurologist-  [name]--- per pt residual abnormal gait , does need cane   12-16-2011  right frontal embolic infarct and [id number] left frontal embolic infarct (per first MRI previous hemorrgaic cerebral ischemia)  . History of duodenal ulcer    1997  . History of Helicobacter pylori infection    1997  . History of hiatal hernia   . History of loop recorder    Current  . History of syncope    1997  and 2014  s/p  loop recorder  . Hyperlipidemia   . Hypertension   . Nocturia   . PAF (paroxysmal atrial fibrillation) [facility])    cardiologist-  dr [name]  . Renal cell cancer ([place]) 09/06/2016  . Right renal mass   . Status post placement of implantable loop recorder    07-06-2012  . Stroke [facility])    2013 2 mild strokes mild weakness bil sides  . Urgency of urination   .  Wears hearing aid    right ear only    Patient Active Problem List   Diagnosis Date Noted  . Renal cell cancer ([place]) 09/06/2016  . CKD (chronic kidney disease) 09/06/2016  . Urinary frequency 03/08/2016  . Memory dysfunction 03/08/2016  . History of colonic polyps 07/07/2015  . AKI (acute kidney injury) ([place]) 07/04/2015  . Hyperglycemia 07/04/2015  . Cerebrovascular disease, arteriosclerotic, post-stroke 12/28/2013  . Shingles 06/29/2013  . Incontinent of urine 12/29/2012  . Colles' fracture of right radius 12/14/2012  . Syncope 07/14/2012  . Thrombocytopenia ([place]) 01/13/2012  . 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 Preventative health care 07/27/2010  . Hyperlipidemia 05/12/2008  . Essential hypertension 05/12/2008  . GERD 05/12/2008  . DIVERTICULOSIS, COLON 05/12/2008    Past Surgical History:  Procedure Laterality Date  . APPENDECTOMY  age 40  . COLONOSCOPY  last one 07-17-2015  . CYSTOSCOPY/RETROGRADE/URETEROSCOPY Right 04/22/2016   Procedure: CYSTOSCOPY/RETROGRADE/URETEROSCOPY/ BIOPSY/ STENT PLACEMENT;  Surgeon: [name], MD;  Location: [facility];  Service: Urology;  Laterality: Right;  . ESOPHAGOGASTRODUODENOSCOPY  last one 09-16-2014  . LOOP RECORDER IMPLANT N/A 07/06/2012   Procedure:  LOOP RECORDER IMPLANT;  Surgeon: [name], MD;  Location: [facility] CATH LAB;  Service: Cardiovascular;  Laterality: N/A;  . ROBOT ASSITED LAPAROSCOPIC NEPHROURETERECTOMY Right 05/13/2016   Procedure: XI ROBOT ASSITED LAPAROSCOPIC NEPHROURETERECTOMY;  Surgeon: [name], MD;  [facility];  Service: Urology;  Laterality: Right;  . TEE WITHOUT CARDIOVERSION  12/19/2011   Procedure: TRANSESOPHAGEAL ECHOCARDIOGRAM (TEE);  Surgeon: [name], MD;  Location: [facility] ENDOSCOPY;  Service: Cardiovascular;  Laterality: N/A;  no evidence thrombus in the atrial cavity or appendage;  mild AV thickened w/ mild AI;  minimal fixed plaque in thoracic aorta;  mild MV thickened w/ mild  MR;  NO pfo by color doppler or with injection of agitated saline  . TRANSTHORACIC ECHOCARDIOGRAM  09-10-20103   grade 1 diastolic dysfunction,  ef 55-60%/  AV sclerosis without stenosis/  trivial TR  . WRIST FRACTURE SURGERY Right 2014  approx.   retained hardware        Home Medications    Prior to Admission medications   Medication Sig Start Date End Date Taking? Authorizing Provider  apixaban (ELIQUIS) 5 MG TABS tablet Take 1 tablet (5 mg total) by mouth 2 (two) times daily. 09/06/16   Biagio Borg, MD  lisinopril (PRINIVIL,ZESTRIL) 5 MG tablet TAKE ONE (1) TABLET EACH DAY 09/06/16   Biagio Borg, MD  tamsulosin [facility]) 0.4 MG CAPS capsule Take 1 capsule (0.4 mg total) by mouth daily after breakfast. 09/06/16   Biagio Borg, MD    Family History Family History  Problem Relation Age of Onset  . Heart Problems Mother   . Brain cancer Father   . Parkinsonism Brother   . Dementia Brother   . Colon cancer Neg Hx   . Esophageal cancer Neg Hx   . Stomach cancer Neg Hx   . Gastric cancer Neg Hx   . Liver disease Neg Hx   . Kidney disease Neg Hx   . Diabetes Neg Hx     Social History Social History   Tobacco Use  . Smoking status: Former Smoker    Years: 5.00    Types: Cigarettes, Pipe    Last attempt to quit: 09/07/1966    Years since quitting: 51.0  . Smokeless tobacco: Never Used  Substance Use Topics  . Alcohol use: No    Alcohol/week: 0.0 oz  . Drug use: No     Allergies   Acyclovir and related   Review of Systems Review of Systems  Constitutional: Negative for chills and fever.  Respiratory: Negative for cough, chest tightness and shortness of breath.   Cardiovascular: Negative for chest pain, palpitations and leg swelling.  Gastrointestinal: Negative for abdominal distention, abdominal pain, diarrhea, nausea and vomiting.  Genitourinary: Negative for dysuria, frequency, hematuria and urgency.  Musculoskeletal: Positive for arthralgias. Negative for  myalgias, neck pain and neck stiffness.  Skin: Negative for rash.  Allergic/Immunologic: Negative for immunocompromised state.  Neurological: Negative for dizziness, weakness, light-headedness, numbness and headaches.  All other systems reviewed and are negative.    Physical Exam Updated Vital Signs BP 112/82 (BP Location: Right Arm)   Pulse 82   Temp 98.6 F (37 C) (Oral)   Resp 18   Ht 6\' 1"  (1.854 m)   Wt 89.4 kg (197 lb)   SpO2 96%   BMI 25.99 kg/m   Physical Exam  Constitutional: He appears well-developed and well-nourished. No distress.  HENT:  Head: Normocephalic and atraumatic.  No signs of trauma or  contusions  Eyes: Conjunctivae are normal.  Neck: Neck supple.  No midline tenderness  Cardiovascular: Normal rate, regular rhythm and normal heart sounds.  Pulmonary/Chest: Effort normal. No respiratory distress. He has no wheezes. He has no rales.  Abdominal: Soft. Bowel sounds are normal. He exhibits no distension. There is no tenderness. There is no rebound.  Musculoskeletal: He exhibits no edema.  Tenderness to palpation over left lateral hip and left groin area.  Unable to move due to pain.  No tenderness over the knee joint or ankle.  Distal dorsal pedal pulses are intact.  No pelvic tenderness otherwise.  Neurological: He is alert.  Sensation is intact to the distal foot and leg on the left side.  Skin: Skin is warm and dry.  Nursing note and vitals reviewed.    ED Treatments / Results  Labs (all labs ordered are listed, but only abnormal results are displayed) Labs Reviewed  BASIC METABOLIC PANEL - Abnormal; Notable for the following components:      Result Value   Glucose, Bld 114 (*)    BUN 29 (*)    Creatinine, Ser 2.05 (*)    GFR calc non Af Amer 29 (*)    GFR calc Af Amer 34 (*)    All other components within normal limits  CBC WITH DIFFERENTIAL/PLATELET - Abnormal; Notable for the following components:   Platelets 132 (*)    Neutro Abs 9.5 (*)     Lymphs Abs 0.4 (*)    All other components within normal limits  PROTIME-INR    EKG EKG Interpretation  Date/Time:  Thursday Sep 04 2017 13:36:45 EDT Ventricular Rate:  86 PR Interval:    QRS Duration: 97 QT Interval:  363 QTC Calculation: 435 R Axis:   79 Text Interpretation:  Sinus rhythm Posterior infarct, old Nonspecific T abnormalities, inferior leads No significant change since last tracing Confirmed by Theotis Burrow [phone]) on 09/04/2017 1:53:48 PM   Radiology Dg Hip Unilat With Pelvis 2-3 Views Left  Result Date: 09/04/2017 CLINICAL DATA:  [place] today and landed on lt hip. Lt hip pain. EXAM: DG HIP (WITH OR WITHOUT PELVIS) 2-3V LEFT COMPARISON:  None. FINDINGS: There is an acute fracture of the LEFT femoral neck, associated with impaction and varus angulation. Degenerative changes are seen in both hips. Degenerative changes are seen in the LOWER lumbar spine. IMPRESSION: Acute fracture of the LEFT femoral neck. Electronically Signed   By: Nolon Nations M.D.   On: 09/04/2017 13:17    Procedures Procedures (including critical care time)  Medications Ordered in ED Medications  fentaNYL (SUBLIMAZE) injection 50 mcg (has no administration in time range)  ondansetron (ZOFRAN) injection 4 mg (has no administration in time range)     Initial Impression / Assessment and Plan / ED Course  I have reviewed the triage vital signs and the nursing notes.  Pertinent labs & imaging results that were available during my care of the patient were reviewed by me and considered in my medical decision making (see chart for details).     Patient in emergency department after mechanical fall onto the left hip.  No head trauma although he is on Eliquis, currently denies any headache.  No signs of trauma to the head on exam.  Will hold off on CT during his head.  X-ray of the hip shows acute fracture of the left femoral neck.  Will consult orthopedics.  Patient has seen agrees for  orthopedics in the past and would like the  same service.  Spoke with the [facility], they will see patient once he gets her room and admitted to the hospital. Spoke with [facility], they will admit patient to [facility] long.  Discussed the plan with patient and his wife, agree.  Patient is receiving fentanyl for his pain, pain is managed very well at this time.  Vitals:   09/04/17 1346 09/04/17 1430 09/04/17 1506 09/04/17 1602  BP:  140/78 130/78 127/73  Pulse: 77 80 74 72  Resp: (!) 24  20 18   Temp:      TempSrc:      SpO2: 99% 100% 98% 99%  Weight:      Height:        Final Clinical Impressions(s) / ED Diagnoses   Final diagnoses:  Closed fracture of left hip, initial encounter [facility])    ED Discharge Orders    None       Jeannett Senior, PA-C 09/04/17 Duanne Limerick, MD 09/05/17 [phone]

## 2017-09-04 NOTE — ED Triage Notes (Signed)
Presents post fall in the woods while trying to move the hay, c/o left hip and leg pain. He is hurting in upper left leg and hip and reports that it is weak and difficult to lift. He is sitting in a wheelchair. Unable to bear weight on left hip and leg. HE denies hitting his head or chest. Dnie other sites of injury.

## 2017-09-04 NOTE — H&P (View-Only) (Signed)
Reason for Consult: L hip fx Referring Physician: Jeannett Senior, PA-C (ER)  Jeffrey Hardy is an 82 y.o. male.  HPI: Pt fell 5/30 injuring his L hip and was seen in [facility] ER. Prior to this injury he was ambulating without assistance, doing ADLs without assistance. He denies other injuries from his fall. He is on Eliquis. He was transferred to [facility] hospital for admission and definitive tx of his hip fx.  Past Medical History:  Diagnosis Date  . Anxiety   . Bilateral renal cysts   . Cancer ([place])    renal mass  . Depression   . Diverticulosis of colon   . Erectile dysfunction   . Hematuria   . History of cerebral embolic infarction x2 CVA's ---  neurologist-  [name]--- per pt residual abnormal gait , does need cane   12-16-2011  right frontal embolic infarct and [id number] left frontal embolic infarct (per first MRI previous hemorrgaic cerebral ischemia)  . History of duodenal ulcer    1997  . History of Helicobacter pylori infection    1997  . History of hiatal hernia   . History of loop recorder    Current  . History of syncope    1997  and 2014  s/p  loop recorder  . Hyperlipidemia   . Hypertension   . Nocturia   . PAF (paroxysmal atrial fibrillation) [facility])    cardiologist-  dr [name]  . Renal cell cancer ([place]) 09/06/2016  . Right renal mass   . Status post placement of implantable loop recorder    07-06-2012  . Stroke [facility])    2013 2 mild strokes mild weakness bil sides  . Urgency of urination   . Wears hearing aid    right ear only    Past Surgical History:  Procedure Laterality Date  . APPENDECTOMY  age 32  . COLONOSCOPY  last one 07-17-2015  . CYSTOSCOPY/RETROGRADE/URETEROSCOPY Right 04/22/2016   Procedure: CYSTOSCOPY/RETROGRADE/URETEROSCOPY/ BIOPSY/ STENT PLACEMENT;  Surgeon: [name], MD;  Location: [facility];  Service: Urology;  Laterality: Right;  . ESOPHAGOGASTRODUODENOSCOPY  last one 09-16-2014  . LOOP RECORDER IMPLANT N/A  07/06/2012   Procedure: LOOP RECORDER IMPLANT;  Surgeon: [name], MD;  Location: [facility] CATH LAB;  Service: Cardiovascular;  Laterality: N/A;  . ROBOT ASSITED LAPAROSCOPIC NEPHROURETERECTOMY Right 05/13/2016   Procedure: XI ROBOT ASSITED LAPAROSCOPIC NEPHROURETERECTOMY;  Surgeon: [name], MD;  Location: [facility] ORS;  Service: Urology;  Laterality: Right;  . TEE WITHOUT CARDIOVERSION  12/19/2011   Procedure: TRANSESOPHAGEAL ECHOCARDIOGRAM (TEE);  Surgeon: [name], MD;  Location: [facility] ENDOSCOPY;  Service: Cardiovascular;  Laterality: N/A;  no evidence thrombus in the atrial cavity or appendage;  mild AV thickened w/ mild AI;  minimal fixed plaque in thoracic aorta;  mild MV thickened w/ mild MR;  NO pfo by color doppler or with injection of agitated saline  . TRANSTHORACIC ECHOCARDIOGRAM  09-10-20103   grade 1 diastolic dysfunction,  ef 55-60%/  AV sclerosis without stenosis/  trivial TR  . WRIST FRACTURE SURGERY Right 2014  approx.   retained hardware    Family History  Problem Relation Age of Onset  . Heart Problems Mother   . Brain cancer Father   . Parkinsonism Brother   . Dementia Brother   . Colon cancer Neg Hx   . Esophageal cancer Neg Hx   . Stomach cancer Neg Hx   . Gastric cancer Neg Hx   . Liver  disease Neg Hx   . Kidney disease Neg Hx   . Diabetes Neg Hx     Social History:  reports that he quit smoking about 51 years ago. His smoking use included cigarettes and pipe. He quit after 5.00 years of use. He has never used smokeless tobacco. He reports that he does not drink alcohol or use drugs.  Allergies:  Allergies  Allergen Reactions  . Acyclovir And Related     Medications: I have reviewed the patient's current medications.  Results for orders placed or performed during the hospital encounter of 09/04/17 (from the past 48 hour(s))  Basic metabolic panel     Status: Abnormal   Collection Time: 09/04/17  1:54 PM  Result Value Ref Range   Sodium 138 135 - 145  mmol/L   Potassium 4.1 3.5 - 5.1 mmol/L   Chloride 105 101 - 111 mmol/L   CO2 22 22 - 32 mmol/L   Glucose, Bld 114 (H) 65 - 99 mg/dL   BUN 29 (H) 6 - 20 mg/dL   Creatinine, Ser 2.05 (H) 0.61 - 1.24 mg/dL   Calcium 9.2 8.9 - 10.3 mg/dL   GFR calc non Af Amer 29 (L) >60 mL/min   GFR calc Af Amer 34 (L) >60 mL/min    Comment: (NOTE) The eGFR has been calculated using the CKD EPI equation. This calculation has not been validated in all clinical situations. eGFR's persistently <60 mL/min signify possible Chronic Kidney Disease.    Anion gap 11 5 - 15    Comment: Performed at [facility], [address] [place], [place] [address]  CBC WITH DIFFERENTIAL     Status: Abnormal   Collection Time: 09/04/17  1:54 PM  Result Value Ref Range   WBC 10.4 4.0 - 10.5 K/uL   RBC 4.45 4.22 - 5.81 MIL/uL   Hemoglobin 15.0 13.0 - 17.0 g/dL   HCT 42.6 39.0 - 52.0 %   MCV 95.7 78.0 - 100.0 fL   MCH 33.7 26.0 - 34.0 pg   MCHC 35.2 30.0 - 36.0 g/dL   RDW 12.5 11.5 - 15.5 %   Platelets 132 (L) 150 - 400 K/uL   Neutrophils Relative % 92 %   Neutro Abs 9.5 (H) 1.7 - 7.7 K/uL   Lymphocytes Relative 4 %   Lymphs Abs 0.4 (L) 0.7 - 4.0 K/uL   Monocytes Relative 4 %   Monocytes Absolute 0.4 0.1 - 1.0 K/uL   Eosinophils Relative 0 %   Eosinophils Absolute 0.0 0.0 - 0.7 K/uL   Basophils Relative 0 %   Basophils Absolute 0.0 0.0 - 0.1 K/uL    Comment: Performed at [facility], [address] [place], [place] [address]  Protime-INR     Status: None   Collection Time: 09/04/17  1:54 PM  Result Value Ref Range   Prothrombin Time 14.3 11.4 - 15.2 seconds   INR 1.12     Comment: Performed at [facility], [address] [place], [place] [address]    Dg Chest 1 View  Result Date: 09/04/2017 CLINICAL DATA:  Preop left hip fracture EXAM: CHEST  1 VIEW COMPARISON:  03/07/2017 FINDINGS: The heart size and mediastinal contours are within normal limits. Both lungs are clear.  The visualized skeletal structures are unremarkable. IMPRESSION: No active disease. Electronically Signed   By: Kathreen Devoid   On: 09/04/2017 13:58   Dg Hip Unilat With Pelvis 2-3 Views Left  Result Date: 09/04/2017 CLINICAL DATA:  [place] today and landed on lt hip. Lt hip pain. EXAM: DG HIP (WITH OR WITHOUT PELVIS) 2-3V LEFT COMPARISON:  None. FINDINGS: There is an acute fracture of the LEFT femoral neck, associated with impaction and varus angulation. Degenerative changes are seen in both hips. Degenerative changes are seen in the LOWER lumbar spine. IMPRESSION: Acute fracture of the LEFT femoral neck. Electronically Signed   By: Nolon Nations M.D.   On: 09/04/2017 13:17    Review of Systems  Constitutional: Negative.   HENT: Negative.   Eyes: Negative.   Respiratory: Negative.   Cardiovascular: Negative.   Gastrointestinal: Negative.   Genitourinary: Negative.   Musculoskeletal: Positive for joint pain.  Skin: Negative.    Blood pressure 136/75, pulse 82, temperature 98.5 F (36.9 C), temperature source Oral, resp. rate 16, height 6' 1" (1.854 m), weight 87.8 kg (193 lb 9 oz), SpO2 97 %. Physical Exam  Constitutional: He is oriented to person, place, and time. He appears well-developed.  HENT:  Head: Normocephalic.  Eyes: Pupils are equal, round, and reactive to light.  Neck: Normal range of motion.  Cardiovascular: Normal rate.  Respiratory: Effort normal.  GI: Soft.  Musculoskeletal:  L leg shortened, externally rotated Groin pain with L hip rotation No calf pain NVI distally Sensation intact  Neurological: He is alert and oriented to person, place, and time.  Skin: Skin is warm and dry.   xrays L hip with femoral neck fx, shortened, slight varus angulation  Assessment/Plan: L femoral neck fx  Plan:  Admit to hospitalist service Discussed with Dr. Tonita Cong and Dr. [name] Will plan for OR for L total hip replacement for Sunday with Dr. [name] due to anticoagulation  and OR availability Risks, complications, alternatives, post-op protocols discussed Hold Eliquis Await clearance from hospitalist team Resume diet today and tomorrow PO pain meds NPO p MN for OR Sunday  Jill Stopka M. 09/04/2017, 8:21 PM

## 2017-09-05 DIAGNOSIS — Z8673 Personal history of transient ischemic attack (TIA), and cerebral infarction without residual deficits: Secondary | ICD-10-CM

## 2017-09-05 DIAGNOSIS — I672 Cerebral atherosclerosis: Secondary | ICD-10-CM

## 2017-09-05 LAB — BASIC METABOLIC PANEL
Anion gap: 6 (ref 5–15)
BUN: 30 mg/dL — ABNORMAL HIGH (ref 6–20)
CO2: 28 mmol/L (ref 22–32)
Calcium: 8.8 mg/dL — ABNORMAL LOW (ref 8.9–10.3)
Chloride: 107 mmol/L (ref 101–111)
Creatinine, Ser: 2.1 mg/dL — ABNORMAL HIGH (ref 0.61–1.24)
GFR calc Af Amer: 33 mL/min — ABNORMAL LOW (ref >60)
GFR calc non Af Amer: 28 mL/min — ABNORMAL LOW (ref >60)
Glucose, Bld: 117 mg/dL — ABNORMAL HIGH (ref 65–99)
Potassium: 4.3 mmol/L (ref 3.5–5.1)
Sodium: 141 mmol/L (ref 135–145)

## 2017-09-05 LAB — TYPE AND SCREEN
ABO/RH(D): B POS
Antibody Screen: NEGATIVE

## 2017-09-05 LAB — CBC
HCT: 39.6 % (ref 39.0–52.0)
Hemoglobin: 13.4 g/dL (ref 13.0–17.0)
MCH: 33.3 pg (ref 26.0–34.0)
MCHC: 33.8 g/dL (ref 30.0–36.0)
MCV: 98.3 fL (ref 78.0–100.0)
Platelets: 115 10*3/uL — ABNORMAL LOW (ref 150–400)
RBC: 4.03 MIL/uL — ABNORMAL LOW (ref 4.22–5.81)
RDW: 13 % (ref 11.5–15.5)
WBC: 8.4 10*3/uL (ref 4.0–10.5)

## 2017-09-05 MED ORDER — OXYCODONE HCL 5 MG PO TABS
5.0000 mg | ORAL_TABLET | Freq: Four times a day (QID) | ORAL | Status: DC | PRN
  Administered 2017-09-06: 5 mg via ORAL
  Filled 2017-09-05: qty 1
  Filled 2017-09-05: qty 2

## 2017-09-05 MED ORDER — HEPARIN SODIUM (PORCINE) 5000 UNIT/ML IJ SOLN
5000.0000 [IU] | Freq: Three times a day (TID) | INTRAMUSCULAR | Status: AC
  Administered 2017-09-05 – 2017-09-06 (×5): 5000 [IU] via SUBCUTANEOUS
  Filled 2017-09-05 (×5): qty 1

## 2017-09-05 MED ORDER — ACETAMINOPHEN 325 MG PO TABS
650.0000 mg | ORAL_TABLET | Freq: Four times a day (QID) | ORAL | Status: DC | PRN
  Administered 2017-09-05: 650 mg via ORAL
  Filled 2017-09-05: qty 2

## 2017-09-05 MED ORDER — ENSURE ENLIVE PO LIQD
237.0000 mL | Freq: Two times a day (BID) | ORAL | Status: DC
  Administered 2017-09-05 – 2017-09-10 (×10): 237 mL via ORAL

## 2017-09-05 MED ORDER — OXYCODONE HCL 5 MG PO TABS
5.0000 mg | ORAL_TABLET | ORAL | Status: DC | PRN
  Administered 2017-09-05 (×2): 10 mg via ORAL
  Filled 2017-09-05 (×2): qty 2

## 2017-09-05 MED ORDER — METHOCARBAMOL 500 MG PO TABS
500.0000 mg | ORAL_TABLET | Freq: Four times a day (QID) | ORAL | Status: DC | PRN
  Administered 2017-09-05 – 2017-09-08 (×5): 500 mg via ORAL
  Filled 2017-09-05 (×5): qty 1

## 2017-09-05 MED ORDER — BACLOFEN 10 MG PO TABS
10.0000 mg | ORAL_TABLET | Freq: Three times a day (TID) | ORAL | Status: DC | PRN
  Administered 2017-09-05: 10 mg via ORAL
  Filled 2017-09-05: qty 1

## 2017-09-05 NOTE — Progress Notes (Signed)
Initial Nutrition Assessment  DOCUMENTATION CODES:   Not applicable  INTERVENTION:  Ensure Enlive po BID, each supplement provides 350 kcal and 20 grams of protein  NUTRITION DIAGNOSIS:   Increased nutrient needs related to hip fracture as evidenced by estimated needs.  GOAL:   Patient will meet greater than or equal to 90% of their needs  MONITOR:   PO intake, Supplement acceptance, Weight trends, Skin, I & O's  REASON FOR ASSESSMENT:   Consult Hip fracture protocol  ASSESSMENT:   82 y.o. M admitted on 09/04/17 for L hip fracture due to fall. PMH of HLD, HTN, depression/anxiety, PAF, hx cancerous R renal mass (2018) and nephrectomy, CKD stg III, a fib, [place]oke, diverticulosis of colon.   Per orthopedist note, pt to have surgery 09/07/17 due to use of Eliquis.  Per chart review pt is eating 90-100% of meals.   Pt in bed with wife at bedside. Pt reports being weight stable "around 200 lbs"; wife reports that no matter what he eats he won't lose or gain any weight. Pt reports eating 3 meals a day with about two fistfuls at every meal. Pt reports that even though he eats well and has a good appetite, his appetite has decreased over the years. Pt ate 90% of lunch reporting that "its hard to eat in this position". Pt and wife reports pt didn't eat breakfast this am due to pt forgetting to order breakfast until wife came. Pt is open to having supplement ordered. Discussed with pt and wife that pt has increased needs due to hip fracture.   Medications reviewed: senokot, robaxin, morphine, oxycodone.   Labs reviewed: BG 114 (H), BUN 29 (H), creatinine 2.05 (H), GFR 29 (L), platelets 132 (L).   NUTRITION - FOCUSED PHYSICAL EXAM:  Suspect mild wasting due to age related sarcopenia.     Most Recent Value  Orbital Region  No depletion  Upper Arm Region  No depletion  Thoracic and Lumbar Region  No depletion  Buccal Region  No depletion  Temple Region  No depletion  Clavicle Bone  Region  No depletion  Clavicle and Acromion Bone Region  No depletion  Scapular Bone Region  Unable to assess  Dorsal Hand  No depletion  Patellar Region  Mild depletion  Anterior Thigh Region  Mild depletion  Posterior Calf Region  Mild depletion  Edema (RD Assessment)  Mild  Hair  Reviewed  Eyes  Reviewed  Mouth  Reviewed  Skin  Reviewed  Nails  Reviewed      Diet Order:   Diet Order           Diet NPO time specified  Diet effective now        Diet Heart Room service appropriate? Yes; Fluid consistency: Thin  Diet effective now          EDUCATION NEEDS:   Education needs have been addressed  Skin:  Skin Assessment: Reviewed RN Assessment  Last BM:  09/02/17  Height:   Ht Readings from Last 1 Encounters:  09/04/17 6\' 1"  (1.854 m)    Weight:   Wt Readings from Last 1 Encounters:  09/04/17 193 lb 9 oz (87.8 kg)    Ideal Body Weight:  83.63 kg  BMI:  Body mass index is 25.54 kg/m.  Estimated Nutritional Needs:   Kcal:  2000-2200 kcal  Protein:  100-115 grams  Fluid:  >/= 2L or per MD    Hope Budds, Dietetic Intern

## 2017-09-05 NOTE — Progress Notes (Signed)
PROGRESS NOTE  Jeffrey Hardy [id number] DOB: April 04, 1936 DOA: 09/04/2017 PCP: Biagio Borg, MD  HPI/Recap of past 24 hours: Jeffrey Hardy is a 82 y.o. male with history of atrial fibrillation on Eliquis, hypertension, chronic kidney disease stage III, history of renal cell carcinoma status post right-sided nephrectomy had a fall at home while walking in his barn.  Patient fell onto his hip and since it was hurting came to the ER.  Denies hitting his head or losing consciousness denies any palpitation chest pain or shortness of breath.  Had taken his apixaban this morning.  ED Course: In the ER x-rays revealed left hip fracture.  On-call orthopedic surgeon was consulted.  Since patient has had apixaban today surgery is planned to be done on Sunday 2 days from now.  09/05/2017: Patient seen and examined at bedside.  Has no new complaints.  Surgery planned on Sunday, June 2..  Assessment/Plan: Principal Problem:   Closed left hip fracture, initial encounter [facility]) Active Problems:   Essential hypertension   Thrombocytopenia (HCC)   Cerebrovascular disease, arteriosclerotic, post-stroke   Renal cell cancer (HCC)   CKD (chronic kidney disease) stage 3, GFR 30-59 ml/min (HCC)   Hip fracture (HCC)  Left hip fracture status post mechanical fall Continue pain management and bowel regimen Possible left hip repair on Sunday, June 2 Orthopedic surgery following  Paroxysmal A. fib on anticoagulation Eliquis Continue to hold anticoagulation due to planned surgery Rate controlled Heparin drip if okay with surgery  Hypertension Blood pressure stable  AKI on CKD 3 Avoid nephrotoxic agents Monitor urine output Repeat BMP in the morning  Chronic thrombocytopenia Platelets 115 Continue to monitor     Code Status: Full code  Family Communication: None at bedside  Disposition Plan: SNF post surgery   Consultants:  Orthopedic  surgery  Procedures:  None  Antimicrobials:  None  DVT prophylaxis: Subcu heparin 3 times daily   Objective: Vitals:   09/04/17 2156 09/05/17 0505 09/05/17 0927 09/05/17 1316  BP: 122/74 128/69 123/72 133/70  Pulse: (!) 103 72 72 76  Resp: 16 16 16 16   Temp: 99.4 F (37.4 C) 99.6 F (37.6 C) 98.5 F (36.9 C) 98 F (36.7 C)  TempSrc: Oral Oral Oral Oral  SpO2: 93% 93% 96% 94%  Weight:      Height:        Intake/Output Summary (Last 24 hours) at 09/05/2017 1443 Last data filed at 09/05/2017 1300 Gross per 24 hour  Intake 440 ml  Output 675 ml  Net -235 ml   Filed Weights   09/04/17 1230 09/04/17 1838  Weight: 89.4 kg (197 lb) 87.8 kg (193 lb 9 oz)    Exam:  . General: 82 y.o. year-old male well developed well nourished in no acute distress.  Alert and oriented x3. . Cardiovascular: Regular rate and rhythm with no rubs or gallops.  No thyromegaly or JVD noted.   [name] Respiratory: Clear to auscultation with no wheezes or rales. Good inspiratory effort. . Abdomen: Soft nontender nondistended with normal bowel sounds x4 quadrants. . Musculoskeletal: No lower extremity edema. 2/4 pulses in all 4 extremities. . Skin: No ulcerative lesions noted or rashes, . Psychiatry: Mood is appropriate for condition and setting   Data Reviewed: CBC: Recent Labs  Lab 09/04/17 1354 09/05/17 0608  WBC 10.4 8.4  NEUTROABS 9.5*  --   HGB 15.0 13.4  HCT 42.6 39.6  MCV 95.7 98.3  PLT 132* 564*   Basic Metabolic Panel:  Recent Labs  Lab 09/04/17 1354 09/05/17 0608  NA 138 141  K 4.1 4.3  CL 105 107  CO2 22 28  GLUCOSE 114* 117*  BUN 29* 30*  CREATININE 2.05* 2.10*  CALCIUM 9.2 8.8*   GFR: Estimated Creatinine Clearance: 32.2 mL/min (A) (by C-G formula based on SCr of 2.1 mg/dL (H)). Liver Function Tests: No results for input(s): AST, ALT, ALKPHOS, BILITOT, PROT, ALBUMIN in the last 168 hours. No results for input(s): LIPASE, AMYLASE in the last 168 hours. No results  for input(s): AMMONIA in the last 168 hours. Coagulation Profile: Recent Labs  Lab 09/04/17 1354  INR 1.12   Cardiac Enzymes: No results for input(s): CKTOTAL, CKMB, CKMBINDEX, TROPONINI in the last 168 hours. BNP (last 3 results) No results for input(s): PROBNP in the last 8760 hours. HbA1C: No results for input(s): HGBA1C in the last 72 hours. CBG: No results for input(s): GLUCAP in the last 168 hours. Lipid Profile: No results for input(s): CHOL, HDL, LDLCALC, TRIG, CHOLHDL, LDLDIRECT in the last 72 hours. Thyroid Function Tests: No results for input(s): TSH, T4TOTAL, FREET4, T3FREE, THYROIDAB in the last 72 hours. Anemia Panel: No results for input(s): VITAMINB12, FOLATE, FERRITIN, TIBC, IRON, RETICCTPCT in the last 72 hours. Urine analysis:    Component Value Date/Time   COLORURINE YELLOW 09/03/2016 0831   APPEARANCEUR CLEAR 09/03/2016 0831   LABSPEC 1.015 09/03/2016 0831   PHURINE 6.0 09/03/2016 0831   GLUCOSEU NEGATIVE 09/03/2016 0831   HGBUR NEGATIVE 09/03/2016 0831   BILIRUBINUR NEGATIVE 09/03/2016 0831   KETONESUR NEGATIVE 09/03/2016 0831   PROTEINUR NEGATIVE 01/04/2012 0851   UROBILINOGEN 0.2 09/03/2016 0831   NITRITE NEGATIVE 09/03/2016 0831   LEUKOCYTESUR NEGATIVE 09/03/2016 0831   Sepsis Labs: @LABRCNTIP (procalcitonin:4,lacticidven:4)  )No results found for this or any previous visit (from the past 240 hour(s)).    Studies: No results found.  Scheduled Meds: . feeding supplement (ENSURE ENLIVE)  237 mL Oral BID BM  . senna  1 tablet Oral Daily  . tamsulosin  0.4 mg Oral QPC breakfast    Continuous Infusions:   LOS: 1 day     Kayleen Memos, MD [facility] Pager [phone]  If 7PM-7AM, please contact night-coverage [email or web address] Password [facility] 09/05/2017, 2:43 PM

## 2017-09-05 NOTE — Progress Notes (Signed)
Patient seen and examined.  He has a history of atrial fibrillation on chronic Eliquis, hypertension, stage III chronic kidney disease, history of kidney cancer status post right-sided nephrectomy.  He was walking in his barn yesterday, when he fell onto his left hip.  He had left hip pain and inability to weight-bear.  He went to [place], where x-rays revealed a displaced, comminuted left femoral neck fracture.  He was transferred to [facility].  He was admitted by the hospitalist.  He took Eliquis yesterday.  I had a lengthy discussion with the patient.  Given his high activity level and the nature of the fracture, he is indicated for left total hip arthroplasty.  We discussed the risks, benefits, and alternatives.  Due to his Eliquis usage, surgery will need to be on Sunday.  N.p.o. after midnight on Saturday night.  Hold chemical DVT prophylaxis.  Postoperatively, he should be able to weight-bear as tolerated with a walker.  We will plan for discharge home with home health physical therapy.  All questions were solicited and answered.

## 2017-09-06 LAB — CBC
HCT: 40 % (ref 39.0–52.0)
Hemoglobin: 13.3 g/dL (ref 13.0–17.0)
MCH: 32.4 pg (ref 26.0–34.0)
MCHC: 33.3 g/dL (ref 30.0–36.0)
MCV: 97.3 fL (ref 78.0–100.0)
Platelets: 92 10*3/uL — ABNORMAL LOW (ref 150–400)
RBC: 4.11 MIL/uL — ABNORMAL LOW (ref 4.22–5.81)
RDW: 13.2 % (ref 11.5–15.5)
WBC: 8.5 10*3/uL (ref 4.0–10.5)

## 2017-09-06 LAB — GLUCOSE, CAPILLARY: Glucose-Capillary: 118 mg/dL — ABNORMAL HIGH (ref 65–99)

## 2017-09-06 LAB — BASIC METABOLIC PANEL
Anion gap: 10 (ref 5–15)
BUN: 34 mg/dL — ABNORMAL HIGH (ref 6–20)
CO2: 25 mmol/L (ref 22–32)
Calcium: 8.9 mg/dL (ref 8.9–10.3)
Chloride: 103 mmol/L (ref 101–111)
Creatinine, Ser: 2.05 mg/dL — ABNORMAL HIGH (ref 0.61–1.24)
GFR calc Af Amer: 34 mL/min — ABNORMAL LOW (ref >60)
GFR calc non Af Amer: 29 mL/min — ABNORMAL LOW (ref >60)
Glucose, Bld: 135 mg/dL — ABNORMAL HIGH (ref 65–99)
Potassium: 4 mmol/L (ref 3.5–5.1)
Sodium: 138 mmol/L (ref 135–145)

## 2017-09-06 MED ORDER — CHLORPROMAZINE HCL 10 MG PO TABS
10.0000 mg | ORAL_TABLET | Freq: Three times a day (TID) | ORAL | Status: DC
  Administered 2017-09-06 – 2017-09-10 (×10): 10 mg via ORAL
  Filled 2017-09-06 (×14): qty 1

## 2017-09-06 MED ORDER — OXYCODONE HCL 5 MG PO TABS: 5.0000 mg | ORAL_TABLET | Freq: Four times a day (QID) | ORAL | Status: DC | PRN

## 2017-09-06 MED ORDER — CHLORHEXIDINE GLUCONATE 4 % EX LIQD
60.0000 mL | Freq: Once | CUTANEOUS | Status: AC
  Administered 2017-09-07: 4 via TOPICAL
  Filled 2017-09-06: qty 60

## 2017-09-06 MED ORDER — TRANEXAMIC ACID 1000 MG/10ML IV SOLN
1000.0000 mg | INTRAVENOUS | Status: AC
  Administered 2017-09-07: 1000 mg via INTRAVENOUS
  Filled 2017-09-06: qty 1100

## 2017-09-06 MED ORDER — ACETAMINOPHEN 325 MG PO TABS
650.0000 mg | ORAL_TABLET | Freq: Four times a day (QID) | ORAL | Status: DC | PRN
  Administered 2017-09-08 – 2017-09-10 (×2): 650 mg via ORAL
  Filled 2017-09-06 (×2): qty 2

## 2017-09-06 MED ORDER — ACETAMINOPHEN 325 MG PO TABS: 650.0000 mg | ORAL_TABLET | Freq: Four times a day (QID) | ORAL | Status: DC | PRN

## 2017-09-06 MED ORDER — ERYTHROMYCIN 5 MG/GM OP OINT
TOPICAL_OINTMENT | Freq: Two times a day (BID) | OPHTHALMIC | Status: AC
  Administered 2017-09-06: 15:00:00 via OPHTHALMIC
  Administered 2017-09-06 – 2017-09-07 (×2): 1 via OPHTHALMIC
  Administered 2017-09-08 (×2): via OPHTHALMIC
  Filled 2017-09-06: qty 1

## 2017-09-06 MED ORDER — POVIDONE-IODINE 10 % EX SWAB
2.0000 "application " | Freq: Once | CUTANEOUS | Status: AC
  Administered 2017-09-07: 2 via TOPICAL

## 2017-09-06 MED ORDER — ACETAMINOPHEN 10 MG/ML IV SOLN
1000.0000 mg | INTRAVENOUS | Status: AC
  Administered 2017-09-07: 1000 mg via INTRAVENOUS

## 2017-09-06 MED ORDER — OXYCODONE HCL 5 MG PO TABS
5.0000 mg | ORAL_TABLET | Freq: Four times a day (QID) | ORAL | Status: DC | PRN
  Administered 2017-09-06: 5 mg via ORAL
  Filled 2017-09-06: qty 1

## 2017-09-06 MED ORDER — CEFAZOLIN SODIUM-DEXTROSE 2-4 GM/100ML-% IV SOLN
2.0000 g | INTRAVENOUS | Status: AC
  Administered 2017-09-07: 2 g via INTRAVENOUS

## 2017-09-06 NOTE — Progress Notes (Signed)
Pt having slightly thick tan discharge from eyes. Dr Nevada Crane notified. Jeffrey Hardy, [employer]

## 2017-09-06 NOTE — Progress Notes (Signed)
PROGRESS NOTE  Jeffrey Hardy [id number] DOB: July 13, 1935 DOA: 09/04/2017 PCP: Biagio Borg, MD  HPI/Recap of past 24 hours: Jeffrey Hardy is a 82 y.o. male with history of atrial fibrillation on Eliquis, hypertension, chronic kidney disease stage III, history of renal cell carcinoma status post right-sided nephrectomy had a fall at home while walking in his barn.  Patient fell onto his hip and since it was hurting came to the ER.  Denies hitting his head or losing consciousness denies any palpitation chest pain or shortness of breath.  Had taken his apixaban this morning.  ED Course: In the ER x-rays revealed left hip fracture.  On-call orthopedic surgeon was consulted.  Since patient has had apixaban today surgery is planned to be done on Sunday 2 days from now.  09/05/2017: Patient seen and examined at bedside.  Has no new complaints.  Surgery planned on Sunday, June 2.  09/06/17: B/l  Eye tan discharge suspect conjunctivitis. erythromyjcin ointment ordered. No other complaints.  Assessment/Plan: Principal Problem:   Closed left hip fracture, initial encounter [facility]) Active Problems:   Essential hypertension   Thrombocytopenia (HCC)   Cerebrovascular disease, arteriosclerotic, post-stroke   Renal cell cancer (HCC)   CKD (chronic kidney disease) stage 3, GFR 30-59 ml/min (HCC)   Hip fracture (HCC)  Left hip Fracture Surgery planned tomorrow NPO after midnight  status post mechanical fall Continue pain management and bowel regimen Possible left hip repair on Sunday, June 2 Orthopedic surgery following  Acute on chronic hiccups Stopped baclofen Started thorazine tid  Paroxysmal A. fib on anticoagulation Eliquis Continue to hold anticoagulation due to planned surgery Rate controlled Heparin drip if okay with surgery  Bilateral conjunctivitis Start erythromycin ointment  Hypertension Blood pressure stable  AKI on CKD 3 Avoid nephrotoxic agents Monitor urine  output Repeat BMP in the morning  Chronic thrombocytopenia Platelets 115 Continue to monitor     Code Status: Full code  Family Communication: None at bedside  Disposition Plan: SNF post surgery   Consultants:  Orthopedic surgery  Procedures:  None  Antimicrobials:  None  DVT prophylaxis: Subcu heparin 3 times daily   Objective: Vitals:   09/06/17 0221 09/06/17 0646 09/06/17 0927 09/06/17 1319  BP: 130/80 140/78 118/76 111/70  Pulse: 79 87 85 89  Resp: 16 16 15 16   Temp: 98 F (36.7 C) 98.1 F (36.7 C) 99.2 F (37.3 C) 98.7 F (37.1 C)  TempSrc: Oral Oral Oral Oral  SpO2: 95% 96% 94% 95%  Weight:      Height:        Intake/Output Summary (Last 24 hours) at 09/06/2017 1348 Last data filed at 09/06/2017 1011 Gross per 24 hour  Intake 240 ml  Output 150 ml  Net 90 ml   Filed Weights   09/04/17 1230 09/04/17 1838  Weight: 89.4 kg (197 lb) 87.8 kg (193 lb 9 oz)    Exam:  . General: 82 y.o. year-old male WD wN NAD A&O x 3 . Cardiovascular: RRR no rubs or gallops. No JVD or thyromegaly.   [name] Respiratory: Clear to auscultation with no wheezes or rales. Good inspiratory effort. . Abdomen: Soft nontender nondistended with normal bowel sounds x4 quadrants. . Musculoskeletal: No lower extremity edema. 2/4 pulses in all 4 extremities. . Skin: No ulcerative lesions noted or rashes, . Psychiatry: Mood is appropriate for condition and setting   Data Reviewed: CBC: Recent Labs  Lab 09/04/17 1354 09/05/17 0608 09/06/17 0508  WBC 10.4 8.4 8.5  NEUTROABS 9.5*  --   --   HGB 15.0 13.4 13.3  HCT 42.6 39.6 40.0  MCV 95.7 98.3 97.3  PLT 132* 115* 92*   Basic Metabolic Panel: Recent Labs  Lab 09/04/17 1354 09/05/17 0608 09/06/17 0508  NA 138 141 138  K 4.1 4.3 4.0  CL 105 107 103  CO2 22 28 25   GLUCOSE 114* 117* 135*  BUN 29* 30* 34*  CREATININE 2.05* 2.10* 2.05*  CALCIUM 9.2 8.8* 8.9   GFR: Estimated Creatinine Clearance: 33 mL/min (A) (by C-G  formula based on SCr of 2.05 mg/dL (H)). Liver Function Tests: No results for input(s): AST, ALT, ALKPHOS, BILITOT, PROT, ALBUMIN in the last 168 hours. No results for input(s): LIPASE, AMYLASE in the last 168 hours. No results for input(s): AMMONIA in the last 168 hours. Coagulation Profile: Recent Labs  Lab 09/04/17 1354  INR 1.12   Cardiac Enzymes: No results for input(s): CKTOTAL, CKMB, CKMBINDEX, TROPONINI in the last 168 hours. BNP (last 3 results) No results for input(s): PROBNP in the last 8760 hours. HbA1C: No results for input(s): HGBA1C in the last 72 hours. CBG: Recent Labs  Lab 09/06/17 0724  GLUCAP 118*   Lipid Profile: No results for input(s): CHOL, HDL, LDLCALC, TRIG, CHOLHDL, LDLDIRECT in the last 72 hours. Thyroid Function Tests: No results for input(s): TSH, T4TOTAL, FREET4, T3FREE, THYROIDAB in the last 72 hours. Anemia Panel: No results for input(s): VITAMINB12, FOLATE, FERRITIN, TIBC, IRON, RETICCTPCT in the last 72 hours. Urine analysis:    Component Value Date/Time   COLORURINE YELLOW 09/03/2016 0831   APPEARANCEUR CLEAR 09/03/2016 0831   LABSPEC 1.015 09/03/2016 0831   PHURINE 6.0 09/03/2016 0831   GLUCOSEU NEGATIVE 09/03/2016 0831   HGBUR NEGATIVE 09/03/2016 0831   BILIRUBINUR NEGATIVE 09/03/2016 0831   KETONESUR NEGATIVE 09/03/2016 0831   PROTEINUR NEGATIVE 01/04/2012 0851   UROBILINOGEN 0.2 09/03/2016 0831   NITRITE NEGATIVE 09/03/2016 0831   LEUKOCYTESUR NEGATIVE 09/03/2016 0831   Sepsis Labs: @LABRCNTIP (procalcitonin:4,lacticidven:4)  )No results found for this or any previous visit (from the past 240 hour(s)).    Studies: No results found.  Scheduled Meds: . chlorproMAZINE  10 mg Oral TID  . erythromycin   Both Eyes BID  . feeding supplement (ENSURE ENLIVE)  237 mL Oral BID BM  . heparin injection (subcutaneous)  5,000 Units Subcutaneous Q8H  . senna  1 tablet Oral Daily  . tamsulosin  0.4 mg Oral QPC breakfast     Continuous Infusions:   LOS: 2 days     Kayleen Memos, MD [facility] Pager [phone]  If 7PM-7AM, please contact night-coverage [email or web address] Password [email or web address] 09/06/2017, 1:48 PM

## 2017-09-06 NOTE — Progress Notes (Signed)
    Subjective:    Patient reports pain as 0 on 0-10 scale. (laying in bed)  Denies CP or SOB.  Voiding without difficulty. Positive flatus. Objective: Vital signs in last 24 hours: Temp:  [98 F (36.7 C)-99.3 F (37.4 C)] 98.1 F (36.7 C) (06/01 0646) Pulse Rate:  [72-109] 87 (06/01 0646) Resp:  [16] 16 (06/01 0646) BP: (123-153)/(70-80) 140/78 (06/01 0646) SpO2:  [92 %-96 %] 96 % (06/01 0646)  Intake/Output from previous day: 05/31 0701 - 06/01 0700 In: 320 [P.O.:320] Out: 325 [Urine:325] Intake/Output this shift: No intake/output data recorded.  Labs: Recent Labs    09/04/17 1354 09/05/17 0608 09/06/17 0508  HGB 15.0 13.4 13.3   Recent Labs    09/05/17 0608 09/06/17 0508  WBC 8.4 8.5  RBC 4.03* 4.11*  HCT 39.6 40.0  PLT 115* 92*   Recent Labs    09/05/17 0608 09/06/17 0508  NA 141 138  K 4.3 4.0  CL 107 103  CO2 28 25  BUN 30* 34*  CREATININE 2.10* 2.05*  GLUCOSE 117* 135*  CALCIUM 8.8* 8.9   Recent Labs    09/04/17 1354  INR 1.12    Physical Exam: Neurologically intact ABD soft Sensation intact distally Dorsiflexion/Plantar flexion intact Compartment soft Body mass index is 25.54 kg/m.   Assessment/Plan:    NPO after midnight Surgery scheduled for tomorrow  MayoDarla Lesches for Dr. Melina Schools [facility] [phone] 09/06/2017, 7:53 AM

## 2017-09-07 ENCOUNTER — Encounter (HOSPITAL_COMMUNITY)
Admission: EM | Disposition: A | Discharge status: Home Health | Payer: Self-pay | Source: Home / Self Care | Attending: Internal Medicine

## 2017-09-07 ENCOUNTER — Inpatient Hospital Stay (HOSPITAL_COMMUNITY): Payer: PPO

## 2017-09-07 ENCOUNTER — Inpatient Hospital Stay (HOSPITAL_COMMUNITY): Payer: PPO | Admitting: Certified Registered Nurse Anesthetist

## 2017-09-07 ENCOUNTER — Encounter (HOSPITAL_COMMUNITY): Payer: Self-pay | Admitting: Certified Registered Nurse Anesthetist

## 2017-09-07 DIAGNOSIS — S72002A Fracture of unspecified part of neck of left femur, initial encounter for closed fracture: Secondary | ICD-10-CM | POA: Diagnosis present

## 2017-09-07 HISTORY — PX: TOTAL HIP ARTHROPLASTY: SHX124

## 2017-09-07 LAB — BASIC METABOLIC PANEL
Anion gap: 11 (ref 5–15)
BUN: 39 mg/dL — ABNORMAL HIGH (ref 6–20)
CO2: 23 mmol/L (ref 22–32)
Calcium: 8.8 mg/dL — ABNORMAL LOW (ref 8.9–10.3)
Chloride: 103 mmol/L (ref 101–111)
Creatinine, Ser: 2.03 mg/dL — ABNORMAL HIGH (ref 0.61–1.24)
GFR calc Af Amer: 34 mL/min — ABNORMAL LOW (ref >60)
GFR calc non Af Amer: 29 mL/min — ABNORMAL LOW (ref >60)
Glucose, Bld: 120 mg/dL — ABNORMAL HIGH (ref 65–99)
Potassium: 4.2 mmol/L (ref 3.5–5.1)
Sodium: 137 mmol/L (ref 135–145)

## 2017-09-07 LAB — CBC
HCT: 39.2 % (ref 39.0–52.0)
Hemoglobin: 13 g/dL (ref 13.0–17.0)
MCH: 32 pg (ref 26.0–34.0)
MCHC: 33.2 g/dL (ref 30.0–36.0)
MCV: 96.6 fL (ref 78.0–100.0)
Platelets: 97 10*3/uL — ABNORMAL LOW (ref 150–400)
RBC: 4.06 MIL/uL — ABNORMAL LOW (ref 4.22–5.81)
RDW: 13 % (ref 11.5–15.5)
WBC: 5.9 10*3/uL (ref 4.0–10.5)

## 2017-09-07 LAB — MRSA PCR SCREENING: MRSA by PCR: NEGATIVE

## 2017-09-07 SURGERY — ARTHROPLASTY, HIP, TOTAL, ANTERIOR APPROACH
Anesthesia: General | Site: Hip | Laterality: Left

## 2017-09-07 MED ORDER — BUPIVACAINE-EPINEPHRINE 0.25% -1:200000 IJ SOLN
INTRAMUSCULAR | Status: DC | PRN
  Administered 2017-09-07: 30 mL

## 2017-09-07 MED ORDER — FENTANYL CITRATE (PF) 250 MCG/5ML IJ SOLN
INTRAMUSCULAR | Status: AC
  Filled 2017-09-07: qty 5

## 2017-09-07 MED ORDER — METOPROLOL TARTRATE 5 MG/5ML IV SOLN
INTRAVENOUS | Status: DC | PRN
  Administered 2017-09-07: 2 mg via INTRAVENOUS
  Administered 2017-09-07: 1 mg via INTRAVENOUS

## 2017-09-07 MED ORDER — FENTANYL CITRATE (PF) 100 MCG/2ML IJ SOLN
INTRAMUSCULAR | Status: AC
  Filled 2017-09-07: qty 2

## 2017-09-07 MED ORDER — ONDANSETRON HCL 4 MG/2ML IJ SOLN: 4.0000 mg | Freq: Four times a day (QID) | INTRAMUSCULAR | Status: DC | PRN

## 2017-09-07 MED ORDER — LACTATED RINGERS IV SOLN
INTRAVENOUS | Status: DC
  Administered 2017-09-07 (×4): via INTRAVENOUS

## 2017-09-07 MED ORDER — PHENYLEPHRINE 40 MCG/ML (10ML) SYRINGE FOR IV PUSH (FOR BLOOD PRESSURE SUPPORT)
PREFILLED_SYRINGE | INTRAVENOUS | Status: AC
  Filled 2017-09-07: qty 10

## 2017-09-07 MED ORDER — METOCLOPRAMIDE HCL 5 MG PO TABS
5.0000 mg | ORAL_TABLET | Freq: Three times a day (TID) | ORAL | Status: DC | PRN
  Filled 2017-09-07: qty 2

## 2017-09-07 MED ORDER — SODIUM CHLORIDE 0.9 % IJ SOLN
INTRAMUSCULAR | Status: AC
  Filled 2017-09-07: qty 10

## 2017-09-07 MED ORDER — PHENYLEPHRINE HCL 10 MG/ML IJ SOLN
INTRAMUSCULAR | Status: DC | PRN
  Administered 2017-09-07 (×2): 120 ug via INTRAVENOUS
  Administered 2017-09-07: 80 ug via INTRAVENOUS
  Administered 2017-09-07 (×2): 120 ug via INTRAVENOUS

## 2017-09-07 MED ORDER — ONDANSETRON HCL 4 MG PO TABS
4.0000 mg | ORAL_TABLET | Freq: Four times a day (QID) | ORAL | Status: DC | PRN
  Filled 2017-09-07: qty 1

## 2017-09-07 MED ORDER — PROPOFOL 500 MG/50ML IV EMUL
INTRAVENOUS | Status: DC | PRN
  Administered 2017-09-07: 140 mg via INTRAVENOUS
  Administered 2017-09-07: 50 mg via INTRAVENOUS

## 2017-09-07 MED ORDER — SUGAMMADEX SODIUM 200 MG/2ML IV SOLN
INTRAVENOUS | Status: AC
  Filled 2017-09-07: qty 2

## 2017-09-07 MED ORDER — ROCURONIUM BROMIDE 10 MG/ML (PF) SYRINGE
PREFILLED_SYRINGE | INTRAVENOUS | Status: DC | PRN
  Administered 2017-09-07: 10 mg via INTRAVENOUS
  Administered 2017-09-07: 50 mg via INTRAVENOUS
  Administered 2017-09-07: 20 mg via INTRAVENOUS
  Administered 2017-09-07: 10 mg via INTRAVENOUS

## 2017-09-07 MED ORDER — EPHEDRINE SULFATE 50 MG/ML IJ SOLN
INTRAMUSCULAR | Status: DC | PRN
  Administered 2017-09-07 (×2): 10 mg via INTRAVENOUS

## 2017-09-07 MED ORDER — KETOROLAC TROMETHAMINE 30 MG/ML IJ SOLN
INTRAMUSCULAR | Status: DC | PRN
  Administered 2017-09-07: 30 mg via INTRAMUSCULAR

## 2017-09-07 MED ORDER — SODIUM CHLORIDE 0.9 % IR SOLN
Status: DC | PRN
  Administered 2017-09-07: 1000 mL
  Administered 2017-09-07: 3000 mL

## 2017-09-07 MED ORDER — ONDANSETRON HCL 4 MG/2ML IJ SOLN
INTRAMUSCULAR | Status: DC | PRN
Start: 2017-09-07 — End: 2017-09-07
  Administered 2017-09-07: 4 mg via INTRAVENOUS

## 2017-09-07 MED ORDER — CEFAZOLIN SODIUM-DEXTROSE 2-4 GM/100ML-% IV SOLN
2.0000 g | Freq: Four times a day (QID) | INTRAVENOUS | Status: AC
  Administered 2017-09-07 (×2): 2 g via INTRAVENOUS
  Filled 2017-09-07 (×2): qty 100

## 2017-09-07 MED ORDER — FENTANYL CITRATE (PF) 100 MCG/2ML IJ SOLN: 25.0000 ug | INTRAMUSCULAR | Status: DC | PRN

## 2017-09-07 MED ORDER — METOCLOPRAMIDE HCL 5 MG/ML IJ SOLN: 5.0000 mg | Freq: Three times a day (TID) | INTRAMUSCULAR | Status: DC | PRN

## 2017-09-07 MED ORDER — 0.9 % SODIUM CHLORIDE (POUR BTL) OPTIME
TOPICAL | Status: DC | PRN
  Administered 2017-09-07: 1000 mL

## 2017-09-07 MED ORDER — ALBUMIN HUMAN 5 % IV SOLN
INTRAVENOUS | Status: AC
  Filled 2017-09-07: qty 250

## 2017-09-07 MED ORDER — KETOROLAC TROMETHAMINE 30 MG/ML IJ SOLN
INTRAMUSCULAR | Status: AC
  Filled 2017-09-07: qty 1

## 2017-09-07 MED ORDER — ONDANSETRON HCL 4 MG/2ML IJ SOLN
INTRAMUSCULAR | Status: AC
  Filled 2017-09-07: qty 2

## 2017-09-07 MED ORDER — ISOPROPYL ALCOHOL 70 % SOLN
Status: AC
  Filled 2017-09-07: qty 480

## 2017-09-07 MED ORDER — SODIUM CHLORIDE 0.9 % IJ SOLN
INTRAMUSCULAR | Status: DC | PRN
  Administered 2017-09-07: 30 mL

## 2017-09-07 MED ORDER — MENTHOL 3 MG MT LOZG
1.0000 | LOZENGE | OROMUCOSAL | Status: DC | PRN
  Filled 2017-09-07: qty 9

## 2017-09-07 MED ORDER — PHENOL 1.4 % MT LIQD: 1.0000 | OROMUCOSAL | Status: DC | PRN

## 2017-09-07 MED ORDER — DEXAMETHASONE SODIUM PHOSPHATE 10 MG/ML IJ SOLN
INTRAMUSCULAR | Status: DC | PRN
  Administered 2017-09-07: 10 mg via INTRAVENOUS

## 2017-09-07 MED ORDER — LIDOCAINE 2% (20 MG/ML) 5 ML SYRINGE
INTRAMUSCULAR | Status: DC | PRN
  Administered 2017-09-07: 60 mg via INTRAVENOUS

## 2017-09-07 MED ORDER — ISOPROPYL ALCOHOL 70 % SOLN
Status: DC | PRN
  Administered 2017-09-07: 1 via TOPICAL

## 2017-09-07 MED ORDER — BUPIVACAINE-EPINEPHRINE (PF) 0.25% -1:200000 IJ SOLN
INTRAMUSCULAR | Status: AC
  Filled 2017-09-07: qty 30

## 2017-09-07 MED ORDER — OXYCODONE HCL 5 MG PO TABS: 5.0000 mg | ORAL_TABLET | Freq: Once | ORAL | Status: DC | PRN

## 2017-09-07 MED ORDER — EPHEDRINE SULFATE 50 MG/ML IJ SOLN
INTRAMUSCULAR | Status: AC
  Filled 2017-09-07: qty 1

## 2017-09-07 MED ORDER — PROPOFOL 10 MG/ML IV BOLUS
INTRAVENOUS | Status: AC
  Filled 2017-09-07: qty 60

## 2017-09-07 MED ORDER — WATER FOR IRRIGATION, STERILE IR SOLN
Status: DC | PRN
  Administered 2017-09-07: 1000 mL

## 2017-09-07 MED ORDER — FENTANYL CITRATE (PF) 100 MCG/2ML IJ SOLN
INTRAMUSCULAR | Status: DC | PRN
  Administered 2017-09-07 (×3): 50 ug via INTRAVENOUS
  Administered 2017-09-07: 100 ug via INTRAVENOUS
  Administered 2017-09-07 (×3): 50 ug via INTRAVENOUS

## 2017-09-07 MED ORDER — APIXABAN 2.5 MG PO TABS
2.5000 mg | ORAL_TABLET | Freq: Two times a day (BID) | ORAL | Status: AC
  Administered 2017-09-08 – 2017-09-09 (×4): 2.5 mg via ORAL
  Filled 2017-09-07 (×5): qty 1

## 2017-09-07 MED ORDER — ACETAMINOPHEN 10 MG/ML IV SOLN
INTRAVENOUS | Status: AC
  Filled 2017-09-07: qty 100

## 2017-09-07 MED ORDER — ALBUMIN HUMAN 5 % IV SOLN
INTRAVENOUS | Status: DC | PRN
  Administered 2017-09-07: 09:00:00 via INTRAVENOUS

## 2017-09-07 MED ORDER — DOCUSATE SODIUM 100 MG PO CAPS
100.0000 mg | ORAL_CAPSULE | Freq: Two times a day (BID) | ORAL | Status: DC
  Administered 2017-09-07 – 2017-09-08 (×3): 100 mg via ORAL
  Filled 2017-09-07 (×4): qty 1

## 2017-09-07 MED ORDER — SUGAMMADEX SODIUM 200 MG/2ML IV SOLN
INTRAVENOUS | Status: DC | PRN
  Administered 2017-09-07: 200 mg via INTRAVENOUS

## 2017-09-07 MED ORDER — CEFAZOLIN SODIUM-DEXTROSE 2-4 GM/100ML-% IV SOLN
INTRAVENOUS | Status: AC
  Filled 2017-09-07: qty 100

## 2017-09-07 MED ORDER — SODIUM CHLORIDE 0.9 % IJ SOLN
INTRAMUSCULAR | Status: AC
  Filled 2017-09-07: qty 50

## 2017-09-07 MED ORDER — DEXAMETHASONE SODIUM PHOSPHATE 10 MG/ML IJ SOLN
INTRAMUSCULAR | Status: AC
  Filled 2017-09-07: qty 1

## 2017-09-07 MED ORDER — METOPROLOL TARTRATE 5 MG/5ML IV SOLN
INTRAVENOUS | Status: AC
  Filled 2017-09-07: qty 5

## 2017-09-07 MED ORDER — PHENYLEPHRINE 40 MCG/ML (10ML) SYRINGE FOR IV PUSH (FOR BLOOD PRESSURE SUPPORT)
PREFILLED_SYRINGE | INTRAVENOUS | Status: DC | PRN
  Administered 2017-09-07: 160 ug via INTRAVENOUS
  Administered 2017-09-07 (×2): 120 ug via INTRAVENOUS
  Administered 2017-09-07: 160 ug via INTRAVENOUS
  Administered 2017-09-07: 120 ug via INTRAVENOUS

## 2017-09-07 MED ORDER — OXYCODONE HCL 5 MG/5ML PO SOLN
5.0000 mg | Freq: Once | ORAL | Status: DC | PRN
  Filled 2017-09-07: qty 5

## 2017-09-07 SURGICAL SUPPLY — 47 items
ADH SKN CLS APL DERMABOND .7 (GAUZE/BANDAGES/DRESSINGS) ×2
BAG DECANTER FOR FLEXI CONT (MISCELLANEOUS) IMPLANT
BAG SPEC THK2 15X12 ZIP CLS (MISCELLANEOUS)
BAG ZIPLOCK 12X15 (MISCELLANEOUS) IMPLANT
CAPT HIP TOTAL 2 ×2 IMPLANT
CHLORAPREP W/TINT 26ML (MISCELLANEOUS) ×3 IMPLANT
CLOTH BEACON ORANGE TIMEOUT ST (SAFETY) ×3 IMPLANT
COVER PERINEAL POST (MISCELLANEOUS) ×3 IMPLANT
COVER SURGICAL LIGHT HANDLE (MISCELLANEOUS) ×3 IMPLANT
DECANTER SPIKE VIAL GLASS SM (MISCELLANEOUS) ×3 IMPLANT
DERMABOND ADVANCED (GAUZE/BANDAGES/DRESSINGS) ×4
DERMABOND ADVANCED .7 DNX12 (GAUZE/BANDAGES/DRESSINGS) ×2 IMPLANT
DRAPE SHEET LG 3/4 BI-LAMINATE (DRAPES) ×9 IMPLANT
DRAPE STERI IOBAN 125X83 (DRAPES) ×3 IMPLANT
DRAPE U-SHAPE 47X51 STRL (DRAPES) ×8 IMPLANT
DRESSING AQUACEL AG SP 3.5X10 (GAUZE/BANDAGES/DRESSINGS) IMPLANT
DRSG AQUACEL AG ADV 3.5X10 (GAUZE/BANDAGES/DRESSINGS) ×3 IMPLANT
DRSG AQUACEL AG SP 3.5X10 (GAUZE/BANDAGES/DRESSINGS) ×3
ELECT PENCIL ROCKER SW 15FT (MISCELLANEOUS) ×3 IMPLANT
ELECT REM PT RETURN 15FT ADLT (MISCELLANEOUS) ×3 IMPLANT
GAUZE SPONGE 4X4 12PLY STRL (GAUZE/BANDAGES/DRESSINGS) ×3 IMPLANT
GLOVE BIO SURGEON STRL SZ8.5 (GLOVE) ×6 IMPLANT
GLOVE BIOGEL PI IND STRL 8.5 (GLOVE) ×1 IMPLANT
GLOVE BIOGEL PI INDICATOR 8.5 (GLOVE) ×2
GOWN SPEC L3 XXLG W/TWL (GOWN DISPOSABLE) ×3 IMPLANT
HANDPIECE INTERPULSE COAX TIP (DISPOSABLE) ×3
HOLDER FOLEY CATH W/STRAP (MISCELLANEOUS) ×1 IMPLANT
HOOD PEEL AWAY FLYTE STAYCOOL (MISCELLANEOUS) ×10 IMPLANT
MARKER SKIN DUAL TIP RULER LAB (MISCELLANEOUS) ×5 IMPLANT
NDL SPNL 18GX3.5 QUINCKE PK (NEEDLE) ×1 IMPLANT
NEEDLE SPNL 18GX3.5 QUINCKE PK (NEEDLE) ×3 IMPLANT
PACK ANTERIOR HIP CUSTOM (KITS) ×3 IMPLANT
SAW OSC TIP CART 19.5X105X1.3 (SAW) ×3 IMPLANT
SEALER BIPOLAR AQUA 6.0 (INSTRUMENTS) ×3 IMPLANT
SET HNDPC FAN SPRY TIP SCT (DISPOSABLE) ×1 IMPLANT
SUT ETHIBOND NAB CT1 #1 30IN (SUTURE) ×6 IMPLANT
SUT MNCRL AB 3-0 PS2 18 (SUTURE) ×3 IMPLANT
SUT MON AB 2-0 CT1 36 (SUTURE) ×6 IMPLANT
SUT STRATAFIX PDO 1 14 VIOLET (SUTURE) ×3
SUT STRATFX PDO 1 14 VIOLET (SUTURE) ×1
SUT VIC AB 2-0 CT1 27 (SUTURE) ×3
SUT VIC AB 2-0 CT1 TAPERPNT 27 (SUTURE) ×1 IMPLANT
SUTURE STRATFX PDO 1 14 VIOLET (SUTURE) ×1 IMPLANT
SYR 50ML LL SCALE MARK (SYRINGE) ×3 IMPLANT
TRAY FOLEY MTR SLVR 16FR STAT (SET/KITS/TRAYS/PACK) IMPLANT
WATER STERILE IRR 1000ML POUR (IV SOLUTION) ×3 IMPLANT
YANKAUER SUCT BULB TIP 10FT TU (MISCELLANEOUS) ×3 IMPLANT

## 2017-09-07 NOTE — Progress Notes (Signed)
PROGRESS NOTE  Jeffrey Hardy:[id number] DOB: 1935/11/25 DOA: 09/04/2017 PCP: Biagio Borg, MD  HPI/Recap of past 24 hours: Jeffrey Hardy is a 82 y.o. male with history of atrial fibrillation on Eliquis, hypertension, chronic kidney disease stage III, history of renal cell carcinoma status post right-sided nephrectomy had a fall at home while walking in his barn.  Patient fell onto his hip and since it was hurting came to the ER.  Denies hitting his head or losing consciousness denies any palpitation chest pain or shortness of breath.  Had taken his apixaban this morning.  ED Course: In the ER x-rays revealed left hip fracture.  On-call orthopedic surgeon was consulted.  Since patient has had apixaban today surgery is planned to be done on Sunday 2 days from now.  09/05/2017: Patient seen and examined at bedside.  Has no new complaints.  Surgery planned on Sunday, June 2.  09/06/17: B/l  Eye tan discharge suspect conjunctivitis. erythromyjcin ointment ordered. No other complaints.  09/07/17: POD#0 post left hip repair. Denies pain or dyspnea. Has no new complaints.  Assessment/Plan: Principal Problem:   Closed left hip fracture, initial encounter [facility]) Active Problems:   Essential hypertension   Thrombocytopenia (HCC)   Cerebrovascular disease, arteriosclerotic, post-stroke   Renal cell cancer (HCC)   CKD (chronic kidney disease) stage 3, GFR 30-59 ml/min (HCC)   Hip fracture (HCC)   Closed displaced fracture of left femoral neck (HCC)  Left hip Fracture POD#0 post repair Pain management and bowel regimen in place Defer to ortho surgery to restart anticoagulation  Hiccups, resolved Hx of hiccups on baclofen Stopped baclofen and started thorazine tid Now resolved  P. afib on eliquis Defer to orthopedic surgery to restart eliquis  B/L conjunctivitis, resolving C/w erythromycin ointment x 2 days  HTN, stable C/w home meds  AKI on CKD 3, imrpoving Avoid nephrotoxic  agents Monitor urine output Repeat BMP in the morning  Chronic thrombocytopenia Platelets 115 Continue to monitor     Code Status: Full code  Family Communication: None at bedside  Disposition Plan: SNF post surgery   Consultants:  Orthopedic surgery  Procedures: Left hip repair  Antimicrobials:  None  DVT prophylaxis: SCds   Objective: Vitals:   09/07/17 1137 09/07/17 1236 09/07/17 1330 09/07/17 1439  BP: 114/67 101/64 110/66 96/69  Pulse: 72 69 66 72  Resp: 18 16 16 14   Temp: 98.2 F (36.8 C)  98 F (36.7 C) 97.6 F (36.4 C)  TempSrc:    Oral  SpO2: 100% 99% 100% 99%  Weight:      Height:        Intake/Output Summary (Last 24 hours) at 09/07/2017 1748 Last data filed at 09/07/2017 1740 Gross per 24 hour  Intake 2750 ml  Output 750 ml  Net 2000 ml   Filed Weights   09/04/17 1230 09/04/17 1838  Weight: 89.4 kg (197 lb) 87.8 kg (193 lb 9 oz)    Exam:  . General: 82 y.o. year-old male WD WN NAD A&O x 3 . Cardiovascular: RRR no rubs or gallops. No JVD or thyromegaly..   . Respiratory: Clear to auscultation with no wheezes or rales. Good inspiratory effort. . Abdomen: Soft nontender nondistended with normal bowel sounds x4 quadrants. . Musculoskeletal: Trace edema in LE bilaterally. . Skin: No ulcerative lesions noted or rashes, . Psychiatry: Mood is appropriate for condition and setting   Data Reviewed: CBC: Recent Labs  Lab 09/04/17 1354 09/05/17 0608 09/06/17 0508 09/07/17  0518  WBC 10.4 8.4 8.5 5.9  NEUTROABS 9.5*  --   --   --   HGB 15.0 13.4 13.3 13.0  HCT 42.6 39.6 40.0 39.2  MCV 95.7 98.3 97.3 96.6  PLT 132* 115* 92* 97*   Basic Metabolic Panel: Recent Labs  Lab 09/04/17 1354 09/05/17 0608 09/06/17 0508 09/07/17 0518  NA 138 141 138 137  K 4.1 4.3 4.0 4.2  CL 105 107 103 103  CO2 22 28 25 23   GLUCOSE 114* 117* 135* 120*  BUN 29* 30* 34* 39*  CREATININE 2.05* 2.10* 2.05* 2.03*  CALCIUM 9.2 8.8* 8.9 8.8*    GFR: Estimated Creatinine Clearance: 33.3 mL/min (A) (by C-G formula based on SCr of 2.03 mg/dL (H)). Liver Function Tests: No results for input(s): AST, ALT, ALKPHOS, BILITOT, PROT, ALBUMIN in the last 168 hours. No results for input(s): LIPASE, AMYLASE in the last 168 hours. No results for input(s): AMMONIA in the last 168 hours. Coagulation Profile: Recent Labs  Lab 09/04/17 1354  INR 1.12   Cardiac Enzymes: No results for input(s): CKTOTAL, CKMB, CKMBINDEX, TROPONINI in the last 168 hours. BNP (last 3 results) No results for input(s): PROBNP in the last 8760 hours. HbA1C: No results for input(s): HGBA1C in the last 72 hours. CBG: Recent Labs  Lab 09/06/17 0724  GLUCAP 118*   Lipid Profile: No results for input(s): CHOL, HDL, LDLCALC, TRIG, CHOLHDL, LDLDIRECT in the last 72 hours. Thyroid Function Tests: No results for input(s): TSH, T4TOTAL, FREET4, T3FREE, THYROIDAB in the last 72 hours. Anemia Panel: No results for input(s): VITAMINB12, FOLATE, FERRITIN, TIBC, IRON, RETICCTPCT in the last 72 hours. Urine analysis:    Component Value Date/Time   COLORURINE YELLOW 09/03/2016 0831   APPEARANCEUR CLEAR 09/03/2016 0831   LABSPEC 1.015 09/03/2016 0831   PHURINE 6.0 09/03/2016 0831   GLUCOSEU NEGATIVE 09/03/2016 0831   HGBUR NEGATIVE 09/03/2016 0831   BILIRUBINUR NEGATIVE 09/03/2016 0831   KETONESUR NEGATIVE 09/03/2016 0831   PROTEINUR NEGATIVE 01/04/2012 0851   UROBILINOGEN 0.2 09/03/2016 0831   NITRITE NEGATIVE 09/03/2016 0831   LEUKOCYTESUR NEGATIVE 09/03/2016 0831   Sepsis Labs: @LABRCNTIP (procalcitonin:4,lacticidven:4)  ) Recent Results (from the past 240 hour(s))  MRSA PCR Screening     Status: None   Collection Time: 09/06/17 10:00 AM  Result Value Ref Range Status   MRSA by PCR NEGATIVE NEGATIVE Final    Comment:        The GeneXpert MRSA Assay (FDA approved for NASAL specimens only), is one component of a comprehensive MRSA  colonization surveillance program. It is not intended to diagnose MRSA infection nor to guide or monitor treatment for MRSA infections. Performed at [facility], [place] [address], [place], [place] [address]       Studies: Pelvis Portable  Result Date: 09/07/2017 CLINICAL DATA:  Status post left total hip replacement today. EXAM: PORTABLE PELVIS 1-2 VIEWS COMPARISON:  Intraoperative imaging earlier today. FINDINGS: Left total hip arthroplasty is in place. The device is located. No fracture or other acute abnormality. Soft tissue gas surgery noted. IMPRESSION: Status post left hip replacement.  No acute finding. Electronically Signed   By: Inge Rise M.D.   On: 09/07/2017 10:50   Dg C-arm 1-60 Min-no Report  Result Date: 09/07/2017 Fluoroscopy was utilized by the requesting physician.  No radiographic interpretation.   Dg Hip Operative Unilat W Or W/o Pelvis Left  Result Date: 09/07/2017 CLINICAL DATA:  Total hip replacement FLUOROSCOPY TIME:  22 seconds. Images:  4 EXAM: OPERATIVE LEFT HIP (WITH PELVIS IF PERFORMED) 4 VIEWS TECHNIQUE: Fluoroscopic spot image(s) were submitted for interpretation post-operatively. COMPARISON:  None. FINDINGS: The patient is status post left hip replacement. Hardware is in good position by the end of the study. IMPRESSION: Left hip replacement as above. Electronically Signed   By: Dorise Bullion III M.D   On: 09/07/2017 09:57    Scheduled Meds: . Derrill Memo ON 09/08/2017] apixaban  2.5 mg Oral BID  . chlorproMAZINE  10 mg Oral TID  . docusate sodium  100 mg Oral BID  . erythromycin   Both Eyes BID  . feeding supplement (ENSURE ENLIVE)  237 mL Oral BID BM  . senna  1 tablet Oral Daily  . tamsulosin  0.4 mg Oral QPC breakfast    Continuous Infusions: .  ceFAZolin (ANCEF) IV Stopped (09/07/17 1613)     LOS: 3 days     Kayleen Memos, MD [facility] Pager [phone]  If 7PM-7AM, please contact  night-coverage [email or web address] Password [facility] 09/07/2017, 5:48 PM

## 2017-09-07 NOTE — Transfer of Care (Signed)
Immediate Anesthesia Transfer of Care Note  Patient: Jeffrey Hardy  Procedure(s) Performed: TOTAL HIP ARTHROPLASTY ANTERIOR APPROACH (Left Hip)  Patient Location: PACU  Anesthesia Type:General  Level of Consciousness: sedated, patient cooperative and responds to stimulation  Airway & Oxygen Therapy: Patient Spontanous Breathing and Patient connected to face mask oxygen  Post-op Assessment: Report given to RN and Post -op Vital signs reviewed and stable  Post vital signs: Reviewed and stable  Last Vitals:  Vitals Value Taken Time  BP 95/64 09/07/2017 10:30 AM  Temp    Pulse 100 09/07/2017 10:30 AM  Resp 16 09/07/2017 10:30 AM  SpO2 100 % 09/07/2017 10:30 AM  Vitals shown include unvalidated device data.  Last Pain:  Vitals:   09/07/17 0606  TempSrc: Oral  PainSc:       Patients Stated Pain Goal: 2 (83/81/84 0375)  Complications: No apparent anesthesia complications

## 2017-09-07 NOTE — Op Note (Signed)
OPERATIVE REPORT  SURGEON: Rod Can, MD   ASSISTANT: Dierdre Highman, PA-C.  PREOPERATIVE DIAGNOSIS: Displaced Left femoral fracture.   POSTOPERATIVE DIAGNOSIS: Displaced Left femoral fracture.   PROCEDURE: Left total hip arthroplasty, anterior approach.   IMPLANTS: DePuy Tri Lock stem, size 9, std offset. DePuy Pinnacle Cup, size 60 mm. DePuy Altrx liner, size 36 by 60 mm, +4 neutral. DePuy metal head ball, size 36 + 5 mm. 6.5 x 35 mm cancellous bone screw x1.  ANESTHESIA:  General  ESTIMATED BLOOD LOSS: 350 mL    ANTIBIOTICS: 2 g Ancef.  DRAINS: None.  COMPLICATIONS: None.   CONDITION: PACU - hemodynamically stable.   BRIEF CLINICAL NOTE: Jeffrey Hardy is a 82 y.o. male with a displaced Left femoral neck fracture.  The patient was indicated for total hip arthroplasty.  Patient does take chronic Eliquis.  We allowed 72 hours to elapse before proceeding with surgery. The risks, benefits, and alternatives to the procedure were explained, and the patient elected to proceed.  PROCEDURE IN DETAIL: Surgical site was marked by myself in the pre-op holding area. Once inside the operating room, spinal anesthesia was obtained, and a foley catheter was inserted. The patient was then positioned on the Hana table. All bony prominences were well padded. The hip was prepped and draped in the normal sterile surgical fashion. A time-out was called verifying side and site of surgery. The patient received IV antibiotics within 60 minutes of beginning the procedure.  The direct anterior approach to the hip was performed through the Hueter interval. Lateral femoral circumflex vessels were treated with the Auqumantys. The anterior capsule was exposed and an inverted T capsulotomy was made. The fracture hematoma was encountered and evacuated.  There was a comminuted subcapital femoral neck fracture.  The femoral neck cut was made to the level of the templated cut. A corkscrew was  placed into the head and the head was removed.  The head was passed to the back table and was measured.  Acetabular exposure was achieved, and the pulvinar and labrum were excised. Sequential reaming of the acetabulum was then performed up to a size 59 mm reamer. A 60 mm cup was then opened and impacted into place at approximately 40 degrees of abduction and 20 degrees of anteversion.  I elected to augment the already acceptable press-fit fixation with a single 6.5 mm cancellus bone screw.  The final polyethylene liner was impacted into place and acetabular osteophytes were removed.   I then gained femoral exposure taking care to protect the abductors and greater trochanter. This was performed using standard external rotation, extension, and adduction. The capsule was peeled off the inner aspect of the greater trochanter, taking care to preserve the short external rotators. A cookie cutter was used to enter the femoral canal, and then the femoral canal finder was placed. Sequential broaching was performed up to a size 9. Calcar planer was used on the femoral neck remnant. I placed a std offset neck and a trial head ball. The hip was reduced. Leg lengths and offset were checked fluoroscopically. The hip was dislocated and trial components were removed. The final implants were placed, and the hip was reduced.  Fluoroscopy was used to confirm component position and leg lengths. At 90 degrees of external rotation and full extension, the hip was stable to an anterior directed force.  The wound was copiously irrigated with normal saline using pulse lavage. Marcaine solution was injected into the periarticular soft tissue. The wound was closed  in layers using #1 Vicryl and V-Loc for the fascia, 2-0 Vicryl for the subcutaneous fat, 2-0 Monocryl for the deep dermal layer, 3-0 running Monocryl subcuticular stitch, and Dermabond for the skin. Once the glue was fully dried, an Aquacell Ag dressing was  applied. The patient was transported to the recovery room in stable condition. Sponge, needle, and instrument counts were correct at the end of the case x2. The patient tolerated the procedure well and there were no known complications.  Please note that a surgical assistant was a medical necessity for this procedure to perform it in a safe and expeditious manner. Assistant was necessary to provide appropriate retraction of vital neurovascular structures, to prevent femoral fracture, and to allow for anatomic placement of the prosthesis.  Postoperatively, the patient will be readmitted to the hospitalist service.  He may weight-bear as tolerated with a walker.  Beginning tomorrow, Eliquis can be resumed at 2.5 mg p.o. twice daily.  After 72 hours, resume normal home dose.  He will work with physical therapy while in house.  We will plan for discharge home with home health physical therapy.  He will return to the office for routine 2-week postoperative care.

## 2017-09-07 NOTE — Discharge Instructions (Signed)
°Dr. Rori Goar °Joint Replacement Specialist °[facility] °[address] °[place], [place] [address] °[phone] ° ° °TOTAL HIP REPLACEMENT POSTOPERATIVE DIRECTIONS ° ° ° °Hip Rehabilitation, Guidelines Following Surgery  ° °WEIGHT BEARING °Weight bearing as tolerated with assist device (walker, cane, etc) as directed, use it as long as suggested by your surgeon or therapist, typically at least 4-6 weeks. ° °The results of a hip operation are greatly improved after range of motion and muscle strengthening exercises. Follow all safety measures which are given to protect your hip. If any of these exercises cause increased pain or swelling in your joint, decrease the amount until you are comfortable again. Then slowly increase the exercises. Call your caregiver if you have problems or questions.  ° °HOME CARE INSTRUCTIONS  °Most of the following instructions are designed to prevent the dislocation of your new hip.  °Remove items at home which could result in a fall. This includes throw rugs or furniture in walking pathways.  °Continue medications as instructed at time of discharge. °· You may have some home medications which will be placed on hold until you complete the course of blood thinner medication. °· You may start showering once you are discharged home. Do not remove your dressing. °Do not put on socks or shoes without following the instructions of your caregivers.   °Sit on chairs with arms. Use the chair arms to help push yourself up when arising.  °Arrange for the use of a toilet seat elevator so you are not sitting low.  °· Walk with walker as instructed.  °You may resume a sexual relationship in one month or when given the OK by your caregiver.  °Use walker as long as suggested by your caregivers.  °You may put full weight on your legs and walk as much as is comfortable. °Avoid periods of inactivity such as sitting longer than an hour when not asleep. This helps prevent  blood clots.  °You may return to work once you are cleared by your surgeon.  °Do not drive a car for 6 weeks or until released by your surgeon.  °Do not drive while taking narcotics.  °Wear elastic stockings for two weeks following surgery during the day but you may remove then at night.  °Make sure you keep all of your appointments after your operation with all of your doctors and caregivers. You should call the office at the above phone number and make an appointment for approximately two weeks after the date of your surgery. °Please pick up a stool softener and laxative for home use as long as you are requiring pain medications. °· ICE to the affected hip every three hours for 30 minutes at a time and then as needed for pain and swelling. Continue to use ice on the hip for pain and swelling from surgery. You may notice swelling that will progress down to the foot and ankle.  This is normal after surgery.  Elevate the leg when you are not up walking on it.   °It is important for you to complete the blood thinner medication as prescribed by your doctor. °· Continue to use the breathing machine which will help keep your temperature down.  It is common for your temperature to cycle up and down following surgery, especially at night when you are not up moving around and exerting yourself.  The breathing machine keeps your lungs expanded and your temperature down. ° °RANGE OF MOTION AND STRENGTHENING EXERCISES  °These exercises are   designed to help you keep full movement of your hip joint. Follow your caregiver's or physical therapist's instructions. Perform all exercises about fifteen times, three times per day or as directed. Exercise both hips, even if you have had only one joint replacement. These exercises can be done on a training (exercise) mat, on the floor, on a table or on a bed. Use whatever works the best and is most comfortable for you. Use music or television while you are exercising so that the exercises  are a pleasant break in your day. This will make your life better with the exercises acting as a break in routine you can look forward to.  °Lying on your back, slowly slide your foot toward your buttocks, raising your knee up off the floor. Then slowly slide your foot back down until your leg is straight again.  °Lying on your back spread your legs as far apart as you can without causing discomfort.  °Lying on your side, raise your upper leg and foot straight up from the floor as far as is comfortable. Slowly lower the leg and repeat.  °Lying on your back, tighten up the muscle in the front of your thigh (quadriceps muscles). You can do this by keeping your leg straight and trying to raise your heel off the floor. This helps strengthen the largest muscle supporting your knee.  °Lying on your back, tighten up the muscles of your buttocks both with the legs straight and with the knee bent at a comfortable angle while keeping your heel on the floor.  ° °SKILLED REHAB INSTRUCTIONS: °If the patient is transferred to a skilled rehab facility following release from the hospital, a list of the current medications will be sent to the facility for the patient to continue.  When discharged from the skilled rehab facility, please have the facility set up the patient's Home Health Physical Therapy prior to being released. Also, the skilled facility will be responsible for providing the patient with their medications at time of release from the facility to include their pain medication and their blood thinner medication. If the patient is still at the rehab facility at time of the two week follow up appointment, the skilled rehab facility will also need to assist the patient in arranging follow up appointment in our office and any transportation needs. ° °MAKE SURE YOU:  °Understand these instructions.  °Will watch your condition.  °Will get help right away if you are not doing well or get worse. ° °Pick up stool softner and  laxative for home use following surgery while on pain medications. °Do not remove your dressing. °The dressing is waterproof--it is OK to take showers. °Continue to use ice for pain and swelling after surgery. °Do not use any lotions or creams on the incision until instructed by your surgeon. °Total Hip Protocol. ° ° °

## 2017-09-07 NOTE — Anesthesia Procedure Notes (Signed)
Procedure Name: Intubation Performed by: Gean Maidens, CRNA Pre-anesthesia Checklist: Patient identified, Emergency Drugs available, Suction available, Patient being monitored and Timeout performed Patient Re-evaluated:Patient Re-evaluated prior to induction Oxygen Delivery Method: Circle system utilized Preoxygenation: Pre-oxygenation with 100% oxygen Induction Type: IV induction Ventilation: Mask ventilation without difficulty Laryngoscope Size: Mac and 4 Grade View: Grade I Tube type: Oral Tube size: 7.5 mm Number of attempts: 1 Airway Equipment and Method: Stylet Placement Confirmation: ETT inserted through vocal cords under direct vision,  positive ETCO2,  CO2 detector and breath sounds checked- equal and bilateral Secured at: 23 cm Tube secured with: Tape Dental Injury: Teeth and Oropharynx as per pre-operative assessment

## 2017-09-07 NOTE — Anesthesia Postprocedure Evaluation (Signed)
Anesthesia Post Note  Patient: Jeffrey Hardy  Procedure(s) Performed: TOTAL HIP ARTHROPLASTY ANTERIOR APPROACH (Left Hip)     Patient location during evaluation: PACU Anesthesia Type: General Level of consciousness: awake and alert Pain management: pain level controlled Vital Signs Assessment: post-procedure vital signs reviewed and stable Respiratory status: spontaneous breathing, nonlabored ventilation, respiratory function stable and patient connected to nasal cannula oxygen Cardiovascular status: blood pressure returned to baseline and stable Postop Assessment: no apparent nausea or vomiting Anesthetic complications: no    Last Vitals:  Vitals:   09/07/17 1137 09/07/17 1236  BP: 114/67 101/64  Pulse: 72 69  Resp: 18 16  Temp: 36.8 C   SpO2: 100% 99%    Last Pain:  Vitals:   09/07/17 1140  TempSrc:   PainSc: 0-No pain                 Angus Amini S

## 2017-09-07 NOTE — Anesthesia Preprocedure Evaluation (Signed)
Anesthesia Evaluation  Patient identified by MRN, date of birth, ID band Patient awake    Reviewed: Allergy & Precautions, H&P , NPO status , Patient's Chart, lab work & pertinent test results  Airway Mallampati: II   Neck ROM: full    Dental   Pulmonary former smoker,    breath sounds clear to auscultation       Cardiovascular hypertension, + Peripheral Vascular Disease  + dysrhythmias Atrial Fibrillation  Rhythm:regular Rate:Normal     Neuro/Psych PSYCHIATRIC DISORDERS Anxiety Depression CVA    GI/Hepatic hiatal hernia, GERD  ,  Endo/Other    Renal/GU Renal InsufficiencyRenal disease     Musculoskeletal  (+) Arthritis ,   Abdominal   Peds  Hematology   Anesthesia Other Findings   Reproductive/Obstetrics                             Anesthesia Physical Anesthesia Plan  ASA: III  Anesthesia Plan: General   Post-op Pain Management:    Induction: Intravenous  PONV Risk Score and Plan: 2 and Ondansetron, Dexamethasone and Treatment may vary due to age or medical condition  Airway Management Planned: Oral ETT  Additional Equipment:   Intra-op Plan:   Post-operative Plan: Extubation in OR  Informed Consent: I have reviewed the patients History and Physical, chart, labs and discussed the procedure including the risks, benefits and alternatives for the proposed anesthesia with the patient or authorized representative who has indicated his/her understanding and acceptance.     Plan Discussed with: CRNA, Anesthesiologist and Surgeon  Anesthesia Plan Comments:         Anesthesia Quick Evaluation

## 2017-09-07 NOTE — Interval H&P Note (Signed)
History and Physical Interval Note:  09/07/2017 7:31 AM  Jeffrey Hardy  has presented today for surgery, with the diagnosis of left hip fracture  The various methods of treatment have been discussed with the patient and family. After consideration of risks, benefits and other options for treatment, the patient has consented to  Procedure(s): TOTAL HIP ARTHROPLASTY ANTERIOR APPROACH (Left) as a surgical intervention .  The patient's history has been reviewed, patient examined, no change in status, stable for surgery.  I have reviewed the patient's chart and labs.  Questions were answered to the patient's satisfaction.     Hilton Cork Kean Gautreau

## 2017-09-08 ENCOUNTER — Encounter (HOSPITAL_COMMUNITY): Payer: Self-pay | Admitting: Orthopedic Surgery

## 2017-09-08 LAB — CBC
HCT: 29.6 % — ABNORMAL LOW (ref 39.0–52.0)
Hemoglobin: 9.9 g/dL — ABNORMAL LOW (ref 13.0–17.0)
MCH: 32.2 pg (ref 26.0–34.0)
MCHC: 33.4 g/dL (ref 30.0–36.0)
MCV: 96.4 fL (ref 78.0–100.0)
Platelets: 91 10*3/uL — ABNORMAL LOW (ref 150–400)
RBC: 3.07 MIL/uL — ABNORMAL LOW (ref 4.22–5.81)
RDW: 13 % (ref 11.5–15.5)
WBC: 9 10*3/uL (ref 4.0–10.5)

## 2017-09-08 LAB — BASIC METABOLIC PANEL
Anion gap: 11 (ref 5–15)
BUN: 50 mg/dL — ABNORMAL HIGH (ref 6–20)
CO2: 23 mmol/L (ref 22–32)
Calcium: 8.2 mg/dL — ABNORMAL LOW (ref 8.9–10.3)
Chloride: 101 mmol/L (ref 101–111)
Creatinine, Ser: 1.99 mg/dL — ABNORMAL HIGH (ref 0.61–1.24)
GFR calc Af Amer: 35 mL/min — ABNORMAL LOW (ref >60)
GFR calc non Af Amer: 30 mL/min — ABNORMAL LOW (ref >60)
Glucose, Bld: 135 mg/dL — ABNORMAL HIGH (ref 65–99)
Potassium: 4.5 mmol/L (ref 3.5–5.1)
Sodium: 135 mmol/L (ref 135–145)

## 2017-09-08 MED ORDER — POLYETHYLENE GLYCOL 3350 17 G PO PACK
17.0000 g | PACK | Freq: Once | ORAL | Status: AC
  Administered 2017-09-08: 17 g via ORAL
  Filled 2017-09-08: qty 1

## 2017-09-08 MED ORDER — HYDROCODONE-ACETAMINOPHEN 5-325 MG PO TABS
1.0000 | ORAL_TABLET | ORAL | Status: DC | PRN
Start: 2017-09-08 — End: 2017-09-10

## 2017-09-08 NOTE — Progress Notes (Signed)
PROGRESS NOTE  Jeffrey Hardy OZH:[id number] DOB: 1936-03-20 DOA: 09/04/2017 PCP: Biagio Borg, MD  HPI/Recap of past 24 hours: Jeffrey Hardy is a 82 y.o. male with history of atrial fibrillation on Eliquis, hypertension, chronic kidney disease stage III, history of renal cell carcinoma status post right-sided nephrectomy had a fall at home while walking in his barn.  Patient fell onto his hip and since it was hurting came to the ER.  Denies hitting his head or losing consciousness denies any palpitation chest pain or shortness of breath.  Had taken his apixaban this morning.  ED Course: In the ER x-rays revealed left hip fracture.  On-call orthopedic surgeon was consulted.  Since patient has had apixaban today surgery is planned to be done on Sunday 2 days from now.  09/05/2017: Patient seen and examined at bedside.  Has no new complaints.  Surgery planned on Sunday, June 2.  09/06/17: B/l  Eye tan discharge suspect conjunctivitis. erythromyjcin ointment ordered. No other complaints.  09/07/17: POD#0 post left hip repair. Denies pain or dyspnea. Has no new complaints.  09/08/17: No new complaints. Denies chest pain or dyspnea. Denies left hip pain when immobile.  Assessment/Plan: Principal Problem:   Closed left hip fracture, initial encounter [facility]) Active Problems:   Essential hypertension   Thrombocytopenia (HCC)   Cerebrovascular disease, arteriosclerotic, post-stroke   Renal cell cancer (HCC)   CKD (chronic kidney disease) stage 3, GFR 30-59 ml/min (HCC)   Hip fracture (HCC)   Closed displaced fracture of left femoral neck (HCC)  Left hip Fracture POD#1 post repair Pain management and bowel regimen in place Defer to ortho surgery to restart anticoagulation  Hiccups, resolved Hx of hiccups on baclofen Stopped baclofen and started thorazine tid Now resolved  P. afib on eliquis Rate controlled Defer to orthopedic surgery to restart eliquis On eliquis 2.5 mg BID for 48-72 hours  per surgery  B/L conjunctivitis, resolving C/w erythromycin ointment x 2 days  HTN, stable C/w home meds  AKI on CKD 3, imrpoving Avoid nephrotoxic agents Monitor urine output Repeat BMP in the morning  Chronic thrombocytopenia Platelets 115 Continue to monitor     Code Status: Full code  Family Communication: None at bedside  Disposition Plan: SNF post surgery   Consultants:  Orthopedic surgery  Procedures: Left hip repair  Antimicrobials:  None  DVT prophylaxis: eliquis   Objective: Vitals:   09/08/17 0631 09/08/17 0633 09/08/17 0929 09/08/17 1343  BP:  106/69 106/62 106/61  Pulse: 66 67 75 76  Resp: 17  16 16   Temp: 98 F (36.7 C)  (!) 97.5 F (36.4 C) (!) 97.5 F (36.4 C)  TempSrc:   Oral Oral  SpO2: 98% 98% 99% 100%  Weight:      Height:        Intake/Output Summary (Last 24 hours) at 09/08/2017 1623 Last data filed at 09/08/2017 1533 Gross per 24 hour  Intake 600 ml  Output 1025 ml  Net -425 ml   Filed Weights   09/04/17 1230 09/04/17 1838  Weight: 89.4 kg (197 lb) 87.8 kg (193 lb 9 oz)    Exam:  . General: 82 y.o. year-old male WD WN nAD A&O x 3 . Cardiovascular: RRR no rubs or gallops. No JVD or thyromegaly..   . Respiratory: Clear to auscultation with no wheezes or rales. Good inspiratory effort. . Abdomen: Soft nontender nondistended with normal bowel sounds x4 quadrants. . Musculoskeletal: Trace edema in LE bilaterally. . Skin: No  ulcerative lesions noted or rashes, . Psychiatry: Mood is appropriate for condition and setting   Data Reviewed: CBC: Recent Labs  Lab 09/04/17 1354 09/05/17 0608 09/06/17 0508 09/07/17 0518 09/08/17 0549  WBC 10.4 8.4 8.5 5.9 9.0  NEUTROABS 9.5*  --   --   --   --   HGB 15.0 13.4 13.3 13.0 9.9*  HCT 42.6 39.6 40.0 39.2 29.6*  MCV 95.7 98.3 97.3 96.6 96.4  PLT 132* 115* 92* 97* 91*   Basic Metabolic Panel: Recent Labs  Lab 09/04/17 1354 09/05/17 0608 09/06/17 0508 09/07/17 0518  09/08/17 0549  NA 138 141 138 137 135  K 4.1 4.3 4.0 4.2 4.5  CL 105 107 103 103 101  CO2 22 28 25 23 23   GLUCOSE 114* 117* 135* 120* 135*  BUN 29* 30* 34* 39* 50*  CREATININE 2.05* 2.10* 2.05* 2.03* 1.99*  CALCIUM 9.2 8.8* 8.9 8.8* 8.2*   GFR: Estimated Creatinine Clearance: 34 mL/min (A) (by C-G formula based on SCr of 1.99 mg/dL (H)). Liver Function Tests: No results for input(s): AST, ALT, ALKPHOS, BILITOT, PROT, ALBUMIN in the last 168 hours. No results for input(s): LIPASE, AMYLASE in the last 168 hours. No results for input(s): AMMONIA in the last 168 hours. Coagulation Profile: Recent Labs  Lab 09/04/17 1354  INR 1.12   Cardiac Enzymes: No results for input(s): CKTOTAL, CKMB, CKMBINDEX, TROPONINI in the last 168 hours. BNP (last 3 results) No results for input(s): PROBNP in the last 8760 hours. HbA1C: No results for input(s): HGBA1C in the last 72 hours. CBG: Recent Labs  Lab 09/06/17 0724  GLUCAP 118*   Lipid Profile: No results for input(s): CHOL, HDL, LDLCALC, TRIG, CHOLHDL, LDLDIRECT in the last 72 hours. Thyroid Function Tests: No results for input(s): TSH, T4TOTAL, FREET4, T3FREE, THYROIDAB in the last 72 hours. Anemia Panel: No results for input(s): VITAMINB12, FOLATE, FERRITIN, TIBC, IRON, RETICCTPCT in the last 72 hours. Urine analysis:    Component Value Date/Time   COLORURINE YELLOW 09/03/2016 0831   APPEARANCEUR CLEAR 09/03/2016 0831   LABSPEC 1.015 09/03/2016 0831   PHURINE 6.0 09/03/2016 0831   GLUCOSEU NEGATIVE 09/03/2016 0831   HGBUR NEGATIVE 09/03/2016 0831   BILIRUBINUR NEGATIVE 09/03/2016 0831   KETONESUR NEGATIVE 09/03/2016 0831   PROTEINUR NEGATIVE 01/04/2012 0851   UROBILINOGEN 0.2 09/03/2016 0831   NITRITE NEGATIVE 09/03/2016 0831   LEUKOCYTESUR NEGATIVE 09/03/2016 0831   Sepsis Labs: @LABRCNTIP (procalcitonin:4,lacticidven:4)  ) Recent Results (from the past 240 hour(s))  MRSA PCR Screening     Status: None   Collection  Time: 09/06/17 10:00 AM  Result Value Ref Range Status   MRSA by PCR NEGATIVE NEGATIVE Final    Comment:        The GeneXpert MRSA Assay (FDA approved for NASAL specimens only), is one component of a comprehensive MRSA colonization surveillance program. It is not intended to diagnose MRSA infection nor to guide or monitor treatment for MRSA infections. Performed at [facility], [place] [address] [place], [place] [address]       Studies: No results found.  Scheduled Meds: . apixaban  2.5 mg Oral BID  . chlorproMAZINE  10 mg Oral TID  . docusate sodium  100 mg Oral BID  . erythromycin   Both Eyes BID  . feeding supplement (ENSURE ENLIVE)  237 mL Oral BID BM  . senna  1 tablet Oral Daily  . tamsulosin  0.4 mg Oral QPC breakfast    Continuous Infusions:  LOS: 4 days     Kayleen Memos, MD [facility] Pager [phone]  If 7PM-7AM, please contact night-coverage [email or web address] Password [facility] 09/08/2017, 4:23 PM

## 2017-09-08 NOTE — Progress Notes (Signed)
Physical Therapy Treatment Patient Details Name: DEKLAN MINAR MRN: [id number] DOB: 1936-03-23 Today's Date: 09/08/2017    History of Present Illness 82 yo male admitted with L hip fx after sustaining a fall at home. S/P L THA-direct anterior 6/2    PT Comments    Progressing with mobility. Cues throughout session for safe technique. LOB x 1 while standing to use urinal.    Follow Up Recommendations  Home health PT;Supervision/Assistance - 24 hour     Equipment Recommendations  Rolling walker with 5" wheels    Recommendations for Other Services       Precautions / Restrictions Precautions Precautions: Fall Restrictions Weight Bearing Restrictions: No LLE Weight Bearing: Weight bearing as tolerated    Mobility  Bed Mobility Overal bed mobility: Needs Assistance Bed Mobility: Sit to Supine     Supine to sit: Mod assist Sit to supine: Min assist;HOB elevated   General bed mobility comments: Increased time. Cues for safety, technique. Assist for L LE.   Transfers Overall transfer level: Needs assistance Equipment used: Rolling walker (2 wheeled) Transfers: Sit to/from Stand Sit to Stand: Mod assist         General transfer comment: Mod assist from low recliner. VCS safety, technique, hand/LE placement  Ambulation/Gait Ambulation/Gait assistance: Min assist Ambulation Distance (Feet): 65 Feet Assistive device: Rolling walker (2 wheeled) Gait Pattern/deviations: Step-to pattern     General Gait Details: Mod VCs for safety, sequence, distance from RW, step length. Assist to stabilize throughout distance.    Stairs             Wheelchair Mobility    Modified Rankin (Stroke Patients Only)       Balance Overall balance assessment: Needs assistance;History of Falls         Standing balance support: Bilateral upper extremity supported Standing balance-Leahy Scale: Poor                              Cognition Arousal/Alertness:  Awake/alert Behavior During Therapy: WFL for tasks assessed/performed Overall Cognitive Status: Within Functional Limits for tasks assessed                                 General Comments: a bit talkative      Exercises     General Comments        Pertinent Vitals/Pain Pain Assessment: 0-10 Pain Score: 3  Pain Location: L hip  Pain Descriptors / Indicators: Sore;Aching Pain Intervention(s): Monitored during session;Repositioned;Ice applied    Home Living Family/patient expects to be discharged to:: Private residence Living Arrangements: Spouse/significant other Available Help at Discharge: Family;Available 24 hours/day Type of Home: House Home Access: Stairs to enter Entrance Stairs-Rails: Right Home Layout: One level;Laundry or work area in [employer]: None      Prior Function Level of Independence: Independent          PT Goals (current goals can now be found in the care plan section) Acute Rehab PT Goals Patient Stated Goal: regain independence PT Goal Formulation: With patient/family Time For Goal Achievement: 09/22/17 Potential to Achieve Goals: Good Progress towards PT goals: Progressing toward goals    Frequency    7X/week      PT Plan Current plan remains appropriate    Co-evaluation              AM-PAC PT "6 Clicks" Daily  Activity  Outcome Measure  Difficulty turning over in bed (including adjusting bedclothes, sheets and blankets)?: Unable Difficulty moving from lying on back to sitting on the side of the bed? : Unable Difficulty sitting down on and standing up from a chair with arms (e.g., wheelchair, bedside commode, etc,.)?: Unable Help needed moving to and from a bed to chair (including a wheelchair)?: A Little Help needed walking in hospital room?: A Little Help needed climbing 3-5 steps with a railing? : A Little 6 Click Score: 12    End of Session Equipment Utilized During Treatment: Gait  belt Activity Tolerance: Patient tolerated treatment well Patient left: in bed;with call bell/phone within reach;with bed alarm set   PT Visit Diagnosis: Muscle weakness (generalized) (M62.81);Difficulty in walking, not elsewhere classified (R26.2)     Time: 1340-1403 PT Time Calculation (min) (ACUTE ONLY): 23 min  Charges:  $Gait Training: 23-37 mins                    G Codes:          Weston Anna, MPT Pager: [phone]

## 2017-09-08 NOTE — Progress Notes (Signed)
    Subjective:  Patient reports pain as mild to moderate.  Denies N/V/CP/SOB. No c/o.  Objective:   VITALS:   Vitals:   09/07/17 2017 09/08/17 0121 09/08/17 0631 09/08/17 0633  BP: (!) 109/58 131/75  106/69  Pulse:  75 66 67  Resp: 16 19 17    Temp: 98 F (36.7 C) (!) 97.5 F (36.4 C) 98 F (36.7 C)   TempSrc: Oral Oral    SpO2:  97% 98% 98%  Weight:      Height:        NAD ABD soft Sensation intact distally Intact pulses distally Dorsiflexion/Plantar flexion intact Incision: dressing C/D/I Compartment soft   Lab Results  Component Value Date   WBC 9.0 09/08/2017   HGB 9.9 (L) 09/08/2017   HCT 29.6 (L) 09/08/2017   MCV 96.4 09/08/2017   PLT 91 (L) 09/08/2017   BMET    Component Value Date/Time   NA 135 09/08/2017 0549   K 4.5 09/08/2017 0549   CL 101 09/08/2017 0549   CO2 23 09/08/2017 0549   GLUCOSE 135 (H) 09/08/2017 0549   BUN 50 (H) 09/08/2017 0549   CREATININE 1.99 (H) 09/08/2017 0549   CALCIUM 8.2 (L) 09/08/2017 0549   GFRNONAA 30 (L) 09/08/2017 0549   GFRAA 35 (L) 09/08/2017 0549     Assessment/Plan: 1 Day Post-Op   Principal Problem:   Closed left hip fracture, initial encounter (HCC) Active Problems:   Essential hypertension   Thrombocytopenia (HCC)   Cerebrovascular disease, arteriosclerotic, post-stroke   Renal cell cancer (HCC)   CKD (chronic kidney disease) stage 3, GFR 30-59 ml/min (HCC)   Hip fracture (HCC)   Closed displaced fracture of left femoral neck (HCC)  Anticipated LOS equal to or greater than 2 midnights due to - Age 57 and older with one or more of the following:  - Obesity  - Expected need for hospital services (PT, OT, Nursing) required for safe  discharge  - Anticipated need for postoperative skilled nursing care or inpatient rehab  - Active co-morbidities: None OR   - Unanticipated findings during/Post Surgery: Lab abnormalities  - Patient is a high risk of re-admission due to: None   WBAT with  walker DVT ppx: eliquis 2.5 mg PO BID for 48-72 hrs postop --> then resume home dose, SCDs, TEDS PO pain control PT/OT Dispo: D/C home with HHPT   Hilton Cork Ashleynicole Mcclees 09/08/2017, 7:38 AM   Rod Can, MD Cell [phone]

## 2017-09-08 NOTE — Evaluation (Signed)
Physical Therapy Evaluation Patient Details Name: Jeffrey Hardy MRN: [id number] DOB: June 24, 1935 Today's Date: 09/08/2017   History of Present Illness  82 yo male admitted with L hip fx after sustaining a fall at home. S/P L THA-direct anterior 6/2  Clinical Impression  On eval, pt required Mod assist for bed mobility and Min assist to stand and ambulate ~40 feet with a RW. Minimal pain with activity. Pt presents with general weakness, decreased activity tolerance, and impaired gait and balance. Recommend HHPT f/u as long as pt progresses well. Will follow and progress activity as tolerated.     Follow Up Recommendations Home health PT;Supervision/Assistance - 24 hour    Equipment Recommendations  Rolling walker with 5" wheels    Recommendations for Other Services       Precautions / Restrictions Precautions Precautions: Fall Restrictions Weight Bearing Restrictions: No LLE Weight Bearing: Weight bearing as tolerated      Mobility  Bed Mobility Overal bed mobility: Needs Assistance Bed Mobility: Supine to Sit     Supine to sit: Mod assist     General bed mobility comments: Increased time. Cues for safety, technique. Assist for trunk and L LE.   Transfers Overall transfer level: Needs assistance Equipment used: Rolling walker (2 wheeled) Transfers: Sit to/from Stand Sit to Stand: From elevated surface;Min assist         General transfer comment: Assist to rise, stabilize, control descent. VCs safety, technique, hand/LE placement.   Ambulation/Gait Ambulation/Gait assistance: Min assist Ambulation Distance (Feet): 40 Feet Assistive device: Rolling walker (2 wheeled) Gait Pattern/deviations: Step-to pattern     General Gait Details: Mod VCs for safety, sequence, distance from RW, step length. Assist to stabilize throughout distance. Followed with recliner for safety.   Stairs            Wheelchair Mobility    Modified Rankin (Stroke Patients Only)       Balance Overall balance assessment: Needs assistance;History of Falls         Standing balance support: Bilateral upper extremity supported Standing balance-Leahy Scale: Poor                               Pertinent Vitals/Pain Pain Assessment: 0-10 Pain Score: 2  Pain Location: L hip with activity/exercises Pain Descriptors / Indicators: Sore;Aching Pain Intervention(s): Monitored during session;Repositioned    Home Living Family/patient expects to be discharged to:: Private residence Living Arrangements: Spouse/significant other Available Help at Discharge: Family;Available 24 hours/day Type of Home: House Home Access: Stairs to enter Entrance Stairs-Rails: Right Entrance Stairs-Number of Steps: 2 Home Layout: One level;Laundry or work area in [employer]: None      Prior Function Level of Independence: Independent               Journalist, newspaper        Extremity/Trunk Assessment   Upper Extremity Assessment Upper Extremity Assessment: Generalized weakness    Lower Extremity Assessment Lower Extremity Assessment: Generalized weakness    Cervical / Trunk Assessment Cervical / Trunk Assessment: Kyphotic  Communication   Communication: No difficulties  Cognition Arousal/Alertness: Awake/alert Behavior During Therapy: WFL for tasks assessed/performed Overall Cognitive Status: Within Functional Limits for tasks assessed                                 General Comments: a bit talkative  General Comments      Exercises General Exercises - Lower Extremity Ankle Circles/Pumps: AROM;Both;10 reps;Supine Quad Sets: AROM;Both;10 reps;Supine Heel Slides: AAROM;Left;10 reps;Supine Hip ABduction/ADduction: AAROM;Left;10 reps;Supine   Assessment/Plan    PT Assessment Patient needs continued PT services  PT Problem List Decreased strength;Decreased balance;Decreased range of motion;Decreased mobility;Decreased  activity tolerance;Decreased safety awareness;Decreased knowledge of use of DME;Pain       PT Treatment Interventions DME instruction;Gait training;Functional mobility training;Balance training;Therapeutic activities;Patient/family education;Stair training;Therapeutic exercise    PT Goals (Current goals can be found in the Care Plan section)  Acute Rehab PT Goals Patient Stated Goal: regain independence PT Goal Formulation: With patient/family Time For Goal Achievement: 09/22/17 Potential to Achieve Goals: Good    Frequency 7X/week   Barriers to discharge        Co-evaluation               AM-PAC PT "6 Clicks" Daily Activity  Outcome Measure Difficulty turning over in bed (including adjusting bedclothes, sheets and blankets)?: Unable Difficulty moving from lying on back to sitting on the side of the bed? : Unable Difficulty sitting down on and standing up from a chair with arms (e.g., wheelchair, bedside commode, etc,.)?: Unable Help needed moving to and from a bed to chair (including a wheelchair)?: A Little Help needed walking in hospital room?: A Little Help needed climbing 3-5 steps with a railing? : A Little 6 Click Score: 12    End of Session Equipment Utilized During Treatment: Gait belt Activity Tolerance: Patient tolerated treatment well Patient left: in chair;with call bell/phone within reach;with chair alarm set;with family/visitor present   PT Visit Diagnosis: Muscle weakness (generalized) (M62.81);Difficulty in walking, not elsewhere classified (R26.2)    Time: 9476-5465 PT Time Calculation (min) (ACUTE ONLY): 32 min   Charges:   PT Evaluation $PT Eval Moderate Complexity: 1 Mod PT Treatments $Gait Training: 8-22 mins   PT G Codes:          Weston Anna, MPT Pager: [phone]

## 2017-09-09 LAB — CBC
HCT: 28.2 % — ABNORMAL LOW (ref 39.0–52.0)
Hemoglobin: 9.5 g/dL — ABNORMAL LOW (ref 13.0–17.0)
MCH: 32.8 pg (ref 26.0–34.0)
MCHC: 33.7 g/dL (ref 30.0–36.0)
MCV: 97.2 fL (ref 78.0–100.0)
Platelets: 101 10*3/uL — ABNORMAL LOW (ref 150–400)
RBC: 2.9 MIL/uL — ABNORMAL LOW (ref 4.22–5.81)
RDW: 13.2 % (ref 11.5–15.5)
WBC: 5.8 10*3/uL (ref 4.0–10.5)

## 2017-09-09 LAB — BASIC METABOLIC PANEL
Anion gap: 10 (ref 5–15)
BUN: 57 mg/dL — ABNORMAL HIGH (ref 6–20)
CO2: 26 mmol/L (ref 22–32)
Calcium: 8.1 mg/dL — ABNORMAL LOW (ref 8.9–10.3)
Chloride: 104 mmol/L (ref 101–111)
Creatinine, Ser: 2 mg/dL — ABNORMAL HIGH (ref 0.61–1.24)
GFR calc Af Amer: 35 mL/min — ABNORMAL LOW (ref >60)
GFR calc non Af Amer: 30 mL/min — ABNORMAL LOW (ref >60)
Glucose, Bld: 106 mg/dL — ABNORMAL HIGH (ref 65–99)
Potassium: 4.2 mmol/L (ref 3.5–5.1)
Sodium: 140 mmol/L (ref 135–145)

## 2017-09-09 MED ORDER — HYDROCODONE-ACETAMINOPHEN 5-325 MG PO TABS: 1.0000 | ORAL_TABLET | Freq: Four times a day (QID) | ORAL | 0 refills | Status: DC | PRN

## 2017-09-09 MED ORDER — APIXABAN 5 MG PO TABS
5.0000 mg | ORAL_TABLET | Freq: Two times a day (BID) | ORAL | Status: DC
  Administered 2017-09-10: 5 mg via ORAL
  Filled 2017-09-09: qty 1

## 2017-09-09 MED ORDER — SENNA 8.6 MG PO TABS
1.0000 | ORAL_TABLET | Freq: Two times a day (BID) | ORAL | Status: DC
  Administered 2017-09-09 – 2017-09-10 (×2): 8.6 mg via ORAL
  Filled 2017-09-09 (×2): qty 1

## 2017-09-09 MED ORDER — POLYETHYLENE GLYCOL 3350 17 G PO PACK
17.0000 g | PACK | Freq: Every day | ORAL | Status: DC
  Administered 2017-09-09 – 2017-09-10 (×2): 17 g via ORAL
  Filled 2017-09-09 (×2): qty 1

## 2017-09-09 NOTE — Progress Notes (Signed)
Discharge planning, spoke with patient and daughter at beside. Chose [facility] for [facility] services, PT to eval and treat. Contacted [facility] for referral. Needs a RW and 3-n-1, contacted [facility] to deliver to the room. [phone]

## 2017-09-09 NOTE — Consult Note (Addendum)
   [facility] CM Inpatient Consult   09/09/2017  KEANTHONY POOLE [birth year] [id number]   Patient screened for potential [facility] Care Management program due to high risk unplanned readmission score 21% (medium).  Spoke with Mr. Chesmore and daughter at bedside. Discussed [facility] Care Management program for [place] follow up for AFIB. Mr. Holaway pleasantly declines [facility] Care Management services.  His daughter endorses that both Mr. Briggs and wife manage their care and medications at home appropriately. No concerns with disease management, medications, transportation. States he follows up well with his Cardiologist for AFIB.   Provided [facility] Care Management brochure with contact information along with 24-hr advice line magnet to call if needed.   Mr. Marrazzo expresses appreciation of visit.   Marthenia Rolling, MSN-Ed, RN,BSN [facility] Liaison [phone]

## 2017-09-09 NOTE — Progress Notes (Signed)
    Subjective:  Patient reports pain as mild.  Denies N/V/CP/SOB. No c/o.  Objective:   VITALS:   Vitals:   09/08/17 0929 09/08/17 1343 09/08/17 2202 09/09/17 0730  BP: 106/62 106/61 104/66 97/63  Pulse: 75 76 79 79  Resp: 16 16 16    Temp: (!) 97.5 F (36.4 C) (!) 97.5 F (36.4 C) 98.5 F (36.9 C)   TempSrc: Oral Oral Oral   SpO2: 99% 100% 99%   Weight:      Height:        NAD ABD soft Sensation intact distally Intact pulses distally Dorsiflexion/Plantar flexion intact Incision: dressing C/D/I Compartment soft   Lab Results  Component Value Date   WBC 5.8 09/09/2017   HGB 9.5 (L) 09/09/2017   HCT 28.2 (L) 09/09/2017   MCV 97.2 09/09/2017   PLT 101 (L) 09/09/2017   BMET    Component Value Date/Time   NA 140 09/09/2017 0536   K 4.2 09/09/2017 0536   CL 104 09/09/2017 0536   CO2 26 09/09/2017 0536   GLUCOSE 106 (H) 09/09/2017 0536   BUN 57 (H) 09/09/2017 0536   CREATININE 2.00 (H) 09/09/2017 0536   CALCIUM 8.1 (L) 09/09/2017 0536   GFRNONAA 30 (L) 09/09/2017 0536   GFRAA 35 (L) 09/09/2017 0536     Assessment/Plan: 2 Days Post-Op   Principal Problem:   Closed left hip fracture, initial encounter ([place]) Active Problems:   Essential hypertension   Thrombocytopenia (HCC)   Cerebrovascular disease, arteriosclerotic, post-stroke   Renal cell cancer (HCC)   CKD (chronic kidney disease) stage 3, GFR 30-59 ml/min (HCC)   Hip fracture (HCC)   Closed displaced fracture of left femoral neck ([place])   WBAT with walker DVT ppx: eliquis 2.5 mg PO BID for 48-72 hrs postop --> then resume home dose, SCDs, TEDS PO pain control PT/OT Dispo: D/C home with HHPT when ok with hospitalist   Jeffrey Hardy Phill Steck 09/09/2017, 1:25 PM   Rod Can, MD Cell [phone]

## 2017-09-09 NOTE — Evaluation (Signed)
Occupational Therapy Evaluation Patient Details Name: Jeffrey Hardy MRN: [id number] DOB: 11-27-1935 Today's Date: 09/09/2017    History of Present Illness 82 yo male admitted with L hip fx after sustaining a fall at home. S/P L THA-direct anterior 6/2   Clinical Impression   Pt was admitted for the above.  He is independent at baseline and cares for many animals [place] his [place].  Pt currently needs mod to max A for LB adls and min A for toilet transfers. Will follow in acute setting with min guard level goals.       Follow Up Recommendations  Supervision/Assistance - 24 hour    Equipment Recommendations  3 in 1 bedside commode    Recommendations for Other Services       Precautions / Restrictions Precautions Precautions: Fall Restrictions Weight Bearing Restrictions: No LLE Weight Bearing: Weight bearing as tolerated      Mobility Bed Mobility         Supine to sit: Min assist     General bed mobility comments: extra time.  Assist for trunk  Transfers   Equipment used: Rolling walker (2 wheeled)   Sit to Stand: Min assist         General transfer comment: from high bed.  Assist to rise and steady    Balance                                           ADL either performed or assessed with clinical judgement   ADL Overall ADL's : Needs assistance/impaired Eating/Feeding: Independent   Grooming: Oral care;Standing;Min guard   Upper Body Bathing: Set up;Sitting   Lower Body Bathing: Moderate assistance;Sit to/from stand   Upper Body Dressing : Minimal assistance;Sitting   Lower Body Dressing: Maximal assistance;Sit to/from stand   Toilet Transfer: Minimal assistance;Ambulation;BSC;RW   Toileting- Clothing Manipulation and Hygiene: Minimal assistance;Sit to/from stand         General ADL Comments: extra time for all activities.  Pt felt a bit woozy when he first sat up:  BP has been running on lower side.  Ambulated to bathroom then  nursing came in to take over      Flippin      Pertinent Vitals/Pain Pain Score: 1  Pain Location: L hip  Pain Descriptors / Indicators: Sore Pain Intervention(s): Limited activity within patient's tolerance;Monitored during session;Repositioned(removed ice; declined meds)     Hand Dominance Right   Extremity/Trunk Assessment Upper Extremity Assessment Upper Extremity Assessment: Generalized weakness           Communication Communication Communication: No difficulties   Cognition Arousal/Alertness: Awake/alert Behavior During Therapy: WFL for tasks assessed/performed Overall Cognitive Status: Within Functional Limits for tasks assessed                                 General Comments: talkative   General Comments       Exercises     Shoulder Instructions      Home Living Family/patient expects to be discharged to:: Private residence Living Arrangements: Spouse/significant other Available Help at Discharge: Family;Available 24 hours/day               Bathroom Shower/Tub: Occupational psychologist: Standard  Home Equipment: None          Prior Functioning/Environment Level of Independence: Independent        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        OT Problem List: Decreased strength;Decreased activity tolerance;Pain;Decreased knowledge of use of DME or AE      OT Treatment/Interventions: Self-care/ADL training;DME and/or AE instruction;Patient/family education    OT Goals(Current goals can be found in the care plan section) Acute Rehab OT Goals Patient Stated Goal: regain independence OT Goal Formulation: With patient Time For Goal Achievement: 09/23/17 Potential to Achieve Goals: Good ADL Goals Pt Will Transfer to Toilet: with min guard assist;ambulating;bedside commode Pt Will Perform Tub/Shower Transfer: Shower transfer;with min guard assist;ambulating;3 in 1 Additional  ADL Goal #1: pt will verbalize use of AE for ADLs (vs demonstrate if interested in obtaining)  OT Frequency: Min 2X/week   Barriers to D/C:            Co-evaluation              AM-PAC PT "6 Clicks" Daily Activity     Outcome Measure Help from another person eating meals?: None Help from another person taking care of personal grooming?: A Little Help from another person toileting, which includes using toliet, bedpan, or urinal?: A Little Help from another person bathing (including washing, rinsing, drying)?: A Lot Help from another person to put on and taking off regular upper body clothing?: A Little Help from another person to put on and taking off regular lower body clothing?: A Lot 6 Click Score: 17   End of Session    Activity Tolerance: Patient tolerated treatment well Patient left: (on 3:1 in bathroom with NT present)  OT Visit Diagnosis: Pain Pain - Right/Left: Left Pain - part of body: Hip                Time: 2500-3704 OT Time Calculation (min): 48 min Charges:  OT General Charges $OT Visit: 1 Visit OT Evaluation $OT Eval Low Complexity: 1 Low OT Treatments $Self Care/Home Management : 23-37 mins G-Codes:     [place], OTR/L [phone] 09/09/2017  Kenadee Gates 09/09/2017, 10:06 AM

## 2017-09-09 NOTE — Progress Notes (Signed)
Physical Therapy Treatment Patient Details Name: Jeffrey Hardy MRN: [id number] DOB: 07-25-1935 Today's Date: 09/09/2017    History of Present Illness 82 yo male admitted with L hip fx after sustaining a fall at home. S/P L THA-direct anterior 6/2    PT Comments    POD # 1 am session Assisted OOB to amb in hallway with walker.  General Gait Details: Mod VCs for safety, sequence, distance from RW, step length. Assist to stabilize throughout distance. Returned to room to perform some THR TE's following HEP handout.  Instructed on freq as well as use of ICE.   Follow Up Recommendations  Home health PT;Supervision/Assistance - 24 hour     Equipment Recommendations  Rolling walker with 5" wheels;3in1 (PT)    Recommendations for Other Services       Precautions / Restrictions Precautions Precautions: Fall Restrictions Weight Bearing Restrictions: No LLE Weight Bearing: Weight bearing as tolerated    Mobility  Bed Mobility Overal bed mobility: Needs Assistance Bed Mobility: Supine to Sit     Supine to sit: Min guard;Min assist     General bed mobility comments: extra time.  Assist for trunk to complete scooting to EOB  Transfers Overall transfer level: Needs assistance Equipment used: Rolling walker (2 wheeled) Transfers: Sit to/from Stand Sit to Stand: Min assist         General transfer comment: from high bed.  Assist to rise and steady  Ambulation/Gait Ambulation/Gait assistance: Min assist Ambulation Distance (Feet): 75 Feet Assistive device: Rolling walker (2 wheeled) Gait Pattern/deviations: Step-to pattern Gait velocity: decreased   General Gait Details: Mod VCs for safety, sequence, distance from RW, step length. Assist to stabilize throughout distance.    Stairs             Wheelchair Mobility    Modified Rankin (Stroke Patients Only)       Balance                                            Cognition  Arousal/Alertness: Awake/alert Behavior During Therapy: WFL for tasks assessed/performed Overall Cognitive Status: Within Functional Limits for tasks assessed                                 General Comments: talkative      Exercises      General Comments        Pertinent Vitals/Pain Pain Assessment: Faces Faces Pain Scale: Hurts a little bit Pain Location: L hip  Pain Descriptors / Indicators: Sore Pain Intervention(s): Monitored during session;Repositioned;Ice applied    Home Living                      Prior Function            PT Goals (current goals can now be found in the care plan section) Progress towards PT goals: Progressing toward goals    Frequency    7X/week      PT Plan Current plan remains appropriate    Co-evaluation              AM-PAC PT "6 Clicks" Daily Activity  Outcome Measure  Difficulty turning over in bed (including adjusting bedclothes, sheets and blankets)?: A Lot Difficulty moving from lying on back to sitting on the side of  the bed? : A Lot Difficulty sitting down on and standing up from a chair with arms (e.g., wheelchair, bedside commode, etc,.)?: A Lot Help needed moving to and from a bed to chair (including a wheelchair)?: A Lot Help needed walking in hospital room?: A Lot Help needed climbing 3-5 steps with a railing? : A Lot 6 Click Score: 12    End of Session Equipment Utilized During Treatment: Gait belt Activity Tolerance: Patient tolerated treatment well Patient left: in chair;with call bell/phone within reach;with chair alarm set;with family/visitor present   PT Visit Diagnosis: Muscle weakness (generalized) (M62.81);Difficulty in walking, not elsewhere classified (R26.2)     Time: 5436-0677 PT Time Calculation (min) (ACUTE ONLY): 34 min  Charges:  $Gait Training: 8-22 mins $Therapeutic Exercise: 8-22 mins                    G Codes:       Rica Koyanagi  PTA [facility]  Acute   Rehab Pager      [phone]

## 2017-09-09 NOTE — Progress Notes (Signed)
PROGRESS NOTE  AMANI NODARSE VVO:[id number] DOB: 03-Feb-1936 DOA: 09/04/2017 PCP: Biagio Borg, MD  HPI/Recap of past 24 hours: Jeffrey Hardy is a 82 y.o. male with history of atrial fibrillation on Eliquis, hypertension, chronic kidney disease stage III, history of renal cell carcinoma status post right-sided nephrectomy had a fall at home while walking in his barn.  Patient fell onto his hip and since it was hurting came to the ER.  Denies hitting his head or losing consciousness denies any palpitation chest pain or shortness of breath.  Had taken his apixaban this morning.  ED Course: In the ER x-rays revealed left hip fracture.  On-call orthopedic surgeon was consulted.  Since patient has had apixaban today surgery is planned to be done on Sunday, 2 days from now.  09/07/17: POD#0 post left hip repair. Tolerated well.  09/09/17: POD# 2 post left hip repair. Denies pain. No new complaints. Currently on 2.5 mg eliquis BID. Resume home dose eliquis 5mg  BID tomorrow 09/10/17. Discharge to home with home health PT if no acute issues overnight.  Assessment/Plan: Principal Problem:   Closed left hip fracture, initial encounter [facility]) Active Problems:   Essential hypertension   Thrombocytopenia (HCC)   Cerebrovascular disease, arteriosclerotic, post-stroke   Renal cell cancer (HCC)   CKD (chronic kidney disease) stage 3, GFR 30-59 ml/min (HCC)   Hip fracture (HCC)   Closed displaced fracture of left femoral neck (HCC)  Left hip Fracture POD#2 post left hip repair Pain management and bowel regimen in place C/w PT as recommended by ortho  Hiccups, resolved Hx of hiccups on baclofen Stopped baclofen and started thorazine tid Now resolved  P. afib on eliquis Currently in sinus rhythm Currently on eliquis 2.5 mg BID  Restart home dose eliquis 5mg  BID on 09/09/17 prior to discharge  B/L conjunctivitis, resolved Treated with erythromycin ointment x 2 days  HTN, stable C/w home meds  AKI on  CKD 3 Baseline cr 1.80 Cr 2.10 (4 ) days ago Cr 2.0 today Avoid nephrotoxic agents Monitor urine output Repeat BMP am  Chronic thrombocytopenia Platelets 101 Repeat CBC am     Code Status: Full code  Family Communication: None at bedside  Disposition Plan: Home with HHPT possibly tomorrow 09/10/17.   Consultants:  Orthopedic surgery  Procedures: Left hip repair  Antimicrobials:  None  DVT prophylaxis: eliquis   Objective: Vitals:   09/08/17 0929 09/08/17 1343 09/08/17 2202 09/09/17 0730  BP: 106/62 106/61 104/66 97/63  Pulse: 75 76 79 79  Resp: 16 16 16    Temp: (!) 97.5 F (36.4 C) (!) 97.5 F (36.4 C) 98.5 F (36.9 C)   TempSrc: Oral Oral Oral   SpO2: 99% 100% 99%   Weight:      Height:        Intake/Output Summary (Last 24 hours) at 09/09/2017 1459 Last data filed at 09/09/2017 1133 Gross per 24 hour  Intake 960 ml  Output 1550 ml  Net -590 ml   Filed Weights   09/04/17 1230 09/04/17 1838  Weight: 89.4 kg (197 lb) 87.8 kg (193 lb 9 oz)    Exam:  . General: 82 y.o. year-old male WD wN NAD A&O x 3 . Cardiovascular: RRR no rubs or gallops. No JVD or thyromegaly. [name] Respiratory: Clear to auscultation with no wheezes or rales. Good inspiratory effort. . Abdomen: Soft nontender nondistended with normal bowel sounds x4 quadrants. . Musculoskeletal: left hip at surgical site appears clean with no drainage. [name]  Skin: No ulcerative lesions noted or rashes . Psychiatry: Mood is appropriate for condition and setting   Data Reviewed: CBC: Recent Labs  Lab 09/04/17 1354 09/05/17 0608 09/06/17 0508 09/07/17 0518 09/08/17 0549 09/09/17 0536  WBC 10.4 8.4 8.5 5.9 9.0 5.8  NEUTROABS 9.5*  --   --   --   --   --   HGB 15.0 13.4 13.3 13.0 9.9* 9.5*  HCT 42.6 39.6 40.0 39.2 29.6* 28.2*  MCV 95.7 98.3 97.3 96.6 96.4 97.2  PLT 132* 115* 92* 97* 91* 275*   Basic Metabolic Panel: Recent Labs  Lab 09/05/17 0608 09/06/17 0508 09/07/17 0518  09/08/17 0549 09/09/17 0536  NA 141 138 137 135 140  K 4.3 4.0 4.2 4.5 4.2  CL 107 103 103 101 104  CO2 28 25 23 23 26   GLUCOSE 117* 135* 120* 135* 106*  BUN 30* 34* 39* 50* 57*  CREATININE 2.10* 2.05* 2.03* 1.99* 2.00*  CALCIUM 8.8* 8.9 8.8* 8.2* 8.1*   GFR: Estimated Creatinine Clearance: 33.8 mL/min (A) (by C-G formula based on SCr of 2 mg/dL (H)). Liver Function Tests: No results for input(s): AST, ALT, ALKPHOS, BILITOT, PROT, ALBUMIN in the last 168 hours. No results for input(s): LIPASE, AMYLASE in the last 168 hours. No results for input(s): AMMONIA in the last 168 hours. Coagulation Profile: Recent Labs  Lab 09/04/17 1354  INR 1.12   Cardiac Enzymes: No results for input(s): CKTOTAL, CKMB, CKMBINDEX, TROPONINI in the last 168 hours. BNP (last 3 results) No results for input(s): PROBNP in the last 8760 hours. HbA1C: No results for input(s): HGBA1C in the last 72 hours. CBG: Recent Labs  Lab 09/06/17 0724  GLUCAP 118*   Lipid Profile: No results for input(s): CHOL, HDL, LDLCALC, TRIG, CHOLHDL, LDLDIRECT in the last 72 hours. Thyroid Function Tests: No results for input(s): TSH, T4TOTAL, FREET4, T3FREE, THYROIDAB in the last 72 hours. Anemia Panel: No results for input(s): VITAMINB12, FOLATE, FERRITIN, TIBC, IRON, RETICCTPCT in the last 72 hours. Urine analysis:    Component Value Date/Time   COLORURINE YELLOW 09/03/2016 0831   APPEARANCEUR CLEAR 09/03/2016 0831   LABSPEC 1.015 09/03/2016 0831   PHURINE 6.0 09/03/2016 0831   GLUCOSEU NEGATIVE 09/03/2016 0831   HGBUR NEGATIVE 09/03/2016 0831   BILIRUBINUR NEGATIVE 09/03/2016 0831   KETONESUR NEGATIVE 09/03/2016 0831   PROTEINUR NEGATIVE 01/04/2012 0851   UROBILINOGEN 0.2 09/03/2016 0831   NITRITE NEGATIVE 09/03/2016 0831   LEUKOCYTESUR NEGATIVE 09/03/2016 0831   Sepsis Labs: @LABRCNTIP (procalcitonin:4,lacticidven:4)  ) Recent Results (from the past 240 hour(s))  MRSA PCR Screening     Status: None    Collection Time: 09/06/17 10:00 AM  Result Value Ref Range Status   MRSA by PCR NEGATIVE NEGATIVE Final    Comment:        The GeneXpert MRSA Assay (FDA approved for NASAL specimens only), is one component of a comprehensive MRSA colonization surveillance program. It is not intended to diagnose MRSA infection nor to guide or monitor treatment for MRSA infections. Performed at [facility], [place] [address] [place], [place] [address]       Studies: No results found.  Scheduled Meds: . apixaban  2.5 mg Oral BID  . chlorproMAZINE  10 mg Oral TID  . feeding supplement (ENSURE ENLIVE)  237 mL Oral BID BM  . polyethylene glycol  17 g Oral Daily  . senna  1 tablet Oral BID  . tamsulosin  0.4 mg Oral QPC breakfast  Continuous Infusions:    LOS: 5 days     Kayleen Memos, MD [facility] Pager [phone]  If 7PM-7AM, please contact night-coverage [email or web address] Password [email or web address] 09/09/2017, 2:59 PM

## 2017-09-09 NOTE — Progress Notes (Signed)
ANTICOAGULATION CONSULT NOTE - Initial Consult  Pharmacy Consult for apixaban dose change Indication: atrial fibrillation  Allergies  Allergen Reactions  . Acyclovir And Related     Patient Measurements: Height: 6\' 1"  (185.4 cm) Weight: 193 lb 9 oz (87.8 kg) IBW/kg (Calculated) : 79.9  Vital Signs: BP: 97/63 (06/04 0730) Pulse Rate: 79 (06/04 0730)  Labs: Recent Labs    09/07/17 0518 09/08/17 0549 09/09/17 0536  HGB 13.0 9.9* 9.5*  HCT 39.2 29.6* 28.2*  PLT 97* 91* 101*  CREATININE 2.03* 1.99* 2.00*    Estimated Creatinine Clearance: 33.8 mL/min (A) (by C-G formula based on SCr of 2 mg/dL (H)).   Medical History: Past Medical History:  Diagnosis Date  . Anxiety   . Bilateral renal cysts   . Cancer ([place])    renal mass  . Depression   . Diverticulosis of colon   . Erectile dysfunction   . Hematuria   . History of cerebral embolic infarction x2 CVA's ---  neurologist-  [name]--- per pt residual abnormal gait , does need cane   12-16-2011  right frontal embolic infarct and [id number] left frontal embolic infarct (per first MRI previous hemorrgaic cerebral ischemia)  . History of duodenal ulcer    1997  . History of Helicobacter pylori infection    1997  . History of hiatal hernia   . History of loop recorder    Current  . History of syncope    1997  and 2014  s/p  loop recorder  . Hyperlipidemia   . Hypertension   . Nocturia   . PAF (paroxysmal atrial fibrillation) [facility])    cardiologist-  dr [name]  . Renal cell cancer ([place]) 09/06/2016  . Right renal mass   . Status post placement of implantable loop recorder    07-06-2012  . Stroke [facility])    2013 2 mild strokes mild weakness bil sides  . Urgency of urination   . Wears hearing aid    right ear only   Assessment: Patient was taking apixaban 5 mg PO BID prior to admission for atrial fibrillation. Patient admitted with hip fracture and is POD#2 of repair. Patient has been on reduced dose of apixaban 2.5  mg PO BID post-op.   Consult entered to increase back to patient's home dose of apixaban 5 mg PO BID starting 6/5.  Plan:  Patient will receive apixaban 2.5 mg PO this evening. Apixaban dose has been increased to 5 mg PO BID starting tomorrow morning per consult. Pharmacy to sign off.   Theodis Shove, PharmD, BCPS Clinical Pharmacist Pager: [phone] 09/09/17 3:53 PM

## 2017-09-09 NOTE — Progress Notes (Signed)
Physical Therapy Treatment Patient Details Name: Jeffrey Hardy MRN: [id number] DOB: [birth year] Today's Date: 09/09/2017    History of Present Illness 82 yo male admitted with L hip fx after sustaining a fall at home. S/P L THA-direct anterior 6/2    PT Comments    POD # 1 pm session Assisted with amb to bathroom then in hallway to practice stairs with family.  Performed twice with one sitting rest break between.  Pt required 50% VC's on proper sequencing and safety.  Returned to room and assisted back to bed.  Instructed on walker use and safety.  Advised family to NOT allow pt to amb by self initially.  Advised on proper shower transfer and sequencing.  Addressed all mobility questions and activity level. Pt ready for D/C to home with family support.   Follow Up Recommendations  Home health PT;Supervision/Assistance - 24 hour     Equipment Recommendations  Rolling walker with 5" wheels;3in1 (PT)    Recommendations for Other Services       Precautions / Restrictions Precautions Precautions: Fall Restrictions Weight Bearing Restrictions: No LLE Weight Bearing: Weight bearing as tolerated    Mobility  Bed Mobility Overal bed mobility: Needs Assistance Bed Mobility: Sit to Supine     Supine to sit: Min guard;Min assist Sit to supine: Min assist   General bed mobility comments: assist L LE back to bed  Transfers Overall transfer level: Needs assistance Equipment used: Rolling walker (2 wheeled) Transfers: Sit to/from Stand Sit to Stand: Min assist         General transfer comment: from high bed.  Assist to rise and steady  Ambulation/Gait Ambulation/Gait assistance: Min assist Ambulation Distance (Feet): 25 Feet Assistive device: Rolling walker (2 wheeled) Gait Pattern/deviations: Step-to pattern Gait velocity: decreased   General Gait Details: decreased amb distance amb to bathroom then performed stairs.   Stairs Stairs: Yes Stairs assistance: Min  assist Stair Management: One rail Right;Step to pattern;Forwards;With walker Number of Stairs: 2 General stair comments: performed twice with daughter present for "hands on" instruction on proper tech and safety.  Pt required 50% VC's and one sitting rest break between activity.   Wheelchair Mobility    Modified Rankin (Stroke Patients Only)       Balance                                            Cognition Arousal/Alertness: Awake/alert Behavior During Therapy: WFL for tasks assessed/performed Overall Cognitive Status: Within Functional Limits for tasks assessed                                 General Comments: talkative      Exercises      General Comments        Pertinent Vitals/Pain Pain Assessment: Faces Faces Pain Scale: Hurts a little bit Pain Location: L hip  Pain Descriptors / Indicators: Sore Pain Intervention(s): Monitored during session;Repositioned;Ice applied    Home Living                      Prior Function            PT Goals (current goals can now be found in the care plan section) Progress towards PT goals: Progressing toward goals    Frequency  7X/week      PT Plan Current plan remains appropriate    Co-evaluation              AM-PAC PT "6 Clicks" Daily Activity  Outcome Measure  Difficulty turning over in bed (including adjusting bedclothes, sheets and blankets)?: A Lot Difficulty moving from lying on back to sitting on the side of the bed? : A Lot Difficulty sitting down on and standing up from a chair with arms (e.g., wheelchair, bedside commode, etc,.)?: A Lot Help needed moving to and from a bed to chair (including a wheelchair)?: A Lot Help needed walking in hospital room?: A Lot Help needed climbing 3-5 steps with a railing? : A Lot 6 Click Score: 12    End of Session Equipment Utilized During Treatment: Gait belt Activity Tolerance: Patient tolerated treatment  well Patient left: in chair;with call bell/phone within reach;with chair alarm set;with family/visitor present   PT Visit Diagnosis: Muscle weakness (generalized) (M62.81);Difficulty in walking, not elsewhere classified (R26.2)     Time: 8032-1224 PT Time Calculation (min) (ACUTE ONLY): 44 min  Charges:  $Gait Training: 23-37 mins $Therapeutic Exercise: 8-22 mins $Therapeutic Activity: 8-22 mins                    G Codes:       Rica Koyanagi  PTA [facility]  Acute  Rehab Pager      [phone]

## 2017-09-10 DIAGNOSIS — S72002A Fracture of unspecified part of neck of left femur, initial encounter for closed fracture: Secondary | ICD-10-CM

## 2017-09-10 LAB — COMPREHENSIVE METABOLIC PANEL
ALT: 10 U/L — ABNORMAL LOW (ref 17–63)
AST: 20 U/L (ref 15–41)
Albumin: 3.2 g/dL — ABNORMAL LOW (ref 3.5–5.0)
Alkaline Phosphatase: 40 U/L (ref 38–126)
Anion gap: 7 (ref 5–15)
BUN: 52 mg/dL — ABNORMAL HIGH (ref 6–20)
CO2: 30 mmol/L (ref 22–32)
Calcium: 8.3 mg/dL — ABNORMAL LOW (ref 8.9–10.3)
Chloride: 103 mmol/L (ref 101–111)
Creatinine, Ser: 1.84 mg/dL — ABNORMAL HIGH (ref 0.61–1.24)
GFR calc Af Amer: 38 mL/min — ABNORMAL LOW (ref >60)
GFR calc non Af Amer: 33 mL/min — ABNORMAL LOW (ref >60)
Glucose, Bld: 112 mg/dL — ABNORMAL HIGH (ref 65–99)
Potassium: 4.3 mmol/L (ref 3.5–5.1)
Sodium: 140 mmol/L (ref 135–145)
Total Bilirubin: 0.6 mg/dL (ref 0.3–1.2)
Total Protein: 5.5 g/dL — ABNORMAL LOW (ref 6.5–8.1)

## 2017-09-10 LAB — CBC
HCT: 28.3 % — ABNORMAL LOW (ref 39.0–52.0)
Hemoglobin: 9.5 g/dL — ABNORMAL LOW (ref 13.0–17.0)
MCH: 32.5 pg (ref 26.0–34.0)
MCHC: 33.6 g/dL (ref 30.0–36.0)
MCV: 96.9 fL (ref 78.0–100.0)
Platelets: 121 10*3/uL — ABNORMAL LOW (ref 150–400)
RBC: 2.92 MIL/uL — ABNORMAL LOW (ref 4.22–5.81)
RDW: 13.2 % (ref 11.5–15.5)
WBC: 6 10*3/uL (ref 4.0–10.5)

## 2017-09-10 LAB — MAGNESIUM: Magnesium: 2.3 mg/dL (ref 1.7–2.4)

## 2017-09-10 NOTE — Progress Notes (Signed)
Patient discharged to home with family. Given all belongings, instructions, prescriptions. DTR present for all teaching. Both verbalized understanding of instructions. Escorted to pov via w/c.

## 2017-09-10 NOTE — Progress Notes (Signed)
Occupational Therapy Treatment Patient Details Name: Jeffrey Hardy MRN: [id number] DOB: [birth year] Today's Date: 09/10/2017    History of present illness 82 yo male admitted with L hip fx after sustaining a fall at home. S/P L THA-direct anterior 6/2   OT comments  Practiced toilet transfer and educated on AE and shower transfer  Follow Up Recommendations  Supervision/Assistance - 24 hour    Equipment Recommendations  3 in 1 bedside commode    Recommendations for Other Services      Precautions / Restrictions Precautions Precautions: Fall Restrictions Weight Bearing Restrictions: No LLE Weight Bearing: Weight bearing as tolerated       Mobility Bed Mobility               General bed mobility comments: oob  Transfers   Equipment used: Rolling walker (2 wheeled)   Sit to Stand: Min assist         General transfer comment: assist to rise and steady.  from recliner and 3:1 commode    Balance                                           ADL either performed or assessed with clinical judgement   ADL                           Toilet Transfer: Minimal assistance;Ambulation;BSC;RW             General ADL Comments: ambulated to bathroom and demonstrated shower transfer.  Pt did not want to practice as he feels he will sponge bathe initially.  Gave him a handout.  Pt has a reacher at home but he plans to have his wife assist as needed     Vision       Perception     Praxis      Cognition Arousal/Alertness: Awake/alert Behavior During Therapy: WFL for tasks assessed/performed Overall Cognitive Status: Within Functional Limits for tasks assessed                                          Exercises     Shoulder Instructions       General Comments      Pertinent Vitals/ Pain       Pain Score: 1  Pain Location: L hip  Pain Descriptors / Indicators: Discomfort Pain Intervention(s): Limited activity  within patient's tolerance;Monitored during session;Repositioned  Home Living                                          Prior Functioning/Environment              Frequency           Progress Toward Goals  OT Goals(current goals can now be found in the care plan section)  Progress towards OT goals: Progressing toward goals     Plan      Co-evaluation                 AM-PAC PT "6 Clicks" Daily Activity     Outcome Measure   Help from another person eating meals?: None Help from another  person taking care of personal grooming?: A Little Help from another person toileting, which includes using toliet, bedpan, or urinal?: A Little Help from another person bathing (including washing, rinsing, drying)?: A Lot Help from another person to put on and taking off regular upper body clothing?: A Little Help from another person to put on and taking off regular lower body clothing?: A Lot 6 Click Score: 17    End of Session    OT Visit Diagnosis: Pain Pain - Right/Left: Left Pain - part of body: Hip   Activity Tolerance Patient tolerated treatment well   Patient Left in chair;with call bell/phone within reach;with chair alarm set   Nurse Communication          Time: 1610-9604 OT Time Calculation (min): 27 min  Charges: OT General Charges $OT Visit: 1 Visit OT Treatments $Self Care/Home Management : 23-37 mins  [name], OTR/L [phone] 09/10/2017   [place] 09/10/2017, 12:17 PM

## 2017-09-10 NOTE — Discharge Summary (Signed)
Physician Discharge Summary  Jeffrey Hardy WJX:[id number] DOB: 1935/04/15 DOA: 09/04/2017  PCP: Biagio Borg, MD  Admit date: 09/04/2017 Discharge date: 09/10/2017  Time spent:  35 minutes  Recommendations for Outpatient Follow-up:  1. Please be sure to follow up with your orthopaedic surgeon   Discharge Diagnoses:  Principal Problem:   Closed left hip fracture, initial encounter [facility]) Active Problems:   Essential hypertension   Thrombocytopenia ([place])   Cerebrovascular disease, arteriosclerotic, post-stroke   Renal cell cancer ([place])   CKD (chronic kidney disease) stage 3, GFR 30-59 ml/min (HCC)   Hip fracture ([place])   Closed displaced fracture of left femoral neck ([place])   Discharge Condition: stable  Diet recommendation: Heart healthy  Filed Weights   09/04/17 1230 09/04/17 1838  Weight: 89.4 kg (197 lb) 87.8 kg (193 lb 9 oz)    History of present illness:  Jeffrey Hardy a 82 y.o.malewithhistory of atrial fibrillation on Eliquis, hypertension, chronic kidney disease stage III, history of renal cell carcinoma status post right-sided nephrectomy had a fall at home while walking in his barn.Patient fell onto his hip and since it was hurting came to the ER.    ER x-rays revealed left hip fracture  Hospital Course:  Left hip Fracture POD#2 post left hip repair Ortho consulted and operated on patient. Was last seen in house by Dr. [name] C/w PT as recommended by ortho  Otherwise for prior to admission medical problems listed above continue prior to admission medication regimen with the exception of lisinopril for htn due to soft blood pressures off of it and elevated serum creatinine 2ary to CKD   Procedures:  Please refer to EMR for details  Consultations:  Ortho: Dr. [name]  Discharge Exam: Vitals:   09/09/17 2101 09/10/17 0504  BP: 115/76 109/60  Pulse: 77 76  Resp: 18 18  Temp: 98.3 F (36.8 C) 98 F (36.7 C)  SpO2: 100% 100%    General: Pt in  nad, alert and awake Cardiovascular: s1 and s2 present, no cyanosis Respiratory: no increased wob, no wheezes  Discharge Instructions   Discharge Instructions    Diet - low sodium heart healthy   Complete by:  As directed    Increase activity slowly   Complete by:  As directed      Allergies as of 09/10/2017      Reactions   Acyclovir And Related       Medication List    STOP taking these medications   lisinopril 5 MG tablet Commonly known as:  PRINIVIL,ZESTRIL     TAKE these medications   apixaban 5 MG Tabs tablet Commonly known as:  ELIQUIS Take 1 tablet (5 mg total) by mouth 2 (two) times daily.   cholecalciferol 1000 units tablet Commonly known as:  VITAMIN D Take 1,000 Units by mouth at bedtime.   HYDROcodone-acetaminophen 5-325 MG tablet Commonly known as:  NORCO/VICODIN Take 1-2 tablets by mouth every 6 (six) hours as needed for moderate pain.   tamsulosin 0.4 MG Caps capsule Commonly known as:  FLOMAX Take 1 capsule (0.4 mg total) by mouth daily after breakfast.            Durable Medical Equipment  (From admission, onward)        Start     Ordered   09/10/17 1314  DME 3-in-1  Once     09/10/17 1313   09/09/17 1121  For home use only DME Walker rolling  Once    Question:  Patient needs a walker to treat with the following condition  Answer:  S/P hip hemiarthroplasty   09/09/17 1120   09/09/17 1121  For home use only DME 3 n 1  Once     09/09/17 1120     Allergies  Allergen Reactions  . Acyclovir And Related    Follow-up Information    Swinteck, Aaron Edelman, MD. Schedule an appointment as soon as possible for a visit in 2 weeks.   Specialty:  Orthopedic Surgery Why:  For wound re-check Contact information: [address] [place] [address] [phone]        [facility] - Dme Follow up.   Why:  walker and 3n1 Contact information: [address] [place] [address] [phone]        Health,  [facility]-Home Follow up.   Specialty:  [place] Why:  physical therapy Contact information: [address] [place] [address] [phone]            The results of significant diagnostics from this hospitalization (including imaging, microbiology, ancillary and laboratory) are listed below for reference.    Significant Diagnostic Studies: Dg Chest 1 View  Result Date: 09/04/2017 CLINICAL DATA:  Preop left hip fracture EXAM: CHEST  1 VIEW COMPARISON:  03/07/2017 FINDINGS: The heart size and mediastinal contours are within normal limits. Both lungs are clear. The visualized skeletal structures are unremarkable. IMPRESSION: No active disease. Electronically Signed   By: Kathreen Devoid   On: 09/04/2017 13:58   Pelvis Portable  Result Date: 09/07/2017 CLINICAL DATA:  Status post left total hip replacement today. EXAM: PORTABLE PELVIS 1-2 VIEWS COMPARISON:  Intraoperative imaging earlier today. FINDINGS: Left total hip arthroplasty is in place. The device is located. No fracture or other acute abnormality. Soft tissue gas surgery noted. IMPRESSION: Status post left hip replacement.  No acute finding. Electronically Signed   By: Inge Rise M.D.   On: 09/07/2017 10:50   Dg C-arm 1-60 Min-no Report  Result Date: 09/07/2017 Fluoroscopy was utilized by the requesting physician.  No radiographic interpretation.   Dg Hip Operative Unilat W Or W/o Pelvis Left  Result Date: 09/07/2017 CLINICAL DATA:  Total hip replacement FLUOROSCOPY TIME:  22 seconds. Images: 4 EXAM: OPERATIVE LEFT HIP (WITH PELVIS IF PERFORMED) 4 VIEWS TECHNIQUE: Fluoroscopic spot image(s) were submitted for interpretation post-operatively. COMPARISON:  None. FINDINGS: The patient is status post left hip replacement. Hardware is in good position by the end of the study. IMPRESSION: Left hip replacement as above. Electronically Signed   By: Dorise Bullion III M.D   On: 09/07/2017 09:57   Dg Hip  Unilat With Pelvis 2-3 Views Left  Result Date: 09/04/2017 CLINICAL DATA:  [place] [address] today and landed on lt hip. Lt hip pain. EXAM: DG HIP (WITH OR WITHOUT PELVIS) 2-3V LEFT COMPARISON:  None. FINDINGS: There is an acute fracture of the LEFT femoral neck, associated with impaction and varus angulation. Degenerative changes are seen in both hips. Degenerative changes are seen in the LOWER lumbar spine. IMPRESSION: Acute fracture of the LEFT femoral neck. Electronically Signed   By: Nolon Nations M.D.   On: 09/04/2017 13:17    Microbiology: Recent Results (from the past 240 hour(s))  MRSA PCR Screening     Status: None   Collection Time: 09/06/17 10:00 AM  Result Value Ref Range Status   MRSA by PCR NEGATIVE NEGATIVE Final    Comment:  The GeneXpert MRSA Assay (FDA approved for NASAL specimens only), is one component of a comprehensive MRSA colonization surveillance program. It is not intended to diagnose MRSA infection nor to guide or monitor treatment for MRSA infections. Performed at [facility], [place] [address], [place], [place] [address]      Labs: Basic Metabolic Panel: Recent Labs  Lab 09/06/17 0508 09/07/17 0518 09/08/17 0549 09/09/17 0536 09/10/17 0512  NA 138 137 135 140 140  K 4.0 4.2 4.5 4.2 4.3  CL 103 103 101 104 103  CO2 25 23 23 26 30   GLUCOSE 135* 120* 135* 106* 112*  BUN 34* 39* 50* 57* 52*  CREATININE 2.05* 2.03* 1.99* 2.00* 1.84*  CALCIUM 8.9 8.8* 8.2* 8.1* 8.3*  MG  --   --   --   --  2.3   Liver Function Tests: Recent Labs  Lab 09/10/17 0512  AST 20  ALT 10*  ALKPHOS 40  BILITOT 0.6  PROT 5.5*  ALBUMIN 3.2*   No results for input(s): LIPASE, AMYLASE in the last 168 hours. No results for input(s): AMMONIA in the last 168 hours. CBC: Recent Labs  Lab 09/04/17 1354  09/06/17 0508 09/07/17 0518 09/08/17 0549 09/09/17 0536 09/10/17 0512  WBC 10.4   < > 8.5 5.9 9.0 5.8 6.0  NEUTROABS 9.5*  --   --   --   --   --    --   HGB 15.0   < > 13.3 13.0 9.9* 9.5* 9.5*  HCT 42.6   < > 40.0 39.2 29.6* 28.2* 28.3*  MCV 95.7   < > 97.3 96.6 96.4 97.2 96.9  PLT 132*   < > 92* 97* 91* 101* 121*   < > = values in this interval not displayed.   Cardiac Enzymes: No results for input(s): CKTOTAL, CKMB, CKMBINDEX, TROPONINI in the last 168 hours. BNP: BNP (last 3 results) No results for input(s): BNP in the last 8760 hours.  ProBNP (last 3 results) No results for input(s): PROBNP in the last 8760 hours.  CBG: Recent Labs  Lab 09/06/17 0724  GLUCAP 118*     Signed:  Velvet Bathe MD.  [facility] 09/10/2017, 1:13 PM

## 2017-09-10 NOTE — Progress Notes (Signed)
Physical Therapy Treatment Patient Details Name: Jeffrey Hardy MRN: [id number] DOB: 05-17-1935 Today's Date: 09/10/2017    History of Present Illness 82 yo male admitted with L hip fx after sustaining a fall at home. S/P L THA-direct anterior 6/2    PT Comments    POD # 2 am session Pt OOB in recliner with daughter in room. General transfer comment: assist to rise and steady.  from recliner Instructed to increase forward flex to decrease posterior LOB General Gait Details: tolerated an increased distance.  Amb with daughter "hands on" with instruction on safe handling and safety with turns  General stair comments: performed with daughter "hands on" with 25% VC's on proper tech and safety.  Pt has HEP handout and instructed on use of ICE.    Follow Up Recommendations  Home health PT;Supervision/Assistance - 24 hour     Equipment Recommendations  Rolling walker with 5" wheels;3in1 (PT)    Recommendations for Other Services       Precautions / Restrictions Precautions Precautions: Fall Restrictions Weight Bearing Restrictions: No LLE Weight Bearing: Weight bearing as tolerated    Mobility  Bed Mobility               General bed mobility comments: OOB in recliner  Transfers Overall transfer level: Needs assistance Equipment used: Rolling walker (2 wheeled) Transfers: Sit to/from Stand Sit to Stand: Supervision;Min guard         General transfer comment: assist to rise and steady.  from recliner Instructed to increase forward flex to decrease posterior LOB  Ambulation/Gait Ambulation/Gait assistance: Min guard Ambulation Distance (Feet): 75 Feet Assistive device: Rolling walker (2 wheeled) Gait Pattern/deviations: Step-to pattern Gait velocity: decreased   General Gait Details: tolerated an increased distance.  Amb with daughter "hands on" with instruction on safe handling and safety with turns   Stairs Stairs: Yes Stairs assistance: Min assist Stair  Management: One rail Right;Step to pattern;Forwards;With walker Number of Stairs: 2 General stair comments: performed with daughter "hands on" with 25% VC's on proper tech and safety.     Wheelchair Mobility    Modified Rankin (Stroke Patients Only)       Balance                                            Cognition Arousal/Alertness: Awake/alert Behavior During Therapy: WFL for tasks assessed/performed Overall Cognitive Status: Within Functional Limits for tasks assessed                                 General Comments: talkative      Exercises      General Comments        Pertinent Vitals/Pain Pain Assessment: 0-10 Pain Score: 2  Pain Location: L hip  Pain Descriptors / Indicators: Discomfort;Operative site guarding Pain Intervention(s): Monitored during session;Repositioned;Ice applied    Home Living                      Prior Function            PT Goals (current goals can now be found in the care plan section) Progress towards PT goals: Progressing toward goals    Frequency    7X/week      PT Plan Current plan remains appropriate  Co-evaluation              AM-PAC PT "6 Clicks" Daily Activity  Outcome Measure  Difficulty turning over in bed (including adjusting bedclothes, sheets and blankets)?: A Lot Difficulty moving from lying on back to sitting on the side of the bed? : A Lot Difficulty sitting down on and standing up from a chair with arms (e.g., wheelchair, bedside commode, etc,.)?: A Lot Help needed moving to and from a bed to chair (including a wheelchair)?: A Lot Help needed walking in hospital room?: A Lot Help needed climbing 3-5 steps with a railing? : A Lot 6 Click Score: 12    End of Session Equipment Utilized During Treatment: Gait belt Activity Tolerance: Patient tolerated treatment well Patient left: in chair;with call bell/phone within reach;with chair alarm set;with  family/visitor present Nurse Communication: Mobility status(pt ready for D/C to home) PT Visit Diagnosis: Muscle weakness (generalized) (M62.81);Difficulty in walking, not elsewhere classified (R26.2)     Time: 0370-4888 PT Time Calculation (min) (ACUTE ONLY): 28 min  Charges:  $Gait Training: 8-22 mins $Therapeutic Activity: 8-22 mins                    G Codes:       Rica Koyanagi  PTA [facility]  Acute  Rehab Pager      [phone]

## 2017-09-10 NOTE — Progress Notes (Signed)
    Subjective:  Patient reports pain as mild.  Denies N/V/CP/SOB. No c/o.  Objective:   VITALS:   Vitals:   09/09/17 0730 09/09/17 1435 09/09/17 2101 09/10/17 0504  BP: 97/63 108/69 115/76 109/60  Pulse: 79 76 77 76  Resp:  18 18 18   Temp:  98.1 F (36.7 C) 98.3 F (36.8 C) 98 F (36.7 C)  TempSrc:  Oral Oral Oral  SpO2:  99% 100% 100%  Weight:      Height:        NAD ABD soft Sensation intact distally Intact pulses distally Dorsiflexion/Plantar flexion intact Incision: dressing C/D/I Compartment soft   Lab Results  Component Value Date   WBC 6.0 09/10/2017   HGB 9.5 (L) 09/10/2017   HCT 28.3 (L) 09/10/2017   MCV 96.9 09/10/2017   PLT 121 (L) 09/10/2017   BMET    Component Value Date/Time   NA 140 09/10/2017 0512   K 4.3 09/10/2017 0512   CL 103 09/10/2017 0512   CO2 30 09/10/2017 0512   GLUCOSE 112 (H) 09/10/2017 0512   BUN 52 (H) 09/10/2017 0512   CREATININE 1.84 (H) 09/10/2017 0512   CALCIUM 8.3 (L) 09/10/2017 0512   GFRNONAA 33 (L) 09/10/2017 0512   GFRAA 38 (L) 09/10/2017 0512     Assessment/Plan: 3 Days Post-Op   Principal Problem:   Closed left hip fracture, initial encounter (HCC) Active Problems:   Essential hypertension   Thrombocytopenia (HCC)   Cerebrovascular disease, arteriosclerotic, post-stroke   Renal cell cancer (HCC)   CKD (chronic kidney disease) stage 3, GFR 30-59 ml/min (HCC)   Hip fracture (HCC)   Closed displaced fracture of left femoral neck (HCC)   WBAT with walker DVT ppx: apixaban, SCDs, TEDS PO pain control PT/OT Dispo: D/C home with HHPT   Hilton Cork Rylynne Schicker 09/10/2017, 1:36 PM   Rod Can, MD Cell [phone]

## 2017-09-11 ENCOUNTER — Telehealth: Payer: Self-pay | Admitting: *Deleted

## 2017-09-11 ENCOUNTER — Encounter: Payer: PPO | Admitting: Internal Medicine

## 2017-09-11 DIAGNOSIS — R269 Unspecified abnormalities of gait and mobility: Secondary | ICD-10-CM | POA: Diagnosis not present

## 2017-09-11 DIAGNOSIS — H9191 Unspecified hearing loss, right ear: Secondary | ICD-10-CM | POA: Diagnosis not present

## 2017-09-11 DIAGNOSIS — R531 Weakness: Secondary | ICD-10-CM | POA: Diagnosis not present

## 2017-09-11 DIAGNOSIS — I1310 Hypertensive heart and chronic kidney disease without heart failure, with stage 1 through stage 4 chronic kidney disease, or unspecified chronic kidney disease: Secondary | ICD-10-CM | POA: Diagnosis not present

## 2017-09-11 DIAGNOSIS — Z96642 Presence of left artificial hip joint: Secondary | ICD-10-CM | POA: Diagnosis not present

## 2017-09-11 DIAGNOSIS — F329 Major depressive disorder, single episode, unspecified: Secondary | ICD-10-CM | POA: Diagnosis not present

## 2017-09-11 DIAGNOSIS — Z7901 Long term (current) use of anticoagulants: Secondary | ICD-10-CM | POA: Diagnosis not present

## 2017-09-11 DIAGNOSIS — F419 Anxiety disorder, unspecified: Secondary | ICD-10-CM | POA: Diagnosis not present

## 2017-09-11 DIAGNOSIS — E785 Hyperlipidemia, unspecified: Secondary | ICD-10-CM | POA: Diagnosis not present

## 2017-09-11 DIAGNOSIS — D696 Thrombocytopenia, unspecified: Secondary | ICD-10-CM | POA: Diagnosis not present

## 2017-09-11 DIAGNOSIS — Z85528 Personal history of other malignant neoplasm of kidney: Secondary | ICD-10-CM | POA: Diagnosis not present

## 2017-09-11 DIAGNOSIS — Z87891 Personal history of nicotine dependence: Secondary | ICD-10-CM | POA: Diagnosis not present

## 2017-09-11 DIAGNOSIS — S72002D Fracture of unspecified part of neck of left femur, subsequent encounter for closed fracture with routine healing: Secondary | ICD-10-CM | POA: Diagnosis not present

## 2017-09-11 DIAGNOSIS — Z95818 Presence of other cardiac implants and grafts: Secondary | ICD-10-CM | POA: Diagnosis not present

## 2017-09-11 DIAGNOSIS — I69398 Other sequelae of cerebral infarction: Secondary | ICD-10-CM | POA: Diagnosis not present

## 2017-09-11 DIAGNOSIS — M1611 Unilateral primary osteoarthritis, right hip: Secondary | ICD-10-CM | POA: Diagnosis not present

## 2017-09-11 DIAGNOSIS — N183 Chronic kidney disease, stage 3 (moderate): Secondary | ICD-10-CM | POA: Diagnosis not present

## 2017-09-11 DIAGNOSIS — W19XXXD Unspecified fall, subsequent encounter: Secondary | ICD-10-CM | POA: Diagnosis not present

## 2017-09-11 DIAGNOSIS — I480 Paroxysmal atrial fibrillation: Secondary | ICD-10-CM | POA: Diagnosis not present

## 2017-09-11 DIAGNOSIS — Z7982 Long term (current) use of aspirin: Secondary | ICD-10-CM | POA: Diagnosis not present

## 2017-09-11 DIAGNOSIS — I080 Rheumatic disorders of both mitral and aortic valves: Secondary | ICD-10-CM | POA: Diagnosis not present

## 2017-09-11 NOTE — Telephone Encounter (Signed)
Pt was on TCM report admitted 09/04/17 patient fell onto his hip and c/o of pain. Xray was done which showed closed left hip fracture. Post left hip repair and was D/C 09/10/17, and will f/u w/Dr. [name] in 2 weeks.Marland KitchenJohny Hardy

## 2017-09-17 ENCOUNTER — Ambulatory Visit: Payer: Self-pay | Admitting: *Deleted

## 2017-09-17 NOTE — Telephone Encounter (Signed)
Patient had Left total hip replacement on June 2nd. Home health PT is seeing patient at this time. Upon b/p check, 98/58 and pulse 74 97 % RA. Cecilie Lowers, PT reports Jeffrey Hardy normal b/p is around 110/58. Patient denies dizziness/feeling faint/CP/vision changes. Had therapist ambulate with patient, walked approximately 80 feet and patient is asymptomatic afterwards with b/p 110/58,  HR 74. Patient does say he has been more fatigued over the last 2 days. PT will be visiting again on Friday. Ortho is Dr. Kris Mouton has an appointment with him on Monday 6/17 and was told not to ride in a car until then. Wife states he is on Lisinopril 5 MG daily.  Please Advise.   Reason for Disposition . [5] Systolic BP 80-998 AND [3] taking blood pressure medications AND [3] NOT dizzy, lightheaded or weak . Major surgery in the past month  Answer Assessment - Initial Assessment Questions 1. BLOOD PRESSURE: "What is the blood pressure?" "Did you take at least two measurements 5 minutes apart?"     98/58 pulse 74,  94/54, 74 2. ONSET: "When did you take your blood pressure?"     Just now by home health PT 3. HOW: "How did you obtain the blood pressure?" (e.g., visiting nurse, automatic home BP monitor)     Home health PT 4. HISTORY: "Do you have a history of low blood pressure?" "What is your blood pressure normally?"     106/60 on Monday's visit  5. MEDICATIONS: "Are you taking any medications for blood pressure?" If yes: "Have they been changed recently?"     Yes and no changes 6. PULSE RATE: "Do you know what your pulse rate is?"      74 7. OTHER SYMPTOMS: "Have you been sick recently?" "Have you had a recent injury?"     June 2nd Left total hip replacement. 8. PREGNANCY: "Is there any chance you are pregnant?" "When was your last menstrual period?"     na  Protocols used: LOW BLOOD PRESSURE-A-AH

## 2017-09-17 NOTE — Telephone Encounter (Signed)
Jeffrey Hardy has been informed and will speka to the patient and his wife. He stated that the wife has been doing "a really good job" of checking his BP's and recording them. He stated that he informed her to check it every 2 hours.

## 2017-09-17 NOTE — Telephone Encounter (Signed)
Ok to continue to monitor Home BP and call for recurring BP < 100/80 especially if dizziness or weakness worsens

## 2017-09-18 ENCOUNTER — Ambulatory Visit: Payer: Self-pay | Admitting: *Deleted

## 2017-09-18 NOTE — Telephone Encounter (Signed)
Ok to continue to monitor

## 2017-09-18 NOTE — Telephone Encounter (Signed)
Wife called because pt'sb/p has been lower than normal. This morning he was 94/62 and 98/65. She said his bp is normally around 114-117 over 60's. The only symptoms when his bp is low is he feels like he needs to sleep. He denies dizziness, h/a, nausea. He is well hydrated and is able to work with the Physical therapist. He had hip surgery and can not ride in a car right now according to his wife. So she wants to know if she can give a half dose of the lisinopril 5 mg tab.  In looking at his last admission to the hospital, according to the d/c summary: it states that he should stop this medication.  So does he need to continue taking the lisinopril? Will route this to flow at [facility]. Wife is requesting a call back. Advised to call back is symptoms increase.  Reason for Disposition . Caller has URGENT medication question about med that PCP prescribed and triager unable to answer question  Answer Assessment - Initial Assessment Questions 1. SYMPTOMS: "Do you have any symptoms?"     Low b/p with a symptom of only wanting to sleep 2. SEVERITY: If symptoms are present, ask "Are they mild, moderate or severe?"     Mild to moderate  Protocols used: MEDICATION QUESTION CALL-A-AH

## 2017-09-18 NOTE — Telephone Encounter (Signed)
Talked with patient's wife---patient is asymptomatic except for being sleepy---I have reviewed discharge summary patient takes home from hospital and it does say that patient should STOP taking 5mg  lisinopril---I have advised wife to stop this medication---and she will need to continue monitoring bp readings x3 daily---if bp gets too high (above 140/90), she needs to call our office to get advisement and she needs to make follow up appt to see patient's pcp just as soon as patient is able to come to review meds---Mrs. Almquist repeated back for understanding--she will call back to schedule follow up appt to see dr Jenny Reichmann

## 2017-09-18 NOTE — Telephone Encounter (Signed)
Please advise 

## 2017-09-22 DIAGNOSIS — Z96642 Presence of left artificial hip joint: Secondary | ICD-10-CM | POA: Diagnosis not present

## 2017-09-22 DIAGNOSIS — S72032D Displaced midcervical fracture of left femur, subsequent encounter for closed fracture with routine healing: Secondary | ICD-10-CM | POA: Diagnosis not present

## 2017-09-22 DIAGNOSIS — Z471 Aftercare following joint replacement surgery: Secondary | ICD-10-CM | POA: Diagnosis not present

## 2017-10-03 ENCOUNTER — Other Ambulatory Visit: Payer: Self-pay | Admitting: Internal Medicine

## 2017-10-13 DIAGNOSIS — L602 Onychogryphosis: Secondary | ICD-10-CM | POA: Diagnosis not present

## 2017-10-13 DIAGNOSIS — M79671 Pain in right foot: Secondary | ICD-10-CM | POA: Diagnosis not present

## 2017-10-13 DIAGNOSIS — M79672 Pain in left foot: Secondary | ICD-10-CM | POA: Diagnosis not present

## 2017-10-13 DIAGNOSIS — L84 Corns and callosities: Secondary | ICD-10-CM | POA: Diagnosis not present

## 2017-10-21 DIAGNOSIS — Z471 Aftercare following joint replacement surgery: Secondary | ICD-10-CM | POA: Diagnosis not present

## 2017-10-21 DIAGNOSIS — S72032D Displaced midcervical fracture of left femur, subsequent encounter for closed fracture with routine healing: Secondary | ICD-10-CM | POA: Diagnosis not present

## 2017-10-21 DIAGNOSIS — Z96642 Presence of left artificial hip joint: Secondary | ICD-10-CM | POA: Diagnosis not present

## 2017-10-23 ENCOUNTER — Ambulatory Visit (HOSPITAL_COMMUNITY)
Admission: RE | Admit: 2017-10-23 | Discharge: 2017-10-23 | Disposition: A | Discharge status: Home / Self Care | Payer: PPO | Source: Ambulatory Visit | Attending: Urology | Admitting: Urology

## 2017-10-23 ENCOUNTER — Other Ambulatory Visit: Payer: Self-pay | Admitting: Urology

## 2017-10-23 DIAGNOSIS — C641 Malignant neoplasm of right kidney, except renal pelvis: Secondary | ICD-10-CM | POA: Insufficient documentation

## 2017-10-23 DIAGNOSIS — C649 Malignant neoplasm of unspecified kidney, except renal pelvis: Secondary | ICD-10-CM | POA: Diagnosis not present

## 2017-10-30 DIAGNOSIS — N3941 Urge incontinence: Secondary | ICD-10-CM | POA: Diagnosis not present

## 2017-10-30 DIAGNOSIS — C641 Malignant neoplasm of right kidney, except renal pelvis: Secondary | ICD-10-CM | POA: Diagnosis not present

## 2017-10-30 DIAGNOSIS — N401 Enlarged prostate with lower urinary tract symptoms: Secondary | ICD-10-CM | POA: Diagnosis not present

## 2017-11-03 ENCOUNTER — Other Ambulatory Visit (INDEPENDENT_AMBULATORY_CARE_PROVIDER_SITE_OTHER): Payer: PPO

## 2017-11-03 ENCOUNTER — Encounter: Payer: Self-pay | Admitting: Internal Medicine

## 2017-11-03 ENCOUNTER — Ambulatory Visit (INDEPENDENT_AMBULATORY_CARE_PROVIDER_SITE_OTHER): Payer: PPO | Admitting: Internal Medicine

## 2017-11-03 VITALS — BP 128/80 | Temp 97.6°F | Ht 73.0 in | Wt 199.0 lb

## 2017-11-03 DIAGNOSIS — I10 Essential (primary) hypertension: Secondary | ICD-10-CM

## 2017-11-03 DIAGNOSIS — R739 Hyperglycemia, unspecified: Secondary | ICD-10-CM

## 2017-11-03 DIAGNOSIS — N183 Chronic kidney disease, stage 3 unspecified: Secondary | ICD-10-CM

## 2017-11-03 DIAGNOSIS — Z0000 Encounter for general adult medical examination without abnormal findings: Secondary | ICD-10-CM

## 2017-11-03 DIAGNOSIS — Z8781 Personal history of (healed) traumatic fracture: Secondary | ICD-10-CM

## 2017-11-03 DIAGNOSIS — S72002A Fracture of unspecified part of neck of left femur, initial encounter for closed fracture: Secondary | ICD-10-CM | POA: Diagnosis not present

## 2017-11-03 LAB — URINALYSIS, ROUTINE W REFLEX MICROSCOPIC
Bilirubin Urine: NEGATIVE
Hgb urine dipstick: NEGATIVE
Ketones, ur: NEGATIVE
Leukocytes, UA: NEGATIVE
Nitrite: NEGATIVE
RBC / HPF: NONE SEEN (ref 0–?)
Specific Gravity, Urine: 1.015 (ref 1.000–1.030)
Total Protein, Urine: NEGATIVE
Urine Glucose: NEGATIVE
Urobilinogen, UA: 0.2 (ref 0.0–1.0)
pH: 6.5 (ref 5.0–8.0)

## 2017-11-03 LAB — HEPATIC FUNCTION PANEL
ALT: 11 U/L (ref 0–53)
AST: 11 U/L (ref 0–37)
Albumin: 4.4 g/dL (ref 3.5–5.2)
Alkaline Phosphatase: 76 U/L (ref 39–117)
Bilirubin, Direct: 0.1 mg/dL (ref 0.0–0.3)
Total Bilirubin: 0.4 mg/dL (ref 0.2–1.2)
Total Protein: 6.8 g/dL (ref 6.0–8.3)

## 2017-11-03 LAB — BASIC METABOLIC PANEL
BUN: 28 mg/dL — ABNORMAL HIGH (ref 6–23)
CO2: 29 mEq/L (ref 19–32)
Calcium: 9.7 mg/dL (ref 8.4–10.5)
Chloride: 102 mEq/L (ref 96–112)
Creatinine, Ser: 2.12 mg/dL — ABNORMAL HIGH (ref 0.40–1.50)
GFR: 32.13 mL/min — ABNORMAL LOW (ref >60.00)
Glucose, Bld: 97 mg/dL (ref 70–99)
Potassium: 4.3 mEq/L (ref 3.5–5.1)
Sodium: 141 mEq/L (ref 135–145)

## 2017-11-03 LAB — TSH: TSH: 1.94 u[IU]/mL (ref 0.35–4.50)

## 2017-11-03 LAB — HEMOGLOBIN A1C: Hgb A1c MFr Bld: 5.1 % (ref 4.6–6.5)

## 2017-11-03 LAB — CBC WITH DIFFERENTIAL/PLATELET
Basophils Absolute: 0 10*3/uL (ref 0.0–0.1)
Basophils Relative: 0.9 % (ref 0.0–3.0)
Eosinophils Absolute: 0.1 10*3/uL (ref 0.0–0.7)
Eosinophils Relative: 2.6 % (ref 0.0–5.0)
HCT: 39.6 % (ref 39.0–52.0)
Hemoglobin: 13.3 g/dL (ref 13.0–17.0)
Lymphocytes Relative: 16.7 % (ref 12.0–46.0)
Lymphs Abs: 0.9 10*3/uL (ref 0.7–4.0)
MCHC: 33.5 g/dL (ref 30.0–36.0)
MCV: 97.8 fl (ref 78.0–100.0)
Monocytes Absolute: 0.5 10*3/uL (ref 0.1–1.0)
Monocytes Relative: 9.7 % (ref 3.0–12.0)
Neutro Abs: 3.7 10*3/uL (ref 1.4–7.7)
Neutrophils Relative %: 70.1 % (ref 43.0–77.0)
Platelets: 162 10*3/uL (ref 150.0–400.0)
RBC: 4.05 Mil/uL — ABNORMAL LOW (ref 4.22–5.81)
RDW: 13.3 % (ref 11.5–15.5)
WBC: 5.2 10*3/uL (ref 4.0–10.5)

## 2017-11-03 LAB — PSA: PSA: 0.9 ng/mL (ref 0.10–4.00)

## 2017-11-03 LAB — LIPID PANEL
Cholesterol: 146 mg/dL (ref 0–200)
HDL: 38.4 mg/dL — ABNORMAL LOW (ref >39.00)
LDL Cholesterol: 90 mg/dL (ref 0–99)
NonHDL: 107.56
Total CHOL/HDL Ratio: 4
Triglycerides: 87 mg/dL (ref 0.0–149.0)
VLDL: 17.4 mg/dL (ref 0.0–40.0)

## 2017-11-03 MED ORDER — ZOSTER VAC RECOMB ADJUVANTED 50 MCG/0.5ML IM SUSR: 0.5000 mL | Freq: Once | INTRAMUSCULAR | 1 refills | Status: AC

## 2017-11-03 NOTE — Assessment & Plan Note (Signed)
For f/u lab today, consider renal referral

## 2017-11-03 NOTE — Assessment & Plan Note (Signed)
stable overall by history and exam, recent data reviewed with pt, and pt to continue medical treatment as before,  to f/u any worsening symptoms or concerns BP Readings from Last 3 Encounters:  11/03/17 128/80  09/10/17 109/60  07/22/17 120/72

## 2017-11-03 NOTE — Assessment & Plan Note (Signed)

## 2017-11-03 NOTE — Assessment & Plan Note (Signed)
Mild, for a1c with labs today

## 2017-11-03 NOTE — Progress Notes (Signed)
Subjective:    Patient ID: Jeffrey Hardy, male    DOB: 1936/01/25, 82 y.o.   MRN: [id number]  HPI  Here for wellness and f/u;  Overall doing ok;  Pt denies Chest pain, worsening SOB, DOE, wheezing, orthopnea, PND, worsening LE edema, palpitations, dizziness or syncope.  Pt denies neurological change such as new headache, facial or extremity weakness.  Pt denies polydipsia, polyuria, or low sugar symptoms. Pt states overall good compliance with treatment and medications, good tolerability, and has been trying to follow appropriate diet.  Pt denies worsening depressive symptoms, suicidal ideation or panic. No fever, night sweats, wt loss, loss of appetite, or other constitutional symptoms.  Pt states good ability with ADL's, has low fall risk, home safety reviewed and adequate, no other significant changes in hearing or vision, and not active with exercise. S/p left hip THR, now with PT and walking with cane inside level.  Wife notes Cr 2.3 at [facility]rology last wk, slightly worse than baseline.  No other prior bone fractures Past Medical History:  Diagnosis Date  . Anxiety   . Bilateral renal cysts   . Cancer ([place])    renal mass  . Depression   . Diverticulosis of colon   . Erectile dysfunction   . Hematuria   . History of cerebral embolic infarction x2 CVA's ---  neurologist-  [name]--- per pt residual abnormal gait , does need cane   12-16-2011  right frontal embolic infarct and [id number] left frontal embolic infarct (per first MRI previous hemorrgaic cerebral ischemia)  . History of duodenal ulcer    1997  . History of Helicobacter pylori infection    1997  . History of hiatal hernia   . History of loop recorder    Current  . History of syncope    1997  and 2014  s/p  loop recorder  . Hyperlipidemia   . Hypertension   . Nocturia   . PAF (paroxysmal atrial fibrillation) [facility])    cardiologist-  dr [name]  . Renal cell cancer ([place]) 09/06/2016  . Right renal mass   . Status post  placement of implantable loop recorder    07-06-2012  . Stroke [facility])    2013 2 mild strokes mild weakness bil sides  . Urgency of urination   . Wears hearing aid    right ear only   Past Surgical History:  Procedure Laterality Date  . APPENDECTOMY  age 16  . COLONOSCOPY  last one 07-17-2015  . CYSTOSCOPY/RETROGRADE/URETEROSCOPY Right 04/22/2016   Procedure: CYSTOSCOPY/RETROGRADE/URETEROSCOPY/ BIOPSY/ STENT PLACEMENT;  Surgeon: [name], MD;  Location: [facility];  Service: Urology;  Laterality: Right;  . ESOPHAGOGASTRODUODENOSCOPY  last one 09-16-2014  . LOOP RECORDER IMPLANT N/A 07/06/2012   Procedure: LOOP RECORDER IMPLANT;  Surgeon: [name], MD;  Location: [facility] CATH LAB;  Service: Cardiovascular;  Laterality: N/A;  . ROBOT ASSITED LAPAROSCOPIC NEPHROURETERECTOMY Right 05/13/2016   Procedure: XI ROBOT ASSITED LAPAROSCOPIC NEPHROURETERECTOMY;  Surgeon: [name], MD;  Location: [facility];  Service: Urology;  Laterality: Right;  . TEE WITHOUT CARDIOVERSION  12/19/2011   Procedure: TRANSESOPHAGEAL ECHOCARDIOGRAM (TEE);  Surgeon: [name], MD;  Location: [facility] ENDOSCOPY;  Service: Cardiovascular;  Laterality: N/A;  no evidence thrombus in the atrial cavity or appendage;  mild AV thickened w/ mild AI;  minimal fixed plaque in thoracic aorta;  mild MV thickened w/ mild MR;  NO pfo by color doppler or with injection of agitated saline  .  TOTAL HIP ARTHROPLASTY Left 09/07/2017   Procedure: TOTAL HIP ARTHROPLASTY ANTERIOR APPROACH;  Surgeon: Rod Can, MD;  Location: [facility];  Service: Orthopedics;  Laterality: Left;  . TRANSTHORACIC ECHOCARDIOGRAM  09-10-20103   grade 1 diastolic dysfunction,  ef 55-60%/  AV sclerosis without stenosis/  trivial TR  . WRIST FRACTURE SURGERY Right 2014  approx.   retained hardware    reports that he quit smoking about 51 years ago. His smoking use included cigarettes and pipe. He quit after 5.00 years of use. He has never used  smokeless tobacco. He reports that he does not drink alcohol or use drugs. family history includes Brain cancer in his father; Dementia in his brother; Heart Problems in his mother; Parkinsonism in his brother. Allergies  Allergen Reactions  . Acyclovir And Related    Current Outpatient Medications on File Prior to Visit  Medication Sig Dispense Refill  . cholecalciferol (VITAMIN D) 1000 units tablet Take 1,000 Units by mouth at bedtime.    [name] ELIQUIS 5 MG TABS tablet TAKE ONE (1) TABLET BY MOUTH TWO (2) TIMES DAILY 180 tablet 0  . HYDROcodone-acetaminophen (NORCO/VICODIN) 5-325 MG tablet Take 1-2 tablets by mouth every 6 (six) hours as needed for moderate pain. 30 tablet 0  . tamsulosin (FLOMAX) 0.4 MG CAPS capsule Take 1 capsule (0.4 mg total) by mouth daily after breakfast. 90 capsule 3   No current facility-administered medications on file prior to visit.    Review of Systems Constitutional: Negative for other unusual diaphoresis, sweats, appetite or weight changes HENT: Negative for other worsening hearing loss, ear pain, facial swelling, mouth sores or neck stiffness.   Eyes: Negative for other worsening pain, redness or other visual disturbance.  Respiratory: Negative for other stridor or swelling Cardiovascular: Negative for other palpitations or other chest pain  Gastrointestinal: Negative for worsening diarrhea or loose stools, blood in stool, distention or other pain Genitourinary: Negative for hematuria, flank pain or other change in urine volume.  Musculoskeletal: Negative for myalgias or other joint swelling.  Skin: Negative for other color change, or other wound or worsening drainage.  Neurological: Negative for other syncope or numbness. Hematological: Negative for other adenopathy or swelling Psychiatric/Behavioral: Negative for hallucinations, other worsening agitation, SI, self-injury, or new decreased concentration All other system neg per pt, limited by memory dysfxn     Objective:   Physical Exam BP 128/80   Temp 97.6 F (36.4 C) (Oral)   Ht 6\' 1"  (1.854 m)   Wt 199 lb (90.3 kg)   BMI 26.25 kg/m  VS noted,  Constitutional: Pt is oriented to person, place, and time. Appears well-developed and well-nourished, in no significant distress and comfortable Head: Normocephalic and atraumatic  Eyes: Conjunctivae and EOM are normal. Pupils are equal, round, and reactive to light Right Ear: External ear normal without discharge Left Ear: External ear normal without discharge Nose: Nose without discharge or deformity Mouth/Throat: Oropharynx is without other ulcerations and moist  Neck: Normal range of motion. Neck supple. No JVD present. No tracheal deviation present or significant neck LA or mass Cardiovascular: Normal rate, regular rhythm, normal heart sounds and intact distal pulses.   Pulmonary/Chest: WOB normal and breath sounds without rales or wheezing  Abdominal: Soft. Bowel sounds are normal. NT. No HSM  Musculoskeletal: Normal range of motion. Exhibits no edema Lymphadenopathy: Has no other cervical adenopathy.  Neurological: Pt is alert and oriented to person . Pt has normal reflexes. No cranial nerve deficit. Motor grossly intact, Gait intact  Skin: Skin is warm and dry. No rash noted or new ulcerations Psychiatric:  Has normal mood and affect. Behavior is normal without agitation No other exam findings Lab Results  Component Value Date   WBC 5.2 11/03/2017   HGB 13.3 11/03/2017   HCT 39.6 11/03/2017   PLT 162.0 11/03/2017   GLUCOSE 97 11/03/2017   CHOL 146 11/03/2017   TRIG 87.0 11/03/2017   HDL 38.40 (L) 11/03/2017   LDLCALC 90 11/03/2017   ALT 11 11/03/2017   AST 11 11/03/2017   NA 141 11/03/2017   K 4.3 11/03/2017   CL 102 11/03/2017   CREATININE 2.12 (H) 11/03/2017   BUN 28 (H) 11/03/2017   CO2 29 11/03/2017   TSH 1.94 11/03/2017   PSA 0.90 11/03/2017   INR 1.12 09/04/2017   HGBA1C 5.1 11/03/2017       Assessment & Plan:

## 2017-11-03 NOTE — Assessment & Plan Note (Signed)
S/p replacement, for dxa r/o osteoporosis

## 2017-11-03 NOTE — Patient Instructions (Addendum)
Your shingles shot was sent to the pharmacy  Please schedule the bone density test before leaving today at the scheduling desk (where you check out)  Please continue all other medications as before, and refills have been done if requested.  Please have the pharmacy call with any other refills you may need.  Please continue your efforts at being more active, low cholesterol diet, and weight control.  You are otherwise up to date with prevention measures today.  Please keep your appointments with your specialists as you may have planned  Please return in 6 months, or sooner if needed, with Lab testing done 3-5 days before

## 2017-11-04 ENCOUNTER — Ambulatory Visit (INDEPENDENT_AMBULATORY_CARE_PROVIDER_SITE_OTHER)
Admission: RE | Admit: 2017-11-04 | Discharge: 2017-11-04 | Disposition: A | Discharge status: Home / Self Care | Payer: PPO | Source: Ambulatory Visit | Attending: Internal Medicine | Admitting: Internal Medicine

## 2017-11-04 DIAGNOSIS — Z8781 Personal history of (healed) traumatic fracture: Secondary | ICD-10-CM | POA: Diagnosis not present

## 2017-11-05 ENCOUNTER — Other Ambulatory Visit: Payer: Self-pay | Admitting: Internal Medicine

## 2017-11-05 ENCOUNTER — Telehealth: Payer: Self-pay

## 2017-11-05 DIAGNOSIS — N183 Chronic kidney disease, stage 3 unspecified: Secondary | ICD-10-CM

## 2017-11-05 NOTE — Telephone Encounter (Signed)
Called pt, LVM.   

## 2017-11-05 NOTE — Telephone Encounter (Signed)
Pts wife calling back to get husbands results. Please advise.

## 2017-11-05 NOTE — Telephone Encounter (Signed)
-----   Message from Biagio Borg, MD sent at 11/05/2017 12:54 PM EDT ----- Left message on MyChart, pt to cont same tx except  The test results show that your current treatment is OK, except though the kidney function is not worse, it is still quite low.  We should refer to Nephrology for further consideration (kidney doctor).    Jeffrey Hardy to please inform pt, I will do referral

## 2017-11-06 NOTE — Telephone Encounter (Signed)
Called pt's wife, LVM with detailed msg of lab results.

## 2017-11-10 ENCOUNTER — Other Ambulatory Visit: Payer: Self-pay | Admitting: Internal Medicine

## 2017-11-10 MED ORDER — ALENDRONATE SODIUM 70 MG PO TABS: 70.0000 mg | ORAL_TABLET | ORAL | 3 refills | Status: DC

## 2017-12-01 DIAGNOSIS — D631 Anemia in chronic kidney disease: Secondary | ICD-10-CM | POA: Diagnosis not present

## 2017-12-01 DIAGNOSIS — I639 Cerebral infarction, unspecified: Secondary | ICD-10-CM | POA: Diagnosis not present

## 2017-12-01 DIAGNOSIS — N183 Chronic kidney disease, stage 3 (moderate): Secondary | ICD-10-CM | POA: Diagnosis not present

## 2017-12-01 DIAGNOSIS — N390 Urinary tract infection, site not specified: Secondary | ICD-10-CM | POA: Diagnosis not present

## 2017-12-01 DIAGNOSIS — Z0000 Encounter for general adult medical examination without abnormal findings: Secondary | ICD-10-CM | POA: Diagnosis not present

## 2017-12-01 DIAGNOSIS — C649 Malignant neoplasm of unspecified kidney, except renal pelvis: Secondary | ICD-10-CM | POA: Diagnosis not present

## 2017-12-01 DIAGNOSIS — Q600 Renal agenesis, unilateral: Secondary | ICD-10-CM | POA: Diagnosis not present

## 2017-12-01 DIAGNOSIS — D696 Thrombocytopenia, unspecified: Secondary | ICD-10-CM | POA: Diagnosis not present

## 2017-12-01 DIAGNOSIS — I129 Hypertensive chronic kidney disease with stage 1 through stage 4 chronic kidney disease, or unspecified chronic kidney disease: Secondary | ICD-10-CM | POA: Diagnosis not present

## 2017-12-01 DIAGNOSIS — E785 Hyperlipidemia, unspecified: Secondary | ICD-10-CM | POA: Diagnosis not present

## 2017-12-01 DIAGNOSIS — I480 Paroxysmal atrial fibrillation: Secondary | ICD-10-CM | POA: Diagnosis not present

## 2017-12-01 DIAGNOSIS — N2581 Secondary hyperparathyroidism of renal origin: Secondary | ICD-10-CM | POA: Diagnosis not present

## 2017-12-03 DIAGNOSIS — N183 Chronic kidney disease, stage 3 (moderate): Secondary | ICD-10-CM | POA: Diagnosis not present

## 2017-12-04 ENCOUNTER — Other Ambulatory Visit (HOSPITAL_BASED_OUTPATIENT_CLINIC_OR_DEPARTMENT_OTHER): Payer: Self-pay | Admitting: Nephrology

## 2017-12-04 DIAGNOSIS — N183 Chronic kidney disease, stage 3 unspecified: Secondary | ICD-10-CM

## 2017-12-04 DIAGNOSIS — C649 Malignant neoplasm of unspecified kidney, except renal pelvis: Secondary | ICD-10-CM

## 2017-12-04 DIAGNOSIS — IMO0002 Reserved for concepts with insufficient information to code with codable children: Secondary | ICD-10-CM

## 2017-12-04 DIAGNOSIS — Q600 Renal agenesis, unilateral: Secondary | ICD-10-CM

## 2017-12-06 ENCOUNTER — Ambulatory Visit (HOSPITAL_BASED_OUTPATIENT_CLINIC_OR_DEPARTMENT_OTHER)
Admission: RE | Admit: 2017-12-06 | Discharge: 2017-12-06 | Disposition: A | Discharge status: Home / Self Care | Payer: PPO | Source: Ambulatory Visit | Attending: Nephrology | Admitting: Nephrology

## 2017-12-06 DIAGNOSIS — N183 Chronic kidney disease, stage 3 unspecified: Secondary | ICD-10-CM

## 2017-12-06 DIAGNOSIS — C649 Malignant neoplasm of unspecified kidney, except renal pelvis: Secondary | ICD-10-CM

## 2017-12-06 DIAGNOSIS — Q600 Renal agenesis, unilateral: Secondary | ICD-10-CM | POA: Diagnosis not present

## 2017-12-06 DIAGNOSIS — N189 Chronic kidney disease, unspecified: Secondary | ICD-10-CM | POA: Diagnosis not present

## 2017-12-06 DIAGNOSIS — IMO0002 Reserved for concepts with insufficient information to code with codable children: Secondary | ICD-10-CM

## 2017-12-09 DIAGNOSIS — N401 Enlarged prostate with lower urinary tract symptoms: Secondary | ICD-10-CM | POA: Diagnosis not present

## 2017-12-09 DIAGNOSIS — N3941 Urge incontinence: Secondary | ICD-10-CM | POA: Diagnosis not present

## 2017-12-16 DIAGNOSIS — D631 Anemia in chronic kidney disease: Secondary | ICD-10-CM | POA: Diagnosis not present

## 2017-12-30 DIAGNOSIS — I480 Paroxysmal atrial fibrillation: Secondary | ICD-10-CM | POA: Diagnosis not present

## 2017-12-30 DIAGNOSIS — D631 Anemia in chronic kidney disease: Secondary | ICD-10-CM | POA: Diagnosis not present

## 2017-12-30 DIAGNOSIS — D696 Thrombocytopenia, unspecified: Secondary | ICD-10-CM | POA: Diagnosis not present

## 2017-12-30 DIAGNOSIS — I129 Hypertensive chronic kidney disease with stage 1 through stage 4 chronic kidney disease, or unspecified chronic kidney disease: Secondary | ICD-10-CM | POA: Diagnosis not present

## 2017-12-30 DIAGNOSIS — N390 Urinary tract infection, site not specified: Secondary | ICD-10-CM | POA: Diagnosis not present

## 2017-12-30 DIAGNOSIS — C649 Malignant neoplasm of unspecified kidney, except renal pelvis: Secondary | ICD-10-CM | POA: Diagnosis not present

## 2017-12-30 DIAGNOSIS — I639 Cerebral infarction, unspecified: Secondary | ICD-10-CM | POA: Diagnosis not present

## 2017-12-30 DIAGNOSIS — Q600 Renal agenesis, unilateral: Secondary | ICD-10-CM | POA: Diagnosis not present

## 2017-12-30 DIAGNOSIS — N2581 Secondary hyperparathyroidism of renal origin: Secondary | ICD-10-CM | POA: Diagnosis not present

## 2017-12-30 DIAGNOSIS — N2889 Other specified disorders of kidney and ureter: Secondary | ICD-10-CM | POA: Diagnosis not present

## 2017-12-30 DIAGNOSIS — N183 Chronic kidney disease, stage 3 (moderate): Secondary | ICD-10-CM | POA: Diagnosis not present

## 2017-12-30 DIAGNOSIS — E785 Hyperlipidemia, unspecified: Secondary | ICD-10-CM | POA: Diagnosis not present

## 2018-01-07 ENCOUNTER — Other Ambulatory Visit (HOSPITAL_BASED_OUTPATIENT_CLINIC_OR_DEPARTMENT_OTHER): Payer: Self-pay | Admitting: Nephrology

## 2018-01-07 DIAGNOSIS — N2889 Other specified disorders of kidney and ureter: Secondary | ICD-10-CM

## 2018-01-09 ENCOUNTER — Other Ambulatory Visit: Payer: Self-pay | Admitting: Internal Medicine

## 2018-01-10 ENCOUNTER — Ambulatory Visit (HOSPITAL_BASED_OUTPATIENT_CLINIC_OR_DEPARTMENT_OTHER)
Admission: RE | Admit: 2018-01-10 | Discharge: 2018-01-10 | Disposition: A | Discharge status: Home / Self Care | Payer: PPO | Source: Ambulatory Visit | Attending: Nephrology | Admitting: Nephrology

## 2018-01-10 DIAGNOSIS — Z905 Acquired absence of kidney: Secondary | ICD-10-CM | POA: Diagnosis not present

## 2018-01-10 DIAGNOSIS — K7689 Other specified diseases of liver: Secondary | ICD-10-CM | POA: Diagnosis not present

## 2018-01-10 DIAGNOSIS — N289 Disorder of kidney and ureter, unspecified: Secondary | ICD-10-CM | POA: Diagnosis not present

## 2018-01-10 DIAGNOSIS — N2889 Other specified disorders of kidney and ureter: Secondary | ICD-10-CM

## 2018-01-12 DIAGNOSIS — L814 Other melanin hyperpigmentation: Secondary | ICD-10-CM | POA: Diagnosis not present

## 2018-01-12 DIAGNOSIS — L570 Actinic keratosis: Secondary | ICD-10-CM | POA: Diagnosis not present

## 2018-01-12 DIAGNOSIS — D485 Neoplasm of uncertain behavior of skin: Secondary | ICD-10-CM | POA: Diagnosis not present

## 2018-01-13 DIAGNOSIS — S72032D Displaced midcervical fracture of left femur, subsequent encounter for closed fracture with routine healing: Secondary | ICD-10-CM | POA: Diagnosis not present

## 2018-01-13 DIAGNOSIS — M545 Low back pain: Secondary | ICD-10-CM | POA: Diagnosis not present

## 2018-01-13 DIAGNOSIS — M21371 Foot drop, right foot: Secondary | ICD-10-CM | POA: Diagnosis not present

## 2018-01-16 DIAGNOSIS — M21371 Foot drop, right foot: Secondary | ICD-10-CM | POA: Diagnosis not present

## 2018-01-21 ENCOUNTER — Ambulatory Visit (HOSPITAL_COMMUNITY)
Admission: RE | Admit: 2018-01-21 | Discharge: 2018-01-21 | Disposition: A | Discharge status: Home / Self Care | Payer: PPO | Source: Ambulatory Visit | Attending: Nurse Practitioner | Admitting: Nurse Practitioner

## 2018-01-21 ENCOUNTER — Encounter (HOSPITAL_COMMUNITY): Payer: Self-pay | Admitting: Nurse Practitioner

## 2018-01-21 VITALS — BP 120/74 | HR 70 | Ht 73.0 in | Wt 200.8 lb

## 2018-01-21 DIAGNOSIS — Z85528 Personal history of other malignant neoplasm of kidney: Secondary | ICD-10-CM | POA: Diagnosis not present

## 2018-01-21 DIAGNOSIS — I10 Essential (primary) hypertension: Secondary | ICD-10-CM | POA: Insufficient documentation

## 2018-01-21 DIAGNOSIS — Z96642 Presence of left artificial hip joint: Secondary | ICD-10-CM | POA: Diagnosis not present

## 2018-01-21 DIAGNOSIS — Z87891 Personal history of nicotine dependence: Secondary | ICD-10-CM | POA: Insufficient documentation

## 2018-01-21 DIAGNOSIS — I480 Paroxysmal atrial fibrillation: Secondary | ICD-10-CM | POA: Diagnosis not present

## 2018-01-21 DIAGNOSIS — Z9889 Other specified postprocedural states: Secondary | ICD-10-CM | POA: Diagnosis not present

## 2018-01-21 DIAGNOSIS — Z7901 Long term (current) use of anticoagulants: Secondary | ICD-10-CM | POA: Diagnosis not present

## 2018-01-21 DIAGNOSIS — Z79899 Other long term (current) drug therapy: Secondary | ICD-10-CM | POA: Diagnosis not present

## 2018-01-21 DIAGNOSIS — Z8673 Personal history of transient ischemic attack (TIA), and cerebral infarction without residual deficits: Secondary | ICD-10-CM | POA: Insufficient documentation

## 2018-01-21 DIAGNOSIS — E785 Hyperlipidemia, unspecified: Secondary | ICD-10-CM | POA: Insufficient documentation

## 2018-01-21 MED ORDER — APIXABAN 2.5 MG PO TABS: 2.5000 mg | ORAL_TABLET | Freq: Two times a day (BID) | ORAL | 2 refills | Status: DC

## 2018-01-21 NOTE — Patient Instructions (Signed)
On birthday decrease eliquis to 2.5mg  twice a day

## 2018-01-21 NOTE — Progress Notes (Signed)
Patient ID: Jeffrey Hardy, male   DOB: [birth year], 82 y.o.   MRN: [id number]     Primary Care Physician: Biagio Borg, MD Referring Physician: Dr. Jolyn Nap   Jeffrey Hardy is a 82 y.o. male with a h/o cryptogenic stroke  12/20/11. He had a Linq monitor implanted in 2014. He had been on aggrenox since his stroke in 2013. When his linq showed afib 07/2015,  he was sent  to afib clinic by Dr. [name] to discuss anticoagulation. He was not aware of irregular heart beat. Chadsvasc score is 5.He was started on eliquis and aggrenox was stopped.  He returned to the afib clinic 08/29/15. No issues with eliquis, no bleeding issues. Not aware of any irregular heart beat.   He was seen by Dr. [name] 11/09/15 and Linq was ERI and he elected to leave in place. No c/o of irregular heart beat.  F/U afib clinic 06/10/16. He continues on apixaban. No bleeding issues. Last Creatinine  was 1.90 05/16/16 , but is still on appropriate dose with age being less than 48 and weight at 187. But at age 49, he will have to reduce dose to 2.5 mg bid if renal function stays over 1.5. He had nephrectomy 2 weeks ago for renal mass which was cancerous but will not have to have any chem or radiation. He reports no afib as far as he know since that time. His nephrologist will be checking labs every 3 months for the next year. No further hematuria since surgery.  F/u 01/20/17, he felt funny a few weeks ago and his wife took him to fire station thinking it might be afib, but he was in [place]. The symptoms were short lived. Recent Creatinine is around 2.0. Still appropriately dosed at eliquis 5 mg bid. He has close f/u with his nephrologist with h/o renal ca and lone kidney. SR on EKG today.  F/u 10/15. Since last seeing pt, he fell and broke his left hip. He had afib the day after surgery but since then has been in SR. He will be turning 82 in about 5 weeks and will plan to reduce eliquis to 2.5 mg  bid with renal function over 1.5.  Today, he  denies symptoms of palpitations, chest pain, shortness of breath, orthopnea, PND, lower extremity edema, dizziness, presyncope, syncope, or neurologic sequela. The patient is tolerating medications without difficulties and is otherwise without complaint today.   Past Medical History:  Diagnosis Date  . Anxiety   . Bilateral renal cysts   . Cancer ([place])    renal mass  . Depression   . Diverticulosis of colon   . Erectile dysfunction   . Hematuria   . History of cerebral embolic infarction x2 CVA's ---  neurologist-  [name]--- per pt residual abnormal gait , does need cane   12-16-2011  right frontal embolic infarct and [id number] left frontal embolic infarct (per first MRI previous hemorrgaic cerebral ischemia)  . History of duodenal ulcer    1997  . History of Helicobacter pylori infection    1997  . History of hiatal hernia   . History of loop recorder    Current  . History of syncope    1997  and 2014  s/p  loop recorder  . Hyperlipidemia   . Hypertension   . Nocturia   . PAF (paroxysmal atrial fibrillation) [facility])    cardiologist-  dr [name]  . Renal cell cancer ([place]) 09/06/2016  . Right renal  mass   . Status post placement of implantable loop recorder    07-06-2012  . Stroke [facility])    2013 2 mild strokes mild weakness bil sides  . Urgency of urination   . Wears hearing aid    right ear only   Past Surgical History:  Procedure Laterality Date  . APPENDECTOMY  age 82  . COLONOSCOPY  last one 07-17-2015  . CYSTOSCOPY/RETROGRADE/URETEROSCOPY Right 04/22/2016   Procedure: CYSTOSCOPY/RETROGRADE/URETEROSCOPY/ BIOPSY/ STENT PLACEMENT;  Surgeon: [name], MD;  Location: [facility];  Service: Urology;  Laterality: Right;  . ESOPHAGOGASTRODUODENOSCOPY  last one 09-16-2014  . LOOP RECORDER IMPLANT N/A 07/06/2012   Procedure: LOOP RECORDER IMPLANT;  Surgeon: [name], MD;  Location: [facility] CATH LAB;  Service: Cardiovascular;  Laterality: N/A;  . ROBOT  ASSITED LAPAROSCOPIC NEPHROURETERECTOMY Right 05/13/2016   Procedure: XI ROBOT ASSITED LAPAROSCOPIC NEPHROURETERECTOMY;  Surgeon: [name], MD;  Location[facility];  Service: Urology;  Laterality: Right;  . TEE WITHOUT CARDIOVERSION  12/19/2011   Procedure: TRANSESOPHAGEAL ECHOCARDIOGRAM (TEE);  Surgeon: [name], MD;  Location: [facility] ENDOSCOPY;  Service: Cardiovascular;  Laterality: N/A;  no evidence thrombus in the atrial cavity or appendage;  mild AV thickened w/ mild AI;  minimal fixed plaque in thoracic aorta;  mild MV thickened w/ mild MR;  NO pfo by color doppler or with injection of agitated saline  . TOTAL HIP ARTHROPLASTY Left 09/07/2017   Procedure: TOTAL HIP ARTHROPLASTY ANTERIOR APPROACH;  Surgeon: Rod Can, MD;  Location: [facility];  Service: Orthopedics;  Laterality: Left;  . TRANSTHORACIC ECHOCARDIOGRAM  09-10-20103   grade 1 diastolic dysfunction,  ef 55-60%/  AV sclerosis without stenosis/  trivial TR  . WRIST FRACTURE SURGERY Right 2014  approx.   retained hardware    Current Outpatient Medications  Medication Sig Dispense Refill  . ferrous sulfate 325 (65 FE) MG tablet Take 325 mg by mouth daily with breakfast.    . tamsulosin (FLOMAX) 0.4 MG CAPS capsule Take 1 capsule (0.4 mg total) by mouth daily after breakfast. 90 capsule 3  . Trospium Chloride 60 MG CP24 Take 1 capsule by mouth daily.    [name] apixaban (ELIQUIS) 2.5 MG TABS tablet Take 1 tablet (2.5 mg total) by mouth 2 (two) times daily. 180 tablet 2  . cholecalciferol (VITAMIN D) 1000 units tablet Take 5,000 Units by mouth at bedtime.      No current facility-administered medications for this encounter.     Allergies  Allergen Reactions  . Acyclovir And Related     Social History   Socioeconomic History  . Marital status: Married    Spouse name: Not on file  . Number of children: 2  . Years of education: Not on file  . Highest education level: Not on file  Occupational History  . Occupation:  retired  Scientific laboratory technician  . Financial resource strain: Not on file  . Food insecurity:    Worry: Not on file    Inability: Not on file  . Transportation needs:    Medical: Not on file    Non-medical: Not on file  Tobacco Use  . Smoking status: Former Smoker    Years: 5.00    Types: Cigarettes, Pipe    Last attempt to quit: 09/07/1966    Years since quitting: 51.4  . Smokeless tobacco: Never Used  Substance and Sexual Activity  . Alcohol use: No    Alcohol/week: 0.0 standard drinks  . Drug use: No  .  Sexual activity: Yes  Lifestyle  . Physical activity:    Days per week: Not on file    Minutes per session: Not on file  . Stress: Not on file  Relationships  . Social connections:    Talks on phone: Not on file    Gets together: Not on file    Attends religious service: Not on file    Active member of club or organization: Not on file    Attends meetings of clubs or organizations: Not on file    Relationship status: Not on file  . Intimate partner violence:    Fear of current or ex partner: Not on file    Emotionally abused: Not on file    Physically abused: Not on file    Forced sexual activity: Not on file  Other Topics Concern  . 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 Patient is retired from [employer]    Family History  Problem Relation Age of Onset  . Heart Problems Mother   . Brain cancer Father   . Parkinsonism Brother   . Dementia Brother   . Colon cancer Neg Hx   . Esophageal cancer Neg Hx   . Stomach cancer Neg Hx   . Gastric cancer Neg Hx   . Liver disease Neg Hx   . Kidney disease Neg Hx   . Diabetes Neg Hx     ROS- All systems are reviewed and negative except as per the HPI above  Physical Exam: Vitals:   01/21/18 1116  BP: 120/74  Pulse: 70  Weight: 91.1 kg  Height: 6\' 1"  (1.854 m)    GEN- The patient is well appearing, alert and oriented x 3 today.   Head- normocephalic, atraumatic Eyes-  Sclera clear,  conjunctiva pink Ears- hearing intact Oropharynx- clear Neck- supple, no JVP Lymph- no cervical lymphadenopathy Lungs- Clear to ausculation bilaterally, normal work of breathing Heart- Regular rate and rhythm, no murmurs, rubs or gallops, PMI not laterally displaced GI- soft, NT, ND, + BS Extremities- no clubbing, cyanosis, or edema MS- no significant deformity or atrophy Skin- no rash or lesion Psych- euthymic mood, full affect Neuro- strength and sensation are intact  EKG- NSR, rate 70 bpm pr int 148 ms, qrs int 90 ms, Qtc 427 ms Epic records reviewed Linq is ERI  Assessment and Plan: 1. Paroxsymal afib No afib noted With h/o cyrptogenic stroke, chadsvasc score of 5, continue eliquis but reduce to 2.5 mg bid at his birthday , he will be turning 36 in November with also creatinine at 2.12  2. Rt renal carcinoma with radical nephrouretectomy Hematuria prior to surgery but non since surgery Continue eliquis  3. Hip fx with surgery in May Afib at time after surgery but none seen since.  F/u in afib clinic as needed   Butch Penny C. Lamichael Youkhana, [facility] [address] [place], [place] [address] [phone]

## 2018-01-23 DIAGNOSIS — M21371 Foot drop, right foot: Secondary | ICD-10-CM | POA: Diagnosis not present

## 2018-01-29 ENCOUNTER — Emergency Department (HOSPITAL_BASED_OUTPATIENT_CLINIC_OR_DEPARTMENT_OTHER): Payer: PPO

## 2018-01-29 ENCOUNTER — Other Ambulatory Visit: Payer: Self-pay

## 2018-01-29 ENCOUNTER — Inpatient Hospital Stay (HOSPITAL_BASED_OUTPATIENT_CLINIC_OR_DEPARTMENT_OTHER)
Admission: EM | Admit: 2018-01-29 | Discharge: 2018-02-04 | DRG: 470 | Disposition: A | Discharge status: Home Health | Payer: PPO | Attending: Internal Medicine | Admitting: Internal Medicine

## 2018-01-29 ENCOUNTER — Encounter (HOSPITAL_BASED_OUTPATIENT_CLINIC_OR_DEPARTMENT_OTHER): Payer: Self-pay | Admitting: *Deleted

## 2018-01-29 DIAGNOSIS — N183 Chronic kidney disease, stage 3 unspecified: Secondary | ICD-10-CM | POA: Diagnosis present

## 2018-01-29 DIAGNOSIS — E785 Hyperlipidemia, unspecified: Secondary | ICD-10-CM | POA: Diagnosis present

## 2018-01-29 DIAGNOSIS — W19XXXA Unspecified fall, initial encounter: Secondary | ICD-10-CM

## 2018-01-29 DIAGNOSIS — I129 Hypertensive chronic kidney disease with stage 1 through stage 4 chronic kidney disease, or unspecified chronic kidney disease: Secondary | ICD-10-CM | POA: Diagnosis not present

## 2018-01-29 DIAGNOSIS — Z8673 Personal history of transient ischemic attack (TIA), and cerebral infarction without residual deficits: Secondary | ICD-10-CM

## 2018-01-29 DIAGNOSIS — Z87891 Personal history of nicotine dependence: Secondary | ICD-10-CM

## 2018-01-29 DIAGNOSIS — D62 Acute posthemorrhagic anemia: Secondary | ICD-10-CM | POA: Diagnosis not present

## 2018-01-29 DIAGNOSIS — I739 Peripheral vascular disease, unspecified: Secondary | ICD-10-CM | POA: Diagnosis not present

## 2018-01-29 DIAGNOSIS — Z96641 Presence of right artificial hip joint: Secondary | ICD-10-CM | POA: Diagnosis not present

## 2018-01-29 DIAGNOSIS — S72011A Unspecified intracapsular fracture of right femur, initial encounter for closed fracture: Secondary | ICD-10-CM | POA: Diagnosis not present

## 2018-01-29 DIAGNOSIS — D696 Thrombocytopenia, unspecified: Secondary | ICD-10-CM | POA: Diagnosis present

## 2018-01-29 DIAGNOSIS — Z7901 Long term (current) use of anticoagulants: Secondary | ICD-10-CM

## 2018-01-29 DIAGNOSIS — S72001A Fracture of unspecified part of neck of right femur, initial encounter for closed fracture: Secondary | ICD-10-CM | POA: Diagnosis not present

## 2018-01-29 DIAGNOSIS — Z419 Encounter for procedure for purposes other than remedying health state, unspecified: Secondary | ICD-10-CM

## 2018-01-29 DIAGNOSIS — K5900 Constipation, unspecified: Secondary | ICD-10-CM | POA: Diagnosis not present

## 2018-01-29 DIAGNOSIS — I480 Paroxysmal atrial fibrillation: Secondary | ICD-10-CM

## 2018-01-29 DIAGNOSIS — Z85528 Personal history of other malignant neoplasm of kidney: Secondary | ICD-10-CM

## 2018-01-29 DIAGNOSIS — R066 Hiccough: Secondary | ICD-10-CM | POA: Diagnosis not present

## 2018-01-29 DIAGNOSIS — R0789 Other chest pain: Secondary | ICD-10-CM | POA: Diagnosis not present

## 2018-01-29 DIAGNOSIS — Y92017 Garden or yard in single-family (private) house as the place of occurrence of the external cause: Secondary | ICD-10-CM

## 2018-01-29 DIAGNOSIS — C649 Malignant neoplasm of unspecified kidney, except renal pelvis: Secondary | ICD-10-CM | POA: Diagnosis present

## 2018-01-29 DIAGNOSIS — W010XXA Fall on same level from slipping, tripping and stumbling without subsequent striking against object, initial encounter: Secondary | ICD-10-CM | POA: Diagnosis present

## 2018-01-29 DIAGNOSIS — Z471 Aftercare following joint replacement surgery: Secondary | ICD-10-CM | POA: Diagnosis not present

## 2018-01-29 DIAGNOSIS — Y9301 Activity, walking, marching and hiking: Secondary | ICD-10-CM | POA: Diagnosis present

## 2018-01-29 DIAGNOSIS — S72041A Displaced fracture of base of neck of right femur, initial encounter for closed fracture: Secondary | ICD-10-CM | POA: Diagnosis not present

## 2018-01-29 DIAGNOSIS — M21379 Foot drop, unspecified foot: Secondary | ICD-10-CM | POA: Diagnosis not present

## 2018-01-29 DIAGNOSIS — R531 Weakness: Secondary | ICD-10-CM | POA: Diagnosis present

## 2018-01-29 LAB — BASIC METABOLIC PANEL
Anion gap: 11 (ref 5–15)
BUN: 33 mg/dL — ABNORMAL HIGH (ref 8–23)
CO2: 25 mmol/L (ref 22–32)
Calcium: 9.3 mg/dL (ref 8.9–10.3)
Chloride: 103 mmol/L (ref 98–111)
Creatinine, Ser: 2.02 mg/dL — ABNORMAL HIGH (ref 0.61–1.24)
GFR calc Af Amer: 34 mL/min — ABNORMAL LOW (ref >60)
GFR calc non Af Amer: 30 mL/min — ABNORMAL LOW (ref >60)
Glucose, Bld: 98 mg/dL (ref 70–99)
Potassium: 3.8 mmol/L (ref 3.5–5.1)
Sodium: 139 mmol/L (ref 135–145)

## 2018-01-29 LAB — CBC WITH DIFFERENTIAL/PLATELET
Abs Immature Granulocytes: 0.07 10*3/uL (ref 0.00–0.07)
Basophils Absolute: 0 10*3/uL (ref 0.0–0.1)
Basophils Relative: 0 %
Eosinophils Absolute: 0.1 10*3/uL (ref 0.0–0.5)
Eosinophils Relative: 1 %
HCT: 43.6 % (ref 39.0–52.0)
Hemoglobin: 14.4 g/dL (ref 13.0–17.0)
Immature Granulocytes: 1 %
Lymphocytes Relative: 7 %
Lymphs Abs: 0.6 10*3/uL — ABNORMAL LOW (ref 0.7–4.0)
MCH: 31.2 pg (ref 26.0–34.0)
MCHC: 33 g/dL (ref 30.0–36.0)
MCV: 94.6 fL (ref 80.0–100.0)
Monocytes Absolute: 0.5 10*3/uL (ref 0.1–1.0)
Monocytes Relative: 5 %
Neutro Abs: 7.3 10*3/uL (ref 1.7–7.7)
Neutrophils Relative %: 86 %
Platelets: 132 10*3/uL — ABNORMAL LOW (ref 150–400)
RBC: 4.61 MIL/uL (ref 4.22–5.81)
RDW: 13.7 % (ref 11.5–15.5)
WBC: 8.5 10*3/uL (ref 4.0–10.5)
nRBC: 0 % (ref 0.0–0.2)

## 2018-01-29 LAB — PROTIME-INR
INR: 1.19
Prothrombin Time: 15 seconds (ref 11.4–15.2)

## 2018-01-29 MED ORDER — MORPHINE SULFATE (PF) 4 MG/ML IV SOLN
4.0000 mg | Freq: Once | INTRAVENOUS | Status: AC
  Administered 2018-01-29: 4 mg via INTRAVENOUS
  Filled 2018-01-29: qty 1

## 2018-01-29 MED ORDER — MORPHINE SULFATE (PF) 2 MG/ML IV SOLN
1.0000 mg | INTRAVENOUS | Status: DC | PRN
  Administered 2018-01-30: 1 mg via INTRAVENOUS
  Filled 2018-01-29: qty 1

## 2018-01-29 MED ORDER — SODIUM CHLORIDE 0.9 % IV SOLN
INTRAVENOUS | Status: DC
  Administered 2018-01-29: 20:00:00 via INTRAVENOUS

## 2018-01-29 MED ORDER — ONDANSETRON HCL 4 MG/2ML IJ SOLN
4.0000 mg | Freq: Four times a day (QID) | INTRAMUSCULAR | Status: DC | PRN
  Administered 2018-02-02: 4 mg via INTRAVENOUS
  Filled 2018-01-29: qty 2

## 2018-01-29 NOTE — ED Provider Notes (Signed)
[facility] EMERGENCY DEPARTMENT Provider Note   CSN: [id number] Arrival date & time: 01/29/18  1819     History   Chief Complaint Chief Complaint  Patient presents with  . Hip Injury    HPI Jeffrey Hardy is a 82 y.o. male.  HPI Patient presents after a fall.  States he lost his balance and fell landing on his right hip.  States he did not hit his head.  States he is really not able to walk since it happened.  Has had previous surgery on his left hip.  He is on Eliquis.  No head or neck pain.  No elbow pain.  No numbness or weakness. Past Medical History:  Diagnosis Date  . Anxiety   . Bilateral renal cysts   . Cancer ([place])    renal mass  . Depression   . Diverticulosis of colon   . Erectile dysfunction   . Hematuria   . History of cerebral embolic infarction x2 CVA's ---  neurologist-  [name]--- per pt residual abnormal gait , does need cane   12-16-2011  right frontal embolic infarct and [id number] left frontal embolic infarct (per first MRI previous hemorrgaic cerebral ischemia)  . History of duodenal ulcer    1997  . History of Helicobacter pylori infection    1997  . History of hiatal hernia   . History of loop recorder    Current  . History of syncope    1997  and 2014  s/p  loop recorder  . Hyperlipidemia   . Hypertension   . Nocturia   . PAF (paroxysmal atrial fibrillation) [facility])    cardiologist-  dr [name]  . Renal cell cancer ([place]) 09/06/2016  . Right renal mass   . Status post placement of implantable loop recorder    07-06-2012  . Stroke [facility])    2013 2 mild strokes mild weakness bil sides  . Urgency of urination   . Wears hearing aid    right ear only    Patient Active Problem List   Diagnosis Date Noted  . Closed displaced fracture of left femoral neck ([place]) 09/07/2017  . CKD (chronic kidney disease) stage 3, GFR 30-59 ml/min (HCC) 09/04/2017  . Hip fracture ([place]) 09/04/2017  . Renal cell cancer ([place]) 09/06/2016  . Urinary  frequency 03/08/2016  . Memory dysfunction 03/08/2016  . History of colonic polyps 07/07/2015  . AKI (acute kidney injury) ([place]) 07/04/2015  . Hyperglycemia 07/04/2015  . Cerebrovascular disease, arteriosclerotic, post-stroke 12/28/2013  . Shingles 06/29/2013  . Incontinent of urine 12/29/2012  . Colles' fracture of right radius 12/14/2012  . Syncope 07/14/2012  . Thrombocytopenia ([place]) 01/13/2012  . 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 Preventative health care 07/27/2010  . Hyperlipidemia 05/12/2008  . Essential hypertension 05/12/2008  . GERD 05/12/2008  . DIVERTICULOSIS, COLON 05/12/2008    Past Surgical History:  Procedure Laterality Date  . APPENDECTOMY  age 31  . COLONOSCOPY  last one 07-17-2015  . CYSTOSCOPY/RETROGRADE/URETEROSCOPY Right 04/22/2016   Procedure: CYSTOSCOPY/RETROGRADE/URETEROSCOPY/ BIOPSY/ STENT PLACEMENT;  Surgeon: [name], MD;  Location[facility];  Service: Urology;  Laterality: Right;  . ESOPHAGOGASTRODUODENOSCOPY  last one 09-16-2014  . LOOP RECORDER IMPLANT N/A 07/06/2012   Procedure: LOOP RECORDER IMPLANT;  Surgeon: [name], MD;  Location: [facility] CATH LAB;  Service: Cardiovascular;  Laterality: N/A;  . ROBOT ASSITED LAPAROSCOPIC NEPHROURETERECTOMY Right 05/13/2016   Procedure: XI ROBOT ASSITED LAPAROSCOPIC NEPHROURETERECTOMY;  Surgeon: [name],  MD;  Location[facility];  Service: Urology;  Laterality: Right;  . TEE WITHOUT CARDIOVERSION  12/19/2011   Procedure: TRANSESOPHAGEAL ECHOCARDIOGRAM (TEE);  Surgeon: [name], MD;  Location: [facility] ENDOSCOPY;  Service: Cardiovascular;  Laterality: N/A;  no evidence thrombus in the atrial cavity or appendage;  mild AV thickened w/ mild AI;  minimal fixed plaque in thoracic aorta;  mild MV thickened w/ mild MR;  NO pfo by color doppler or with injection of agitated saline  . TOTAL HIP ARTHROPLASTY Left 09/07/2017   Procedure: TOTAL HIP ARTHROPLASTY ANTERIOR APPROACH;  Surgeon: Rod Can,  MD;  Location: [facility];  Service: Orthopedics;  Laterality: Left;  . TRANSTHORACIC ECHOCARDIOGRAM  09-10-20103   grade 1 diastolic dysfunction,  ef 55-60%/  AV sclerosis without stenosis/  trivial TR  . WRIST FRACTURE SURGERY Right 2014  approx.   retained hardware        Home Medications    Prior to Admission medications   Medication Sig Start Date End Date Taking? Authorizing Provider  apixaban (ELIQUIS) 2.5 MG TABS tablet Take 1 tablet (2.5 mg total) by mouth 2 (two) times daily. 01/21/18   Sherran Needs, NP  cholecalciferol (VITAMIN D) 1000 units tablet Take 5,000 Units by mouth at bedtime.     [provider]  ferrous sulfate 325 (65 FE) MG tablet Take 325 mg by mouth daily with breakfast.    [provider]  tamsulosin (FLOMAX) 0.4 MG CAPS capsule Take 1 capsule (0.4 mg total) by mouth daily after breakfast. 09/06/16   Biagio Borg, MD  Trospium Chloride 60 MG CP24 Take 1 capsule by mouth daily.    [provider]    Family History Family History  Problem Relation Age of Onset  . Heart Problems Mother   . Brain cancer Father   . Parkinsonism Brother   . Dementia Brother   . Colon cancer Neg Hx   . Esophageal cancer Neg Hx   . Stomach cancer Neg Hx   . Gastric cancer Neg Hx   . Liver disease Neg Hx   . Kidney disease Neg Hx   . Diabetes Neg Hx     Social History Social History   Tobacco Use  . Smoking status: Former Smoker    Years: 5.00    Types: Cigarettes, Pipe    Last attempt to quit: 09/07/1966    Years since quitting: 51.4  . Smokeless tobacco: Never Used  Substance Use Topics  . Alcohol use: No    Alcohol/week: 0.0 standard drinks  . Drug use: No     Allergies   Acyclovir and related   Review of Systems Review of Systems  Constitutional: Negative for appetite change.  HENT: Negative for congestion.   Respiratory: Negative for shortness of breath.   Gastrointestinal: Negative for abdominal pain.  Genitourinary:  Negative for frequency.  Musculoskeletal: Positive for gait problem. Negative for back pain.       Right hip pain.  Skin: Negative for rash.  Neurological: Negative for weakness.  Hematological: Bruises/bleeds easily.  Psychiatric/Behavioral: Negative for confusion.     Physical Exam Updated Vital Signs BP (!) 124/91   Pulse 83   Resp 15   Ht 6\' 1"  (1.854 m)   Wt 90.7 kg   SpO2 100%   BMI 26.39 kg/m   Physical Exam  Constitutional: He appears well-developed.  HENT:  Head: Normocephalic and atraumatic.  Neck: Neck supple.  Cardiovascular: Normal rate.  Abdominal: There is no  tenderness.  Musculoskeletal:  Small skin tear to right elbow.  No underlying bony tenderness and has good range of motion.  Tenderness of her right hip laterally and somewhat posteriorly.  Somewhat decreased range of motion due to pain.  No tenderness over knee or foot.  Vascularly intact in right foot but states he has a foot drop on that side.  States that is been there for weeks now and he has a brace.  Neurological: He is alert.  Skin: Skin is warm. Capillary refill takes less than 2 seconds.     ED Treatments / Results  Labs (all labs ordered are listed, but only abnormal results are displayed) Labs Reviewed  BASIC METABOLIC PANEL - Abnormal; Notable for the following components:      Result Value   BUN 33 (*)    Creatinine, Ser 2.02 (*)    GFR calc non Af Amer 30 (*)    GFR calc Af Amer 34 (*)    All other components within normal limits  CBC WITH DIFFERENTIAL/PLATELET - Abnormal; Notable for the following components:   Platelets 132 (*)    Lymphs Abs 0.6 (*)    All other components within normal limits  PROTIME-INR    EKG EKG Interpretation  Date/Time:  Thursday January 29 2018 19:45:47 EDT Ventricular Rate:  84 PR Interval:    QRS Duration: 93 QT Interval:  364 QTC Calculation: 431 R Axis:   68 Text Interpretation:  Sinus rhythm Consider left atrial enlargement Confirmed by  Davonna Belling [phone]) on 01/29/2018 8:43:28 PM   Radiology Dg Chest Port 1 View  Result Date: 01/29/2018 CLINICAL DATA:  82 y/o  M; left hip fracture today. Former smoker. EXAM: PORTABLE CHEST 1 VIEW COMPARISON:  10/23/2017 chest radiograph. FINDINGS: The heart size and mediastinal contours are within normal limits. Loop recorder device noted. Both lungs are clear. The visualized skeletal structures are unremarkable. IMPRESSION: No active disease. Electronically Signed   By: Kristine Garbe M.D.   On: 01/29/2018 20:04   Dg Hip Unilat W Or Wo Pelvis 2-3 Views Right  Result Date: 01/29/2018 CLINICAL DATA:  Fall with hip pain EXAM: DG HIP (WITH OR WITHOUT PELVIS) 2-3V RIGHT COMPARISON:  08/14/2017 FINDINGS: Status post left hip replacement with normal alignment and intact hardware. Pubic symphysis is intact. Rami are without acute displaced fracture. Acute mildly displaced right subcapital femoral neck fracture. No femoral head dislocation. IMPRESSION: 1. Acute right subcapital femoral neck fracture 2. Prior left hip replacement with normal alignment. Electronically Signed   By: Donavan Foil M.D.   On: 01/29/2018 19:19    Procedures Procedures (including critical care time)  Medications Ordered in ED Medications  0.9 %  sodium chloride infusion ( Intravenous New Bag/Given 01/29/18 1958)     Initial Impression / Assessment and Plan / ED Course  I have reviewed the triage vital signs and the nursing notes.  Pertinent labs & imaging results that were available during my care of the patient were reviewed by me and considered in my medical decision making (see chart for details).     Patient with fall.  Right hip fracture.  Subcapital.  No other injury.  Did not hit head.  He is however on Eliquis and likely will not be able to have surgery immediately.  Discussed with Dr. Delrae Sawyers, from [facility].  Patient has seen Dr. [name] in the past and would like to have him or someone  else with that group.  Requests admission to the  hospital service at [facility].  Final Clinical Impressions(s) / ED Diagnoses   Final diagnoses:  Fall, initial encounter  Closed subcapital fracture of right femur, initial encounter [facility])    ED Discharge Orders    None       Davonna Belling, MD 01/29/18 2044

## 2018-01-29 NOTE — ED Triage Notes (Signed)
Pt c/o fall from standing landing on right hip c/o pain

## 2018-01-30 ENCOUNTER — Encounter (HOSPITAL_COMMUNITY): Payer: Self-pay | Admitting: Family Medicine

## 2018-01-30 DIAGNOSIS — I480 Paroxysmal atrial fibrillation: Secondary | ICD-10-CM | POA: Diagnosis present

## 2018-01-30 LAB — BASIC METABOLIC PANEL
Anion gap: 8 (ref 5–15)
BUN: 29 mg/dL — ABNORMAL HIGH (ref 8–23)
CO2: 24 mmol/L (ref 22–32)
Calcium: 8.9 mg/dL (ref 8.9–10.3)
Chloride: 108 mmol/L (ref 98–111)
Creatinine, Ser: 1.94 mg/dL — ABNORMAL HIGH (ref 0.61–1.24)
GFR calc Af Amer: 36 mL/min — ABNORMAL LOW (ref >60)
GFR calc non Af Amer: 31 mL/min — ABNORMAL LOW (ref >60)
Glucose, Bld: 119 mg/dL — ABNORMAL HIGH (ref 70–99)
Potassium: 4.2 mmol/L (ref 3.5–5.1)
Sodium: 140 mmol/L (ref 135–145)

## 2018-01-30 LAB — CBC
HCT: 41.4 % (ref 39.0–52.0)
Hemoglobin: 13.5 g/dL (ref 13.0–17.0)
MCH: 30.5 pg (ref 26.0–34.0)
MCHC: 32.6 g/dL (ref 30.0–36.0)
MCV: 93.5 fL (ref 80.0–100.0)
Platelets: 132 10*3/uL — ABNORMAL LOW (ref 150–400)
RBC: 4.43 MIL/uL (ref 4.22–5.81)
RDW: 13.4 % (ref 11.5–15.5)
WBC: 8.1 10*3/uL (ref 4.0–10.5)
nRBC: 0 % (ref 0.0–0.2)

## 2018-01-30 LAB — APTT: aPTT: 138 seconds — ABNORMAL HIGH (ref 24–36)

## 2018-01-30 LAB — SURGICAL PCR SCREEN
MRSA, PCR: NEGATIVE
Staphylococcus aureus: NEGATIVE

## 2018-01-30 LAB — ABO/RH: ABO/RH(D): B POS

## 2018-01-30 LAB — HEPARIN LEVEL (UNFRACTIONATED): Heparin Unfractionated: 1.18 IU/mL — ABNORMAL HIGH (ref 0.30–0.70)

## 2018-01-30 MED ORDER — SENNOSIDES-DOCUSATE SODIUM 8.6-50 MG PO TABS: 1.0000 | ORAL_TABLET | Freq: Every evening | ORAL | Status: DC | PRN

## 2018-01-30 MED ORDER — BISACODYL 5 MG PO TBEC: 5.0000 mg | DELAYED_RELEASE_TABLET | Freq: Every day | ORAL | Status: DC | PRN

## 2018-01-30 MED ORDER — VITAMIN D 1000 UNITS PO TABS
2000.0000 [IU] | ORAL_TABLET | Freq: Every day | ORAL | Status: DC
  Administered 2018-01-30 – 2018-02-03 (×5): 2000 [IU] via ORAL
  Filled 2018-01-30 (×5): qty 2

## 2018-01-30 MED ORDER — ENSURE ENLIVE PO LIQD
237.0000 mL | Freq: Two times a day (BID) | ORAL | Status: DC
  Administered 2018-01-30 – 2018-02-04 (×6): 237 mL via ORAL

## 2018-01-30 MED ORDER — SODIUM CHLORIDE 0.9 % IV SOLN
INTRAVENOUS | Status: DC
  Administered 2018-01-30: 03:00:00 via INTRAVENOUS

## 2018-01-30 MED ORDER — HEPARIN (PORCINE) IN NACL 100-0.45 UNIT/ML-% IJ SOLN
950.0000 [IU]/h | INTRAMUSCULAR | Status: AC
  Administered 2018-01-30 – 2018-01-31 (×2): 1050 [IU]/h via INTRAVENOUS
  Filled 2018-01-30: qty 250

## 2018-01-30 MED ORDER — HEPARIN (PORCINE) IN NACL 100-0.45 UNIT/ML-% IJ SOLN
1200.0000 [IU]/h | INTRAMUSCULAR | Status: DC
  Administered 2018-01-30: 1200 [IU]/h via INTRAVENOUS
  Filled 2018-01-30 (×2): qty 250

## 2018-01-30 MED ORDER — HEPARIN BOLUS VIA INFUSION
4000.0000 [IU] | Freq: Once | INTRAVENOUS | Status: AC
  Administered 2018-01-30: 4000 [IU] via INTRAVENOUS
  Filled 2018-01-30: qty 4000

## 2018-01-30 MED ORDER — BACLOFEN 10 MG PO TABS
10.0000 mg | ORAL_TABLET | Freq: Three times a day (TID) | ORAL | Status: DC | PRN
  Administered 2018-01-30 – 2018-02-04 (×7): 10 mg via ORAL
  Filled 2018-01-30 (×6): qty 1

## 2018-01-30 MED ORDER — TAMSULOSIN HCL 0.4 MG PO CAPS
0.4000 mg | ORAL_CAPSULE | Freq: Every day | ORAL | Status: DC
  Administered 2018-01-30 – 2018-02-04 (×5): 0.4 mg via ORAL
  Filled 2018-01-30 (×6): qty 1

## 2018-01-30 MED ORDER — HYDROCODONE-ACETAMINOPHEN 5-325 MG PO TABS
1.0000 | ORAL_TABLET | Freq: Four times a day (QID) | ORAL | Status: DC | PRN
  Administered 2018-01-30 – 2018-01-31 (×3): 1 via ORAL
  Filled 2018-01-30 (×3): qty 1

## 2018-01-30 MED ORDER — SENNOSIDES-DOCUSATE SODIUM 8.6-50 MG PO TABS
1.0000 | ORAL_TABLET | Freq: Two times a day (BID) | ORAL | Status: DC
  Administered 2018-01-30 – 2018-02-04 (×10): 1 via ORAL
  Filled 2018-01-30 (×10): qty 1

## 2018-01-30 MED ORDER — SODIUM CHLORIDE 0.9 % IV SOLN: INTRAVENOUS | Status: DC

## 2018-01-30 MED ORDER — VITAMIN D 1000 UNITS PO TABS: 5000.0000 [IU] | ORAL_TABLET | Freq: Every day | ORAL | Status: DC

## 2018-01-30 NOTE — Progress Notes (Signed)
D/C heparin gtt at 2200 on Saturday 10/26 NPO after MN on Saturday night Surgery Sunday am

## 2018-01-30 NOTE — Consult Note (Signed)
Reason for Consult:R hip fx Referring Physician: Dr. [name] is an 82 y.o. male.  HPI: Jeffrey Hardy is a patient well known to our practice. He had a fall yesterday while walking on uneven ground and landed on his right hip. He presented to the ER and was found to have a femoral neck fx. He denies other injuries from his fall. Reports this morning he is comfortable lying in bed. He does report pain with motion of the hip. Has no other c/o. Earlier this year he underwent L total hip by Dr. [name] for L hip fx and did well. He just recently followed up for his left hip. His last dose of Eliquis was yesterday AM.  Past Medical History:  Diagnosis Date  . Anxiety   . Bilateral renal cysts   . Cancer ([place])    renal mass  . Depression   . Diverticulosis of colon   . Erectile dysfunction   . Hematuria   . History of cerebral embolic infarction x2 CVA's ---  neurologist-  [name]--- per pt residual abnormal gait , does need cane   12-16-2011  right frontal embolic infarct and [id number] left frontal embolic infarct (per first MRI previous hemorrgaic cerebral ischemia)  . History of duodenal ulcer    1997  . History of Helicobacter pylori infection    1997  . History of hiatal hernia   . History of loop recorder    Current  . History of syncope    1997  and 2014  s/p  loop recorder  . Hyperlipidemia   . Hypertension   . Nocturia   . PAF (paroxysmal atrial fibrillation) [facility])    cardiologist-  dr [name]  . Renal cell cancer ([place]) 09/06/2016  . Right renal mass   . Status post placement of implantable loop recorder    07-06-2012  . Stroke [facility])    2013 2 mild strokes mild weakness bil sides  . Urgency of urination   . Wears hearing aid    right ear only    Past Surgical History:  Procedure Laterality Date  . APPENDECTOMY  age 58  . COLONOSCOPY  last one 07-17-2015  . CYSTOSCOPY/RETROGRADE/URETEROSCOPY Right 04/22/2016   Procedure: CYSTOSCOPY/RETROGRADE/URETEROSCOPY/ BIOPSY/  STENT PLACEMENT;  Surgeon: [name], MD;  [facility];  Service: Urology;  Laterality: Right;  . ESOPHAGOGASTRODUODENOSCOPY  last one 09-16-2014  . LOOP RECORDER IMPLANT N/A 07/06/2012   Procedure: LOOP RECORDER IMPLANT;  Surgeon: [name], MD;  Location: [facility] CATH LAB;  Service: Cardiovascular;  Laterality: N/A;  . ROBOT ASSITED LAPAROSCOPIC NEPHROURETERECTOMY Right 05/13/2016   Procedure: XI ROBOT ASSITED LAPAROSCOPIC NEPHROURETERECTOMY;  Surgeon: [name], MD;  [facility];  Service: Urology;  Laterality: Right;  . TEE WITHOUT CARDIOVERSION  12/19/2011   Procedure: TRANSESOPHAGEAL ECHOCARDIOGRAM (TEE);  Surgeon: [name], MD;  Location: [facility] ENDOSCOPY;  Service: Cardiovascular;  Laterality: N/A;  no evidence thrombus in the atrial cavity or appendage;  mild AV thickened w/ mild AI;  minimal fixed plaque in thoracic aorta;  mild MV thickened w/ mild MR;  NO pfo by color doppler or with injection of agitated saline  . TOTAL HIP ARTHROPLASTY Left 09/07/2017   Procedure: TOTAL HIP ARTHROPLASTY ANTERIOR APPROACH;  Surgeon: Rod Can, MD;  [facility];  Service: Orthopedics;  Laterality: Left;  . TRANSTHORACIC ECHOCARDIOGRAM  09-10-20103   grade 1 diastolic dysfunction,  ef 55-60%/  AV sclerosis without stenosis/  trivial TR  .  WRIST FRACTURE SURGERY Right 2014  approx.   retained hardware    Family History  Problem Relation Age of Onset  . Heart Problems Mother   . Brain cancer Father   . Parkinsonism Brother   . Dementia Brother   . Colon cancer Neg Hx   . Esophageal cancer Neg Hx   . Stomach cancer Neg Hx   . Gastric cancer Neg Hx   . Liver disease Neg Hx   . Kidney disease Neg Hx   . Diabetes Neg Hx     Social History:  reports that he quit smoking about 51 years ago. His smoking use included cigarettes and pipe. He quit after 5.00 years of use. He has never used smokeless tobacco. He reports that he does not drink alcohol  or use drugs.  Allergies:  Allergies  Allergen Reactions  . Acyclovir And Related     Medications: I have reviewed the patient's current medications.  Results for orders placed or performed during the hospital encounter of 01/29/18 (from the past 48 hour(s))  Basic metabolic panel     Status: Abnormal   Collection Time: 01/29/18  7:47 PM  Result Value Ref Range   Sodium 139 135 - 145 mmol/L   Potassium 3.8 3.5 - 5.1 mmol/L   Chloride 103 98 - 111 mmol/L   CO2 25 22 - 32 mmol/L   Glucose, Bld 98 70 - 99 mg/dL   BUN 33 (H) 8 - 23 mg/dL   Creatinine, Ser 2.02 (H) 0.61 - 1.24 mg/dL   Calcium 9.3 8.9 - 10.3 mg/dL   GFR calc non Af Amer 30 (L) >60 mL/min   GFR calc Af Amer 34 (L) >60 mL/min    Comment: (NOTE) The eGFR has been calculated using the CKD EPI equation. This calculation has not been validated in all clinical situations. eGFR's persistently <60 mL/min signify possible Chronic Kidney Disease.    Anion gap 11 5 - 15    Comment: Performed at [facility], [address] [place], [place] [address]  CBC WITH DIFFERENTIAL     Status: Abnormal   Collection Time: 01/29/18  7:47 PM  Result Value Ref Range   WBC 8.5 4.0 - 10.5 K/uL   RBC 4.61 4.22 - 5.81 MIL/uL   Hemoglobin 14.4 13.0 - 17.0 g/dL   HCT 43.6 39.0 - 52.0 %   MCV 94.6 80.0 - 100.0 fL   MCH 31.2 26.0 - 34.0 pg   MCHC 33.0 30.0 - 36.0 g/dL   RDW 13.7 11.5 - 15.5 %   Platelets 132 (L) 150 - 400 K/uL   nRBC 0.0 0.0 - 0.2 %   Neutrophils Relative % 86 %   Neutro Abs 7.3 1.7 - 7.7 K/uL   Lymphocytes Relative 7 %   Lymphs Abs 0.6 (L) 0.7 - 4.0 K/uL   Monocytes Relative 5 %   Monocytes Absolute 0.5 0.1 - 1.0 K/uL   Eosinophils Relative 1 %   Eosinophils Absolute 0.1 0.0 - 0.5 K/uL   Basophils Relative 0 %   Basophils Absolute 0.0 0.0 - 0.1 K/uL   Immature Granulocytes 1 %   Abs Immature Granulocytes 0.07 0.00 - 0.07 K/uL    Comment: Performed at [facility], [address] [place], [place] [address]  Protime-INR     Status: None   Collection Time: 01/29/18  7:47 PM  Result Value Ref Range   Prothrombin Time 15.0 11.4 - 15.2 seconds  INR 1.19     Comment: Performed at [facility], [address] [place], [place] [address]  Surgical PCR screen     Status: None   Collection Time: 01/30/18  2:17 AM  Result Value Ref Range   MRSA, PCR NEGATIVE NEGATIVE   Staphylococcus aureus NEGATIVE NEGATIVE    Comment: (NOTE) The Xpert SA Assay (FDA approved for NASAL specimens in patients 64 years of age and older), is one component of a comprehensive surveillance program. It is not intended to diagnose infection nor to guide or monitor treatment. Performed at [facility], [place] [address] [place], [place] [address]   Basic metabolic panel     Status: Abnormal   Collection Time: 01/30/18  3:10 AM  Result Value Ref Range   Sodium 140 135 - 145 mmol/L   Potassium 4.2 3.5 - 5.1 mmol/L   Chloride 108 98 - 111 mmol/L   CO2 24 22 - 32 mmol/L   Glucose, Bld 119 (H) 70 - 99 mg/dL   BUN 29 (H) 8 - 23 mg/dL   Creatinine, Ser 1.94 (H) 0.61 - 1.24 mg/dL   Calcium 8.9 8.9 - 10.3 mg/dL   GFR calc non Af Amer 31 (L) >60 mL/min   GFR calc Af Amer 36 (L) >60 mL/min    Comment: (NOTE) The eGFR has been calculated using the CKD EPI equation. This calculation has not been validated in all clinical situations. eGFR's persistently <60 mL/min signify possible Chronic Kidney Disease.    Anion gap 8 5 - 15    Comment: Performed at [facility] [address] [place], [place] [address]  CBC     Status: Abnormal   Collection Time: 01/30/18  3:10 AM  Result Value Ref Range   WBC 8.1 4.0 - 10.5 K/uL   RBC 4.43 4.22 - 5.81 MIL/uL   Hemoglobin 13.5 13.0 - 17.0 g/dL   HCT 41.4 39.0 - 52.0 %   MCV 93.5 80.0 - 100.0 fL   MCH 30.5 26.0 - 34.0 pg   MCHC 32.6 30.0 - 36.0 g/dL   RDW 13.4 11.5 - 15.5 %   Platelets 132 (L) 150 - 400 K/uL   nRBC 0.0 0.0 - 0.2 %    Comment:  Performed at [facility], [place] [address] [place], [place] [address]  Type and screen [place]     Status: None   Collection Time: 01/30/18  3:10 AM  Result Value Ref Range   ABO/RH(D) B POS    Antibody Screen NEG    Sample Expiration      02/02/2018 Performed at [facility], [place] [address], [place], [place] [address]   ABO/Rh     Status: None (Preliminary result)   Collection Time: 01/30/18  3:10 AM  Result Value Ref Range   ABO/RH(D)      B POS Performed at [facility] [address], [place], [place] [address]     Dg Chest Port 1 View  Result Date: 01/29/2018 CLINICAL DATA:  82 y/o  M; left hip fracture today. Former smoker. EXAM: PORTABLE CHEST 1 VIEW COMPARISON:  10/23/2017 chest radiograph. FINDINGS: The heart size and mediastinal contours are within normal limits. Loop recorder device noted. Both lungs are clear. The visualized skeletal structures are unremarkable. IMPRESSION: No active disease. Electronically Signed   By: Kristine Garbe M.D.   On: 01/29/2018 20:04   Dg Hip Unilat W  Or Wo Pelvis 2-3 Views Right  Result Date: 01/29/2018 CLINICAL DATA:  Fall with hip pain EXAM: DG HIP (WITH OR WITHOUT PELVIS) 2-3V RIGHT COMPARISON:  08/14/2017 FINDINGS: Status post left hip replacement with normal alignment and intact hardware. Pubic symphysis is intact. Rami are without acute displaced fracture. Acute mildly displaced right subcapital femoral neck fracture. No femoral head dislocation. IMPRESSION: 1. Acute right subcapital femoral neck fracture 2. Prior left hip replacement with normal alignment. Electronically Signed   By: Donavan Foil M.D.   On: 01/29/2018 19:19    Review of Systems  Constitutional: Negative.   HENT: Negative.   Eyes: Negative.   Respiratory: Negative.   Cardiovascular: Negative.   Gastrointestinal: Negative.   Genitourinary: Negative.   Musculoskeletal: Positive for joint pain.  Skin: Negative.    Neurological: Negative.    Blood pressure 126/73, pulse 74, temperature 98.2 F (36.8 C), temperature source Oral, resp. rate 17, height '6\' 1"'  (1.854 m), weight 87.2 kg, SpO2 96 %. Physical Exam  Constitutional: He is oriented to person, place, and time. He appears well-developed and well-nourished.  HENT:  Head: Normocephalic.  Eyes: Pupils are equal, round, and reactive to light.  Neck: Normal range of motion.  Cardiovascular: Normal rate.  Respiratory: Effort normal.  GI: Soft.  Musculoskeletal:  R leg shortened and externally rotated No calf pain NVI distally Sensation intact distally Pain with ROM R hip  Neurological: He is alert and oriented to person, place, and time.  Skin: Skin is warm and dry.   Xrays with R hip subcapital femoral neck fx. L THA in good alignment.  Assessment/Plan: R femoral neck fx  Plan: Admitted to hospitalist service R hip will require total hip as definitive tx Hold Eliquis, last dose yesterday Plan surgery for Sunday with Dr. [name] at [facility], [place] Discussed plan with pt, he is in agreement, all questions answered Discussed post-op protocols Keep NPO after MN for surgery Sunday  Helen Cuff M. 01/30/2018, 9:49 AM

## 2018-01-30 NOTE — Progress Notes (Signed)
ANTICOAGULATION CONSULT NOTE - Initial Consult  Pharmacy Consult for Heparin   Indication: atrial fibrillation  Allergies  Allergen Reactions  . Acyclovir And Related     Patient Measurements: Height: 6\' 1"  (185.4 cm) Weight: 192 lb 3.9 oz (87.2 kg) IBW/kg (Calculated) : 79.9  Vital Signs: Temp: 98.2 F (36.8 C) (10/25 0414) Temp Source: Oral (10/25 0414) BP: 126/73 (10/25 0414) Pulse Rate: 74 (10/25 0414)  Labs: Recent Labs    01/29/18 1947 01/30/18 0310  HGB 14.4 13.5  HCT 43.6 41.4  PLT 132* 132*  LABPROT 15.0  --   INR 1.19  --   CREATININE 2.02* 1.94*    Estimated Creatinine Clearance: 34.9 mL/min (A) (by C-G formula based on SCr of 1.94 mg/dL (H)).   Medical History: Past Medical History:  Diagnosis Date  . Anxiety   . Bilateral renal cysts   . Cancer ([place])    renal mass  . Depression   . Diverticulosis of colon   . Erectile dysfunction   . Hematuria   . History of cerebral embolic infarction x2 CVA's ---  neurologist-  [name]--- per pt residual abnormal gait , does need cane   12-16-2011  right frontal embolic infarct and [phone] left frontal embolic infarct (per first MRI previous hemorrgaic cerebral ischemia)  . History of duodenal ulcer    1997  . History of Helicobacter pylori infection    1997  . History of hiatal hernia   . History of loop recorder    Current  . History of syncope    1997  and 2014  s/p  loop recorder  . Hyperlipidemia   . Hypertension   . Nocturia   . PAF (paroxysmal atrial fibrillation) [facility])    cardiologist-  dr [name]  . Renal cell cancer ([place]) 09/06/2016  . Right renal mass   . Status post placement of implantable loop recorder    07-06-2012  . Stroke [facility])    2013 2 mild strokes mild weakness bil sides  . Urgency of urination   . Wears hearing aid    right ear only    Assessment: 82 y.o. male. had a fall yesterday while walking on uneven ground and landed on his right hip. He presented to the ER and was  found to have a femoral neck fx. Patient was on Eliquis PTA for atrial fibrillation, pharmacy consulted to start heparin therapy until surgery on Sunday.  Goal of Therapy:  Heparin level 0.3-0.7 units/ml Monitor platelets by anticoagulation protocol: Yes   Plan:  Give 4000 units bolus x 1 Start heparin infusion at 1200 units/hr Check anti-Xa level in 8 hours and daily while on heparin Continue to monitor H&H and platelets  Alanda Slim, PharmD, [facility] Clinical Pharmacist Please see AMION for all Pharmacists' Contact Phone Numbers 01/30/2018, 11:24 AM

## 2018-01-30 NOTE — Progress Notes (Signed)
Initial Nutrition Assessment  DOCUMENTATION CODES:   Not applicable  INTERVENTION:  -Ensure Enlive BID. Each supplement provides 350 kcal and 20 grams protein. Patient prefers chocolate or vanilla flavor (no strawberry).  NUTRITION DIAGNOSIS:   Increased nutrient needs related to hip fracture as evidenced by estimated needs.  GOAL:   Patient will meet greater than or equal to 90% of their needs  MONITOR:   PO intake, Supplement acceptance, Weight trends, Labs  REASON FOR ASSESSMENT:   Consult Hip fracture protocol  ASSESSMENT:   Jeffrey Hardy is a 82 yo male with PMH of paroxysmal a fib, CVA, CKD III, s/p hip replacement 09/2017, chronic mild thrombocytopenia who presented to ED with severe right hip pain after fall. Radiographs confirmed acute right femoral neck fracture.     Visited pt at bedside with wife present. Pt with good appetite in hospital and PTA.  Wife states his usual intake as:  Breakfast: omelet, or muffin, or eggs and [place] bacon Lunch: salad and sandwich Dinner: meat with starch and vegetable- wife cooks Pt doesn't snack a lot and only drinks water, coffee, and tea sometimes Pt and his wife are cognizant of sodium intake (pt has one kidney; nephroureterectomy 05/2016).  Pt reports drinking Ensure after left hip replacement in June, and that he likes chocolate/vanilla flavors.   Pt and wife deny unintentional wt loss.  Pt with intractable hiccups during time of visit. Given PRN meds for this.   Educated pt on importance of adequate protein and nutrient intake to promote healing. Hip surgery to be Sunday 10/27.   Meds: vitamin D 2000 IU, senna Labs: CBGs 106-118  NUTRITION - FOCUSED PHYSICAL EXAM:    Most Recent Value  Orbital Region  No depletion  Upper Arm Region  Mild depletion  Thoracic and Lumbar Region  No depletion  Buccal Region  No depletion  Temple Region  No depletion  Clavicle Bone Region  No depletion  Clavicle and Acromion Bone Region   No depletion  Scapular Bone Region  No depletion  Dorsal Hand  No depletion  Patellar Region  No depletion  Anterior Thigh Region  No depletion  Posterior Calf Region  No depletion  Edema (RD Assessment)  Mild  Hair  Reviewed  Eyes  Reviewed  Mouth  Reviewed  Skin  Reviewed  Nails  Reviewed       Diet Order:   Diet Order            Diet Heart Room service appropriate? Yes; Fluid consistency: Thin  Diet effective now              EDUCATION NEEDS:   Education needs have been addressed  Skin:  Skin Assessment: Reviewed RN Assessment  Last BM:  10/24  Height:   Ht Readings from Last 1 Encounters:  01/29/18 6\' 1"  (1.854 m)    Weight:   Wt Readings from Last 1 Encounters:  01/29/18 87.2 kg    Ideal Body Weight:  83.6 kg  BMI:  Body mass index is 25.36 kg/m.  Estimated Nutritional Needs:   Kcal:  2100-2300  Protein:  105-115 gms  Fluid:  >/=2.1 L    Jeffrey Hardy, Dietetic Intern [phone]

## 2018-01-30 NOTE — H&P (Signed)
History and Physical    ANEESH FALLER TOI:[id number] DOB: November 16, 1935 DOA: 01/29/2018  PCP: Biagio Borg, MD   Patient coming from: Home, by way of [facility]    Chief Complaint: Fall with right hip pain   HPI: Jeffrey Hardy is a 82 y.o. male with medical history significant for paroxysmal atrial fibrillation on Eliquis, chronic mild thrombocytopenia, CVA, and chronic kidney disease stage III, now presenting to the emergency department with severe right hip pain and inability to bear weight after a fall.  Patient reports that he was in his usual state of health, having an uneventful day, and was walking out to his barn, started up a knoll, and lost his balance, falling onto his right side.  He experienced immediate and severe pain at the right hip and was unable to bear weight.  Reports that he is fairly active at home, able to ascend a flight of stairs without any significant shortness of breath, and reports that he never experiences chest pain.  No recent fevers, chills, cough, dyspnea, melena, or hematochezia.  [facility] ED Course: Upon arrival to the ED, patient is found to be afebrile, saturating well on room air, and with vitals otherwise normal.  EKG features a sinus rhythm and chest x-ray is negative for acute cardiopulmonary disease.  Radiographs of the hips and pelvis demonstrate acute right subcapital femoral neck fracture.  Chemistry panel features a creatinine of 2.02, similar to priors.  CBC is notable for a slight thrombocytopenia.  Orthopedic surgery was consulted by the ED physician and a medical admission to [facility] was recommended.  Review of Systems:  All other systems reviewed and apart from HPI, are negative.  Past Medical History:  Diagnosis Date  . Anxiety   . Bilateral renal cysts   . Cancer ([place])    renal mass  . Depression   . Diverticulosis of colon   . Erectile dysfunction   . Hematuria   . History of cerebral embolic infarction x2 CVA's  ---  neurologist-  [name]--- per pt residual abnormal gait , does need cane   12-16-2011  right frontal embolic infarct and [id number] left frontal embolic infarct (per first MRI previous hemorrgaic cerebral ischemia)  . History of duodenal ulcer    1997  . History of Helicobacter pylori infection    1997  . History of hiatal hernia   . History of loop recorder    Current  . History of syncope    1997  and 2014  s/p  loop recorder  . Hyperlipidemia   . Hypertension   . Nocturia   . PAF (paroxysmal atrial fibrillation) [facility])    cardiologist-  dr [name]  . Renal cell cancer ([place]) 09/06/2016  . Right renal mass   . Status post placement of implantable loop recorder    07-06-2012  . Stroke [facility])    2013 2 mild strokes mild weakness bil sides  . Urgency of urination   . Wears hearing aid    right ear only    Past Surgical History:  Procedure Laterality Date  . APPENDECTOMY  age 69  . COLONOSCOPY  last one 07-17-2015  . CYSTOSCOPY/RETROGRADE/URETEROSCOPY Right 04/22/2016   Procedure: CYSTOSCOPY/RETROGRADE/URETEROSCOPY/ BIOPSY/ STENT PLACEMENT;  Surgeon: [name], MD;  Location: [facility];  Service: Urology;  Laterality: Right;  . ESOPHAGOGASTRODUODENOSCOPY  last one 09-16-2014  . LOOP RECORDER IMPLANT N/A 07/06/2012   Procedure: LOOP RECORDER IMPLANT;  Surgeon: Revonda Standard  [name], MD;  Location: [facility] CATH LAB;  Service: Cardiovascular;  Laterality: N/A;  . ROBOT ASSITED LAPAROSCOPIC NEPHROURETERECTOMY Right 05/13/2016   Procedure: XI ROBOT ASSITED LAPAROSCOPIC NEPHROURETERECTOMY;  Surgeon: [name], MD;  Location: [facility] ORS;  Service: Urology;  Laterality: Right;  . TEE WITHOUT CARDIOVERSION  12/19/2011   Procedure: TRANSESOPHAGEAL ECHOCARDIOGRAM (TEE);  Surgeon: [name], MD;  Location: [facility] ENDOSCOPY;  Service: Cardiovascular;  Laterality: N/A;  no evidence thrombus in the atrial cavity or appendage;  mild AV thickened w/ mild AI;  minimal fixed plaque in  thoracic aorta;  mild MV thickened w/ mild MR;  NO pfo by color doppler or with injection of agitated saline  . TOTAL HIP ARTHROPLASTY Left 09/07/2017   Procedure: TOTAL HIP ARTHROPLASTY ANTERIOR APPROACH;  Surgeon: Rod Can, MD;  Location: [facility]S;  Service: Orthopedics;  Laterality: Left;  . TRANSTHORACIC ECHOCARDIOGRAM  09-10-20103   grade 1 diastolic dysfunction,  ef 55-60%/  AV sclerosis without stenosis/  trivial TR  . WRIST FRACTURE SURGERY Right 2014  approx.   retained hardware     reports that he quit smoking about 51 years ago. His smoking use included cigarettes and pipe. He quit after 5.00 years of use. He has never used smokeless tobacco. He reports that he does not drink alcohol or use drugs.  Allergies  Allergen Reactions  . Acyclovir And Related     Family History  Problem Relation Age of Onset  . Heart Problems Mother   . Brain cancer Father   . Parkinsonism Brother   . Dementia Brother   . Colon cancer Neg Hx   . Esophageal cancer Neg Hx   . Stomach cancer Neg Hx   . Gastric cancer Neg Hx   . Liver disease Neg Hx   . Kidney disease Neg Hx   . Diabetes Neg Hx      Prior to Admission medications   Medication Sig Start Date End Date Taking? Authorizing Provider  apixaban (ELIQUIS) 2.5 MG TABS tablet Take 1 tablet (2.5 mg total) by mouth 2 (two) times daily. 01/21/18   Sherran Needs, NP  cholecalciferol (VITAMIN D) 1000 units tablet Take 5,000 Units by mouth at bedtime.     [provider]  ferrous sulfate 325 (65 FE) MG tablet Take 325 mg by mouth daily with breakfast.    [provider]  tamsulosin (FLOMAX) 0.4 MG CAPS capsule Take 1 capsule (0.4 mg total) by mouth daily after breakfast. 09/06/16   Biagio Borg, MD  Trospium Chloride 60 MG CP24 Take 1 capsule by mouth daily.    [provider]    Physical Exam: Vitals:   01/29/18 2225 01/29/18 2245 01/29/18 2255 01/29/18 2351  BP: 137/84 138/84 126/87 (!) 154/82  Pulse: 76  77 86 89  Resp: 16 (!) 23 15 15   Temp:    98.1 F (36.7 C)  TempSrc:    Oral  SpO2: 98% 97% 99%   Weight:    87.2 kg  Height:    6\' 1"  (1.854 m)    Constitutional: NAD, calm  Eyes: PERTLA, lids and conjunctivae normal ENMT: Mucous membranes are moist. Posterior pharynx clear of any exudate or lesions.   Neck: normal, supple, no masses, no thyromegaly Respiratory: clear to auscultation bilaterally, no wheezing, no crackles. Normal respiratory effort.    Cardiovascular: S1 & S2 heard, regular rate and rhythm. No extremity edema. 2+ pedal pulses.   Abdomen: No distension, no tenderness, soft. Bowel sounds  active.  Musculoskeletal: no clubbing / cyanosis. Tender right hip, neurovascularly intact distally.  Skin: no significant rashes, lesions, ulcers. Warm, dry, well-perfused. Neurologic: No facial asymmetry, gross hearing deficit. Sensation to light touch intact. Strength 5/5 in all 4 limbs.  Psychiatric: Alert and oriented to person, place, and time. Pleasant and cooperative.    Labs on Admission: I have personally reviewed following labs and imaging studies  CBC: Recent Labs  Lab 01/29/18 1947  WBC 8.5  NEUTROABS 7.3  HGB 14.4  HCT 43.6  MCV 94.6  PLT 163*   Basic Metabolic Panel: Recent Labs  Lab 01/29/18 1947  NA 139  K 3.8  CL 103  CO2 25  GLUCOSE 98  BUN 33*  CREATININE 2.02*  CALCIUM 9.3   GFR: Estimated Creatinine Clearance: 33.5 mL/min (A) (by C-G formula based on SCr of 2.02 mg/dL (H)). Liver Function Tests: No results for input(s): AST, ALT, ALKPHOS, BILITOT, PROT, ALBUMIN in the last 168 hours. No results for input(s): LIPASE, AMYLASE in the last 168 hours. No results for input(s): AMMONIA in the last 168 hours. Coagulation Profile: Recent Labs  Lab 01/29/18 1947  INR 1.19   Cardiac Enzymes: No results for input(s): CKTOTAL, CKMB, CKMBINDEX, TROPONINI in the last 168 hours. BNP (last 3 results) No results for input(s): PROBNP in the last 8760  hours. HbA1C: No results for input(s): HGBA1C in the last 72 hours. CBG: No results for input(s): GLUCAP in the last 168 hours. Lipid Profile: No results for input(s): CHOL, HDL, LDLCALC, TRIG, CHOLHDL, LDLDIRECT in the last 72 hours. Thyroid Function Tests: No results for input(s): TSH, T4TOTAL, FREET4, T3FREE, THYROIDAB in the last 72 hours. Anemia Panel: No results for input(s): VITAMINB12, FOLATE, FERRITIN, TIBC, IRON, RETICCTPCT in the last 72 hours. Urine analysis:    Component Value Date/Time   COLORURINE YELLOW 11/03/2017 1539   APPEARANCEUR CLEAR 11/03/2017 1539   LABSPEC 1.015 11/03/2017 1539   PHURINE 6.5 11/03/2017 1539   GLUCOSEU NEGATIVE 11/03/2017 1539   HGBUR NEGATIVE 11/03/2017 1539   BILIRUBINUR NEGATIVE 11/03/2017 1539   KETONESUR NEGATIVE 11/03/2017 1539   PROTEINUR NEGATIVE 01/04/2012 0851   UROBILINOGEN 0.2 11/03/2017 1539   NITRITE NEGATIVE 11/03/2017 1539   LEUKOCYTESUR NEGATIVE 11/03/2017 1539   Sepsis Labs: @LABRCNTIP (procalcitonin:4,lacticidven:4) )No results found for this or any previous visit (from the past 240 hour(s)).   Radiological Exams on Admission: Dg Chest Port 1 View  Result Date: 01/29/2018 CLINICAL DATA:  82 y/o  M; left hip fracture today. Former smoker. EXAM: PORTABLE CHEST 1 VIEW COMPARISON:  10/23/2017 chest radiograph. FINDINGS: The heart size and mediastinal contours are within normal limits. Loop recorder device noted. Both lungs are clear. The visualized skeletal structures are unremarkable. IMPRESSION: No active disease. Electronically Signed   By: Kristine Garbe M.D.   On: 01/29/2018 20:04   Dg Hip Unilat W Or Wo Pelvis 2-3 Views Right  Result Date: 01/29/2018 CLINICAL DATA:  Fall with hip pain EXAM: DG HIP (WITH OR WITHOUT PELVIS) 2-3V RIGHT COMPARISON:  08/14/2017 FINDINGS: Status post left hip replacement with normal alignment and intact hardware. Pubic symphysis is intact. Rami are without acute displaced  fracture. Acute mildly displaced right subcapital femoral neck fracture. No femoral head dislocation. IMPRESSION: 1. Acute right subcapital femoral neck fracture 2. Prior left hip replacement with normal alignment. Electronically Signed   By: Donavan Foil M.D.   On: 01/29/2018 19:19    EKG: Independently reviewed. Sinus rhythm.   Assessment/Plan   1. Right hip  fracture  - Presents with severe right hip pain after a fall  - Radiographs confirm acute right femoral neck fracture  - Orthopedic surgery consulting and much appreciated  - Based on the available data, Mr. Dunton presents an estimated 1.4% risk of perioperative MI or cardiac arrest per Melburn Hake al  - Hold Eliquis for 48-72 hrs prior to surgery, last dose was AM of 01/29/18, continue supportive care, pain-control    2. Paroxysmal atrial fibrillation  - In sinus rhythm on admission  - CHADS-VASc 4 (age x2, CVA x2)  - Eliquis held in preparation for surgery, last dose was AM of 01/29/18   3. CKD stage III  - SCr is 2.02 on admission, similar to priors  - He is status-post right nephrectomy for RCC  - Renally-dose medications, avoid dehydration    4. Thrombocytopenia  - Mild, chronic, no bleeding    DVT prophylaxis: SCD's, Eliquis pta  Code Status: Full  Family Communication: Discussed with patient  Consults called: Orthopedic surgery  Admission status: Inpatient     Vianne Bulls, MD [facility] Pager [phone]  If 7PM-7AM, please contact night-coverage [email or web address] Password [email or web address]  01/30/2018, 2:17 AM

## 2018-01-30 NOTE — Progress Notes (Signed)
PROGRESS NOTE    Jeffrey Hardy  SJG:[id number] DOB: [birth year] DOA: 01/29/2018 PCP: Biagio Borg, MD    Brief Narrative: Jeffrey Hardy is a 82 y.o. male with medical history significant for paroxysmal atrial fibrillation on Eliquis, chronic mild thrombocytopenia, CVA, and chronic kidney disease stage III, now presenting to the emergency department with severe right hip pain and inability to bear weight after a fall.  Patient reports that he was in his usual state of health, having an uneventful day, and was walking out to his barn, started up a knoll, and lost his balance, falling onto his right side.  He experienced immediate and severe pain at the right hip and was unable to bear weight.  Reports that he is fairly active at home, able to ascend a flight of stairs without any significant shortness of breath, and reports that he never experiences chest pain.  No recent fevers, chills, cough, dyspnea, melena, or hematochezia.  [facility] ED Course: Upon arrival to the ED, patient is found to be afebrile, saturating well on room air, and with vitals otherwise normal.  EKG features a sinus rhythm and chest x-ray is negative for acute cardiopulmonary disease.  Radiographs of the hips and pelvis demonstrate acute right subcapital femoral neck fracture.  Chemistry panel features a creatinine of 2.02, similar to priors.  CBC is notable for a slight thrombocytopenia.  Orthopedic surgery was consulted by the ED physician and a medical admission to [facility] was recommended.    Assessment & Plan:   Principal Problem:   Closed right hip fracture, initial encounter [facility]) Active Problems:   Thrombocytopenia (HCC)   Renal cell cancer (HCC)   CKD (chronic kidney disease) stage 3, GFR 30-59 ml/min (HCC)   PAF (paroxysmal atrial fibrillation) ([place])   1-Right hip fracture Was on eliquis prior to admission. Last dose 10-24. Plan for sx on Sunday morning.   2-Paroxysmal A fib;    Has prior history of stroke.  eliquis on hold for surgery.  Will bridge with heparin.   3-CKD stage III;  Cr baseline 2.  Continue to monitor.   4-Thrombocytopenia Monitor on heparin.   5-Hiccup;  Start baclofen PRN.   DVT prophylaxis: heparin  Code Status: full code.  Family Communication: wife at bedside.  Disposition Plan: remain in the hospital , awaiting sx, holding eliquis. Started on Heparin Gtt   Consultants:   Ortho    Procedures:   none   Antimicrobials:   none   Subjective: Denies chest pain or dyspnea. Complaining of hiccups.   Objective: Vitals:   01/29/18 2245 01/29/18 2255 01/29/18 2351 01/30/18 0414  BP: 138/84 126/87 (!) 154/82 126/73  Pulse: 77 86 89 74  Resp: (!) 23 15 15 17   Temp:   98.1 F (36.7 C) 98.2 F (36.8 C)  TempSrc:   Oral Oral  SpO2: 97% 99%  96%  Weight:   87.2 kg   Height:   6\' 1"  (1.854 m)     Intake/Output Summary (Last 24 hours) at 01/30/2018 1115 Last data filed at 01/30/2018 0400 Gross per 24 hour  Intake 903.71 ml  Output 150 ml  Net 753.71 ml   Filed Weights   01/29/18 1823 01/29/18 2351  Weight: 90.7 kg 87.2 kg    Examination:  General exam: Appears calm and comfortable  Respiratory system: Clear to auscultation. Respiratory effort normal. Cardiovascular system: S1 & S2 heard, RRR. No JVD, murmurs, rubs, gallops or clicks. No pedal  edema. Gastrointestinal system: Abdomen is nondistended, soft and nontender. No organomegaly or masses felt. Normal bowel sounds heard. Central nervous system: Alert and oriented. No focal neurological deficits. Extremities:  Right le limit mobility.  Skin: No rashes, lesions or ulcers Psychiatry: Judgement and insight appear normal. Mood & affect appropriate.     Data Reviewed: I have personally reviewed following labs and imaging studies  CBC: Recent Labs  Lab 01/29/18 1947 01/30/18 0310  WBC 8.5 8.1  NEUTROABS 7.3  --   HGB 14.4 13.5  HCT 43.6 41.4  MCV  94.6 93.5  PLT 132* 573*   Basic Metabolic Panel: Recent Labs  Lab 01/29/18 1947 01/30/18 0310  NA 139 140  K 3.8 4.2  CL 103 108  CO2 25 24  GLUCOSE 98 119*  BUN 33* 29*  CREATININE 2.02* 1.94*  CALCIUM 9.3 8.9   GFR: Estimated Creatinine Clearance: 34.9 mL/min (A) (by C-G formula based on SCr of 1.94 mg/dL (H)). Liver Function Tests: No results for input(s): AST, ALT, ALKPHOS, BILITOT, PROT, ALBUMIN in the last 168 hours. No results for input(s): LIPASE, AMYLASE in the last 168 hours. No results for input(s): AMMONIA in the last 168 hours. Coagulation Profile: Recent Labs  Lab 01/29/18 1947  INR 1.19   Cardiac Enzymes: No results for input(s): CKTOTAL, CKMB, CKMBINDEX, TROPONINI in the last 168 hours. BNP (last 3 results) No results for input(s): PROBNP in the last 8760 hours. HbA1C: No results for input(s): HGBA1C in the last 72 hours. CBG: No results for input(s): GLUCAP in the last 168 hours. Lipid Profile: No results for input(s): CHOL, HDL, LDLCALC, TRIG, CHOLHDL, LDLDIRECT in the last 72 hours. Thyroid Function Tests: No results for input(s): TSH, T4TOTAL, FREET4, T3FREE, THYROIDAB in the last 72 hours. Anemia Panel: No results for input(s): VITAMINB12, FOLATE, FERRITIN, TIBC, IRON, RETICCTPCT in the last 72 hours. Sepsis Labs: No results for input(s): PROCALCITON, LATICACIDVEN in the last 168 hours.  Recent Results (from the past 240 hour(s))  Surgical PCR screen     Status: None   Collection Time: 01/30/18  2:17 AM  Result Value Ref Range Status   MRSA, PCR NEGATIVE NEGATIVE Final   Staphylococcus aureus NEGATIVE NEGATIVE Final    Comment: (NOTE) The Xpert SA Assay (FDA approved for NASAL specimens in patients 79 years of age and older), is one component of a comprehensive surveillance program. It is not intended to diagnose infection nor to guide or monitor treatment. Performed at [facility], [place] [address], [place], [place] [address]           Radiology Studies: Dg Chest Port 1 View  Result Date: 01/29/2018 CLINICAL DATA:  82 y/o  M; left hip fracture today. Former smoker. EXAM: PORTABLE CHEST 1 VIEW COMPARISON:  10/23/2017 chest radiograph. FINDINGS: The heart size and mediastinal contours are within normal limits. Loop recorder device noted. Both lungs are clear. The visualized skeletal structures are unremarkable. IMPRESSION: No active disease. Electronically Signed   By: Kristine Garbe M.D.   On: 01/29/2018 20:04   Dg Hip Unilat W Or Wo Pelvis 2-3 Views Right  Result Date: 01/29/2018 CLINICAL DATA:  Fall with hip pain EXAM: DG HIP (WITH OR WITHOUT PELVIS) 2-3V RIGHT COMPARISON:  08/14/2017 FINDINGS: Status post left hip replacement with normal alignment and intact hardware. Pubic symphysis is intact. Rami are without acute displaced fracture. Acute mildly displaced right subcapital femoral neck fracture. No femoral head dislocation. IMPRESSION: 1. Acute right subcapital femoral neck fracture  2. Prior left hip replacement with normal alignment. Electronically Signed   By: Donavan Foil M.D.   On: 01/29/2018 19:19        Scheduled Meds: . cholecalciferol  2,000 Units Oral QHS  . senna-docusate  1 tablet Oral BID  . tamsulosin  0.4 mg Oral QPC breakfast   Continuous Infusions:   LOS: 1 day    Time spent: 35 minutes,     Elmarie Shiley, MD [facility] Pager [phone]  If 7PM-7AM, please contact night-coverage [email or web address] Password [email or web address] 01/30/2018, 11:15 AM

## 2018-01-30 NOTE — Plan of Care (Signed)

## 2018-01-30 NOTE — Progress Notes (Signed)
ANTICOAGULATION CONSULT NOTE - Initial Consult  Pharmacy Consult for Heparin   Indication: atrial fibrillation  Allergies  Allergen Reactions  . Acyclovir And Related Other (See Comments)    Unsure of what happens    Patient Measurements: Height: 6\' 1"  (185.4 cm) Weight: 192 lb 3.9 oz (87.2 kg) IBW/kg (Calculated) : 79.9  Vital Signs: Temp: 99 F (37.2 C) (10/25 2007) Temp Source: Oral (10/25 2007) BP: 118/74 (10/25 2007) Pulse Rate: 79 (10/25 2007)  Labs: Recent Labs    01/29/18 1947 01/30/18 0310 01/30/18 1958  HGB 14.4 13.5  --   HCT 43.6 41.4  --   PLT 132* 132*  --   APTT  --   --  138*  LABPROT 15.0  --   --   INR 1.19  --   --   HEPARINUNFRC  --   --  1.18*  CREATININE 2.02* 1.94*  --     Estimated Creatinine Clearance: 34.9 mL/min (A) (by C-G formula based on SCr of 1.94 mg/dL (H)).   Medical History: Past Medical History:  Diagnosis Date  . Anxiety   . Bilateral renal cysts   . Cancer ([place])    renal mass  . Depression   . Diverticulosis of colon   . Erectile dysfunction   . Hematuria   . History of cerebral embolic infarction x2 CVA's ---  neurologist-  [name]--- per pt residual abnormal gait , does need cane   12-16-2011  right frontal embolic infarct and [id number] left frontal embolic infarct (per first MRI previous hemorrgaic cerebral ischemia)  . History of duodenal ulcer    1997  . History of Helicobacter pylori infection    1997  . History of hiatal hernia   . History of loop recorder    Current  . History of syncope    1997  and 2014  s/p  loop recorder  . Hyperlipidemia   . Hypertension   . Nocturia   . PAF (paroxysmal atrial fibrillation) [facility])    cardiologist-  dr [name]  . Renal cell cancer ([place]) 09/06/2016  . Right renal mass   . Status post placement of implantable loop recorder    07-06-2012  . Stroke [facility])    2013 2 mild strokes mild weakness bil sides  . Urgency of urination   . Wears hearing aid    right ear only     Assessment: 82 y.o. male. had a fall yesterday while walking on uneven ground and landed on his right hip. He presented to the ER and was found to have a femoral neck fx. Patient was on Eliquis PTA for atrial fibrillation, pharmacy consulted to start heparin therapy until surgery on Sunday.  Heparin level 1.18 and aPTT 138 which ar both elevated. Will hol drip for 1 hour and decrease rate. Due to apixaban PTA the APTT and heparin levels may not correlate initially. Will continue to draw both for now.   Goal of Therapy:  Heparin level 0.3-0.7 units/ml  APTT 66-102 sec Monitor platelets by anticoagulation protocol: Yes   Plan:  Hold heparin for 1 hour Decrease heparin rate to 1050 units/hr Heparin level and APTT in 8 hours   Mattheu Brodersen A. Levada Dy, PharmD, [place] Pager: [phone] Please utilize Amion for appropriate phone number to reach the unit pharmacist ([place])   01/30/2018, 10:02 PM

## 2018-01-30 NOTE — H&P (View-Only) (Signed)
Reason for Consult:R hip fx Referring Physician: Dr. [name] is an 82 y.o. male.  HPI: Jeffrey Hardy is a patient well known to our practice. He had a fall yesterday while walking on uneven ground and landed on his right hip. He presented to the ER and was found to have a femoral neck fx. He denies other injuries from his fall. Reports this morning he is comfortable lying in bed. He does report pain with motion of the hip. Has no other c/o. Earlier this year he underwent L total hip by Dr. [name] for L hip fx and did well. He just recently followed up for his left hip. His last dose of Eliquis was yesterday AM.  Past Medical History:  Diagnosis Date  . Anxiety   . Bilateral renal cysts   . Cancer ([place])    renal mass  . Depression   . Diverticulosis of colon   . Erectile dysfunction   . Hematuria   . History of cerebral embolic infarction x2 CVA's ---  neurologist-  [name]--- per pt residual abnormal gait , does need cane   12-16-2011  right frontal embolic infarct and [id number] left frontal embolic infarct (per first MRI previous hemorrgaic cerebral ischemia)  . History of duodenal ulcer    1997  . History of Helicobacter pylori infection    1997  . History of hiatal hernia   . History of loop recorder    Current  . History of syncope    1997  and 2014  s/p  loop recorder  . Hyperlipidemia   . Hypertension   . Nocturia   . PAF (paroxysmal atrial fibrillation) [facility])    cardiologist-  dr [name]  . Renal cell cancer ([place]) 09/06/2016  . Right renal mass   . Status post placement of implantable loop recorder    07-06-2012  . Stroke [facility])    2013 2 mild strokes mild weakness bil sides  . Urgency of urination   . Wears hearing aid    right ear only    Past Surgical History:  Procedure Laterality Date  . APPENDECTOMY  age 51  . COLONOSCOPY  last one 07-17-2015  . CYSTOSCOPY/RETROGRADE/URETEROSCOPY Right 04/22/2016   Procedure: CYSTOSCOPY/RETROGRADE/URETEROSCOPY/ BIOPSY/  STENT PLACEMENT;  Surgeon: [name], MD;  Location: [facility];  Service: Urology;  Laterality: Right;  . ESOPHAGOGASTRODUODENOSCOPY  last one 09-16-2014  . LOOP RECORDER IMPLANT N/A 07/06/2012   Procedure: LOOP RECORDER IMPLANT;  Surgeon: [name], MD;  Location: [facility] CATH LAB;  Service: Cardiovascular;  Laterality: N/A;  . ROBOT ASSITED LAPAROSCOPIC NEPHROURETERECTOMY Right 05/13/2016   Procedure: XI ROBOT ASSITED LAPAROSCOPIC NEPHROURETERECTOMY;  Surgeon: [name], MD;  Location: [facility];  Service: Urology;  Laterality: Right;  . TEE WITHOUT CARDIOVERSION  12/19/2011   Procedure: TRANSESOPHAGEAL ECHOCARDIOGRAM (TEE);  Surgeon: [name], MD;  Location: [facility] ENDOSCOPY;  Service: Cardiovascular;  Laterality: N/A;  no evidence thrombus in the atrial cavity or appendage;  mild AV thickened w/ mild AI;  minimal fixed plaque in thoracic aorta;  mild MV thickened w/ mild MR;  NO pfo by color doppler or with injection of agitated saline  . TOTAL HIP ARTHROPLASTY Left 09/07/2017   Procedure: TOTAL HIP ARTHROPLASTY ANTERIOR APPROACH;  Surgeon: Rod Can, MD;  Location: [facility];  Service: Orthopedics;  Laterality: Left;  . TRANSTHORACIC ECHOCARDIOGRAM  09-10-20103   grade 1 diastolic dysfunction,  ef 55-60%/  AV sclerosis without stenosis/  trivial TR  .  WRIST FRACTURE SURGERY Right 2014  approx.   retained hardware    Family History  Problem Relation Age of Onset  . Heart Problems Mother   . Brain cancer Father   . Parkinsonism Brother   . Dementia Brother   . Colon cancer Neg Hx   . Esophageal cancer Neg Hx   . Stomach cancer Neg Hx   . Gastric cancer Neg Hx   . Liver disease Neg Hx   . Kidney disease Neg Hx   . Diabetes Neg Hx     Social History:  reports that he quit smoking about 51 years ago. His smoking use included cigarettes and pipe. He quit after 5.00 years of use. He has never used smokeless tobacco. He reports that he does not drink alcohol  or use drugs.  Allergies:  Allergies  Allergen Reactions  . Acyclovir And Related     Medications: I have reviewed the patient's current medications.  Results for orders placed or performed during the hospital encounter of 01/29/18 (from the past 48 hour(s))  Basic metabolic panel     Status: Abnormal   Collection Time: 01/29/18  7:47 PM  Result Value Ref Range   Sodium 139 135 - 145 mmol/L   Potassium 3.8 3.5 - 5.1 mmol/L   Chloride 103 98 - 111 mmol/L   CO2 25 22 - 32 mmol/L   Glucose, Bld 98 70 - 99 mg/dL   BUN 33 (H) 8 - 23 mg/dL   Creatinine, Ser 2.02 (H) 0.61 - 1.24 mg/dL   Calcium 9.3 8.9 - 10.3 mg/dL   GFR calc non Af Amer 30 (L) >60 mL/min   GFR calc Af Amer 34 (L) >60 mL/min    Comment: (NOTE) The eGFR has been calculated using the CKD EPI equation. This calculation has not been validated in all clinical situations. eGFR's persistently <60 mL/min signify possible Chronic Kidney Disease.    Anion gap 11 5 - 15    Comment: Performed at [facility], [address] [place], [place] [address]  CBC WITH DIFFERENTIAL     Status: Abnormal   Collection Time: 01/29/18  7:47 PM  Result Value Ref Range   WBC 8.5 4.0 - 10.5 K/uL   RBC 4.61 4.22 - 5.81 MIL/uL   Hemoglobin 14.4 13.0 - 17.0 g/dL   HCT 43.6 39.0 - 52.0 %   MCV 94.6 80.0 - 100.0 fL   MCH 31.2 26.0 - 34.0 pg   MCHC 33.0 30.0 - 36.0 g/dL   RDW 13.7 11.5 - 15.5 %   Platelets 132 (L) 150 - 400 K/uL   nRBC 0.0 0.0 - 0.2 %   Neutrophils Relative % 86 %   Neutro Abs 7.3 1.7 - 7.7 K/uL   Lymphocytes Relative 7 %   Lymphs Abs 0.6 (L) 0.7 - 4.0 K/uL   Monocytes Relative 5 %   Monocytes Absolute 0.5 0.1 - 1.0 K/uL   Eosinophils Relative 1 %   Eosinophils Absolute 0.1 0.0 - 0.5 K/uL   Basophils Relative 0 %   Basophils Absolute 0.0 0.0 - 0.1 K/uL   Immature Granulocytes 1 %   Abs Immature Granulocytes 0.07 0.00 - 0.07 K/uL    Comment: Performed at [facility], [address] [place], [place] [address]  Protime-INR     Status: None   Collection Time: 01/29/18  7:47 PM  Result Value Ref Range   Prothrombin Time 15.0 11.4 - 15.2 seconds  INR 1.19     Comment: Performed at [facility], [address] [place], [place] [address]  Surgical PCR screen     Status: None   Collection Time: 01/30/18  2:17 AM  Result Value Ref Range   MRSA, PCR NEGATIVE NEGATIVE   Staphylococcus aureus NEGATIVE NEGATIVE    Comment: (NOTE) The Xpert SA Assay (FDA approved for NASAL specimens in patients 4 years of age and older), is one component of a comprehensive surveillance program. It is not intended to diagnose infection nor to guide or monitor treatment. Performed at [facility], [place] [address], [place], [place] [address]   Basic metabolic panel     Status: Abnormal   Collection Time: 01/30/18  3:10 AM  Result Value Ref Range   Sodium 140 135 - 145 mmol/L   Potassium 4.2 3.5 - 5.1 mmol/L   Chloride 108 98 - 111 mmol/L   CO2 24 22 - 32 mmol/L   Glucose, Bld 119 (H) 70 - 99 mg/dL   BUN 29 (H) 8 - 23 mg/dL   Creatinine, Ser 1.94 (H) 0.61 - 1.24 mg/dL   Calcium 8.9 8.9 - 10.3 mg/dL   GFR calc non Af Amer 31 (L) >60 mL/min   GFR calc Af Amer 36 (L) >60 mL/min    Comment: (NOTE) The eGFR has been calculated using the CKD EPI equation. This calculation has not been validated in all clinical situations. eGFR's persistently <60 mL/min signify possible Chronic Kidney Disease.    Anion gap 8 5 - 15    Comment: Performed at [facility] [address], [place], [place] [address]  CBC     Status: Abnormal   Collection Time: 01/30/18  3:10 AM  Result Value Ref Range   WBC 8.1 4.0 - 10.5 K/uL   RBC 4.43 4.22 - 5.81 MIL/uL   Hemoglobin 13.5 13.0 - 17.0 g/dL   HCT 41.4 39.0 - 52.0 %   MCV 93.5 80.0 - 100.0 fL   MCH 30.5 26.0 - 34.0 pg   MCHC 32.6 30.0 - 36.0 g/dL   RDW 13.4 11.5 - 15.5 %   Platelets 132 (L) 150 - 400 K/uL   nRBC 0.0 0.0 - 0.2 %    Comment:  Performed at [facility], [place] [address] [place], [place] [address]  Type and screen [place]     Status: None   Collection Time: 01/30/18  3:10 AM  Result Value Ref Range   ABO/RH(D) B POS    Antibody Screen NEG    Sample Expiration      02/02/2018 Performed at [facility], [place] [address] [place], [place] [address]   ABO/Rh     Status: None (Preliminary result)   Collection Time: 01/30/18  3:10 AM  Result Value Ref Range   ABO/RH(D)      B POS Performed at [place] [address], [place], [place] [address]     Dg Chest Port 1 View  Result Date: 01/29/2018 CLINICAL DATA:  82 y/o  M; left hip fracture today. Former smoker. EXAM: PORTABLE CHEST 1 VIEW COMPARISON:  10/23/2017 chest radiograph. FINDINGS: The heart size and mediastinal contours are within normal limits. Loop recorder device noted. Both lungs are clear. The visualized skeletal structures are unremarkable. IMPRESSION: No active disease. Electronically Signed   By: Kristine Garbe M.D.   On: 01/29/2018 20:04   Dg Hip Unilat W  Or Wo Pelvis 2-3 Views Right  Result Date: 01/29/2018 CLINICAL DATA:  Fall with hip pain EXAM: DG HIP (WITH OR WITHOUT PELVIS) 2-3V RIGHT COMPARISON:  08/14/2017 FINDINGS: Status post left hip replacement with normal alignment and intact hardware. Pubic symphysis is intact. Rami are without acute displaced fracture. Acute mildly displaced right subcapital femoral neck fracture. No femoral head dislocation. IMPRESSION: 1. Acute right subcapital femoral neck fracture 2. Prior left hip replacement with normal alignment. Electronically Signed   By: Donavan Foil M.D.   On: 01/29/2018 19:19    Review of Systems  Constitutional: Negative.   HENT: Negative.   Eyes: Negative.   Respiratory: Negative.   Cardiovascular: Negative.   Gastrointestinal: Negative.   Genitourinary: Negative.   Musculoskeletal: Positive for joint pain.  Skin: Negative.    Neurological: Negative.    Blood pressure 126/73, pulse 74, temperature 98.2 F (36.8 C), temperature source Oral, resp. rate 17, height '6\' 1"'  (1.854 m), weight 87.2 kg, SpO2 96 %. Physical Exam  Constitutional: He is oriented to person, place, and time. He appears well-developed and well-nourished.  HENT:  Head: Normocephalic.  Eyes: Pupils are equal, round, and reactive to light.  Neck: Normal range of motion.  Cardiovascular: Normal rate.  Respiratory: Effort normal.  GI: Soft.  Musculoskeletal:  R leg shortened and externally rotated No calf pain NVI distally Sensation intact distally Pain with ROM R hip  Neurological: He is alert and oriented to person, place, and time.  Skin: Skin is warm and dry.   Xrays with R hip subcapital femoral neck fx. L THA in good alignment.  Assessment/Plan: R femoral neck fx  Plan: Admitted to hospitalist service R hip will require total hip as definitive tx Hold Eliquis, last dose yesterday Plan surgery for Sunday with Dr. [name] at [facility], [place] Discussed plan with pt, he is in agreement, all questions answered Discussed post-op protocols Keep NPO after MN for surgery Sunday  BISSELL, JACLYN M. 01/30/2018, 9:49 AM

## 2018-01-31 LAB — BASIC METABOLIC PANEL
Anion gap: 9 (ref 5–15)
BUN: 24 mg/dL — ABNORMAL HIGH (ref 8–23)
CO2: 21 mmol/L — ABNORMAL LOW (ref 22–32)
Calcium: 8.6 mg/dL — ABNORMAL LOW (ref 8.9–10.3)
Chloride: 109 mmol/L (ref 98–111)
Creatinine, Ser: 1.86 mg/dL — ABNORMAL HIGH (ref 0.61–1.24)
GFR calc Af Amer: 38 mL/min — ABNORMAL LOW (ref >60)
GFR calc non Af Amer: 33 mL/min — ABNORMAL LOW (ref >60)
Glucose, Bld: 104 mg/dL — ABNORMAL HIGH (ref 70–99)
Potassium: 3.9 mmol/L (ref 3.5–5.1)
Sodium: 139 mmol/L (ref 135–145)

## 2018-01-31 LAB — APTT
aPTT: 112 seconds — ABNORMAL HIGH (ref 24–36)
aPTT: 61 seconds — ABNORMAL HIGH (ref 24–36)

## 2018-01-31 LAB — HEPARIN LEVEL (UNFRACTIONATED): Heparin Unfractionated: 1.12 IU/mL — ABNORMAL HIGH (ref 0.30–0.70)

## 2018-01-31 LAB — CBC
HCT: 39.2 % (ref 39.0–52.0)
Hemoglobin: 12.9 g/dL — ABNORMAL LOW (ref 13.0–17.0)
MCH: 30.9 pg (ref 26.0–34.0)
MCHC: 32.9 g/dL (ref 30.0–36.0)
MCV: 94 fL (ref 80.0–100.0)
Platelets: DECREASED 10*3/uL (ref 150–400)
RBC: 4.17 MIL/uL — ABNORMAL LOW (ref 4.22–5.81)
RDW: 13.8 % (ref 11.5–15.5)
WBC: 6.2 10*3/uL (ref 4.0–10.5)
nRBC: 0 % (ref 0.0–0.2)

## 2018-01-31 MED ORDER — POLYETHYLENE GLYCOL 3350 17 G PO PACK
17.0000 g | PACK | Freq: Two times a day (BID) | ORAL | Status: DC
  Administered 2018-01-31 – 2018-02-04 (×8): 17 g via ORAL
  Filled 2018-01-31 (×7): qty 1

## 2018-01-31 NOTE — Progress Notes (Signed)
ANTICOAGULATION CONSULT NOTE - Initial Consult  Pharmacy Consult for Heparin   Indication: atrial fibrillation  Allergies  Allergen Reactions  . Acyclovir And Related Other (See Comments)    Unsure of what happens    Patient Measurements: Height: 6\' 1"  (185.4 cm) Weight: 192 lb 3.9 oz (87.2 kg) IBW/kg (Calculated) : 79.9  Vital Signs: Temp: 98.3 F (36.8 C) (10/26 1930) Temp Source: Oral (10/26 1930) BP: 145/94 (10/26 1930) Pulse Rate: 74 (10/26 1930)  Labs: Recent Labs    01/29/18 1947 01/30/18 0310 01/30/18 1958 01/31/18 0734 01/31/18 1839  HGB 14.4 13.5  --  12.9*  --   HCT 43.6 41.4  --  39.2  --   PLT 132* 132*  --  PLATELET CLUMPS NOTED ON SMEAR, COUNT APPEARS DECREASED  --   APTT  --   --  138* 112* 61*  LABPROT 15.0  --   --   --   --   INR 1.19  --   --   --   --   HEPARINUNFRC  --   --  1.18* 1.12*  --   CREATININE 2.02* 1.94*  --  1.86*  --     Estimated Creatinine Clearance: 36.4 mL/min (A) (by C-G formula based on SCr of 1.86 mg/dL (H)).   Medical History: Past Medical History:  Diagnosis Date  . Anxiety   . Bilateral renal cysts   . Cancer ([place])    renal mass  . Depression   . Diverticulosis of colon   . Erectile dysfunction   . Hematuria   . History of cerebral embolic infarction x2 CVA's ---  neurologist-  [name]--- per pt residual abnormal gait , does need cane   12-16-2011  right frontal embolic infarct and [id number] left frontal embolic infarct (per first MRI previous hemorrgaic cerebral ischemia)  . History of duodenal ulcer    1997  . History of Helicobacter pylori infection    1997  . History of hiatal hernia   . History of loop recorder    Current  . History of syncope    1997  and 2014  s/p  loop recorder  . Hyperlipidemia   . Hypertension   . Nocturia   . PAF (paroxysmal atrial fibrillation) [facility])    cardiologist-  dr [name]  . Renal cell cancer ([place]) 09/06/2016  . Right renal mass   . Status post placement of  implantable loop recorder    07-06-2012  . Stroke [facility])    2013 2 mild strokes mild weakness bil sides  . Urgency of urination   . Wears hearing aid    right ear only    Assessment: 82 y.o. male. had a fall yesterday while walking on uneven ground and landed on his right hip. He presented to the ER and was found to have a femoral neck fx. Patient was on Eliquis PTA for atrial fibrillation, pharmacy consulted to start heparin therapy until surgery on Sunday.  Heparin level 1.12 and aPTT 112 which ar both elevated. Will hold drip for 1 hour and decrease rate. Due to apixaban PTA the APTT and heparin levels may not correlate initially. Will continue to draw both for now.  PM:  PTT came back at 61. A little low this PM. We increase rate. F/u with AM PTT.  Goal of Therapy:  Heparin level 0.3-0.7 units/ml  APTT 66-102 sec Monitor platelets by anticoagulation protocol: Yes   Plan:  Hold heparin for 1 hour Decrease heparin rate  to 850 units/hr Heparin level and APTT in 8 hours   Alanda Slim, PharmD, [facility] Clinical Pharmacist Please see AMION for all Pharmacists' Contact Phone Numbers 01/31/2018, 7:40 PM    Addendum.  Increase heparin to 950 units/hr F/u with AM PTT and HL  Onnie Boer, PharmD, [place], [place], CPP Infectious Disease Pharmacist Pager: [phone] 01/31/2018 7:42 PM

## 2018-01-31 NOTE — Progress Notes (Signed)
ANTICOAGULATION CONSULT NOTE - Initial Consult  Pharmacy Consult for Heparin   Indication: atrial fibrillation  Allergies  Allergen Reactions  . Acyclovir And Related Other (See Comments)    Unsure of what happens    Patient Measurements: Height: 6\' 1"  (185.4 cm) Weight: 192 lb 3.9 oz (87.2 kg) IBW/kg (Calculated) : 79.9  Vital Signs: Temp: 98.2 F (36.8 C) (10/26 0806) Temp Source: Oral (10/26 0806) BP: 143/71 (10/26 0806) Pulse Rate: 68 (10/26 0806)  Labs: Recent Labs    01/29/18 1947 01/30/18 0310 01/30/18 1958 01/31/18 0734  HGB 14.4 13.5  --  12.9*  HCT 43.6 41.4  --  39.2  PLT 132* 132*  --  PLATELET CLUMPS NOTED ON SMEAR, COUNT APPEARS DECREASED  APTT  --   --  138* 112*  LABPROT 15.0  --   --   --   INR 1.19  --   --   --   HEPARINUNFRC  --   --  1.18* 1.12*  CREATININE 2.02* 1.94*  --  1.86*    Estimated Creatinine Clearance: 36.4 mL/min (A) (by C-G formula based on SCr of 1.86 mg/dL (H)).   Medical History: Past Medical History:  Diagnosis Date  . Anxiety   . Bilateral renal cysts   . Cancer ([place])    renal mass  . Depression   . Diverticulosis of colon   . Erectile dysfunction   . Hematuria   . History of cerebral embolic infarction x2 CVA's ---  neurologist-  [name]--- per pt residual abnormal gait , does need cane   12-16-2011  right frontal embolic infarct and [id number] left frontal embolic infarct (per first MRI previous hemorrgaic cerebral ischemia)  . History of duodenal ulcer    1997  . History of Helicobacter pylori infection    1997  . History of hiatal hernia   . History of loop recorder    Current  . History of syncope    1997  and 2014  s/p  loop recorder  . Hyperlipidemia   . Hypertension   . Nocturia   . PAF (paroxysmal atrial fibrillation) [facility])    cardiologist-  dr [name]  . Renal cell cancer ([place]) 09/06/2016  . Right renal mass   . Status post placement of implantable loop recorder    07-06-2012  . Stroke [facility])    2013 2 mild strokes mild weakness bil sides  . Urgency of urination   . Wears hearing aid    right ear only    Assessment: 82 y.o. male. had a fall yesterday while walking on uneven ground and landed on his right hip. He presented to the ER and was found to have a femoral neck fx. Patient was on Eliquis PTA for atrial fibrillation, pharmacy consulted to start heparin therapy until surgery on "Sunday.  Heparin level 1.12 and aPTT 112 which ar both elevated. Will hold drip for 1 hour and decrease rate. Due to apixaban PTA the APTT and heparin levels may not correlate initially. Will continue to draw both for now.   Goal of Therapy:  Heparin level 0.3-0.7 units/ml  APTT 66-102 sec Monitor platelets by anticoagulation protocol: Yes   Plan:  Hold heparin for 1 hour Decrease heparin rate to 850 units/hr Heparin level and APTT in 8 hours   Cathy , PharmD, [facility] Clinical Pharmacist Please see AMION for all Pharmacists' Contact Phone Numbers 01/31/2018, 9:40 AM     10" /26/2019, 9:39 AM

## 2018-01-31 NOTE — Social Work (Signed)
CSW acknowledging consult for SNF placement. Will follow for therapy recommendations.   Fanny Agan H Moris Ratchford, LCSWA [place] Clinical Social Work [phone]   

## 2018-01-31 NOTE — Progress Notes (Signed)
PROGRESS NOTE    Jeffrey Hardy  HGD:[id number] DOB: 1936/01/09 DOA: 01/29/2018 PCP: Jeffrey Borg, MD    Brief Narrative: Jeffrey Hardy is a 82 y.o. male with medical history significant for paroxysmal atrial fibrillation on Eliquis, chronic mild thrombocytopenia, CVA, and chronic kidney disease stage III, now presenting to the emergency department with severe right hip pain and inability to bear weight after a fall.  Patient reports that he was in his usual state of health, having an uneventful day, and was walking out to his barn, started up a knoll, and lost his balance, falling onto his right side.  He experienced immediate and severe pain at the right hip and was unable to bear weight.  Reports that he is fairly active at home, able to ascend a flight of stairs without any significant shortness of breath, and reports that he never experiences chest pain.  No recent fevers, chills, cough, dyspnea, melena, or hematochezia.  [facility] ED Course: Upon arrival to the ED, patient is found to be afebrile, saturating well on room air, and with vitals otherwise normal.  EKG features a sinus rhythm and chest x-ray is negative for acute cardiopulmonary disease.  Radiographs of the hips and pelvis demonstrate acute right subcapital femoral neck fracture.  Chemistry panel features a creatinine of 2.02, similar to priors.  CBC is notable for a slight thrombocytopenia.  Orthopedic surgery was consulted by the ED physician and a medical admission to [facility] was recommended.    Assessment & Plan:   Principal Problem:   Closed right hip fracture, initial encounter [facility]) Active Problems:   Thrombocytopenia (HCC)   Renal cell cancer (HCC)   CKD (chronic kidney disease) stage 3, GFR 30-59 ml/min (HCC)   PAF (paroxysmal atrial fibrillation) ([place])   1-Right hip fracture Was on eliquis prior to admission. Last dose 10-24. Plan for sx on Sunday morning.  For sx  10-27  2-Paroxysmal A fib;  Has prior history of stroke.  eliquis on hold for surgery.  Will bridge with heparin. Plan to stop heparin at 22;00 today   3-CKD stage III;  Cr baseline 2.  Continue to monitor.  Stable.   4-Thrombocytopenia Monitor on heparin.   5-Hiccup;  Started  baclofen PRN.  Improved.   DVT prophylaxis: heparin  Code Status: full code.  Family Communication: no family at bedside.  Disposition Plan: remain in the hospital , awaiting sx, holding eliquis. Started on Heparin Gtt   Consultants:   Ortho    Procedures:   none   Antimicrobials:   none   Subjective: He is feeling ok, denies chest pain. Mild hip pain   Objective: Vitals:   01/30/18 2007 01/31/18 0045 01/31/18 0546 01/31/18 0806  BP: 118/74 116/85 134/84 (!) 143/71  Pulse: 79 70 65 68  Resp:      Temp: 99 F (37.2 C) 97.8 F (36.6 C) 98.6 F (37 C) 98.2 F (36.8 C)  TempSrc: Oral Oral Oral Oral  SpO2: 93% 95% 98% 98%  Weight:      Height:        Intake/Output Summary (Last 24 hours) at 01/31/2018 0836 Last data filed at 01/31/2018 0553 Gross per 24 hour  Intake 664.19 ml  Output 950 ml  Net -285.81 ml   Filed Weights   01/29/18 1823 01/29/18 2351  Weight: 90.7 kg 87.2 kg    Examination:  General exam; NAD Respiratory system: CTA Cardiovascular system: S 1, S 2 RRR  Gastrointestinal system: BS present, soft, nt Central nervous system: alert, non focal.  Extremities: right lower extremity limit mobility  Skin: no rashes.    Data Reviewed: I have personally reviewed following labs and imaging studies  CBC: Recent Labs  Lab 01/29/18 1947 01/30/18 0310  WBC 8.5 8.1  NEUTROABS 7.3  --   HGB 14.4 13.5  HCT 43.6 41.4  MCV 94.6 93.5  PLT 132* 557*   Basic Metabolic Panel: Recent Labs  Lab 01/29/18 1947 01/30/18 0310  NA 139 140  K 3.8 4.2  CL 103 108  CO2 25 24  GLUCOSE 98 119*  BUN 33* 29*  CREATININE 2.02* 1.94*  CALCIUM 9.3 8.9    GFR: Estimated Creatinine Clearance: 34.9 mL/min (A) (by C-G formula based on SCr of 1.94 mg/dL (H)). Liver Function Tests: No results for input(s): AST, ALT, ALKPHOS, BILITOT, PROT, ALBUMIN in the last 168 hours. No results for input(s): LIPASE, AMYLASE in the last 168 hours. No results for input(s): AMMONIA in the last 168 hours. Coagulation Profile: Recent Labs  Lab 01/29/18 1947  INR 1.19   Cardiac Enzymes: No results for input(s): CKTOTAL, CKMB, CKMBINDEX, TROPONINI in the last 168 hours. BNP (last 3 results) No results for input(s): PROBNP in the last 8760 hours. HbA1C: No results for input(s): HGBA1C in the last 72 hours. CBG: No results for input(s): GLUCAP in the last 168 hours. Lipid Profile: No results for input(s): CHOL, HDL, LDLCALC, TRIG, CHOLHDL, LDLDIRECT in the last 72 hours. Thyroid Function Tests: No results for input(s): TSH, T4TOTAL, FREET4, T3FREE, THYROIDAB in the last 72 hours. Anemia Panel: No results for input(s): VITAMINB12, FOLATE, FERRITIN, TIBC, IRON, RETICCTPCT in the last 72 hours. Sepsis Labs: No results for input(s): PROCALCITON, LATICACIDVEN in the last 168 hours.  Recent Results (from the past 240 hour(s))  Surgical PCR screen     Status: None   Collection Time: 01/30/18  2:17 AM  Result Value Ref Range Status   MRSA, PCR NEGATIVE NEGATIVE Final   Staphylococcus aureus NEGATIVE NEGATIVE Final    Comment: (NOTE) The Xpert SA Assay (FDA approved for NASAL specimens in patients 84 years of age and older), is one component of a comprehensive surveillance program. It is not intended to diagnose infection nor to guide or monitor treatment. Performed at [facility], [place] [address], [place], [place] [address]          Radiology Studies: Dg Chest Port 1 View  Result Date: 01/29/2018 CLINICAL DATA:  82 y/o  M; left hip fracture today. Former smoker. EXAM: PORTABLE CHEST 1 VIEW COMPARISON:  10/23/2017 chest radiograph. FINDINGS:  The heart size and mediastinal contours are within normal limits. Loop recorder device noted. Both lungs are clear. The visualized skeletal structures are unremarkable. IMPRESSION: No active disease. Electronically Signed   By: Kristine Garbe M.D.   On: 01/29/2018 20:04   Dg Hip Unilat W Or Wo Pelvis 2-3 Views Right  Result Date: 01/29/2018 CLINICAL DATA:  Fall with hip pain EXAM: DG HIP (WITH OR WITHOUT PELVIS) 2-3V RIGHT COMPARISON:  08/14/2017 FINDINGS: Status post left hip replacement with normal alignment and intact hardware. Pubic symphysis is intact. Rami are without acute displaced fracture. Acute mildly displaced right subcapital femoral neck fracture. No femoral head dislocation. IMPRESSION: 1. Acute right subcapital femoral neck fracture 2. Prior left hip replacement with normal alignment. Electronically Signed   By: Donavan Foil M.D.   On: 01/29/2018 19:19  Scheduled Meds: . cholecalciferol  2,000 Units Oral QHS  . feeding supplement (ENSURE ENLIVE)  237 mL Oral BID BM  . senna-docusate  1 tablet Oral BID  . tamsulosin  0.4 mg Oral QPC breakfast   Continuous Infusions: . heparin 1,050 Units/hr (01/31/18 0800)     LOS: 2 days    Time spent: 35 minutes,     Elmarie Shiley, MD [facility] Pager [phone]  If 7PM-7AM, please contact night-coverage [email or web address] Password [facility] 01/31/2018, 8:36 AM

## 2018-01-31 NOTE — Progress Notes (Addendum)
Subjective: Pt resting comfortably.  No c/o.  Eating breakfast.  On heparin for a fib.   Objective: Vital signs in last 24 hours: Temp:  [97.8 F (36.6 C)-99.3 F (37.4 C)] 98.6 F (37 C) (10/26 0546) Pulse Rate:  [65-79] 65 (10/26 0546) Resp:  [16] 16 (10/25 1626) BP: (116-134)/(66-85) 134/84 (10/26 0546) SpO2:  [93 %-100 %] 98 % (10/26 0546)  Intake/Output from previous day: 10/25 0701 - 10/26 0700 In: 664.2 [P.O.:600; I.V.:64.2] Out: 950 [Urine:950] Intake/Output this shift: No intake/output data recorded.  Recent Labs    01/29/18 1947 01/30/18 0310  HGB 14.4 13.5   Recent Labs    01/29/18 1947 01/30/18 0310  WBC 8.5 8.1  RBC 4.61 4.43  HCT 43.6 41.4  PLT 132* 132*   Recent Labs    01/29/18 1947 01/30/18 0310  NA 139 140  K 3.8 4.2  CL 103 108  CO2 25 24  BUN 33* 29*  CREATININE 2.02* 1.94*  GLUCOSE 98 119*  CALCIUM 9.3 8.9   Recent Labs    01/29/18 1947  INR 1.19    wn wd male in nad.  A and O.  R LE NVI.      Assessment/Plan: OR tomorrow for R THA by Dr. [name].  Delay necessary due to [address].  NPO after midnight.  Heparin scheduled to end at 22:00.   Wylene Simmer 01/31/2018, 8:04 AM

## 2018-02-01 ENCOUNTER — Inpatient Hospital Stay (HOSPITAL_COMMUNITY): Payer: PPO | Admitting: Anesthesiology

## 2018-02-01 ENCOUNTER — Inpatient Hospital Stay (HOSPITAL_COMMUNITY): Payer: PPO

## 2018-02-01 ENCOUNTER — Encounter (HOSPITAL_COMMUNITY): Payer: Self-pay | Admitting: Anesthesiology

## 2018-02-01 ENCOUNTER — Encounter (HOSPITAL_COMMUNITY)
Admission: EM | Disposition: A | Discharge status: Home Health | Payer: Self-pay | Source: Home / Self Care | Attending: Internal Medicine

## 2018-02-01 DIAGNOSIS — S72001A Fracture of unspecified part of neck of right femur, initial encounter for closed fracture: Secondary | ICD-10-CM | POA: Diagnosis present

## 2018-02-01 HISTORY — PX: TOTAL HIP ARTHROPLASTY: SHX124

## 2018-02-01 LAB — CBC
HCT: 40.3 % (ref 39.0–52.0)
Hemoglobin: 13.1 g/dL (ref 13.0–17.0)
MCH: 30.2 pg (ref 26.0–34.0)
MCHC: 32.5 g/dL (ref 30.0–36.0)
MCV: 92.9 fL (ref 80.0–100.0)
Platelets: 95 10*3/uL — ABNORMAL LOW (ref 150–400)
RBC: 4.34 MIL/uL (ref 4.22–5.81)
RDW: 13.4 % (ref 11.5–15.5)
WBC: 6.7 10*3/uL (ref 4.0–10.5)
nRBC: 0 % (ref 0.0–0.2)

## 2018-02-01 LAB — APTT: aPTT: 38 seconds — ABNORMAL HIGH (ref 24–36)

## 2018-02-01 LAB — HEPARIN LEVEL (UNFRACTIONATED): Heparin Unfractionated: 0.56 IU/mL (ref 0.30–0.70)

## 2018-02-01 SURGERY — ARTHROPLASTY, HIP, TOTAL, ANTERIOR APPROACH
Anesthesia: General | Site: Hip | Laterality: Right

## 2018-02-01 MED ORDER — MIDAZOLAM HCL 2 MG/2ML IJ SOLN
INTRAMUSCULAR | Status: AC
  Filled 2018-02-01: qty 2

## 2018-02-01 MED ORDER — BUPIVACAINE-EPINEPHRINE 0.5% -1:200000 IJ SOLN
INTRAMUSCULAR | Status: AC
  Filled 2018-02-01: qty 1

## 2018-02-01 MED ORDER — LIDOCAINE 2% (20 MG/ML) 5 ML SYRINGE
INTRAMUSCULAR | Status: AC
  Filled 2018-02-01: qty 5

## 2018-02-01 MED ORDER — FENTANYL CITRATE (PF) 250 MCG/5ML IJ SOLN
INTRAMUSCULAR | Status: AC
  Filled 2018-02-01: qty 5

## 2018-02-01 MED ORDER — SODIUM CHLORIDE 0.9 % IJ SOLN
INTRAMUSCULAR | Status: DC | PRN
  Administered 2018-02-01 (×3): 10 mL

## 2018-02-01 MED ORDER — SENNA 8.6 MG PO TABS: 1.0000 | ORAL_TABLET | Freq: Two times a day (BID) | ORAL | Status: DC

## 2018-02-01 MED ORDER — HYDROCODONE-ACETAMINOPHEN 5-325 MG PO TABS
1.0000 | ORAL_TABLET | ORAL | Status: DC | PRN
  Administered 2018-02-02: 1 via ORAL
  Filled 2018-02-01: qty 1

## 2018-02-01 MED ORDER — ROCURONIUM BROMIDE 50 MG/5ML IV SOSY
PREFILLED_SYRINGE | INTRAVENOUS | Status: AC
  Filled 2018-02-01: qty 5

## 2018-02-01 MED ORDER — SODIUM CHLORIDE 0.9 % IV SOLN
INTRAVENOUS | Status: AC | PRN
  Administered 2018-02-01: 500 mL

## 2018-02-01 MED ORDER — PROPOFOL 10 MG/ML IV BOLUS
INTRAVENOUS | Status: DC | PRN
  Administered 2018-02-01: 110 mg via INTRAVENOUS

## 2018-02-01 MED ORDER — PHENYLEPHRINE 40 MCG/ML (10ML) SYRINGE FOR IV PUSH (FOR BLOOD PRESSURE SUPPORT)
PREFILLED_SYRINGE | INTRAVENOUS | Status: AC
  Filled 2018-02-01: qty 10

## 2018-02-01 MED ORDER — METOCLOPRAMIDE HCL 5 MG PO TABS: 5.0000 mg | ORAL_TABLET | Freq: Three times a day (TID) | ORAL | Status: DC | PRN

## 2018-02-01 MED ORDER — TRANEXAMIC ACID-NACL 1000-0.7 MG/100ML-% IV SOLN
INTRAVENOUS | Status: AC
  Filled 2018-02-01: qty 100

## 2018-02-01 MED ORDER — SODIUM CHLORIDE 0.9 % IV SOLN
INTRAVENOUS | Status: DC | PRN
  Administered 2018-02-01: 20 ug/min via INTRAVENOUS

## 2018-02-01 MED ORDER — OXYCODONE HCL 5 MG/5ML PO SOLN: 5.0000 mg | Freq: Once | ORAL | Status: DC | PRN

## 2018-02-01 MED ORDER — PHENOL 1.4 % MT LIQD: 1.0000 | OROMUCOSAL | Status: DC | PRN

## 2018-02-01 MED ORDER — SODIUM CHLORIDE 0.9 % IV SOLN
INTRAVENOUS | Status: DC
  Administered 2018-02-01: 17:00:00 via INTRAVENOUS

## 2018-02-01 MED ORDER — DOCUSATE SODIUM 100 MG PO CAPS
100.0000 mg | ORAL_CAPSULE | Freq: Two times a day (BID) | ORAL | Status: DC
  Administered 2018-02-01 – 2018-02-03 (×5): 100 mg via ORAL
  Filled 2018-02-01 (×5): qty 1

## 2018-02-01 MED ORDER — PHENYLEPHRINE 40 MCG/ML (10ML) SYRINGE FOR IV PUSH (FOR BLOOD PRESSURE SUPPORT)
PREFILLED_SYRINGE | INTRAVENOUS | Status: DC | PRN
  Administered 2018-02-01 (×6): 80 ug via INTRAVENOUS

## 2018-02-01 MED ORDER — OXYCODONE HCL 5 MG PO TABS: 5.0000 mg | ORAL_TABLET | Freq: Once | ORAL | Status: DC | PRN

## 2018-02-01 MED ORDER — FENTANYL CITRATE (PF) 100 MCG/2ML IJ SOLN
25.0000 ug | INTRAMUSCULAR | Status: DC | PRN
  Administered 2018-02-01 (×2): 25 ug via INTRAVENOUS

## 2018-02-01 MED ORDER — FENTANYL CITRATE (PF) 100 MCG/2ML IJ SOLN
INTRAMUSCULAR | Status: DC | PRN
  Administered 2018-02-01: 100 ug via INTRAVENOUS
  Administered 2018-02-01: 25 ug via INTRAVENOUS
  Administered 2018-02-01: 50 ug via INTRAVENOUS
  Administered 2018-02-01: 100 ug via INTRAVENOUS
  Administered 2018-02-01: 25 ug via INTRAVENOUS
  Administered 2018-02-01 (×2): 50 ug via INTRAVENOUS

## 2018-02-01 MED ORDER — SODIUM CHLORIDE 0.9 % IR SOLN
Status: DC | PRN
  Administered 2018-02-01: 3000 mL

## 2018-02-01 MED ORDER — LACTATED RINGERS IV SOLN
INTRAVENOUS | Status: DC
  Administered 2018-02-01 (×3): via INTRAVENOUS

## 2018-02-01 MED ORDER — MORPHINE SULFATE (PF) 2 MG/ML IV SOLN
0.5000 mg | INTRAVENOUS | Status: DC | PRN
  Administered 2018-02-01: 1 mg via INTRAVENOUS
  Filled 2018-02-01: qty 1

## 2018-02-01 MED ORDER — CEFAZOLIN SODIUM-DEXTROSE 2-4 GM/100ML-% IV SOLN
2.0000 g | Freq: Four times a day (QID) | INTRAVENOUS | Status: AC
  Administered 2018-02-01 (×2): 2 g via INTRAVENOUS
  Filled 2018-02-01 (×2): qty 100

## 2018-02-01 MED ORDER — ACETAMINOPHEN 160 MG/5ML PO SOLN: 1000.0000 mg | Freq: Once | ORAL | Status: DC | PRN

## 2018-02-01 MED ORDER — ONDANSETRON HCL 4 MG/2ML IJ SOLN
INTRAMUSCULAR | Status: DC | PRN
  Administered 2018-02-01: 4 mg via INTRAVENOUS

## 2018-02-01 MED ORDER — HYDROCODONE-ACETAMINOPHEN 7.5-325 MG PO TABS: 1.0000 | ORAL_TABLET | ORAL | Status: DC | PRN

## 2018-02-01 MED ORDER — CHLORHEXIDINE GLUCONATE 4 % EX LIQD: 60.0000 mL | Freq: Once | CUTANEOUS | Status: DC

## 2018-02-01 MED ORDER — DEXAMETHASONE SODIUM PHOSPHATE 10 MG/ML IJ SOLN
INTRAMUSCULAR | Status: AC
  Filled 2018-02-01: qty 1

## 2018-02-01 MED ORDER — SUGAMMADEX SODIUM 200 MG/2ML IV SOLN
INTRAVENOUS | Status: DC | PRN
  Administered 2018-02-01: 175 mg via INTRAVENOUS

## 2018-02-01 MED ORDER — METOCLOPRAMIDE HCL 5 MG/ML IJ SOLN: 5.0000 mg | Freq: Three times a day (TID) | INTRAMUSCULAR | Status: DC | PRN

## 2018-02-01 MED ORDER — APIXABAN 2.5 MG PO TABS
2.5000 mg | ORAL_TABLET | Freq: Two times a day (BID) | ORAL | Status: DC
  Administered 2018-02-02 – 2018-02-04 (×5): 2.5 mg via ORAL
  Filled 2018-02-01 (×5): qty 1

## 2018-02-01 MED ORDER — MENTHOL 3 MG MT LOZG: 1.0000 | LOZENGE | OROMUCOSAL | Status: DC | PRN

## 2018-02-01 MED ORDER — TRANEXAMIC ACID-NACL 1000-0.7 MG/100ML-% IV SOLN
1000.0000 mg | INTRAVENOUS | Status: AC
  Administered 2018-02-01: 1000 mg via INTRAVENOUS

## 2018-02-01 MED ORDER — BUPIVACAINE-EPINEPHRINE (PF) 0.5% -1:200000 IJ SOLN
INTRAMUSCULAR | Status: DC | PRN
  Administered 2018-02-01: 30 mL

## 2018-02-01 MED ORDER — 0.9 % SODIUM CHLORIDE (POUR BTL) OPTIME
TOPICAL | Status: DC | PRN
  Administered 2018-02-01: 1000 mL

## 2018-02-01 MED ORDER — ACETAMINOPHEN 10 MG/ML IV SOLN
1000.0000 mg | INTRAVENOUS | Status: AC
  Administered 2018-02-01: 1000 mg via INTRAVENOUS
  Filled 2018-02-01: qty 100

## 2018-02-01 MED ORDER — ACETAMINOPHEN 10 MG/ML IV SOLN: 1000.0000 mg | Freq: Once | INTRAVENOUS | Status: DC | PRN

## 2018-02-01 MED ORDER — ONDANSETRON HCL 4 MG/2ML IJ SOLN
INTRAMUSCULAR | Status: AC
  Filled 2018-02-01: qty 2

## 2018-02-01 MED ORDER — CEFAZOLIN SODIUM-DEXTROSE 2-4 GM/100ML-% IV SOLN
2.0000 g | INTRAVENOUS | Status: AC
  Administered 2018-02-01: 2 g via INTRAVENOUS
  Filled 2018-02-01 (×2): qty 100

## 2018-02-01 MED ORDER — ACETAMINOPHEN 325 MG PO TABS: 325.0000 mg | ORAL_TABLET | Freq: Four times a day (QID) | ORAL | Status: DC | PRN

## 2018-02-01 MED ORDER — ALBUMIN HUMAN 5 % IV SOLN
INTRAVENOUS | Status: DC | PRN
  Administered 2018-02-01 (×2): via INTRAVENOUS

## 2018-02-01 MED ORDER — LIDOCAINE 2% (20 MG/ML) 5 ML SYRINGE
INTRAMUSCULAR | Status: DC | PRN
  Administered 2018-02-01: 60 mg via INTRAVENOUS

## 2018-02-01 MED ORDER — DEXAMETHASONE SODIUM PHOSPHATE 10 MG/ML IJ SOLN
INTRAMUSCULAR | Status: DC | PRN
  Administered 2018-02-01: 10 mg via INTRAVENOUS

## 2018-02-01 MED ORDER — POVIDONE-IODINE 10 % EX SWAB: 2.0000 "application " | Freq: Once | CUTANEOUS | Status: DC

## 2018-02-01 MED ORDER — ROCURONIUM BROMIDE 10 MG/ML (PF) SYRINGE
PREFILLED_SYRINGE | INTRAVENOUS | Status: DC | PRN
  Administered 2018-02-01: 50 mg via INTRAVENOUS
  Administered 2018-02-01: 10 mg via INTRAVENOUS

## 2018-02-01 MED ORDER — PROPOFOL 10 MG/ML IV BOLUS
INTRAVENOUS | Status: AC
  Filled 2018-02-01: qty 20

## 2018-02-01 MED ORDER — KETOROLAC TROMETHAMINE 30 MG/ML IJ SOLN
INTRAMUSCULAR | Status: DC | PRN
  Administered 2018-02-01: 30 mg via INTRA_ARTICULAR

## 2018-02-01 MED ORDER — KETOROLAC TROMETHAMINE 30 MG/ML IJ SOLN
INTRAMUSCULAR | Status: AC
  Filled 2018-02-01: qty 1

## 2018-02-01 MED ORDER — ACETAMINOPHEN 500 MG PO TABS: 1000.0000 mg | ORAL_TABLET | Freq: Once | ORAL | Status: DC | PRN

## 2018-02-01 MED ORDER — FENTANYL CITRATE (PF) 100 MCG/2ML IJ SOLN
INTRAMUSCULAR | Status: AC
  Administered 2018-02-01: 25 ug via INTRAVENOUS
  Filled 2018-02-01: qty 2

## 2018-02-01 SURGICAL SUPPLY — 67 items
ADH SKN CLS APL DERMABOND .7 (GAUZE/BANDAGES/DRESSINGS) ×2
ADH SKN CLS LQ APL DERMABOND (GAUZE/BANDAGES/DRESSINGS) ×2
ALCOHOL ISOPROPYL (RUBBING) (MISCELLANEOUS) ×3 IMPLANT
BALL HIP ARTICU EZE 36 8.5 (Hips) IMPLANT
BLADE CLIPPER SURG (BLADE) IMPLANT
CHLORAPREP W/TINT 26ML (MISCELLANEOUS) ×3 IMPLANT
COVER SURGICAL LIGHT HANDLE (MISCELLANEOUS) ×3 IMPLANT
COVER WAND RF STERILE (DRAPES) ×3 IMPLANT
CUP ACET PINNACLE SECTR 60MM (Hips) IMPLANT
DERMABOND ADHESIVE PROPEN (GAUZE/BANDAGES/DRESSINGS) ×4
DERMABOND ADVANCED (GAUZE/BANDAGES/DRESSINGS) ×4
DERMABOND ADVANCED .7 DNX12 (GAUZE/BANDAGES/DRESSINGS) ×2 IMPLANT
DERMABOND ADVANCED .7 DNX6 (GAUZE/BANDAGES/DRESSINGS) IMPLANT
DRAPE C-ARM 42X72 X-RAY (DRAPES) ×3 IMPLANT
DRAPE STERI IOBAN 125X83 (DRAPES) ×3 IMPLANT
DRAPE U-SHAPE 47X51 STRL (DRAPES) ×9 IMPLANT
DRSG AQUACEL AG ADV 3.5X10 (GAUZE/BANDAGES/DRESSINGS) ×3 IMPLANT
ELECT BLADE 4.0 EZ CLEAN MEGAD (MISCELLANEOUS) ×3
ELECT PENCIL ROCKER SW 15FT (MISCELLANEOUS) ×3 IMPLANT
ELECT REM PT RETURN 9FT ADLT (ELECTROSURGICAL) ×3
ELECTRODE BLDE 4.0 EZ CLN MEGD (MISCELLANEOUS) ×1 IMPLANT
ELECTRODE REM PT RTRN 9FT ADLT (ELECTROSURGICAL) ×1 IMPLANT
EVACUATOR 1/8 PVC DRAIN (DRAIN) IMPLANT
GLOVE BIO SURGEON STRL SZ8.5 (GLOVE) ×6 IMPLANT
GLOVE BIOGEL PI IND STRL 8.5 (GLOVE) ×1 IMPLANT
GLOVE BIOGEL PI INDICATOR 8.5 (GLOVE) ×2
GOWN STRL REUS W/ TWL LRG LVL3 (GOWN DISPOSABLE) ×2 IMPLANT
GOWN STRL REUS W/TWL 2XL LVL3 (GOWN DISPOSABLE) ×3 IMPLANT
GOWN STRL REUS W/TWL LRG LVL3 (GOWN DISPOSABLE) ×6
HANDPIECE INTERPULSE COAX TIP (DISPOSABLE) ×3
HIP BALL ARTICU EZE 36 8.5 (Hips) ×3 IMPLANT
HOOD PEEL AWAY FACE SHEILD DIS (HOOD) ×2 IMPLANT
HOOD PEEL AWAY FLYTE STAYCOOL (MISCELLANEOUS) ×4 IMPLANT
KIT BASIN OR (CUSTOM PROCEDURE TRAY) ×3 IMPLANT
KIT TURNOVER KIT B (KITS) ×3 IMPLANT
LINER NEUTRAL 58X36MM PLUS4 ×2 IMPLANT
MANIFOLD NEPTUNE II (INSTRUMENTS) ×3 IMPLANT
MARKER SKIN DUAL TIP RULER LAB (MISCELLANEOUS) ×6 IMPLANT
NDL SPNL 18GX3.5 QUINCKE PK (NEEDLE) ×1 IMPLANT
NEEDLE SPNL 18GX3.5 QUINCKE PK (NEEDLE) ×3 IMPLANT
NS IRRIG 1000ML POUR BTL (IV SOLUTION) ×3 IMPLANT
PACK TOTAL JOINT (CUSTOM PROCEDURE TRAY) ×3 IMPLANT
PACK UNIVERSAL I (CUSTOM PROCEDURE TRAY) ×3 IMPLANT
PAD ARMBOARD 7.5X6 YLW CONV (MISCELLANEOUS) ×6 IMPLANT
PINNSECTOR W/GRIP ACE CUP 60MM (Hips) ×3 IMPLANT
SAW OSC TIP CART 19.5X105X1.3 (SAW) ×3 IMPLANT
SEALER BIPOLAR AQUA 6.0 (INSTRUMENTS) ×2 IMPLANT
SET HNDPC FAN SPRY TIP SCT (DISPOSABLE) ×1 IMPLANT
SLEEVE CABLE 2MM VT (Orthopedic Implant) ×2 IMPLANT
SOL PREP POV-IOD 4OZ 10% (MISCELLANEOUS) ×1 IMPLANT
STEM AML 18.0 STD 6.3 5/8 STD (Hips) ×2 IMPLANT
SUT ETHIBOND NAB CT1 #1 30IN (SUTURE) ×6 IMPLANT
SUT MNCRL AB 3-0 PS2 18 (SUTURE) ×3 IMPLANT
SUT MON AB 2-0 CT1 36 (SUTURE) ×3 IMPLANT
SUT VIC AB 1 CT1 27 (SUTURE) ×3
SUT VIC AB 1 CT1 27XBRD ANBCTR (SUTURE) ×1 IMPLANT
SUT VIC AB 2-0 CT1 27 (SUTURE) ×3
SUT VIC AB 2-0 CT1 TAPERPNT 27 (SUTURE) ×1 IMPLANT
SUT VLOC 180 0 24IN GS25 (SUTURE) ×3 IMPLANT
SYR 50ML LL SCALE MARK (SYRINGE) ×3 IMPLANT
TOWEL OR 17X24 6PK STRL BLUE (TOWEL DISPOSABLE) ×3 IMPLANT
TOWEL OR 17X26 10 PK STRL BLUE (TOWEL DISPOSABLE) ×3 IMPLANT
TRAY CATH 16FR W/PLASTIC CATH (SET/KITS/TRAYS/PACK) IMPLANT
TRAY FOLEY CATH SILVER 16FR (SET/KITS/TRAYS/PACK) IMPLANT
TRAY FOLEY W/BAG SLVR 14FR (SET/KITS/TRAYS/PACK) ×2 IMPLANT
WATER STERILE IRR 1000ML POUR (IV SOLUTION) ×3 IMPLANT
YANKAUER SUCT BULB TIP NO VENT (SUCTIONS) ×2 IMPLANT

## 2018-02-01 NOTE — Anesthesia Preprocedure Evaluation (Addendum)
Anesthesia Evaluation  Patient identified by MRN, date of birth, ID band Patient awake    Reviewed: Allergy & Precautions, H&P , NPO status , Patient's Chart, lab work & pertinent test results  History of Anesthesia Complications Negative for: history of anesthetic complications  Airway Mallampati: II  TM Distance: >3 FB Neck ROM: Full    Dental  (+) Dental Advisory Given, Chipped,    Pulmonary former smoker,    breath sounds clear to auscultation       Cardiovascular hypertension, + Peripheral Vascular Disease  + dysrhythmias Atrial Fibrillation  Rhythm:regular Rate:Normal     Neuro/Psych PSYCHIATRIC DISORDERS Anxiety Depression CVA, No Residual Symptoms    GI/Hepatic hiatal hernia, GERD  ,  Endo/Other    Renal/GU Renal InsufficiencyRenal disease     Musculoskeletal  (+) Arthritis , Right femoral neck fx   Abdominal   Peds  Hematology   Anesthesia Other Findings   Reproductive/Obstetrics                          Anesthesia Physical Anesthesia Plan  ASA: III  Anesthesia Plan: General   Post-op Pain Management:    Induction: Intravenous  PONV Risk Score and Plan: 2 and Ondansetron and Dexamethasone  Airway Management Planned: Oral ETT  Additional Equipment: None  Intra-op Plan:   Post-operative Plan: Extubation in OR  Informed Consent: I have reviewed the patients History and Physical, chart, labs and discussed the procedure including the risks, benefits and alternatives for the proposed anesthesia with the patient or authorized representative who has indicated his/her understanding and acceptance.   Dental advisory given  Plan Discussed with: CRNA and Surgeon  Anesthesia Plan Comments:         Anesthesia Quick Evaluation

## 2018-02-01 NOTE — Op Note (Signed)
OPERATIVE REPORT  SURGEON: Rod Can, MD   ASSISTANT: Ardeen Jourdain, PA-C.  PREOPERATIVE DIAGNOSIS: Displaced Right femoral neck fracture.   POSTOPERATIVE DIAGNOSIS:  Displaced Right femoral neck fracture.   PROCEDURE: Right total hip arthroplasty, anterior approach.   IMPLANTS: DePuy AML stem, size 18 mm, large stature. DePuy Pinnacle Cup, size 60 mm. DePuy Altrx liner, size 36 by 60 mm, +4 neutral. DePuy metal head ball, size 36 + 8.5 mm. Dall-Miles 2.0 mm adult reconstruction cable.  ANESTHESIA:  General  ESTIMATED BLOOD LOSS: 750 mL.  ANTIBIOTICS: 2 g Ancef.  DRAINS: None.  COMPLICATIONS: None.   CONDITION: PACU - hemodynamically stable.   BRIEF CLINICAL NOTE: Jeffrey Hardy is a 82 y.o. male with a displaced right femoral neck fracture.  Patient is known to me for previous left total hip arthroplasty following femoral neck fracture.  I actually saw him in the office about a week ago.  He was found to have a new onset foot drop.  I ordered him an AFO brace, an MRI of the lumbar spine, and the patient will be following up with Dr. Rolena Infante.  He subsequently fell and fractured his right hip.  He had right hip pain and inability to weight-bear.  He was admitted to the hospitalist service for perioperative risk stratification and medical optimization.  Due to Eliquis usage, we delayed his surgery by 72 hours.  The patient was indicated for total hip arthroplasty. The risks, benefits, and alternatives to the procedure were explained, and the patient elected to proceed.  PROCEDURE IN DETAIL: Surgical site was marked by myself in the pre-op holding area. Once inside the operating room, spinal anesthesia was obtained, and a foley catheter was inserted. The patient was then positioned on the Hana table. All bony prominences were well padded. The hip was prepped and draped in the normal sterile surgical fashion. A time-out was called verifying side and site of surgery. The  patient received IV antibiotics within 60 minutes of beginning the procedure.  The direct anterior approach to the hip was performed through the Hueter interval. Lateral femoral circumflex vessels were treated with the Auqumantys. The anterior capsule was exposed and an inverted T capsulotomy was made. The fracture hematoma was encountered and evacuated.  He was found to have a comminuted subcapital femoral neck fracture with a V-shaped defect in the calcar.  The femoral neck cut was made to the level of the templated cut. A corkscrew was placed into the head and the head was removed. The head was passed to the back table and was measured.  I placed a single prophylactic adult reconstruction cable subperiosteally just proximal to the lesser trochanter.  Acetabular exposure was achieved, and the pulvinar and labrum were excised. Sequential reaming of the acetabulum was then performed up to a size 59 mm reamer. A 60 mm cup was then opened and impacted into place at approximately 40 degrees of abduction and 20 degrees of anteversion. The final polyethylene liner was impacted into place and acetabular osteophytes were removed.   I then gained femoral exposure taking care to protect the abductors and greater trochanter. This was performed using standard external rotation, extension, and adduction. The capsule was peeled off the inner aspect of the greater trochanter, taking care to preserve the short external rotators.  I began preparing for a Tri-Lock stem.  A cookie cutter was used to enter the femoral canal, and then the femoral canal finder was placed. Sequential broaching was performed up to a size  10. Calcar planer was used on the femoral neck remnant. I placed a standard offset neck and a trial head ball. The hip was reduced. Leg lengths and offset were checked fluoroscopically. The hip was dislocated and trial components were removed. The broach did not obtain appropriate rotational  stability, so therefore I elected to proceed with an AML stem.  I reamed sequentially up to a 17.5 mm reamer.  I then sequentially broached up to a 18 mm large stature broach.  I placed the trial neck and head ball.  The hip was reduced.  Stem fit was assessed fluoroscopically as well as leg lengths.  The hip was then dislocated and trial components were removed.  The final implants were placed, and the hip was reduced.  Fluoroscopy was used to confirm component position and leg lengths. At 90 degrees of external rotation and full extension, the hip was stable to an anterior directed force.  The wound was copiously irrigated with normal saline using pulse lavage. Marcaine solution was injected into the periarticular soft tissue. The wound was closed in layers using #1 Vicryl and V-Loc for the fascia, 2-0 Vicryl for the subcutaneous fat, 2-0 Monocryl for the deep dermal layer, 3-0 running Monocryl subcuticular stitch, and Dermabond for the skin. Once the glue was fully dried, an Aquacell Ag dressing was applied. The patient was transported to the recovery room in stable condition. Sponge, needle, and instrument counts were correct at the end of the case x2. The patient tolerated the procedure well and there were no known complications.  Please note that a surgical assistant was a medical necessity for this procedure to perform it in a safe and expeditious manner. Assistant was necessary to provide appropriate retraction of vital neurovascular structures, to prevent femoral fracture, and to allow for anatomic placement of the prosthesis.  Postoperatively, the patient will be readmitted to the hospitalist service.  He may weight-bear as tolerated with a walker.  Beginning tomorrow, we will resume his Eliquis at 2.5 mg p.o. twice daily.  After 48 to 72 hours, the normal home dose can be resumed.  He will work with physical therapy and undergo disposition planning.  I will need to see him in the office  for routine two-week postoperative care.

## 2018-02-01 NOTE — Anesthesia Procedure Notes (Signed)
Procedure Name: Intubation Date/Time: 02/01/2018 10:43 AM Performed by: Jenne Campus, CRNA Pre-anesthesia Checklist: Patient identified, Emergency Drugs available, Suction available and Patient being monitored Patient Re-evaluated:Patient Re-evaluated prior to induction Oxygen Delivery Method: Circle System Utilized Preoxygenation: Pre-oxygenation with 100% oxygen Induction Type: IV induction Ventilation: Mask ventilation without difficulty Laryngoscope Size: Miller and 3 Grade View: Grade I Tube type: Oral Tube size: 7.5 mm Number of attempts: 1 Airway Equipment and Method: Stylet and Oral airway Placement Confirmation: ETT inserted through vocal cords under direct vision,  positive ETCO2 and breath sounds checked- equal and bilateral Secured at: 22 cm Tube secured with: Tape Dental Injury: Teeth and Oropharynx as per pre-operative assessment

## 2018-02-01 NOTE — Progress Notes (Signed)
Heparin infusion stopped at 2202 by order, will continue to monitor patient

## 2018-02-01 NOTE — Anesthesia Postprocedure Evaluation (Signed)
Anesthesia Post Note  Patient: Jeffrey Hardy  Procedure(s) Performed: TOTAL HIP ARTHROPLASTY ANTERIOR APPROACH (Right Hip)     Patient location during evaluation: PACU Anesthesia Type: General Level of consciousness: awake and alert Pain management: pain level controlled Vital Signs Assessment: post-procedure vital signs reviewed and stable Respiratory status: spontaneous breathing, nonlabored ventilation, respiratory function stable and patient connected to nasal cannula oxygen Cardiovascular status: blood pressure returned to baseline and stable Postop Assessment: no apparent nausea or vomiting Anesthetic complications: no    Last Vitals:  Vitals:   02/01/18 1515 02/01/18 1536  BP:  113/78  Pulse: 85 83  Resp: 20 18  Temp:  36.7 C  SpO2: 99% 100%    Last Pain:  Vitals:   02/01/18 1536  TempSrc: Oral  PainSc: Asleep                 Jereld Presti

## 2018-02-01 NOTE — Progress Notes (Signed)
PROGRESS NOTE    Jeffrey Hardy  RWE:[id number] DOB: Nov 11, 1935 DOA: 01/29/2018 PCP: Biagio Borg, MD    Brief Narrative: Jeffrey Hardy is a 82 y.o. male with medical history significant for paroxysmal atrial fibrillation on Eliquis, chronic mild thrombocytopenia, CVA, and chronic kidney disease stage III, now presenting to the emergency department with severe right hip pain and inability to bear weight after a fall.  Patient reports that he was in his usual state of health, having an uneventful day, and was walking out to his barn, started up a knoll, and lost his balance, falling onto his right side.  He experienced immediate and severe pain at the right hip and was unable to bear weight.  Reports that he is fairly active at home, able to ascend a flight of stairs without any significant shortness of breath, and reports that he never experiences chest pain.  No recent fevers, chills, cough, dyspnea, melena, or hematochezia.  [facility] ED Course: Upon arrival to the ED, patient is found to be afebrile, saturating well on room air, and with vitals otherwise normal.  EKG features a sinus rhythm and chest x-ray is negative for acute cardiopulmonary disease.  Radiographs of the hips and pelvis demonstrate acute right subcapital femoral neck fracture.  Chemistry panel features a creatinine of 2.02, similar to priors.  CBC is notable for a slight thrombocytopenia.  Orthopedic surgery was consulted by the ED physician and a medical admission to [facility] was recommended.    Assessment & Plan:   Principal Problem:   Closed right hip fracture, initial encounter [facility]) Active Problems:   Thrombocytopenia (HCC)   Renal cell cancer (HCC)   CKD (chronic kidney disease) stage 3, GFR 30-59 ml/min (HCC)   PAF (paroxysmal atrial fibrillation) ([place])   1-Right hip fracture, displaced.  Was on eliquis prior to admission. Last dose 10-24. Plan for sx on Sunday morning.  For sx 10-27  right total hip arthroplasty.  Monitor hb post surgery   2-Paroxysmal A fib;  Has prior history of stroke.  eliquis on hold for surgery.  He was bridge with heparin. Heparin on hold since 10-26 night in anticipation of surgery.  Plan to resume low dose eliquis 10-28 per ortho.   3-CKD stage III;  Cr baseline 2.  Continue to monitor.  Stable.  Repeat labs tomorrow.   4-Thrombocytopenia Repeat labs in am.    5-Hiccup;  Started  baclofen PRN.  Improved.   Cognitive impairment. Per wife for last year.  check B 12 and TSH.   Acute blood loss anemia, post surgery;  IV fluids , monitor.   DVT prophylaxis: heparin  Code Status: full code.  Family Communication: no family at bedside.  Disposition Plan: remain in the hospital , surgery 10-27  Consultants:   Ortho    Procedures:   none   Antimicrobials:   none   Subjective: He is alert, post surgery.  Denies chest pain, dyspnea.    Objective: Vitals:   01/31/18 1109 01/31/18 1930 02/01/18 0455 02/01/18 1352  BP: 127/72 (!) 145/94 (!) 159/87 (!) 147/73  Pulse: 86 74 (!) 106 (!) 103  Resp:    18  Temp: 98.1 F (36.7 C) 98.3 F (36.8 C) 98.6 F (37 C) 97.7 F (36.5 C)  TempSrc: Oral Oral Oral   SpO2: 97% 95%  99%  Weight:      Height:        Intake/Output Summary (Last 24 hours) at  02/01/2018 1406 Last data filed at 02/01/2018 1350 Gross per 24 hour  Intake 2500 ml  Output 1600 ml  Net 900 ml   Filed Weights   01/29/18 1823 01/29/18 2351  Weight: 90.7 kg 87.2 kg    Examination:  General exam; NAD Respiratory system: CTA Cardiovascular system: S 1, S 2 RRR Gastrointestinal system: BS present, soft,  Central nervous system: alert, confuse Extremities: Right LE with clean dressing Skin: no rashes.    Data Reviewed: I have personally reviewed following labs and imaging studies  CBC: Recent Labs  Lab 01/29/18 1947 01/30/18 0310 01/31/18 0734 02/01/18 0400  WBC 8.5 8.1 6.2 6.7    NEUTROABS 7.3  --   --   --   HGB 14.4 13.5 12.9* 13.1  HCT 43.6 41.4 39.2 40.3  MCV 94.6 93.5 94.0 92.9  PLT 132* 132* PLATELET CLUMPS NOTED ON SMEAR, COUNT APPEARS DECREASED 95*   Basic Metabolic Panel: Recent Labs  Lab 01/29/18 1947 01/30/18 0310 01/31/18 0734  NA 139 140 139  K 3.8 4.2 3.9  CL 103 108 109  CO2 25 24 21*  GLUCOSE 98 119* 104*  BUN 33* 29* 24*  CREATININE 2.02* 1.94* 1.86*  CALCIUM 9.3 8.9 8.6*   GFR: Estimated Creatinine Clearance: 36.4 mL/min (A) (by C-G formula based on SCr of 1.86 mg/dL (H)). Liver Function Tests: No results for input(s): AST, ALT, ALKPHOS, BILITOT, PROT, ALBUMIN in the last 168 hours. No results for input(s): LIPASE, AMYLASE in the last 168 hours. No results for input(s): AMMONIA in the last 168 hours. Coagulation Profile: Recent Labs  Lab 01/29/18 1947  INR 1.19   Cardiac Enzymes: No results for input(s): CKTOTAL, CKMB, CKMBINDEX, TROPONINI in the last 168 hours. BNP (last 3 results) No results for input(s): PROBNP in the last 8760 hours. HbA1C: No results for input(s): HGBA1C in the last 72 hours. CBG: No results for input(s): GLUCAP in the last 168 hours. Lipid Profile: No results for input(s): CHOL, HDL, LDLCALC, TRIG, CHOLHDL, LDLDIRECT in the last 72 hours. Thyroid Function Tests: No results for input(s): TSH, T4TOTAL, FREET4, T3FREE, THYROIDAB in the last 72 hours. Anemia Panel: No results for input(s): VITAMINB12, FOLATE, FERRITIN, TIBC, IRON, RETICCTPCT in the last 72 hours. Sepsis Labs: No results for input(s): PROCALCITON, LATICACIDVEN in the last 168 hours.  Recent Results (from the past 240 hour(s))  Surgical PCR screen     Status: None   Collection Time: 01/30/18  2:17 AM  Result Value Ref Range Status   MRSA, PCR NEGATIVE NEGATIVE Final   Staphylococcus aureus NEGATIVE NEGATIVE Final    Comment: (NOTE) The Xpert SA Assay (FDA approved for NASAL specimens in patients 8 years of age and older), is one  component of a comprehensive surveillance program. It is not intended to diagnose infection nor to guide or monitor treatment. Performed at [facility], [place] [address], [place], [place] [address]          Radiology Studies: No results found.      Scheduled Meds: . chlorhexidine  60 mL Topical Once  . [MAR Hold] cholecalciferol  2,000 Units Oral QHS  . [MAR Hold] feeding supplement (ENSURE ENLIVE)  237 mL Oral BID BM  . [MAR Hold] polyethylene glycol  17 g Oral BID  . povidone-iodine  2 application Topical Once  . [MAR Hold] senna-docusate  1 tablet Oral BID  . [MAR Hold] tamsulosin  0.4 mg Oral QPC breakfast   Continuous Infusions: . lactated ringers  10 mL/hr at 02/01/18 0844     LOS: 3 days    Time spent: 35 minutes,     Elmarie Shiley, MD [facility] Pager [phone]  If 7PM-7AM, please contact night-coverage [email or web address] Password [email or web address] 02/01/2018, 2:06 PM

## 2018-02-01 NOTE — Interval H&P Note (Signed)
History and Physical Interval Note:  02/01/2018 9:07 AM  Jeffrey Hardy  has presented today for surgery, with the diagnosis of Right femoral neck fracture  The various methods of treatment have been discussed with the patient and family. After consideration of risks, benefits and other options for treatment, the patient has consented to  Procedure(s): TOTAL HIP ARTHROPLASTY ANTERIOR APPROACH (Right) as a surgical intervention .  The patient's history has been reviewed, patient examined, no change in status, stable for surgery.  I have reviewed the patient's chart and labs.  Questions were answered to the patient's satisfaction.     Hilton Cork Kyros Salzwedel

## 2018-02-01 NOTE — Transfer of Care (Signed)
Immediate Anesthesia Transfer of Care Note  Patient: Jeffrey Hardy  Procedure(s) Performed: TOTAL HIP ARTHROPLASTY ANTERIOR APPROACH (Right Hip)  Patient Location: PACU  Anesthesia Type:General  Level of Consciousness: awake, oriented and patient cooperative  Airway & Oxygen Therapy: Patient Spontanous Breathing and Patient connected to nasal cannula oxygen  Post-op Assessment: Report given to RN and Post -op Vital signs reviewed and stable  Post vital signs: Reviewed  Last Vitals:  Vitals Value Taken Time  BP 147/73 02/01/2018  1:54 PM  Temp    Pulse 92 02/01/2018  2:02 PM  Resp 21 02/01/2018  2:02 PM  SpO2 94 % 02/01/2018  2:02 PM  Vitals shown include unvalidated device data.  Last Pain:  Vitals:   02/01/18 0455  TempSrc: Oral  PainSc:       Patients Stated Pain Goal: 2 (91/79/21 7837)  Complications: No apparent anesthesia complications

## 2018-02-02 ENCOUNTER — Encounter (HOSPITAL_COMMUNITY): Payer: Self-pay | Admitting: Orthopedic Surgery

## 2018-02-02 LAB — BASIC METABOLIC PANEL
Anion gap: 8 (ref 5–15)
BUN: 29 mg/dL — ABNORMAL HIGH (ref 8–23)
CO2: 22 mmol/L (ref 22–32)
Calcium: 8.1 mg/dL — ABNORMAL LOW (ref 8.9–10.3)
Chloride: 107 mmol/L (ref 98–111)
Creatinine, Ser: 1.93 mg/dL — ABNORMAL HIGH (ref 0.61–1.24)
GFR calc Af Amer: 36 mL/min — ABNORMAL LOW (ref >60)
GFR calc non Af Amer: 31 mL/min — ABNORMAL LOW (ref >60)
Glucose, Bld: 128 mg/dL — ABNORMAL HIGH (ref 70–99)
Potassium: 4.4 mmol/L (ref 3.5–5.1)
Sodium: 137 mmol/L (ref 135–145)

## 2018-02-02 LAB — CBC
HCT: 31.8 % — ABNORMAL LOW (ref 39.0–52.0)
HCT: 28.2 % — ABNORMAL LOW (ref 39.0–52.0)
Hemoglobin: 10.3 g/dL — ABNORMAL LOW (ref 13.0–17.0)
Hemoglobin: 9.1 g/dL — ABNORMAL LOW (ref 13.0–17.0)
MCH: 30.8 pg (ref 26.0–34.0)
MCH: 30.3 pg (ref 26.0–34.0)
MCHC: 32.4 g/dL (ref 30.0–36.0)
MCHC: 32.3 g/dL (ref 30.0–36.0)
MCV: 95.2 fL (ref 80.0–100.0)
MCV: 94 fL (ref 80.0–100.0)
Platelets: 91 10*3/uL — ABNORMAL LOW (ref 150–400)
Platelets: 99 10*3/uL — ABNORMAL LOW (ref 150–400)
RBC: 3.34 MIL/uL — ABNORMAL LOW (ref 4.22–5.81)
RBC: 3 MIL/uL — ABNORMAL LOW (ref 4.22–5.81)
RDW: 13.4 % (ref 11.5–15.5)
RDW: 13.8 % (ref 11.5–15.5)
WBC: 9.1 10*3/uL (ref 4.0–10.5)
WBC: 10.4 10*3/uL (ref 4.0–10.5)
nRBC: 0 % (ref 0.0–0.2)
nRBC: 0 % (ref 0.0–0.2)

## 2018-02-02 LAB — TSH: TSH: 1.063 u[IU]/mL (ref 0.350–4.500)

## 2018-02-02 LAB — PREPARE RBC (CROSSMATCH)

## 2018-02-02 LAB — VITAMIN B12: Vitamin B-12: 244 pg/mL (ref 180–914)

## 2018-02-02 MED ORDER — BISACODYL 5 MG PO TBEC
5.0000 mg | DELAYED_RELEASE_TABLET | Freq: Once | ORAL | Status: AC
  Administered 2018-02-02: 5 mg via ORAL
  Filled 2018-02-02: qty 1

## 2018-02-02 MED ORDER — SODIUM CHLORIDE 0.9% IV SOLUTION
Freq: Once | INTRAVENOUS | Status: AC
  Administered 2018-02-02: 12:00:00 via INTRAVENOUS

## 2018-02-02 MED ORDER — SODIUM CHLORIDE 0.9 % IV SOLN
INTRAVENOUS | Status: DC
  Administered 2018-02-02: 12:00:00 via INTRAVENOUS

## 2018-02-02 MED ORDER — BISACODYL 10 MG RE SUPP: 10.0000 mg | Freq: Every day | RECTAL | Status: DC | PRN

## 2018-02-02 MED ORDER — BISACODYL 10 MG RE SUPP: 10.0000 mg | Freq: Once | RECTAL | Status: DC

## 2018-02-02 MED ORDER — VITAMIN B-12 100 MCG PO TABS
100.0000 ug | ORAL_TABLET | Freq: Every day | ORAL | Status: DC
  Administered 2018-02-02 – 2018-02-03 (×2): 100 ug via ORAL
  Filled 2018-02-02 (×3): qty 1

## 2018-02-02 NOTE — Evaluation (Signed)
Physical Therapy Evaluation Patient Details Name: Jeffrey Hardy MRN: [id number] DOB: 05-29-1935 Today's Date: 02/02/2018   History of Present Illness  82yo male who fell and hit his R hip, found to have femoral neck fracture. Received R anterior THA on 02/01/18. PMH anxiety, renal CA, CVA, syncope, HTN, A-fib, L THA june 2019, hx wrist fx   Clinical Impression   Patient received in bed, pleasant and willing to work with skilled PT services today. He requires ModA for functional bed mobility to assist in managing R LE, initially able to maintain upright sitting at EOB with VC, however as fatigue and nausea increased then required Min-ModA to maintain upright today. SpO2 at EOB 90-94% on room air, HR WNL, and BP 130/82. Unable to attempt transfer to chair or gait today due to severe nausea and dizziness, will strongly benefit from +2 to progress mobility for safety. MD and RN aware of patient status. He was left in bed with all needs met, bed alarm active, and family present. If mobility and activity tolerance does not significantly improve, he may benefit from ST-SNF moving forward pending surgeon's approval.     Follow Up Recommendations Follow surgeon's recommendation for DC plan and follow-up therapies;Other (comment)(may require SNF if mobility status does not significant improve )    Equipment Recommendations  Rolling walker with 5" wheels;3in1 (PT)    Recommendations for Other Services       Precautions / Restrictions Precautions Precautions: Fall Restrictions Weight Bearing Restrictions: Yes RLE Weight Bearing: Weight bearing as tolerated      Mobility  Bed Mobility Overal bed mobility: Needs Assistance Bed Mobility: Supine to Sit;Sit to Supine     Supine to sit: Mod assist Sit to supine: Mod assist   General bed mobility comments: ModA to bring R LE along EOB and to power up trunk, MinA to maintain upright sitting at EOB   Transfers                 General  transfer comment: unable to assess, nauseous/dizzy/woozy   Ambulation/Gait             General Gait Details: unable to assess, nauseous/dizzy/woozy   Stairs            Wheelchair Mobility    Modified Rankin (Stroke Patients Only)       Balance Overall balance assessment: Needs assistance Sitting-balance support: Bilateral upper extremity supported;Feet supported Sitting balance-Leahy Scale: Poor Sitting balance - Comments: Min-ModA to maintain upright as fatigue increased  Postural control: Posterior lean;Left lateral lean   Standing balance-Leahy Scale: Poor                               Pertinent Vitals/Pain Pain Assessment: 0-10 Pain Score: 4  Pain Location: R LE  Pain Descriptors / Indicators: Aching;Sore Pain Intervention(s): Limited activity within patient's tolerance;Monitored during session    [place] expects to be discharged to:: Private residence Living Arrangements: Spouse/significant other Available Help at Discharge: Family;Available 24 hours/day Type of Home: House Home Access: Stairs to enter Entrance Stairs-Rails: Right Entrance Stairs-Number of Steps: 2 Home Layout: One level;Laundry or work area in [place]: Environmental consultant - 2 wheels;Bedside commode      Prior Function Level of Independence: Independent         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     Hand Dominance        Extremity/Trunk Assessment  Upper Extremity Assessment Upper Extremity Assessment: Defer to OT evaluation    Lower Extremity Assessment Lower Extremity Assessment: Generalized weakness    Cervical / Trunk Assessment Cervical / Trunk Assessment: Kyphotic  Communication   Communication: No difficulties  Cognition Arousal/Alertness: Awake/alert Behavior During Therapy: WFL for tasks assessed/performed Overall Cognitive Status: Within Functional Limits for tasks assessed                                         General Comments General comments (skin integrity, edema, etc.): SpO2 on room air 90-94% with activity, BP 130/82, HR WNL throughout session. Severly limited by dizziness, nausea today.     Exercises     Assessment/Plan    PT Assessment Patient needs continued PT services  PT Problem List Decreased strength;Decreased knowledge of use of DME;Decreased activity tolerance;Decreased safety awareness;Decreased balance;Decreased knowledge of precautions;Decreased mobility;Decreased coordination       PT Treatment Interventions DME instruction;Balance training;Gait training;Neuromuscular re-education;Stair training;Functional mobility training;Patient/family education;Therapeutic activities;Therapeutic exercise    PT Goals (Current goals can be found in the Care Plan section)  Acute Rehab PT Goals Patient Stated Goal: to feel better  PT Goal Formulation: With patient/family Time For Goal Achievement: 02/16/18 Potential to Achieve Goals: Fair    Frequency 7X/week   Barriers to discharge        Co-evaluation               AM-PAC PT "6 Clicks" Daily Activity  Outcome Measure Difficulty turning over in bed (including adjusting bedclothes, sheets and blankets)?: Unable Difficulty moving from lying on back to sitting on the side of the bed? : Unable Difficulty sitting down on and standing up from a chair with arms (e.g., wheelchair, bedside commode, etc,.)?: Unable Help needed moving to and from a bed to chair (including a wheelchair)?: Total Help needed walking in hospital room?: Total Help needed climbing 3-5 steps with a railing? : Total 6 Click Score: 6    End of Session Equipment Utilized During Treatment: Gait belt Activity Tolerance: Patient limited by fatigue;Treatment limited secondary to medical complications (Comment)(dizziness/nausea ) Patient left: in bed;with call bell/phone within reach;with bed alarm set;with family/visitor present Nurse  Communication: Mobility status;Other (comment)(nausea ) PT Visit Diagnosis: Difficulty in walking, not elsewhere classified (R26.2);Unsteadiness on feet (R26.81);Other abnormalities of gait and mobility (R26.89);History of falling (Z91.81);Muscle weakness (generalized) (M62.81)    Time: 1007-1050 PT Time Calculation (min) (ACUTE ONLY): 43 min   Charges:   PT Evaluation $PT Eval Moderate Complexity: 1 Mod PT Treatments $Therapeutic Activity: 23-37 mins        Deniece Ree PT, DPT, CBIS  Supplemental Physical Therapist [place]    Pager [phone] Acute Rehab Office [phone]

## 2018-02-02 NOTE — Care Management Important Message (Signed)
[phone] Important Message  Patient Details  Name: Jeffrey Hardy MRN: [id number] Date of Birth: 11/14/1935   Medicare Important Message Given:  Yes    Ogle Hoeffner 02/02/2018, 4:20 PM

## 2018-02-02 NOTE — Discharge Instructions (Addendum)
° °Dr. Brian Swinteck °Joint Replacement Specialist °[facility] °[address] °[place],  [address] °[phone] ° ° °TOTAL HIP REPLACEMENT POSTOPERATIVE DIRECTIONS ° ° ° °Hip Rehabilitation, Guidelines Following Surgery  ° °WEIGHT BEARING °Weight bearing as tolerated with assist device (walker, cane, etc) as directed, use it as long as suggested by your surgeon or therapist, typically at least 4-6 weeks. ° °The results of a hip operation are greatly improved after range of motion and muscle strengthening exercises. Follow all safety measures which are given to protect your hip. If any of these exercises cause increased pain or swelling in your joint, decrease the amount until you are comfortable again. Then slowly increase the exercises. Call your caregiver if you have problems or questions.  ° °HOME CARE INSTRUCTIONS  °Most of the following instructions are designed to prevent the dislocation of your new hip.  °Remove items at home which could result in a fall. This includes throw rugs or furniture in walking pathways.  °Continue medications as instructed at time of discharge. °· You may have some home medications which will be placed on hold until you complete the course of blood thinner medication. °· You may start showering once you are discharged home. Do not remove your dressing. °Do not put on socks or shoes without following the instructions of your caregivers.   °Sit on chairs with arms. Use the chair arms to help push yourself up when arising.  °Arrange for the use of a toilet seat elevator so you are not sitting low.  °· Walk with walker as instructed.  °You may resume a sexual relationship in one month or when given the OK by your caregiver.  °Use walker as long as suggested by your caregivers.  °You may put full weight on your legs and walk as much as is comfortable. °Avoid periods of inactivity such as sitting longer than an hour when not asleep. This helps prevent  blood clots.  °You may return to work once you are cleared by your surgeon.  °Do not drive a car for 6 weeks or until released by your surgeon.  °Do not drive while taking narcotics.  °Wear elastic stockings for two weeks following surgery during the day but you may remove then at night.  °Make sure you keep all of your appointments after your operation with all of your doctors and caregivers. You should call the office at the above phone number and make an appointment for approximately two weeks after the date of your surgery. °Please pick up a stool softener and laxative for home use as long as you are requiring pain medications. °· ICE to the affected hip every three hours for 30 minutes at a time and then as needed for pain and swelling. Continue to use ice on the hip for pain and swelling from surgery. You may notice swelling that will progress down to the foot and ankle.  This is normal after surgery.  Elevate the leg when you are not up walking on it.   °It is important for you to complete the blood thinner medication as prescribed by your doctor. °· Continue to use the breathing machine which will help keep your temperature down.  It is common for your temperature to cycle up and down following surgery, especially at night when you are not up moving around and exerting yourself.  The breathing machine keeps your lungs expanded and your temperature down. ° °RANGE OF MOTION AND STRENGTHENING EXERCISES  °These exercises   are designed to help you keep full movement of your hip joint. Follow your caregiver's or physical therapist's instructions. Perform all exercises about fifteen times, three times per day or as directed. Exercise both hips, even if you have had only one joint replacement. These exercises can be done on a training (exercise) mat, on the floor, on a table or on a bed. Use whatever works the best and is most comfortable for you. Use music or television while you are exercising so that the exercises  are a pleasant break in your day. This will make your life better with the exercises acting as a break in routine you can look forward to.  Lying on your back, slowly slide your foot toward your buttocks, raising your knee up off the floor. Then slowly slide your foot back down until your leg is straight again.  Lying on your back spread your legs as far apart as you can without causing discomfort.  Lying on your side, raise your upper leg and foot straight up from the floor as far as is comfortable. Slowly lower the leg and repeat.  Lying on your back, tighten up the muscle in the front of your thigh (quadriceps muscles). You can do this by keeping your leg straight and trying to raise your heel off the floor. This helps strengthen the largest muscle supporting your knee.  Lying on your back, tighten up the muscles of your buttocks both with the legs straight and with the knee bent at a comfortable angle while keeping your heel on the floor.   SKILLED REHAB INSTRUCTIONS: If the patient is transferred to a skilled rehab facility following release from the hospital, a list of the current medications will be sent to the facility for the patient to continue.  When discharged from the skilled rehab facility, please have the facility set up the patient's [place] prior to being released. Also, the skilled facility will be responsible for providing the patient with their medications at time of release from the facility to include their pain medication and their blood thinner medication. If the patient is still at the rehab facility at time of the two week follow up appointment, the skilled rehab facility will also need to assist the patient in arranging follow up appointment in our office and any transportation needs.  MAKE SURE YOU:  Understand these instructions.  Will watch your condition.  Will get help right away if you are not doing well or get worse.  Pick up stool softner and  laxative for home use following surgery while on pain medications. Do not remove your dressing. The dressing is waterproof--it is OK to take showers. Continue to use ice for pain and swelling after surgery. Do not use any lotions or creams on the incision until instructed by your surgeon. Total Hip Protocol.   Information on my medicine - ELIQUIS (apixaban)  Why was Eliquis prescribed for you? Eliquis was prescribed for you to reduce the risk of a blood clot forming that can cause a stroke if you have a medical condition called atrial fibrillation (a type of irregular heartbeat).  What do You need to know about Eliquis ? Take your Eliquis TWICE DAILY - one tablet in the morning and one tablet in the evening with or without food. If you have difficulty swallowing the tablet whole please discuss with your pharmacist how to take the medication safely.  Take Eliquis exactly as prescribed by your doctor and DO NOT stop  taking Eliquis without talking to the doctor who prescribed the medication.  Stopping may increase your risk of developing a stroke.  Refill your prescription before you run out.  After discharge, you should have regular check-up appointments with your healthcare provider that is prescribing your Eliquis.  In the future your dose may need to be changed if your kidney function or weight changes by a significant amount or as you get older.  What do you do if you miss a dose? If you miss a dose, take it as soon as you remember on the same day and resume taking twice daily.  Do not take more than one dose of ELIQUIS at the same time to make up a missed dose.  Important Safety Information A possible side effect of Eliquis is bleeding. You should call your healthcare provider right away if you experience any of the following: ? Bleeding from an injury or your nose that does not stop. ? Unusual colored urine (red or dark brown) or unusual colored stools (red or  black). ? Unusual bruising for unknown reasons. ? A serious fall or if you hit your head (even if there is no bleeding).  Some medicines may interact with Eliquis and might increase your risk of bleeding or clotting while on Eliquis. To help avoid this, consult your healthcare provider or pharmacist prior to using any new prescription or non-prescription medications, including herbals, vitamins, non-steroidal anti-inflammatory drugs (NSAIDs) and supplements.  This website has more information on Eliquis (apixaban): [email or web address].com/eliquis/home   Additional discharge instructions  Please get your medications reviewed and adjusted by your Primary MD.  Please request your Primary MD to go over all Hospital Tests and Procedure/Radiological results at the follow up, please get all Hospital records sent to your Prim MD by signing hospital release before you go home.  If you had Pneumonia of Lung problems at the Hospital: Please get a 2 view Chest X ray done in 6-8 weeks after hospital discharge or sooner if instructed by your Primary MD.  If you have Congestive Heart Failure: Please call your Cardiologist or Primary MD anytime you have any of the following symptoms:  1) 3 pound weight gain in 24 hours or 5 pounds in 1 week  2) shortness of breath, with or without a dry hacking cough  3) swelling in the hands, feet or stomach  4) if you have to sleep on extra pillows at night in order to breathe  Follow cardiac low salt diet and 1.5 lit/day fluid restriction.  If you have diabetes Accuchecks 4 times/day, Once in AM empty stomach and then before each meal. Log in all results and show them to your primary doctor at your next visit. If any glucose reading is under 80 or above 300 call your primary MD immediately.  If you have Seizure/Convulsions/Epilepsy: Please do not drive, operate heavy machinery, participate in activities at heights or participate in high speed sports until you  have seen by Primary MD or a Neurologist and advised to do so again.  If you had Gastrointestinal Bleeding: Please ask your Primary MD to check a complete blood count within one week of discharge or at your next visit. Your endoscopic/colonoscopic biopsies that are pending at the time of discharge, will also need to followed by your Primary MD.  Get Medicines reviewed and adjusted. Please take all your medications with you for your next visit with your Primary MD  Please request your Primary MD to go over all hospital tests  and procedure/radiological results at the follow up, please ask your Primary MD to get all Hospital records sent to his/her office.  If you experience worsening of your admission symptoms, develop shortness of breath, life threatening emergency, suicidal or homicidal thoughts you must seek medical attention immediately by calling 911 or calling your MD immediately  if symptoms less severe.  You must read complete instructions/literature along with all the possible adverse reactions/side effects for all the Medicines you take and that have been prescribed to you. Take any new Medicines after you have completely understood and accpet all the possible adverse reactions/side effects.   Do not drive or operate heavy machinery when taking Pain medications.   Do not take more than prescribed Pain, Sleep and Anxiety Medications  Special Instructions: If you have smoked or chewed Tobacco  in the last 2 yrs please stop smoking, stop any regular Alcohol  and or any Recreational drug use.  Wear Seat belts while driving.  Please note You were cared for by a hospitalist during your hospital stay. If you have any questions about your discharge medications or the care you received while you were in the hospital after you are discharged, you can call the unit and asked to speak with the hospitalist on call if the hospitalist that took care of you is not available. Once you are discharged,  your primary care physician will handle any further medical issues. Please note that NO REFILLS for any discharge medications will be authorized once you are discharged, as it is imperative that you return to your primary care physician (or establish a relationship with a primary care physician if you do not have one) for your aftercare needs so that they can reassess your need for medications and monitor your lab values.  You can reach the hospitalist office at phone [phone] or fax [phone]   If you do not have a primary care physician, you can call [phone] for a physician referral.

## 2018-02-02 NOTE — Progress Notes (Signed)
    Subjective:  Patient reports pain as mild to moderate.  Denies V/CP/SOB. Had nausea this am. No c/o.  Objective:   VITALS:   Vitals:   02/02/18 0539 02/02/18 0759 02/02/18 1220 02/02/18 1235  BP: (!) 143/72 130/74 100/76 132/77  Pulse: 89 77 77 90  Resp:   16 16  Temp: 98.6 F (37 C) (!) 97.1 F (36.2 C) 97.6 F (36.4 C) 98.9 F (37.2 C)  TempSrc: Oral Rectal Oral Oral  SpO2: 95% 94% 97% 93%  Weight:      Height:        NAD ABD soft Intact pulses distally Incision: dressing C/D/I RLE foot drop - stable from preop  Lab Results  Component Value Date   WBC 10.4 02/02/2018   HGB 9.1 (L) 02/02/2018   HCT 28.2 (L) 02/02/2018   MCV 94.0 02/02/2018   PLT 99 (L) 02/02/2018   BMET    Component Value Date/Time   NA 137 02/02/2018 0445   K 4.4 02/02/2018 0445   CL 107 02/02/2018 0445   CO2 22 02/02/2018 0445   GLUCOSE 128 (H) 02/02/2018 0445   BUN 29 (H) 02/02/2018 0445   CREATININE 1.93 (H) 02/02/2018 0445   CALCIUM 8.1 (L) 02/02/2018 0445   GFRNONAA 31 (L) 02/02/2018 0445   GFRAA 36 (L) 02/02/2018 0445     Assessment/Plan: 1 Day Post-Op   Principal Problem:   Closed right hip fracture, initial encounter (HCC) Active Problems:   Thrombocytopenia (HCC)   Renal cell cancer (HCC)   CKD (chronic kidney disease) stage 3, GFR 30-59 ml/min (HCC)   PAF (paroxysmal atrial fibrillation) ([place])   Closed displaced fracture of right femoral neck ([place])   WBAT with walker DVT ppx: eliquis, SCDs, TEDS PO pain control PT/OT Dispo: D/C home with HHPT when medically ready   Hilton Cork Joanann Mies 02/02/2018, 12:53 PM   Rod Can, MD Cell: [phone] [facility]  [place] [address], [place], [place], [place] [address] Phone: [phone] [email or web address] Facebook  [employer]

## 2018-02-02 NOTE — Progress Notes (Signed)
Physical Therapy Treatment Patient Details Name: Jeffrey Hardy MRN: [id number] DOB: 06-20-1935 Today's Date: 02/02/2018    History of Present Illness 81yo male who fell and hit his R hip, found to have femoral neck fracture. Received R anterior THA on 02/01/18. PMH anxiety, renal CA, CVA, syncope, HTN, A-fib, L THA june 2019, hx wrist fx     PT Comments    Patient received in bed, pleasant and reports he is feeling much better, willing to participate in PT this afternoon. He continues to require ModA for functional transfers mostly to bring trunk up at EOB, however, and was able to maintain midline sitting at EOB with Min guard to close S today. He required ModA for sit to stand with RW, Max cues to come to full standing and extend hips rather than staying in flexed/squatting position; very unsteady once in full upright standing and required ModA for balance due to posterior unsteadiness/bias. Able to gait train approximately 68f with MinA for balance and safety with RW, very fatigued after navigating this gait distance. MaxA to lower to chair and control descent to recliner seat. SpO2 between 90-96% on room air during activity, HR 102-115 with activity today. He was left up in the chair with all needs met, RN aware of patient status and PT recommendations for mobility for back to bed.     Follow Up Recommendations  Follow surgeon's recommendation for DC plan and follow-up therapies;Other (comment)(may require SNF if mobility and safety do not significantly improve )     Equipment Recommendations  Rolling walker with 5" wheels;3in1 (PT)    Recommendations for Other Services       Precautions / Restrictions Precautions Precautions: Fall;Other (comment) Precaution Comments: watch HR and SpO2  Restrictions Weight Bearing Restrictions: Yes RLE Weight Bearing: Weight bearing as tolerated    Mobility  Bed Mobility Overal bed mobility: Needs Assistance Bed Mobility: Supine to Sit      Supine to sit: Mod assist     General bed mobility comments: ModA to power up trunk, mod VC to fully scoot to EOB but able to maintain upright with close S this afternoon. Able to scoot legs off EOB with extended time.   Transfers Overall transfer level: Needs assistance Equipment used: Rolling walker (2 wheeled) Transfers: Sit to/from Stand Sit to Stand: Mod assist;From elevated surface         General transfer comment: ModA to come to full stand, required Max VC to extend hips and straighted up from flexed/squatting position. Very unsteady with strong posterior lean initially, required ModA to gain balance with RW   Ambulation/Gait Ambulation/Gait assistance: Min assist Gait Distance (Feet): 5 Feet Assistive device: Rolling walker (2 wheeled) Gait Pattern/deviations: Step-to pattern;Decreased step length - right;Decreased step length - left;Decreased stance time - right;Decreased stance time - left;Decreased stride length;Antalgic Gait velocity: decreased    General Gait Details: extended time and MinA for balance with RW but good sequencing. Limited by fatigue with gait to chair.    Stairs             Wheelchair Mobility    Modified Rankin (Stroke Patients Only)       Balance Overall balance assessment: Needs assistance Sitting-balance support: Bilateral upper extremity supported;Feet supported Sitting balance-Leahy Scale: Fair Sitting balance - Comments: min guard to close S to maintain upright in afternoon    Standing balance support: Bilateral upper extremity supported;During functional activity Standing balance-Leahy Scale: Poor Standing balance comment: strong posterior lean in standing requiring  ModA to gain balance                             Cognition Arousal/Alertness: Awake/alert Behavior During Therapy: WFL for tasks assessed/performed Overall Cognitive Status: Within Functional Limits for tasks assessed                                         Exercises      General Comments General comments (skin integrity, edema, etc.): SpO2 90-96% with activity on room air, HR from 102-115BPM with activity during session.       Pertinent Vitals/Pain Pain Assessment: 0-10 Pain Score: 4  Pain Location: R LE  Pain Descriptors / Indicators: Aching;Sore Pain Intervention(s): Limited activity within patient's tolerance;Monitored during session;Repositioned    Home Living                      Prior Function            PT Goals (current goals can now be found in the care plan section) Acute Rehab PT Goals Patient Stated Goal: to feel better  PT Goal Formulation: With patient/family Time For Goal Achievement: 02/16/18 Potential to Achieve Goals: Fair Progress towards PT goals: Progressing toward goals    Frequency    7X/week      PT Plan Current plan remains appropriate    Co-evaluation              AM-PAC PT "6 Clicks" Daily Activity  Outcome Measure  Difficulty turning over in bed (including adjusting bedclothes, sheets and blankets)?: Unable Difficulty moving from lying on back to sitting on the side of the bed? : Unable Difficulty sitting down on and standing up from a chair with arms (e.g., wheelchair, bedside commode, etc,.)?: Unable Help needed moving to and from a bed to chair (including a wheelchair)?: A Lot Help needed walking in hospital room?: A Lot Help needed climbing 3-5 steps with a railing? : Total 6 Click Score: 8    End of Session Equipment Utilized During Treatment: Gait belt Activity Tolerance: Patient limited by fatigue Patient left: in chair;with call bell/phone within reach Nurse Communication: Mobility status PT Visit Diagnosis: Difficulty in walking, not elsewhere classified (R26.2);Unsteadiness on feet (R26.81);Other abnormalities of gait and mobility (R26.89);History of falling (Z91.81);Muscle weakness (generalized) (M62.81)     Time: 3833-3832 PT  Time Calculation (min) (ACUTE ONLY): 38 min  Charges:  $Gait Training: 8-22 mins $Therapeutic Activity: 23-37 mins                     Deniece Ree PT, DPT, CBIS  Supplemental Physical Therapist [place]    Pager [phone] Acute Rehab Office [phone]

## 2018-02-02 NOTE — Progress Notes (Signed)
PROGRESS NOTE    Jeffrey Hardy  OQH:[id number] DOB: 10/10/1935 DOA: 01/29/2018 PCP: Biagio Borg, MD    Brief Narrative: Jeffrey Hardy is a 82 y.o. male with medical history significant for paroxysmal atrial fibrillation on Eliquis, chronic mild thrombocytopenia, CVA, and chronic kidney disease stage III, now presenting to the emergency department with severe right hip pain and inability to bear weight after a fall.  Patient reports that he was in his usual state of health, having an uneventful day, and was walking out to his barn, started up a knoll, and lost his balance, falling onto his right side.  He experienced immediate and severe pain at the right hip and was unable to bear weight.  Reports that he is fairly active at home, able to ascend a flight of stairs without any significant shortness of breath, and reports that he never experiences chest pain.  No recent fevers, chills, cough, dyspnea, melena, or hematochezia.  [facility] ED Course: Upon arrival to the ED, patient is found to be afebrile, saturating well on room air, and with vitals otherwise normal.  EKG features a sinus rhythm and chest x-ray is negative for acute cardiopulmonary disease.  Radiographs of the hips and pelvis demonstrate acute right subcapital femoral neck fracture.  Chemistry panel features a creatinine of 2.02, similar to priors.  CBC is notable for a slight thrombocytopenia.  Orthopedic surgery was consulted by the ED physician and a medical admission to [facility] was recommended.    Assessment & Plan:   Principal Problem:   Closed right hip fracture, initial encounter [facility]) Active Problems:   Thrombocytopenia (HCC)   Renal cell cancer (HCC)   CKD (chronic kidney disease) stage 3, GFR 30-59 ml/min (HCC)   PAF (paroxysmal atrial fibrillation) ([place])   Closed displaced fracture of right femoral neck (HCC)   1-Right hip fracture, displaced.  Was on eliquis prior to admission. Last  dose 10-24. Plan for sx on Sunday morning.  For sx 10-27 right total hip arthroplasty.  Hb drop 4 gram. He is lightheaded, nauseous just sitting in bed. Will transfuse one unit.   2-Paroxysmal A fib;  Has prior history of stroke.  eliquis on hold for surgery.  He was bridge with heparin. Heparin on hold since 10-26 night in anticipation of surgery.  Plan to resume low dose eliquis 10-28 per ortho.   3-CKD stage III;  Cr baseline 2.  Continue to monitor.  Monitor closely post surgery   4-Thrombocytopenia Repeat labs in am.    5-Hiccup;  Started  baclofen PRN.  Improved.   Cognitive impairment. Per wife for last year.  check B 12 244, will start supplement  and TSH normal.   Acute blood loss anemia, post surgery;  IV fluids , monitor.  Will transfuse one unit PRBC>   Nausea;  PRN zofran.  Bowel regimen.   DVT prophylaxis: heparin  Code Status: full code.  Family Communication: no family at bedside.  Disposition Plan: remain in the hospital , surgery 10-27  Consultants:   Ortho    Procedures:   none   Antimicrobials:   none   Subjective: Got dizzy just trying to sit in bed.  Nausea and mild vomiting.    Objective: Vitals:   02/01/18 1536 02/01/18 2138 02/02/18 0539 02/02/18 0759  BP: 113/78 135/84 (!) 143/72 130/74  Pulse: 83 (!) 102 89 77  Resp: 18     Temp: 98 F (36.7 C) 98.4 F (36.9  C) 98.6 F (37 C) (!) 97.1 F (36.2 C)  TempSrc: Oral Oral Oral Rectal  SpO2: 100% 95% 95% 94%  Weight:      Height:        Intake/Output Summary (Last 24 hours) at 02/02/2018 1050 Last data filed at 02/02/2018 0900 Gross per 24 hour  Intake 2590.27 ml  Output 1600 ml  Net 990.27 ml   Filed Weights   01/29/18 1823 01/29/18 2351  Weight: 90.7 kg 87.2 kg    Examination:  General exam; NAD Respiratory system: CTA Cardiovascular system: S 1, S 2 RRR Gastrointestinal system: BS present, soft  Central nervous system: Alert, confuse.  Extremities:  Right LE with clean dressing.  Skin: no rashes.    Data Reviewed: I have personally reviewed following labs and imaging studies  CBC: Recent Labs  Lab 01/29/18 1947 01/30/18 0310 01/31/18 0734 02/01/18 0400 02/01/18 1450 02/02/18 0445  WBC 8.5 8.1 6.2 6.7 9.1 10.4  NEUTROABS 7.3  --   --   --   --   --   HGB 14.4 13.5 12.9* 13.1 10.3* 9.1*  HCT 43.6 41.4 39.2 40.3 31.8* 28.2*  MCV 94.6 93.5 94.0 92.9 95.2 94.0  PLT 132* 132* PLATELET CLUMPS NOTED ON SMEAR, COUNT APPEARS DECREASED 95* 91* 99*   Basic Metabolic Panel: Recent Labs  Lab 01/29/18 1947 01/30/18 0310 01/31/18 0734 02/02/18 0445  NA 139 140 139 137  K 3.8 4.2 3.9 4.4  CL 103 108 109 107  CO2 25 24 21* 22  GLUCOSE 98 119* 104* 128*  BUN 33* 29* 24* 29*  CREATININE 2.02* 1.94* 1.86* 1.93*  CALCIUM 9.3 8.9 8.6* 8.1*   GFR: Estimated Creatinine Clearance: 35.1 mL/min (A) (by C-G formula based on SCr of 1.93 mg/dL (H)). Liver Function Tests: No results for input(s): AST, ALT, ALKPHOS, BILITOT, PROT, ALBUMIN in the last 168 hours. No results for input(s): LIPASE, AMYLASE in the last 168 hours. No results for input(s): AMMONIA in the last 168 hours. Coagulation Profile: Recent Labs  Lab 01/29/18 1947  INR 1.19   Cardiac Enzymes: No results for input(s): CKTOTAL, CKMB, CKMBINDEX, TROPONINI in the last 168 hours. BNP (last 3 results) No results for input(s): PROBNP in the last 8760 hours. HbA1C: No results for input(s): HGBA1C in the last 72 hours. CBG: No results for input(s): GLUCAP in the last 168 hours. Lipid Profile: No results for input(s): CHOL, HDL, LDLCALC, TRIG, CHOLHDL, LDLDIRECT in the last 72 hours. Thyroid Function Tests: Recent Labs    02/02/18 0445  TSH 1.063   Anemia Panel: Recent Labs    02/02/18 0445  VITAMINB12 244   Sepsis Labs: No results for input(s): PROCALCITON, LATICACIDVEN in the last 168 hours.  Recent Results (from the past 240 hour(s))  Surgical PCR screen      Status: None   Collection Time: 01/30/18  2:17 AM  Result Value Ref Range Status   MRSA, PCR NEGATIVE NEGATIVE Final   Staphylococcus aureus NEGATIVE NEGATIVE Final    Comment: (NOTE) The Xpert SA Assay (FDA approved for NASAL specimens in patients 73 years of age and older), is one component of a comprehensive surveillance program. It is not intended to diagnose infection nor to guide or monitor treatment. Performed at [facility], [place] [address], [place], [place] [address]          Radiology Studies: Pelvis Portable  Result Date: 02/01/2018 CLINICAL DATA:  Status post right hip replacement. EXAM: PORTABLE PELVIS 1-2 VIEWS  COMPARISON:  C-arm images obtained earlier today. FINDINGS: Bilateral hip prostheses in satisfactory position and alignment. No fracture or dislocation. Lower lumbar spine degenerative changes. IMPRESSION: Satisfactory postoperative appearance of the right hip prosthesis. Electronically Signed   By: Claudie Revering M.D.   On: 02/01/2018 15:31   Dg C-arm 1-60 Min  Result Date: 02/01/2018 CLINICAL DATA:  Anterior approach right total hip arthroplasty EXAM: DG C-ARM 61-120 MIN; OPERATIVE RIGHT HIP WITH PELVIS FLUOROSCOPY TIME:  34 seconds COMPARISON:  Right hip radiographs-01/29/2018 FINDINGS: Two spot intraoperative fluoroscopic images of the right hip and lower pelvis are provided for review. Images demonstrate the sequela of interval right total hip replacement with cerclage fixation of the proximal femur. Alignment appears anatomic given AP projection. There is a minimal amount of subcutaneous emphysema about the operative site. No radiopaque foreign body. Stable sequela of left total hip replacement, incompletely evaluated. IMPRESSION: Post right total hip replacement with cerclage fixation of the proximal femur as detailed above. Electronically Signed   By: Sandi Mariscal M.D.   On: 02/01/2018 14:23   Dg C-arm 1-60 Min  Result Date: 02/01/2018 CLINICAL DATA:   Anterior approach right total hip arthroplasty EXAM: DG C-ARM 61-120 MIN; OPERATIVE RIGHT HIP WITH PELVIS FLUOROSCOPY TIME:  34 seconds COMPARISON:  Right hip radiographs-01/29/2018 FINDINGS: Two spot intraoperative fluoroscopic images of the right hip and lower pelvis are provided for review. Images demonstrate the sequela of interval right total hip replacement with cerclage fixation of the proximal femur. Alignment appears anatomic given AP projection. There is a minimal amount of subcutaneous emphysema about the operative site. No radiopaque foreign body. Stable sequela of left total hip replacement, incompletely evaluated. IMPRESSION: Post right total hip replacement with cerclage fixation of the proximal femur as detailed above. Electronically Signed   By: Sandi Mariscal M.D.   On: 02/01/2018 14:23   Dg Hip Operative Unilat W Or W/o Pelvis Right  Result Date: 02/01/2018 CLINICAL DATA:  Anterior approach right total hip arthroplasty EXAM: DG C-ARM 61-120 MIN; OPERATIVE RIGHT HIP WITH PELVIS FLUOROSCOPY TIME:  34 seconds COMPARISON:  Right hip radiographs-01/29/2018 FINDINGS: Two spot intraoperative fluoroscopic images of the right hip and lower pelvis are provided for review. Images demonstrate the sequela of interval right total hip replacement with cerclage fixation of the proximal femur. Alignment appears anatomic given AP projection. There is a minimal amount of subcutaneous emphysema about the operative site. No radiopaque foreign body. Stable sequela of left total hip replacement, incompletely evaluated. IMPRESSION: Post right total hip replacement with cerclage fixation of the proximal femur as detailed above. Electronically Signed   By: Sandi Mariscal M.D.   On: 02/01/2018 14:23        Scheduled Meds: . sodium chloride   Intravenous Once  . apixaban  2.5 mg Oral BID  . bisacodyl  5 mg Oral Once  . cholecalciferol  2,000 Units Oral QHS  . docusate sodium  100 mg Oral BID  . feeding supplement  (ENSURE ENLIVE)  237 mL Oral BID BM  . polyethylene glycol  17 g Oral BID  . senna-docusate  1 tablet Oral BID  . tamsulosin  0.4 mg Oral QPC breakfast  . vitamin B-12  100 mcg Oral Daily   Continuous Infusions: . sodium chloride    . lactated ringers 10 mL/hr at 02/01/18 0844     LOS: 4 days    Time spent: 35 minutes,     Elmarie Shiley, MD [facility] Pager [phone]  If 7PM-7AM,  please contact night-coverage [email or web address] Password [email or web address] 02/02/2018, 10:50 AM

## 2018-02-03 LAB — CBC
HCT: 27.6 % — ABNORMAL LOW (ref 39.0–52.0)
Hemoglobin: 9.1 g/dL — ABNORMAL LOW (ref 13.0–17.0)
MCH: 30.6 pg (ref 26.0–34.0)
MCHC: 33 g/dL (ref 30.0–36.0)
MCV: 92.9 fL (ref 80.0–100.0)
Platelets: 97 10*3/uL — ABNORMAL LOW (ref 150–400)
RBC: 2.97 MIL/uL — ABNORMAL LOW (ref 4.22–5.81)
RDW: 15.3 % (ref 11.5–15.5)
WBC: 6.3 10*3/uL (ref 4.0–10.5)
nRBC: 0 % (ref 0.0–0.2)

## 2018-02-03 LAB — TYPE AND SCREEN
ABO/RH(D): B POS
Antibody Screen: NEGATIVE
Unit division: 0

## 2018-02-03 LAB — BASIC METABOLIC PANEL
Anion gap: 7 (ref 5–15)
BUN: 36 mg/dL — ABNORMAL HIGH (ref 8–23)
CO2: 24 mmol/L (ref 22–32)
Calcium: 8.2 mg/dL — ABNORMAL LOW (ref 8.9–10.3)
Chloride: 108 mmol/L (ref 98–111)
Creatinine, Ser: 2.01 mg/dL — ABNORMAL HIGH (ref 0.61–1.24)
GFR calc Af Amer: 35 mL/min — ABNORMAL LOW (ref >60)
GFR calc non Af Amer: 30 mL/min — ABNORMAL LOW (ref >60)
Glucose, Bld: 102 mg/dL — ABNORMAL HIGH (ref 70–99)
Potassium: 4.2 mmol/L (ref 3.5–5.1)
Sodium: 139 mmol/L (ref 135–145)

## 2018-02-03 LAB — BPAM RBC
Blood Product Expiration Date: 201911032359
ISSUE DATE / TIME: 201910281208
Unit Type and Rh: 1700

## 2018-02-03 MED ORDER — SORBITOL 70 % SOLN
30.0000 mL | Freq: Once | Status: AC
  Administered 2018-02-03: 30 mL via ORAL
  Filled 2018-02-03: qty 30

## 2018-02-03 MED ORDER — HYDROCODONE-ACETAMINOPHEN 5-325 MG PO TABS: 1.0000 | ORAL_TABLET | ORAL | 0 refills | Status: DC | PRN

## 2018-02-03 NOTE — Progress Notes (Signed)
CSW spoke with patient's wife who reports they have gone through this before and were able to manage at home with assistance from daily CNA. Patient's wife reports she would like to go home with patient and she is able to manage his needs there. CSW encouraged patient wife to reach out with any further needs CSW can assist with.   RNCM notified.   [place], [place]

## 2018-02-03 NOTE — Progress Notes (Signed)
PROGRESS NOTE    AYINDE Hardy  YBO:[id number] DOB: [birth year] DOA: 01/29/2018 PCP: Jeffrey Borg, MD    Brief Narrative: Jeffrey Hardy is a 82 y.o. male with medical history significant for paroxysmal atrial fibrillation on Eliquis, chronic mild thrombocytopenia, CVA, and chronic kidney disease stage III, now presenting to the emergency department with severe right hip pain and inability to bear weight after a fall.  Patient reports that he was in his usual state of health, having an uneventful day, and was walking out to his barn, started up a knoll, and lost his balance, falling onto his right side.  He experienced immediate and severe pain at the right hip and was unable to bear weight.  Reports that he is fairly active at home, able to ascend a flight of stairs without any significant shortness of breath, and reports that he never experiences chest pain.  No recent fevers, chills, cough, dyspnea, melena, or hematochezia.  [facility] ED Course: Upon arrival to the ED, patient is found to be afebrile, saturating well on room air, and with vitals otherwise normal.  EKG features a sinus rhythm and chest x-ray is negative for acute cardiopulmonary disease.  Radiographs of the hips and pelvis demonstrate acute right subcapital femoral neck fracture.  Chemistry panel features a creatinine of 2.02, similar to priors.  CBC is notable for a slight thrombocytopenia.  Orthopedic surgery was consulted by the ED physician and a medical admission to [facility] was recommended.  Admitted for right hip fracture. He underwent sx 10-24. Surgery was delay because patient was on eliquis prior to admission.  Post op he develop acute blood loss anemia. He received one unit PRBC 10-28. Plan is to repeat hb 10-30.   Assessment & Plan:   Principal Problem:   Closed right hip fracture, initial encounter [facility]) Active Problems:   Thrombocytopenia (HCC)   Renal cell cancer (HCC)   CKD (chronic  kidney disease) stage 3, GFR 30-59 ml/min (HCC)   PAF (paroxysmal atrial fibrillation) ([place])   Closed displaced fracture of right femoral neck (HCC)   1-Right hip fracture, displaced.  Was on eliquis prior to admission. Last dose 10-24. Plan for sx on Sunday morning.  For sx 10-27 right total hip arthroplasty.  Hb drop 4 gram. He is lightheaded, nauseous just sitting in bed. Received one unit PRBC 10-28 Hb post transfusion didn't increase remain at 9. Plan to repeat hb tomorrow.   2-Paroxysmal A fib;  Has prior history of stroke.  He was bridge with heparin. Heparin was discontinue prior to surgery  Plan to resume low dose eliquis 10-28 per ortho. Low dose for at least 48 hours.   3-CKD stage III;  Cr baseline 2.  Continue to monitor.  Monitor closely post surgery  Stable.   4-Thrombocytopenia Repeat labs in am.  Stable.   5-Hiccup;  Started  baclofen PRN.  Improved.   Cognitive impairment. Per wife for last year.  B 12 244, started supplement  and TSH normal.   Acute blood loss anemia, post surgery;  S/P one unit PRBC> 10-28 Repeat hb in am.   Nausea;  PRN zofran.  Bowel regimen.  Resolved.   Constipation;  Sorbitol ordered.   DVT prophylaxis: heparin  Code Status: full code.  Family Communication: no family at bedside.  Disposition Plan: remain in the hospital , surgery 10-27  Consultants:   Ortho    Procedures:   none   Antimicrobials:   none  Subjective: He is sitting in recliner today. Denies nausea.  No BM yet    Objective: Vitals:   02/02/18 1235 02/02/18 1625 02/02/18 2038 02/03/18 0508  BP: 132/77 112/62 124/75 114/71  Pulse: 90 82 89 78  Resp: 16 18 20 20   Temp: 98.9 F (37.2 C) (!) 96.8 F (36 C) 98.2 F (36.8 C) 98.6 F (37 C)  TempSrc: Oral Oral Oral Oral  SpO2: 93% 95% 96% 97%  Weight:      Height:        Intake/Output Summary (Last 24 hours) at 02/03/2018 1439 Last data filed at 02/03/2018 0602 Gross per 24 hour   Intake 701.16 ml  Output 1400 ml  Net -698.84 ml   Filed Weights   01/29/18 1823 01/29/18 2351  Weight: 90.7 kg 87.2 kg    Examination:  General exam; NAD Respiratory system: CTA Cardiovascular system: S 1, S 2 RRR Gastrointestinal system: BS present, soft, nt Central nervous system: Alert, confuse.  Extremities: right LE with dressing.  Skin: no rashes.    Data Reviewed: I have personally reviewed following labs and imaging studies  CBC: Recent Labs  Lab 01/29/18 1947  01/31/18 0734 02/01/18 0400 02/01/18 1450 02/02/18 0445 02/03/18 0420  WBC 8.5   < > 6.2 6.7 9.1 10.4 6.3  NEUTROABS 7.3  --   --   --   --   --   --   HGB 14.4   < > 12.9* 13.1 10.3* 9.1* 9.1*  HCT 43.6   < > 39.2 40.3 31.8* 28.2* 27.6*  MCV 94.6   < > 94.0 92.9 95.2 94.0 92.9  PLT 132*   < > PLATELET CLUMPS NOTED ON SMEAR, COUNT APPEARS DECREASED 95* 91* 99* 97*   < > = values in this interval not displayed.   Basic Metabolic Panel: Recent Labs  Lab 01/29/18 1947 01/30/18 0310 01/31/18 0734 02/02/18 0445 02/03/18 0420  NA 139 140 139 137 139  K 3.8 4.2 3.9 4.4 4.2  CL 103 108 109 107 108  CO2 25 24 21* 22 24  GLUCOSE 98 119* 104* 128* 102*  BUN 33* 29* 24* 29* 36*  CREATININE 2.02* 1.94* 1.86* 1.93* 2.01*  CALCIUM 9.3 8.9 8.6* 8.1* 8.2*   GFR: Estimated Creatinine Clearance: 33.7 mL/min (A) (by C-G formula based on SCr of 2.01 mg/dL (H)). Liver Function Tests: No results for input(s): AST, ALT, ALKPHOS, BILITOT, PROT, ALBUMIN in the last 168 hours. No results for input(s): LIPASE, AMYLASE in the last 168 hours. No results for input(s): AMMONIA in the last 168 hours. Coagulation Profile: Recent Labs  Lab 01/29/18 1947  INR 1.19   Cardiac Enzymes: No results for input(s): CKTOTAL, CKMB, CKMBINDEX, TROPONINI in the last 168 hours. BNP (last 3 results) No results for input(s): PROBNP in the last 8760 hours. HbA1C: No results for input(s): HGBA1C in the last 72 hours. CBG: No  results for input(s): GLUCAP in the last 168 hours. Lipid Profile: No results for input(s): CHOL, HDL, LDLCALC, TRIG, CHOLHDL, LDLDIRECT in the last 72 hours. Thyroid Function Tests: Recent Labs    02/02/18 0445  TSH 1.063   Anemia Panel: Recent Labs    02/02/18 0445  VITAMINB12 244   Sepsis Labs: No results for input(s): PROCALCITON, LATICACIDVEN in the last 168 hours.  Recent Results (from the past 240 hour(s))  Surgical PCR screen     Status: None   Collection Time: 01/30/18  2:17 AM  Result Value Ref Range Status  MRSA, PCR NEGATIVE NEGATIVE Final   Staphylococcus aureus NEGATIVE NEGATIVE Final    Comment: (NOTE) The Xpert SA Assay (FDA approved for NASAL specimens in patients 5 years of age and older), is one component of a comprehensive surveillance program. It is not intended to diagnose infection nor to guide or monitor treatment. Performed at [facility], [place] [address] [place], [place] [address]          Radiology Studies: No results found.      Scheduled Meds: . apixaban  2.5 mg Oral BID  . cholecalciferol  2,000 Units Oral QHS  . docusate sodium  100 mg Oral BID  . feeding supplement (ENSURE ENLIVE)  237 mL Oral BID BM  . polyethylene glycol  17 g Oral BID  . senna-docusate  1 tablet Oral BID  . tamsulosin  0.4 mg Oral QPC breakfast  . vitamin B-12  100 mcg Oral Daily   Continuous Infusions: . lactated ringers 10 mL/hr at 02/01/18 0844     LOS: 5 days    Time spent: 35 minutes,     Elmarie Shiley, MD [facility] Pager [phone]  If 7PM-7AM, please contact night-coverage [email or web address] Password [facility] 02/03/2018, 2:39 PM

## 2018-02-03 NOTE — Progress Notes (Signed)
Physical Therapy Treatment Patient Details Name: Jeffrey Hardy MRN: [id number] DOB: [birth year] Today's Date: 02/03/2018    History of Present Illness 82yo male who fell and hit his R hip, found to have femoral neck fracture. S/p Rt anterior THA on 02/01/18. PMH anxiety, renal CA, CVA, syncope, HTN, A-fib, L THA june 2019, hx wrist fx     PT Comments    Pt HOH and frustrated with incorrect breakfast order this morning. Has not received pain meds and reports no pain. Pt generally self-limiting with mobility fearful of his body not holding him and falling again. Pt educated that his lack of confidence is his largest barrier but was able to progress mobility this session. Pt educated for HEP and encouraged mobility with nursing staff. Pt with slow progress and feel ST-SNF would best meet his needs at this time to return home with wife and stairs to enter.    Follow Up Recommendations  SNF;Supervision/Assistance - 24 hour     Equipment Recommendations  Rolling walker with 5" wheels;3in1 (PT)    Recommendations for Other Services       Precautions / Restrictions Restrictions RLE Weight Bearing: Weight bearing as tolerated    Mobility  Bed Mobility Overal bed mobility: Needs Assistance Bed Mobility: Supine to Sit     Supine to sit: Min assist;HOB elevated     General bed mobility comments: increased time with assist to move RLE, use of rails, HOB 30 degrees and sequential cues  Transfers Overall transfer level: Needs assistance   Transfers: Sit to/from Stand Sit to Stand: Mod assist;From elevated surface         General transfer comment: mod assist with cues, increased time and assist for anterior translation and rise from elevated bed. Pt with very limited initiation to rise presumed due to fear  Ambulation/Gait Ambulation/Gait assistance: Min assist Gait Distance (Feet): 30 Feet Assistive device: Rolling walker (2 wheeled) Gait Pattern/deviations: Step-through  pattern;Decreased stride length;Trunk flexed;Narrow base of support   Gait velocity interpretation: <1.31 ft/sec, indicative of household ambulator General Gait Details: cues for posture, position in RW, safety and increased distance. pt required max encouragement to advance distance   Stairs             Wheelchair Mobility    Modified Rankin (Stroke Patients Only)       Balance Overall balance assessment: Needs assistance   Sitting balance-Leahy Scale: Good     Standing balance support: Bilateral upper extremity supported;During functional activity Standing balance-Leahy Scale: Poor                              Cognition Arousal/Alertness: Awake/alert Behavior During Therapy: WFL for tasks assessed/performed Overall Cognitive Status: Within Functional Limits for tasks assessed                                 General Comments: pt self-limiting and fearful, benefits from encouragement and reassurance      Exercises General Exercises - Lower Extremity Long Arc Quad: AAROM;10 reps;Seated;Right Hip Flexion/Marching: AROM;10 reps;Seated;Right    General Comments        Pertinent Vitals/Pain Pain Assessment: No/denies pain Pain Location: R LE  Pain Descriptors / Indicators: Aching;Sore Pain Intervention(s): Limited activity within patient's tolerance;Monitored during session;Repositioned    Home Living  Prior Function            PT Goals (current goals can now be found in the care plan section) Progress towards PT goals: Progressing toward goals    Frequency    Min 5X/week      PT Plan Discharge plan needs to be updated;Frequency needs to be updated    Co-evaluation              AM-PAC PT "6 Clicks" Daily Activity  Outcome Measure  Difficulty turning over in bed (including adjusting bedclothes, sheets and blankets)?: Unable Difficulty moving from lying on back to sitting on the side  of the bed? : Unable Difficulty sitting down on and standing up from a chair with arms (e.g., wheelchair, bedside commode, etc,.)?: Unable Help needed moving to and from a bed to chair (including a wheelchair)?: A Lot Help needed walking in hospital room?: A Little Help needed climbing 3-5 steps with a railing? : A Lot 6 Click Score: 10    End of Session Equipment Utilized During Treatment: Gait belt Activity Tolerance: Patient tolerated treatment well Patient left: in chair;with call bell/phone within reach Nurse Communication: Mobility status PT Visit Diagnosis: Difficulty in walking, not elsewhere classified (R26.2);Unsteadiness on feet (R26.81);Other abnormalities of gait and mobility (R26.89);History of falling (Z91.81);Muscle weakness (generalized) (M62.81)     Time: 7672-0947 PT Time Calculation (min) (ACUTE ONLY): 30 min  Charges:  $Gait Training: 8-22 mins $Therapeutic Activity: 8-22 mins                     [place], PT Acute Rehabilitation Services Pager: [phone] Office: [place] 02/03/2018, 9:19 AM

## 2018-02-03 NOTE — Progress Notes (Signed)
    Subjective:  Patient reports pain as mild to moderate.  Denies N/V/CP/SOB.  No c/o.  Objective:   VITALS:   Vitals:   02/02/18 1235 02/02/18 1625 02/02/18 2038 02/03/18 0508  BP: 132/77 112/62 124/75 114/71  Pulse: 90 82 89 78  Resp: 16 18 20 20   Temp: 98.9 F (37.2 C) (!) 96.8 F (36 C) 98.2 F (36.8 C) 98.6 F (37 C)  TempSrc: Oral Oral Oral Oral  SpO2: 93% 95% 96% 97%  Weight:      Height:        NAD ABD soft Intact pulses distally Incision: dressing C/D/I RLE foot drop - stable from preop   Lab Results  Component Value Date   WBC 6.3 02/03/2018   HGB 9.1 (L) 02/03/2018   HCT 27.6 (L) 02/03/2018   MCV 92.9 02/03/2018   PLT 97 (L) 02/03/2018   BMET    Component Value Date/Time   NA 139 02/03/2018 0420   K 4.2 02/03/2018 0420   CL 108 02/03/2018 0420   CO2 24 02/03/2018 0420   GLUCOSE 102 (H) 02/03/2018 0420   BUN 36 (H) 02/03/2018 0420   CREATININE 2.01 (H) 02/03/2018 0420   CALCIUM 8.2 (L) 02/03/2018 0420   GFRNONAA 30 (L) 02/03/2018 0420   GFRAA 35 (L) 02/03/2018 0420     Assessment/Plan: 2 Days Post-Op   Principal Problem:   Closed right hip fracture, initial encounter ([place]) Active Problems:   Thrombocytopenia (HCC)   Renal cell cancer (HCC)   CKD (chronic kidney disease) stage 3, GFR 30-59 ml/min (HCC)   PAF (paroxysmal atrial fibrillation) ([place])   Closed displaced fracture of right femoral neck ([place])   WBAT with walker DVT ppx: eliquis, SCDs, TEDS PO pain control PT/OT: AFO when OOB Dispo: D/C home with HHPT when medically ready, likely tomorrow   Hilton Cork Selina Tapper 02/03/2018, 1:35 PM   Rod Can, MD Cell: [phone] [facility]  [facility] [address] [place], [place],  [address] Phone: [phone] [email or web address] Facebook  [employer]

## 2018-02-04 ENCOUNTER — Encounter: Payer: Self-pay | Admitting: *Deleted

## 2018-02-04 DIAGNOSIS — Z96641 Presence of right artificial hip joint: Secondary | ICD-10-CM

## 2018-02-04 LAB — CBC
HCT: 27.5 % — ABNORMAL LOW (ref 39.0–52.0)
Hemoglobin: 9.1 g/dL — ABNORMAL LOW (ref 13.0–17.0)
MCH: 30.5 pg (ref 26.0–34.0)
MCHC: 33.1 g/dL (ref 30.0–36.0)
MCV: 92.3 fL (ref 80.0–100.0)
Platelets: 109 10*3/uL — ABNORMAL LOW (ref 150–400)
RBC: 2.98 MIL/uL — ABNORMAL LOW (ref 4.22–5.81)
RDW: 14.8 % (ref 11.5–15.5)
WBC: 6.5 10*3/uL (ref 4.0–10.5)
nRBC: 0 % (ref 0.0–0.2)

## 2018-02-04 LAB — BASIC METABOLIC PANEL
Anion gap: 8 (ref 5–15)
BUN: 31 mg/dL — ABNORMAL HIGH (ref 8–23)
CO2: 23 mmol/L (ref 22–32)
Calcium: 8.3 mg/dL — ABNORMAL LOW (ref 8.9–10.3)
Chloride: 106 mmol/L (ref 98–111)
Creatinine, Ser: 1.74 mg/dL — ABNORMAL HIGH (ref 0.61–1.24)
GFR calc Af Amer: 41 mL/min — ABNORMAL LOW (ref >60)
GFR calc non Af Amer: 36 mL/min — ABNORMAL LOW (ref >60)
Glucose, Bld: 112 mg/dL — ABNORMAL HIGH (ref 70–99)
Potassium: 4 mmol/L (ref 3.5–5.1)
Sodium: 137 mmol/L (ref 135–145)

## 2018-02-04 MED ORDER — SENNOSIDES-DOCUSATE SODIUM 8.6-50 MG PO TABS: 1.0000 | ORAL_TABLET | Freq: Every evening | ORAL | 0 refills | Status: DC | PRN

## 2018-02-04 MED ORDER — ENSURE ENLIVE PO LIQD: 237.0000 mL | Freq: Two times a day (BID) | ORAL | Status: DC

## 2018-02-04 MED ORDER — POLYETHYLENE GLYCOL 3350 17 G PO PACK: 17.0000 g | PACK | Freq: Every day | ORAL | 0 refills | Status: DC

## 2018-02-04 NOTE — Discharge Summary (Signed)
Physician Discharge Summary  Jeffrey Hardy:[id number] DOB: 09/04/1935  PCP: Biagio Borg, MD  Admit date: 01/29/2018 Discharge date: 02/04/2018  Recommendations for Outpatient Follow-up:  1. Dr. Cathlean Cower, PCP in 1 week with repeat labs (CBC & BMP). 2. Dr. Rod Can, Orthopedics in 2 weeks for postop follow-up.    Home Health: PT & OT.  Patient and spouse declined SNF. Equipment/Devices: None.  Patient has rolling walker, 3 in 1 and tub seat from previous surgery.  Discharge Condition: Improved and stable. CODE STATUS: Full Diet recommendation: Heart healthy diet.  Discharge Diagnoses:  Principal Problem:   Closed right hip fracture, initial encounter [facility]) Active Problems:   Thrombocytopenia (HCC)   Renal cell cancer (HCC)   CKD (chronic kidney disease) stage 3, GFR 30-59 ml/min (HCC)   PAF (paroxysmal atrial fibrillation) (HCC)   Closed displaced fracture of right femoral neck (HCC)   Brief Summary: 82 year old married male with PMH of PAF on Eliquis, chronic mild thrombocytopenia, CVA, stage III chronic kidney disease, presented to ED after a mechanical fall and severe right hip pain.  He had acute right subcapital femoral neck fracture.  Orthopedics was consulted, Eliquis was held and allowed to washout and then he underwent surgery on 01/29/2018.  Assessment and plan:  1. Closed right hip fracture: Sustained status post mechanical fall.  Patient was on Eliquis prior to admission and his last dose prior to admission was on 01/29/2018.  Eliquis was held and time given for washout.  Orthopedics was consulted and he underwent right total hip arthroplasty on 02/01/2018.  As per orthopedics, weightbearing as tolerated with walker, continue Eliquis for DVT prophylaxis and outpatient follow-up with them in 2 weeks.  PT recommended SNF but patient and spouse declined repeatedly and wished to return home with home health services. 2. Postop acute blood loss anemia: Patient  hemoglobin dropped from 13-14 g range to 9 g.  EBL during surgery: 750 mL.  He was symptomatic of lightheadedness and nausea.  He was transfused 1 unit of PRBC on 10/28.  Hemoglobin has remained stable in the 9.1 g range and he is asymptomatic of same. 3. Paroxysmal A. fib: Patient has prior history of stroke.  Eliquis was held and he was bridged with IV heparin which was held several hours prior to surgery.  Eliquis was resumed postop. 4. Stage III chronic kidney disease: Baseline creatinine reportedly in the 2 range.  Currently creatinine improved to 1.7.  Follow BMP periodically as outpatient. 5. Thrombocytopenia: Chronic, intermittent and mild.  No bleeding reported.  Improved.  Follow CBC periodically as outpatient. 6. Hiccups: Continue PTA as needed baclofen. 7. Cognitive impairment: Reportedly ongoing for the last year or so.  Suspect dementia. Low normal B12.  TSH normal.  Outpatient follow-up and evaluation with PCP.   Consultations:  Orthopedics  Procedures:  As above   Discharge Instructions  Discharge Instructions    Call MD for:  difficulty breathing, headache or visual disturbances   Complete by:  As directed    Call MD for:  extreme fatigue   Complete by:  As directed    Call MD for:  persistant dizziness or light-headedness   Complete by:  As directed    Call MD for:  persistant nausea and vomiting   Complete by:  As directed    Call MD for:  redness, tenderness, or signs of infection (pain, swelling, redness, odor or green/yellow discharge around incision site)   Complete by:  As directed  Call MD for:  severe uncontrolled pain   Complete by:  As directed    Call MD for:  temperature >100.4   Complete by:  As directed    Diet - low sodium heart healthy   Complete by:  As directed    Increase activity slowly   Complete by:  As directed        Medication List    TAKE these medications   baclofen 10 MG tablet Commonly known as:  LIORESAL Take 10 mg by  mouth daily as needed (hiccups).   cholecalciferol 1000 units tablet Commonly known as:  VITAMIN D Take 5,000 Units by mouth at bedtime.   ELIQUIS 5 MG Tabs tablet Generic drug:  apixaban Take 5 mg by mouth 2 (two) times daily. What changed:  Another medication with the same name was removed. Continue taking this medication, and follow the directions you see here.   feeding supplement (ENSURE ENLIVE) Liqd Take 237 mLs by mouth 2 (two) times daily between meals.   ferrous sulfate 325 (65 FE) MG tablet Take 325 mg by mouth daily with breakfast.   HYDROcodone-acetaminophen 5-325 MG tablet Commonly known as:  NORCO/VICODIN Take 1-2 tablets by mouth every 4 (four) hours as needed for moderate pain (pain score 4-6).   polyethylene glycol packet Commonly known as:  MIRALAX / GLYCOLAX Take 17 g by mouth daily.   senna-docusate 8.6-50 MG tablet Commonly known as:  Senokot-S Take 1 tablet by mouth at bedtime as needed for mild constipation or moderate constipation.   tamsulosin 0.4 MG Caps capsule Commonly known as:  FLOMAX Take 1 capsule (0.4 mg total) by mouth daily after breakfast.   Trospium Chloride 60 MG Cp24 Take 60 mg by mouth daily.      Follow-up Information    Swin[place], Aaron Edelman, MD. Schedule an appointment as soon as possible for a visit in 2 weeks.   Specialty:  Orthopedic Surgery Why:  For wound re-check Contact information: [address] [place] [address] [phone]        Health, [facility] Follow up.   Specialty:  [place] Why:  A representative from [facility] will contact you to arrange start date and time for your therapy. Contact information: [place] [address] [phone]        Biagio Borg, MD. Schedule an appointment as soon as possible for a visit in 1 week(s).   Specialties:  Internal Medicine, Radiology Why:  To be seen with repeat labs (CBC & BMP). Contact  information: [place] [address] [phone]          Allergies  Allergen Reactions  . Acyclovir And Related Other (See Comments)    Unsure of what happens      Procedures/Studies:  Pelvis Portable  Result Date: 02/01/2018 CLINICAL DATA:  Status post right hip replacement. EXAM: PORTABLE PELVIS 1-2 VIEWS COMPARISON:  C-arm images obtained earlier today. FINDINGS: Bilateral hip prostheses in satisfactory position and alignment. No fracture or dislocation. Lower lumbar spine degenerative changes. IMPRESSION: Satisfactory postoperative appearance of the right hip prosthesis. Electronically Signed   By: Claudie Revering M.D.   On: 02/01/2018 15:31   Dg Chest Port 1 View  Result Date: 01/29/2018 CLINICAL DATA:  82 y/o  M; left hip fracture today. Former smoker. EXAM: PORTABLE CHEST 1 VIEW COMPARISON:  10/23/2017 chest radiograph. FINDINGS: The heart size and mediastinal contours are within normal limits. Loop  recorder device noted. Both lungs are clear. The visualized skeletal structures are unremarkable. IMPRESSION: No active disease. Electronically Signed   By: Kristine Garbe M.D.   On: 01/29/2018 20:04   Dg C-arm 1-60 Min  Result Date: 02/01/2018 CLINICAL DATA:  Anterior approach right total hip arthroplasty EXAM: DG C-ARM 61-120 MIN; OPERATIVE RIGHT HIP WITH PELVIS FLUOROSCOPY TIME:  34 seconds COMPARISON:  Right hip radiographs-01/29/2018 FINDINGS: Two spot intraoperative fluoroscopic images of the right hip and lower pelvis are provided for review. Images demonstrate the sequela of interval right total hip replacement with cerclage fixation of the proximal femur. Alignment appears anatomic given AP projection. There is a minimal amount of subcutaneous emphysema about the operative site. No radiopaque foreign body. Stable sequela of left total hip replacement, incompletely evaluated. IMPRESSION: Post right total hip replacement with cerclage fixation of  the proximal femur as detailed above. Electronically Signed   By: Sandi Mariscal M.D.   On: 02/01/2018 14:23   Dg C-arm 1-60 Min  Result Date: 02/01/2018 CLINICAL DATA:  Anterior approach right total hip arthroplasty EXAM: DG C-ARM 61-120 MIN; OPERATIVE RIGHT HIP WITH PELVIS FLUOROSCOPY TIME:  34 seconds COMPARISON:  Right hip radiographs-01/29/2018 FINDINGS: Two spot intraoperative fluoroscopic images of the right hip and lower pelvis are provided for review. Images demonstrate the sequela of interval right total hip replacement with cerclage fixation of the proximal femur. Alignment appears anatomic given AP projection. There is a minimal amount of subcutaneous emphysema about the operative site. No radiopaque foreign body. Stable sequela of left total hip replacement, incompletely evaluated. IMPRESSION: Post right total hip replacement with cerclage fixation of the proximal femur as detailed above. Electronically Signed   By: Sandi Mariscal M.D.   On: 02/01/2018 14:23   Dg Hip Operative Unilat W Or W/o Pelvis Right  Result Date: 02/01/2018 CLINICAL DATA:  Anterior approach right total hip arthroplasty EXAM: DG C-ARM 61-120 MIN; OPERATIVE RIGHT HIP WITH PELVIS FLUOROSCOPY TIME:  34 seconds COMPARISON:  Right hip radiographs-01/29/2018 FINDINGS: Two spot intraoperative fluoroscopic images of the right hip and lower pelvis are provided for review. Images demonstrate the sequela of interval right total hip replacement with cerclage fixation of the proximal femur. Alignment appears anatomic given AP projection. There is a minimal amount of subcutaneous emphysema about the operative site. No radiopaque foreign body. Stable sequela of left total hip replacement, incompletely evaluated. IMPRESSION: Post right total hip replacement with cerclage fixation of the proximal femur as detailed above. Electronically Signed   By: Sandi Mariscal M.D.   On: 02/01/2018 14:23   Dg Hip Unilat W Or Wo Pelvis 2-3 Views  Right  Result Date: 01/29/2018 CLINICAL DATA:  Fall with hip pain EXAM: DG HIP (WITH OR WITHOUT PELVIS) 2-3V RIGHT COMPARISON:  08/14/2017 FINDINGS: Status post left hip replacement with normal alignment and intact hardware. Pubic symphysis is intact. Rami are without acute displaced fracture. Acute mildly displaced right subcapital femoral neck fracture. No femoral head dislocation. IMPRESSION: 1. Acute right subcapital femoral neck fracture 2. Prior left hip replacement with normal alignment. Electronically Signed   By: Donavan Foil M.D.   On: 01/29/2018 19:19      Subjective: Patient reports that he feels much better.  Anxious to return home.  Repeatedly declines SNF despite recommendation by multiple medical personnel.  Reports mild postop right hip pain, worse with weightbearing but better after pain medications.  Reports having a large BM this morning.  Tolerating diet.  No dizziness, lightheadedness, chest pain, dyspnea  or palpitations.  As per RN, no acute issues noted.  Discharge Exam:  Vitals:   02/03/18 1541 02/03/18 2129 02/04/18 0448 02/04/18 0826  BP: 126/81 94/82 117/73 129/64  Pulse: 76 82 75 76  Resp: 18 17 15 16   Temp:  98.5 F (36.9 C) 98.3 F (36.8 C) 98.2 F (36.8 C)  TempSrc:  Oral Oral Oral  SpO2: 100% 96% 97% 96%  Weight:      Height:        General: Pleasant elderly male, moderately built and nourished lying comfortably propped up in bed, getting ready to work with PT this morning. Cardiovascular: S1 & S2 heard, RRR, S1/S2 +. No murmurs, rubs, gallops or clicks. No JVD or pedal edema. Respiratory: Clear to auscultation without wheezing, rhonchi or crackles. No increased work of breathing. Abdominal:  Non distended, non tender & soft. No organomegaly or masses appreciated. Normal bowel sounds heard. CNS: Alert and oriented. No focal deficits. Extremities: no edema, no cyanosis.  Right hip surgical site dressing clean and dry.  No acute findings  noted.    The results of significant diagnostics from this hospitalization (including imaging, microbiology, ancillary and laboratory) are listed below for reference.     Microbiology: Recent Results (from the past 240 hour(s))  Surgical PCR screen     Status: None   Collection Time: 01/30/18  2:17 AM  Result Value Ref Range Status   MRSA, PCR NEGATIVE NEGATIVE Final   Staphylococcus aureus NEGATIVE NEGATIVE Final    Comment: (NOTE) The Xpert SA Assay (FDA approved for NASAL specimens in patients 43 years of age and older), is one component of a comprehensive surveillance program. It is not intended to diagnose infection nor to guide or monitor treatment. Performed at [facility], [place] [address] [place], [place] [address]      Labs: CBC: Recent Labs  Lab 01/29/18 1947  02/01/18 0400 02/01/18 1450 02/02/18 0445 02/03/18 0420 02/04/18 0332  WBC 8.5   < > 6.7 9.1 10.4 6.3 6.5  NEUTROABS 7.3  --   --   --   --   --   --   HGB 14.4   < > 13.1 10.3* 9.1* 9.1* 9.1*  HCT 43.6   < > 40.3 31.8* 28.2* 27.6* 27.5*  MCV 94.6   < > 92.9 95.2 94.0 92.9 92.3  PLT 132*   < > 95* 91* 99* 97* 109*   < > = values in this interval not displayed.   Basic Metabolic Panel: Recent Labs  Lab 01/30/18 0310 01/31/18 0734 02/02/18 0445 02/03/18 0420 02/04/18 0332  NA 140 139 137 139 137  K 4.2 3.9 4.4 4.2 4.0  CL 108 109 107 108 106  CO2 24 21* 22 24 23   GLUCOSE 119* 104* 128* 102* 112*  BUN 29* 24* 29* 36* 31*  CREATININE 1.94* 1.86* 1.93* 2.01* 1.74*  CALCIUM 8.9 8.6* 8.1* 8.2* 8.3*    Thyroid function studies Recent Labs    02/02/18 0445  TSH 1.063   Anemia work up Recent Labs    02/02/18 [place] 244    I discussed in detail with patient's spouse, updated care and answered questions.   Time coordinating discharge: Over 30 minutes  SIGNED:  Vernell Leep, MD, FACP, [facility]. [facility] Pager [phone]  If 7PM-7AM, please contact  night-coverage [email or web address] Password [email or web address] 02/04/2018, 1:59 PM

## 2018-02-04 NOTE — Progress Notes (Signed)
Physical Therapy Treatment Patient Details Name: Jeffrey Hardy MRN: [id number] DOB: 03-19-1936 Today's Date: 02/04/2018    History of Present Illness 82yo male who fell and hit his R hip, found to have femoral neck fracture. S/p Rt anterior THA on 02/01/18. PMH anxiety, renal CA, CVA, syncope, HTN, A-fib, L THA june 2019, hx wrist fx     PT Comments    Patient seen for mobility progression. Pt is making progress toward PT goals and able to ambulate ~70ft total during session with min A and RW. Pt requires min A to ascend and mod A to descend 2 steps simulating home entrance. L knee instability noted when descending steps and therapist assisted to stabilize for safety. Continue to recommend ST-SNF given pt's mobility level and history of falls although pt and pt's wife would prefer to d/c home. In the case that he discharges home he will need 24 hour assistance and HHPT for further skilled PT services. Wife reports that she has assistance starting Saturday. Continue to progress as tolerated.      Follow Up Recommendations  SNF;Supervision/Assistance - 24 hour     Equipment Recommendations  Rolling walker with 5" wheels;3in1 (PT)    Recommendations for Other Services       Precautions / Restrictions Precautions Precautions: Fall;Other (comment) Precaution Comments: watch HR and SpO2  Restrictions Weight Bearing Restrictions: Yes RLE Weight Bearing: Weight bearing as tolerated    Mobility  Bed Mobility Overal bed mobility: Needs Assistance Bed Mobility: Supine to Sit     Supine to sit: Min assist;HOB elevated     General bed mobility comments: cues for sequencing and assist to bring R LE to EOB   Transfers Overall transfer level: Needs assistance Equipment used: Rolling walker (2 wheeled) Transfers: Sit to/from Stand Sit to Stand: Min assist         General transfer comment: assist to power up into standing and to gain balance  Ambulation/Gait Ambulation/Gait  assistance: Min assist Gait Distance (Feet): 80 Feet Assistive device: Rolling walker (2 wheeled) Gait Pattern/deviations: Step-through pattern;Trunk flexed;Decreased step length - right;Decreased step length - left(R AFO ) Gait velocity: decreased    General Gait Details: cues for posture, sequencing, increased bilat step lengths, safe use of AD   Stairs Stairs: Yes Stairs assistance: Min assist;Mod assist Stair Management: One rail Right;Step to pattern;Sideways Number of Stairs: 2 General stair comments: cues for sequencing and technique; min A to ascend and mod A to descend due to L knee instability; therapist stabilized L knee to descend for safety   Wheelchair Mobility    Modified Rankin (Stroke Patients Only)       Balance Overall balance assessment: Needs assistance Sitting-balance support: Bilateral upper extremity supported;Feet supported Sitting balance-Leahy Scale: Good     Standing balance support: Bilateral upper extremity supported;During functional activity Standing balance-Leahy Scale: Poor                              Cognition Arousal/Alertness: Awake/alert Behavior During Therapy: WFL for tasks assessed/performed Overall Cognitive Status: Within Functional Limits for tasks assessed                                 General Comments: appears a little confused at times       Exercises      General Comments General comments (skin integrity, edema, etc.): wife present  during session; SOB with mobility      Pertinent Vitals/Pain Pain Assessment: Faces Faces Pain Scale: Hurts little more Pain Location: R LE  Pain Descriptors / Indicators: Sore Pain Intervention(s): Limited activity within patient's tolerance;Monitored during session;Premedicated before session;Repositioned    Home Living                      Prior Function            PT Goals (current goals can now be found in the care plan section) Acute  Rehab PT Goals Patient Stated Goal: to feel better  Progress towards PT goals: Progressing toward goals    Frequency    Min 5X/week      PT Plan Current plan remains appropriate    Co-evaluation              AM-PAC PT "6 Clicks" Daily Activity  Outcome Measure  Difficulty turning over in bed (including adjusting bedclothes, sheets and blankets)?: Unable Difficulty moving from lying on back to sitting on the side of the bed? : Unable Difficulty sitting down on and standing up from a chair with arms (e.g., wheelchair, bedside commode, etc,.)?: Unable Help needed moving to and from a bed to chair (including a wheelchair)?: A Little Help needed walking in hospital room?: A Little Help needed climbing 3-5 steps with a railing? : A Lot 6 Click Score: 11    End of Session Equipment Utilized During Treatment: Gait belt Activity Tolerance: Patient tolerated treatment well Patient left: in chair;with call bell/phone within reach;with family/visitor present Nurse Communication: Mobility status PT Visit Diagnosis: Difficulty in walking, not elsewhere classified (R26.2);Unsteadiness on feet (R26.81);Other abnormalities of gait and mobility (R26.89);History of falling (Z91.81);Muscle weakness (generalized) (M62.81)     Time: 1030-1110 PT Time Calculation (min) (ACUTE ONLY): 40 min  Charges:  $Gait Training: 23-37 mins $Therapeutic Activity: 8-22 mins                     Earney Navy, PTA Acute Rehabilitation Services Pager: [phone] Office: [phone]     Darliss Cheney 02/04/2018, 11:24 AM

## 2018-02-04 NOTE — Care Management Note (Signed)
Case Management Note  Patient Details  Name: Jeffrey Hardy MRN: [id number] Date of Birth: Aug 11, 1935  Subjective/Objective:  82 yr old gentleman admitted with right hip fracture after a fall. Patient underwent a right total hip arthroplasty 02/01/18.                  Action/Plan: Case manager spoke with patient and wife concerning discharge plan and DME. Therapy had requested patient go to SNF for shortterm rehab, patient's wife said he will do better at home in familiar surroundings and with her present, due to his memory issues. She has managed him at home before. Choice was offered, for Home Health agency. Mrs. Bocek says they have used [facility] in the past and wish to do so now. CM also provided her with Private Agency list, for additional services. Patient's daughter will be in town by the wekend.  `    Expected Discharge Date:    02/05/18              Expected Discharge Plan:  [place]  In-House Referral:  NA  Discharge planning Services  CM Consult  Post Acute Care Choice:  Home Health Choice offered to:  Patient, Spouse  DME Arranged:  N/A(Has RW, 3in1, tub seat from previous surgery) DME Agency:  NA  HH Arranged:  PT, OT HH Agency:  [facility]  Status of Service:  Completed, signed off  If discussed at [place] of Stay Meetings, dates discussed:    Additional Comments:  Ninfa Meeker, RN 02/04/2018, 1:20 PM

## 2018-02-04 NOTE — Progress Notes (Signed)
Patient's wife has decided to take the patient to their home and have assistance at the home.  Discharge instructions, written and verbal given to the patient.  They both verbalize understanding those instructions.  The patient will be discharged to the home via wheel chair and acknowledges the importance of follow up appointment.

## 2018-02-04 NOTE — Consult Note (Addendum)
   [facility] CM Inpatient Consult   02/04/2018  Jeffrey Hardy 08/17/1935 [id number]    Patient screened for potential [facility] Care Management services due to unplanned readmission risk score of 24% (high).  Went to bedside to speak with [name] and his wife [name] about [place] Management program services. They are agreeable and [facility] Care Management written consent obtained. [facility] folder provided.  Explained [facility] Care Management will not interfere or replace services provided by home health. [name] states patient will have [place] as well. [name] states "recommendations were for rehab but I am taking him home. We have done this before." [name] states she will have private duty aide services as well.   Confirmed Primary Care MD is Dr. Jenny Reichmann ( Shelba Flake listed as doing transition of care call).  [name] (wife) states she should be contacted for post hospital discharge calls at [phone]. She states [name] is hard of hearing and it will be best to contact her. [name] is agreeable to this plan.   There are no voiced transportation or medication needs. [name] denies the need for [facility] LCSW referral at this time. States " I have it handled."  Will assign to [place] for complex case management. [name] are agreeable to this.  Made inpatient RNCM aware [facility] will follow post discharge.    Marthenia Rolling, MSN-Ed, RN,BSN [facility] Liaison [phone]

## 2018-02-05 ENCOUNTER — Other Ambulatory Visit: Payer: Self-pay | Admitting: *Deleted

## 2018-02-05 NOTE — Patient Outreach (Signed)
[facility]) Care Management  02/05/2018  Jeffrey Hardy May 11, 1935 [id number]    Telephone Assessment  RN attempted outreach call today however unsuccessful but was able to leave a HIPAA approved voice message requesting a call back. Will scheduled another outreach call next week and send letter.  Raina Mina, RN Care Management Coordinator [place] Office [phone]

## 2018-02-06 ENCOUNTER — Observation Stay (HOSPITAL_COMMUNITY): Payer: PPO

## 2018-02-06 ENCOUNTER — Inpatient Hospital Stay (HOSPITAL_COMMUNITY)
Admission: EM | Admit: 2018-02-06 | Discharge: 2018-02-11 | DRG: 312 | Disposition: A | Discharge status: Skilled Nursing Facility | Payer: PPO | Attending: Internal Medicine | Admitting: Internal Medicine

## 2018-02-06 ENCOUNTER — Other Ambulatory Visit: Payer: Self-pay

## 2018-02-06 ENCOUNTER — Emergency Department (HOSPITAL_BASED_OUTPATIENT_CLINIC_OR_DEPARTMENT_OTHER): Payer: PPO

## 2018-02-06 ENCOUNTER — Encounter (HOSPITAL_COMMUNITY): Payer: Self-pay

## 2018-02-06 ENCOUNTER — Telehealth: Payer: Self-pay | Admitting: *Deleted

## 2018-02-06 DIAGNOSIS — I69351 Hemiplegia and hemiparesis following cerebral infarction affecting right dominant side: Secondary | ICD-10-CM

## 2018-02-06 DIAGNOSIS — M7989 Other specified soft tissue disorders: Secondary | ICD-10-CM | POA: Diagnosis not present

## 2018-02-06 DIAGNOSIS — I129 Hypertensive chronic kidney disease with stage 1 through stage 4 chronic kidney disease, or unspecified chronic kidney disease: Secondary | ICD-10-CM | POA: Diagnosis present

## 2018-02-06 DIAGNOSIS — S72001D Fracture of unspecified part of neck of right femur, subsequent encounter for closed fracture with routine healing: Secondary | ICD-10-CM | POA: Diagnosis not present

## 2018-02-06 DIAGNOSIS — Z7901 Long term (current) use of anticoagulants: Secondary | ICD-10-CM | POA: Diagnosis not present

## 2018-02-06 DIAGNOSIS — I480 Paroxysmal atrial fibrillation: Secondary | ICD-10-CM | POA: Diagnosis present

## 2018-02-06 DIAGNOSIS — N400 Enlarged prostate without lower urinary tract symptoms: Secondary | ICD-10-CM | POA: Diagnosis present

## 2018-02-06 DIAGNOSIS — Z9181 History of falling: Secondary | ICD-10-CM | POA: Diagnosis not present

## 2018-02-06 DIAGNOSIS — N183 Chronic kidney disease, stage 3 (moderate): Secondary | ICD-10-CM | POA: Diagnosis present

## 2018-02-06 DIAGNOSIS — Z808 Family history of malignant neoplasm of other organs or systems: Secondary | ICD-10-CM | POA: Diagnosis not present

## 2018-02-06 DIAGNOSIS — R52 Pain, unspecified: Secondary | ICD-10-CM | POA: Diagnosis not present

## 2018-02-06 DIAGNOSIS — I69398 Other sequelae of cerebral infarction: Secondary | ICD-10-CM | POA: Diagnosis not present

## 2018-02-06 DIAGNOSIS — K219 Gastro-esophageal reflux disease without esophagitis: Secondary | ICD-10-CM | POA: Diagnosis present

## 2018-02-06 DIAGNOSIS — G3184 Mild cognitive impairment, so stated: Secondary | ICD-10-CM | POA: Diagnosis present

## 2018-02-06 DIAGNOSIS — R269 Unspecified abnormalities of gait and mobility: Secondary | ICD-10-CM | POA: Diagnosis not present

## 2018-02-06 DIAGNOSIS — Z96643 Presence of artificial hip joint, bilateral: Secondary | ICD-10-CM | POA: Diagnosis present

## 2018-02-06 DIAGNOSIS — I4891 Unspecified atrial fibrillation: Secondary | ICD-10-CM | POA: Diagnosis not present

## 2018-02-06 DIAGNOSIS — I951 Orthostatic hypotension: Secondary | ICD-10-CM | POA: Diagnosis present

## 2018-02-06 DIAGNOSIS — Z87891 Personal history of nicotine dependence: Secondary | ICD-10-CM

## 2018-02-06 DIAGNOSIS — Z96641 Presence of right artificial hip joint: Secondary | ICD-10-CM | POA: Diagnosis not present

## 2018-02-06 DIAGNOSIS — R55 Syncope and collapse: Secondary | ICD-10-CM

## 2018-02-06 DIAGNOSIS — Z820 Family history of epilepsy and other diseases of the nervous system: Secondary | ICD-10-CM

## 2018-02-06 DIAGNOSIS — H9190 Unspecified hearing loss, unspecified ear: Secondary | ICD-10-CM | POA: Diagnosis present

## 2018-02-06 DIAGNOSIS — I491 Atrial premature depolarization: Secondary | ICD-10-CM | POA: Diagnosis not present

## 2018-02-06 DIAGNOSIS — I69354 Hemiplegia and hemiparesis following cerebral infarction affecting left non-dominant side: Secondary | ICD-10-CM | POA: Diagnosis not present

## 2018-02-06 DIAGNOSIS — Z85528 Personal history of other malignant neoplasm of kidney: Secondary | ICD-10-CM

## 2018-02-06 DIAGNOSIS — D631 Anemia in chronic kidney disease: Secondary | ICD-10-CM | POA: Diagnosis present

## 2018-02-06 DIAGNOSIS — M6281 Muscle weakness (generalized): Secondary | ICD-10-CM | POA: Diagnosis not present

## 2018-02-06 DIAGNOSIS — E785 Hyperlipidemia, unspecified: Secondary | ICD-10-CM | POA: Diagnosis present

## 2018-02-06 DIAGNOSIS — R2681 Unsteadiness on feet: Secondary | ICD-10-CM | POA: Diagnosis not present

## 2018-02-06 DIAGNOSIS — K5730 Diverticulosis of large intestine without perforation or abscess without bleeding: Secondary | ICD-10-CM | POA: Diagnosis present

## 2018-02-06 DIAGNOSIS — I959 Hypotension, unspecified: Secondary | ICD-10-CM | POA: Diagnosis not present

## 2018-02-06 DIAGNOSIS — Z881 Allergy status to other antibiotic agents status: Secondary | ICD-10-CM | POA: Diagnosis not present

## 2018-02-06 DIAGNOSIS — Z8711 Personal history of peptic ulcer disease: Secondary | ICD-10-CM | POA: Diagnosis not present

## 2018-02-06 DIAGNOSIS — R404 Transient alteration of awareness: Secondary | ICD-10-CM | POA: Diagnosis not present

## 2018-02-06 DIAGNOSIS — E860 Dehydration: Secondary | ICD-10-CM | POA: Diagnosis present

## 2018-02-06 DIAGNOSIS — Q600 Renal agenesis, unilateral: Secondary | ICD-10-CM | POA: Diagnosis not present

## 2018-02-06 DIAGNOSIS — R0902 Hypoxemia: Secondary | ICD-10-CM | POA: Diagnosis not present

## 2018-02-06 DIAGNOSIS — G9389 Other specified disorders of brain: Secondary | ICD-10-CM | POA: Diagnosis not present

## 2018-02-06 DIAGNOSIS — K449 Diaphragmatic hernia without obstruction or gangrene: Secondary | ICD-10-CM | POA: Diagnosis present

## 2018-02-06 LAB — CBC WITH DIFFERENTIAL/PLATELET
Abs Immature Granulocytes: 0.1 10*3/uL — ABNORMAL HIGH (ref 0.00–0.07)
Basophils Absolute: 0 10*3/uL (ref 0.0–0.1)
Basophils Relative: 0 %
Eosinophils Absolute: 0 10*3/uL (ref 0.0–0.5)
Eosinophils Relative: 0 %
HCT: 30.3 % — ABNORMAL LOW (ref 39.0–52.0)
Hemoglobin: 9.8 g/dL — ABNORMAL LOW (ref 13.0–17.0)
Immature Granulocytes: 1 %
Lymphocytes Relative: 6 %
Lymphs Abs: 0.5 10*3/uL — ABNORMAL LOW (ref 0.7–4.0)
MCH: 31.5 pg (ref 26.0–34.0)
MCHC: 32.3 g/dL (ref 30.0–36.0)
MCV: 97.4 fL (ref 80.0–100.0)
Monocytes Absolute: 0.5 10*3/uL (ref 0.1–1.0)
Monocytes Relative: 6 %
Neutro Abs: 7.4 10*3/uL (ref 1.7–7.7)
Neutrophils Relative %: 87 %
Platelets: 157 10*3/uL (ref 150–400)
RBC: 3.11 MIL/uL — ABNORMAL LOW (ref 4.22–5.81)
RDW: 14.7 % (ref 11.5–15.5)
WBC: 8.6 10*3/uL (ref 4.0–10.5)
nRBC: 0 % (ref 0.0–0.2)

## 2018-02-06 LAB — COMPREHENSIVE METABOLIC PANEL
ALT: 56 U/L — ABNORMAL HIGH (ref 0–44)
AST: 40 U/L (ref 15–41)
Albumin: 3.3 g/dL — ABNORMAL LOW (ref 3.5–5.0)
Alkaline Phosphatase: 54 U/L (ref 38–126)
Anion gap: 6 (ref 5–15)
BUN: 36 mg/dL — ABNORMAL HIGH (ref 8–23)
CO2: 26 mmol/L (ref 22–32)
Calcium: 8.6 mg/dL — ABNORMAL LOW (ref 8.9–10.3)
Chloride: 106 mmol/L (ref 98–111)
Creatinine, Ser: 1.91 mg/dL — ABNORMAL HIGH (ref 0.61–1.24)
GFR calc Af Amer: 37 mL/min — ABNORMAL LOW (ref >60)
GFR calc non Af Amer: 32 mL/min — ABNORMAL LOW (ref >60)
Glucose, Bld: 148 mg/dL — ABNORMAL HIGH (ref 70–99)
Potassium: 4.6 mmol/L (ref 3.5–5.1)
Sodium: 138 mmol/L (ref 135–145)
Total Bilirubin: 0.6 mg/dL (ref 0.3–1.2)
Total Protein: 6 g/dL — ABNORMAL LOW (ref 6.5–8.1)

## 2018-02-06 LAB — URINALYSIS, ROUTINE W REFLEX MICROSCOPIC
Bilirubin Urine: NEGATIVE
Glucose, UA: NEGATIVE mg/dL
Hgb urine dipstick: NEGATIVE
Ketones, ur: NEGATIVE mg/dL
Leukocytes, UA: NEGATIVE
Nitrite: NEGATIVE
Protein, ur: NEGATIVE mg/dL
Specific Gravity, Urine: 1.018 (ref 1.005–1.030)
pH: 5 (ref 5.0–8.0)

## 2018-02-06 MED ORDER — TAMSULOSIN HCL 0.4 MG PO CAPS
0.4000 mg | ORAL_CAPSULE | Freq: Every day | ORAL | Status: DC
  Administered 2018-02-07 – 2018-02-11 (×5): 0.4 mg via ORAL
  Filled 2018-02-06 (×5): qty 1

## 2018-02-06 MED ORDER — SODIUM CHLORIDE 0.9 % IV BOLUS
500.0000 mL | Freq: Once | INTRAVENOUS | Status: AC
  Administered 2018-02-06: 500 mL via INTRAVENOUS

## 2018-02-06 MED ORDER — HYDROCODONE-ACETAMINOPHEN 5-325 MG PO TABS
1.0000 | ORAL_TABLET | ORAL | Status: DC | PRN
  Administered 2018-02-06 – 2018-02-09 (×3): 1 via ORAL
  Filled 2018-02-06 (×3): qty 1

## 2018-02-06 MED ORDER — ENSURE ENLIVE PO LIQD
237.0000 mL | Freq: Two times a day (BID) | ORAL | Status: DC
  Administered 2018-02-07 – 2018-02-11 (×2): 237 mL via ORAL

## 2018-02-06 MED ORDER — BACLOFEN 10 MG PO TABS
10.0000 mg | ORAL_TABLET | Freq: Every day | ORAL | Status: DC | PRN
  Administered 2018-02-09: 10 mg via ORAL
  Filled 2018-02-06: qty 1

## 2018-02-06 MED ORDER — HYDROCODONE-ACETAMINOPHEN 5-325 MG PO TABS
1.0000 | ORAL_TABLET | Freq: Once | ORAL | Status: AC
  Administered 2018-02-06: 1 via ORAL
  Filled 2018-02-06: qty 1

## 2018-02-06 MED ORDER — POLYETHYLENE GLYCOL 3350 17 G PO PACK
17.0000 g | PACK | Freq: Every day | ORAL | Status: DC
  Administered 2018-02-07 – 2018-02-11 (×2): 17 g via ORAL
  Filled 2018-02-06 (×5): qty 1

## 2018-02-06 MED ORDER — APIXABAN 5 MG PO TABS
5.0000 mg | ORAL_TABLET | Freq: Two times a day (BID) | ORAL | Status: DC
  Administered 2018-02-06 – 2018-02-11 (×10): 5 mg via ORAL
  Filled 2018-02-06 (×10): qty 1

## 2018-02-06 MED ORDER — VITAMIN D3 25 MCG (1000 UNIT) PO TABS
5000.0000 [IU] | ORAL_TABLET | Freq: Every day | ORAL | Status: DC
  Administered 2018-02-06 – 2018-02-10 (×5): 5000 [IU] via ORAL
  Filled 2018-02-06 (×5): qty 5

## 2018-02-06 MED ORDER — SODIUM CHLORIDE 0.9 % IV SOLN
INTRAVENOUS | Status: AC
  Administered 2018-02-06 – 2018-02-07 (×2): via INTRAVENOUS

## 2018-02-06 MED ORDER — SENNOSIDES-DOCUSATE SODIUM 8.6-50 MG PO TABS: 1.0000 | ORAL_TABLET | Freq: Every evening | ORAL | Status: DC | PRN

## 2018-02-06 NOTE — Telephone Encounter (Signed)
Tried calling pt to set-up hosp f/u appt there was no answer.. had to [facility] RTC. Sent CRM to [facility] for fyi.Marland KitchenJohny Chess

## 2018-02-06 NOTE — ED Provider Notes (Signed)
Signout from Dr. Kathrynn Humble.  Patient with history of renal cancer status post resection A. fib on Eliquis and a prior stroke here after an unresponsive episode.  Reportedly was less responsive for 20 to 30 minutes at a pressure in the 80s by EMS.  His current work-up shows labs that are essentially baseline and it duplex of the right lower extremity that shows no DVT.  Plan is to contact the hospitalist regarding admission for syncope.  Clinical Course as of Feb 07 2323  Fri Feb 06, 2018  1646 Discussed with hospitalist who accepts patient for observation but requests that we put the patient in for CT head and order orthostatics.   [MB]    Clinical Course User Index [MB] Jeffrey Rasmussen, MD      Jeffrey Rasmussen, MD 02/06/18 [phone]

## 2018-02-06 NOTE — H&P (Signed)
History and Physical  Jeffrey Hardy ZOX:[id number] DOB: 04-Dec-1935 DOA: 02/06/2018  Referring physician: EDP PCP: Biagio Borg, MD   Chief Complaint: less responsive  for 20-2mins  HPI: Jeffrey Hardy is a 82 y.o. male   History of paroxysmal A. fib, CVA on Eliquis, history of renal cell carcinoma status post right nephrectomy in 2018, CKDIII, mild cognitive impairment, right total hip arthroplasty on 02/01/2018  With post op anemia sent to [facility] ED by EMS due to not talking for 20-30 mins at home. Per EMS patient's sbp was in the 80's. Wife states patient was sitting in his rollator waiting for PT 's first home visit, he became less responsive, not talking, EMS arrived , he did smile but does not talk. Wife reports not able to get a good pulse ox reading  Due to his cold finger, ems put him on the oxygen and sent him to the ED. Patient returned to the baseline very quick from the episode, no confusion after the episode, wife denies h/o seizure, no jerky movement, no bowel /bladder incontinence. Patient denies chest pain, no fever. Wife reports patient had a similar episode when he was dehydrated several yrs ago. Reports last dose of hydrocodone was the night before he went to bed.   ED course: vital signs unremarkable, no leukocytosis, patient has been having anemia since after the surgery prior to surgery there is no anemia.  Hemoglobin has been above 9 post surgery.  EKG sinus rhythm no acute change.  Tried hospitalist called to observe this patient for syncope.  I have requested CT of the head, orthostatic vital signs to be done in the ED.      Review of Systems:  Detail per HPI, Review of systems are otherwise negative  Past Medical History:  Diagnosis Date  . Anxiety   . Bilateral renal cysts   . Cancer ([place])    renal mass  . Depression   . Diverticulosis of colon   . Erectile dysfunction   . Hematuria   . History of cerebral embolic infarction x2 CVA's ---  neurologist-  [name]---  per pt residual abnormal gait , does need cane   12-16-2011  right frontal embolic infarct and [id number] left frontal embolic infarct (per first MRI previous hemorrgaic cerebral ischemia)  . History of duodenal ulcer    1997  . History of Helicobacter pylori infection    1997  . History of hiatal hernia   . History of loop recorder    Current  . History of syncope    1997  and 2014  s/p  loop recorder  . Hyperlipidemia   . Hypertension   . Nocturia   . PAF (paroxysmal atrial fibrillation) [facility])    cardiologist-  dr [name]  . Renal cell cancer ([place]) 09/06/2016  . Right renal mass   . Status post placement of implantable loop recorder    07-06-2012  . Stroke [facility])    2013 2 mild strokes mild weakness bil sides  . Urgency of urination   . Wears hearing aid    right ear only   Past Surgical History:  Procedure Laterality Date  . APPENDECTOMY  age 43  . COLONOSCOPY  last one 07-17-2015  . CYSTOSCOPY/RETROGRADE/URETEROSCOPY Right 04/22/2016   Procedure: CYSTOSCOPY/RETROGRADE/URETEROSCOPY/ BIOPSY/ STENT PLACEMENT;  Surgeon: [name], MD;  Location: [facility];  Service: Urology;  Laterality: Right;  . ESOPHAGOGASTRODUODENOSCOPY  last one 09-16-2014  . LOOP RECORDER IMPLANT N/A 07/06/2012  Procedure: LOOP RECORDER IMPLANT;  Surgeon: [name], MD;  Location: [facility] CATH LAB;  Service: Cardiovascular;  Laterality: N/A;  . ROBOT ASSITED LAPAROSCOPIC NEPHROURETERECTOMY Right 05/13/2016   Procedure: XI ROBOT ASSITED LAPAROSCOPIC NEPHROURETERECTOMY;  Surgeon: [name], MD;  Location: [facility] ORS;  Service: Urology;  Laterality: Right;  . TEE WITHOUT CARDIOVERSION  12/19/2011   Procedure: TRANSESOPHAGEAL ECHOCARDIOGRAM (TEE);  Surgeon: [name], MD;  Location: [facility] ENDOSCOPY;  Service: Cardiovascular;  Laterality: N/A;  no evidence thrombus in the atrial cavity or appendage;  mild AV thickened w/ mild AI;  minimal fixed plaque in thoracic aorta;  mild MV thickened  w/ mild MR;  NO pfo by color doppler or with injection of agitated saline  . TOTAL HIP ARTHROPLASTY Left 09/07/2017   Procedure: TOTAL HIP ARTHROPLASTY ANTERIOR APPROACH;  Surgeon: Rod Can, MD;  Location: [facility] ORS;  Service: Orthopedics;  Laterality: Left;  . TOTAL HIP ARTHROPLASTY Right 02/01/2018   Procedure: TOTAL HIP ARTHROPLASTY ANTERIOR APPROACH;  Surgeon: Rod Can, MD;  Location: [facility];  Service: Orthopedics;  Laterality: Right;  . TRANSTHORACIC ECHOCARDIOGRAM  09-10-20103   grade 1 diastolic dysfunction,  ef 55-60%/  AV sclerosis without stenosis/  trivial TR  . WRIST FRACTURE SURGERY Right 2014  approx.   retained hardware   Social History:  reports that he quit smoking about 51 years ago. His smoking use included cigarettes and pipe. He quit after 5.00 years of use. He has never used smokeless tobacco. He reports that he does not drink alcohol or use drugs. Patient lives at home with family & is able to participate in activities of daily living with assistance  Allergies  Allergen Reactions  . Acyclovir And Related Other (See Comments)    Unsure of what happens    Family History  Problem Relation Age of Onset  . Heart Problems Mother   . Brain cancer Father   . Parkinsonism Brother   . Dementia Brother   . Colon cancer Neg Hx   . Esophageal cancer Neg Hx   . Stomach cancer Neg Hx   . Gastric cancer Neg Hx   . Liver disease Neg Hx   . Kidney disease Neg Hx   . Diabetes Neg Hx       Prior to Admission medications   Medication Sig Start Date End Date Taking? Authorizing Provider  apixaban (ELIQUIS) 5 MG TABS tablet Take 5 mg by mouth 2 (two) times daily.   Yes [provider]  baclofen (LIORESAL) 10 MG tablet Take 10 mg by mouth daily as needed (hiccups).   Yes [provider]  Cholecalciferol (VITAMIN D3) 5000 units CAPS Take 1 capsule by mouth daily.   Yes [provider]  feeding supplement, ENSURE ENLIVE, (ENSURE ENLIVE) LIQD  Take 237 mLs by mouth 2 (two) times daily between meals. 02/04/18  Yes Hongalgi, Lenis Dickinson, MD  ferrous sulfate 325 (65 FE) MG tablet Take 325 mg by mouth daily with breakfast.   Yes [provider]  HYDROcodone-acetaminophen (NORCO/VICODIN) 5-325 MG tablet Take 1-2 tablets by mouth every 4 (four) hours as needed for moderate pain (pain score 4-6). 02/03/18  Yes Swinteck, Aaron Edelman, MD  polyethylene glycol (MIRALAX / GLYCOLAX) packet Take 17 g by mouth daily. 02/04/18  Yes Hongalgi, Lenis Dickinson, MD  senna-docusate (SENOKOT-S) 8.6-50 MG tablet Take 1 tablet by mouth at bedtime as needed for mild constipation or moderate constipation. 02/04/18  Yes Hongalgi, Lenis Dickinson, MD  tamsulosin (FLOMAX) 0.4 MG CAPS  capsule Take 1 capsule (0.4 mg total) by mouth daily after breakfast. 09/06/16  Yes Biagio Borg, MD  Trospium Chloride 60 MG CP24 Take 60 mg by mouth daily.    Yes [provider]  cholecalciferol (VITAMIN D) 1000 units tablet Take 5,000 Units by mouth at bedtime.     [provider]    Physical Exam: BP 114/70   Pulse 73   Resp 18   Ht 6\' 1"  (1.854 m)   Wt 87 kg   SpO2 99%   BMI 25.30 kg/m   General:  Hard of hearing, very pleasant, oriented x3 Eyes: PERRL ENT: unremarkable Neck: supple, no JVD Cardiovascular: RRR Respiratory: CTABL Abdomen: soft/NT/ND, positive bowel sounds Skin: no rash Musculoskeletal:  Right hip post op changes, some tenderness, no drainage, no erythema, dressing intact Psychiatric: calm/cooperative Neurologic: no focal findings            Labs on Admission:  Basic Metabolic Panel: Recent Labs  Lab 01/31/18 0734 02/02/18 0445 02/03/18 0420 02/04/18 0332 02/06/18 1207  NA 139 137 139 137 138  K 3.9 4.4 4.2 4.0 4.6  CL 109 107 108 106 106  CO2 21* 22 24 23 26   GLUCOSE 104* 128* 102* 112* 148*  BUN 24* 29* 36* 31* 36*  CREATININE 1.86* 1.93* 2.01* 1.74* 1.91*  CALCIUM 8.6* 8.1* 8.2* 8.3* 8.6*   Liver Function Tests: Recent Labs    Lab 02/06/18 1207  AST 40  ALT 56*  ALKPHOS 54  BILITOT 0.6  PROT 6.0*  ALBUMIN 3.3*   No results for input(s): LIPASE, AMYLASE in the last 168 hours. No results for input(s): AMMONIA in the last 168 hours. CBC: Recent Labs  Lab 02/01/18 1450 02/02/18 0445 02/03/18 0420 02/04/18 0332 02/06/18 1207  WBC 9.1 10.4 6.3 6.5 8.6  NEUTROABS  --   --   --   --  7.4  HGB 10.3* 9.1* 9.1* 9.1* 9.8*  HCT 31.8* 28.2* 27.6* 27.5* 30.3*  MCV 95.2 94.0 92.9 92.3 97.4  PLT 91* 99* 97* 109* 157   Cardiac Enzymes: No results for input(s): CKTOTAL, CKMB, CKMBINDEX, TROPONINI in the last 168 hours.  BNP (last 3 results) No results for input(s): BNP in the last 8760 hours.  ProBNP (last 3 results) No results for input(s): PROBNP in the last 8760 hours.  CBG: No results for input(s): GLUCAP in the last 168 hours.  Radiological Exams on Admission: No results found.  EKG: Independently reviewed.  sinus rhythm no acute change  Assessment/Plan Present on Admission: **None**  Syncope?/presyncope Ct head, echo, EEG, ua, orthostatic vital sign On exam, he does look dehydrated, will start hydration.  Post op anemia: hgb prior to surgery was normal, hgb has been above 9 since surgery Wife reports his stool color is brown, no overt sign of bleeding Will get FOBT, he has been on iron supplement, will hold off iron while awaiting FOBT Repeat cbc in am.  S/p right total hip arthroplasty on 02/01/2018  No acute issues, f/u with ortho  Paroxysmal A. fib: Patient has prior history of stroke.  continue Eliquis   CKDIII/ h/o right nephrectomy Cr at baseline ua pending Renal dosing meds  BPH; continue flomax   DVT prophylaxis: on eliquis   Consultants: none  Code Status: full   Family Communication:  Patient and wife  Disposition Plan: tele obs  Time spent: 5mins  Florencia Reasons MD, PhD [facility] Pager [phone] If 7PM-7AM, please contact night-coverage at  [email or web address],  password [facility]

## 2018-02-06 NOTE — ED Notes (Signed)
ED TO INPATIENT HANDOFF REPORT  Name/Age/Gender Dede Query Landenberger 82 y.o. male  Code Status Code Status History    Date Active Date Inactive Code Status Order ID Comments User Context   01/30/2018 0217 02/04/2018 1936 Full Code [id number]  Vianne Bulls, MD Inpatient   09/04/2017 2254 09/10/2017 1729 Full Code [id number]  Rise Patience, MD Inpatient   05/13/2016 1745 05/16/2016 1711 Full Code [id number]  Lattie Corns Inpatient   12/18/2011 1547 12/19/2011 1751 Full Code [id number]  Bonnielee Haff, MD Inpatient      Home/SNF/Other Home  Chief Complaint Syncope; Lethargy  Level of Care/Admitting Diagnosis ED Disposition    ED Disposition Condition [facility] Area: [facility] [[id number]]  Level of Care: Telemetry [5]  Admit to tele based on following criteria: Eval of Syncope  Diagnosis: Syncope [206001]  Admitting Physician: Florencia Reasons [[id number]]  Attending Physician: Florencia Reasons [[id number]]  PT Class (Do Not Modify): Observation [104]  PT Acc Code (Do Not Modify): Observation [10022]       Medical History Past Medical History:  Diagnosis Date  . Anxiety   . Bilateral renal cysts   . Cancer ([place])    renal mass  . Depression   . Diverticulosis of colon   . Erectile dysfunction   . Hematuria   . History of cerebral embolic infarction x2 CVA's ---  neurologist-  [name]--- per pt residual abnormal gait , does need cane   12-16-2011  right frontal embolic infarct and [id number] left frontal embolic infarct (per first MRI previous hemorrgaic cerebral ischemia)  . History of duodenal ulcer    1997  . History of Helicobacter pylori infection    1997  . History of hiatal hernia   . History of loop recorder    Current  . History of syncope    1997  and 2014  s/p  loop recorder  . Hyperlipidemia   . Hypertension   . Nocturia   . PAF (paroxysmal atrial fibrillation) [facility])    cardiologist-  dr [name]  . Renal cell cancer ([place]) 09/06/2016  .  Right renal mass   . Status post placement of implantable loop recorder    07-06-2012  . Stroke [facility])    2013 2 mild strokes mild weakness bil sides  . Urgency of urination   . Wears hearing aid    right ear only    Allergies Allergies  Allergen Reactions  . Acyclovir And Related Other (See Comments)    Unsure of what happens    IV Location/Drains/Wounds Patient Lines/Drains/Airways Status   Active Line/Drains/Airways    Name:   Placement date:   Placement time:   Site:   Days:   Peripheral IV 02/06/18 Left Hand   02/06/18    -    Hand   less than 1   Ureteral Drain/Stent Right ureter 6 Fr.   04/22/16    1343    Right ureter   655   Incision (Closed) 09/07/17 Hip Left   09/07/17    1003     152   Incision (Closed) 02/01/18 Hip Right   02/01/18    1334     5   Incision - 5 Ports Abdomen 1: Upper;Umbilicus 2: Right;Upper;Medial 3: Right;Upper;Lateral 4: Right;Lower;Medial 5: Right;Lower;Lateral   05/13/16    1330     634          Labs/Imaging Results for orders placed or performed during the hospital  encounter of 02/06/18 (from the past 48 hour(s))  Comprehensive metabolic panel     Status: Abnormal   Collection Time: 02/06/18 12:07 PM  Result Value Ref Range   Sodium 138 135 - 145 mmol/L   Potassium 4.6 3.5 - 5.1 mmol/L   Chloride 106 98 - 111 mmol/L   CO2 26 22 - 32 mmol/L   Glucose, Bld 148 (H) 70 - 99 mg/dL   BUN 36 (H) 8 - 23 mg/dL   Creatinine, Ser 1.91 (H) 0.61 - 1.24 mg/dL   Calcium 8.6 (L) 8.9 - 10.3 mg/dL   Total Protein 6.0 (L) 6.5 - 8.1 g/dL   Albumin 3.3 (L) 3.5 - 5.0 g/dL   AST 40 15 - 41 U/L   ALT 56 (H) 0 - 44 U/L   Alkaline Phosphatase 54 38 - 126 U/L   Total Bilirubin 0.6 0.3 - 1.2 mg/dL   GFR calc non Af Amer 32 (L) >60 mL/min   GFR calc Af Amer 37 (L) >60 mL/min    Comment: (NOTE) The eGFR has been calculated using the CKD EPI equation. This calculation has not been validated in all clinical situations. eGFR's persistently <60 mL/min signify  possible Chronic Kidney Disease.    Anion gap 6 5 - 15    Comment: Performed at [facility], [place] [address], [place], [place] [address]  CBC with Differential     Status: Abnormal   Collection Time: 02/06/18 12:07 PM  Result Value Ref Range   WBC 8.6 4.0 - 10.5 K/uL   RBC 3.11 (L) 4.22 - 5.81 MIL/uL   Hemoglobin 9.8 (L) 13.0 - 17.0 g/dL   HCT 30.3 (L) 39.0 - 52.0 %   MCV 97.4 80.0 - 100.0 fL   MCH 31.5 26.0 - 34.0 pg   MCHC 32.3 30.0 - 36.0 g/dL   RDW 14.7 11.5 - 15.5 %   Platelets 157 150 - 400 K/uL   nRBC 0.0 0.0 - 0.2 %   Neutrophils Relative % 87 %   Neutro Abs 7.4 1.7 - 7.7 K/uL   Lymphocytes Relative 6 %   Lymphs Abs 0.5 (L) 0.7 - 4.0 K/uL   Monocytes Relative 6 %   Monocytes Absolute 0.5 0.1 - 1.0 K/uL   Eosinophils Relative 0 %   Eosinophils Absolute 0.0 0.0 - 0.5 K/uL   Basophils Relative 0 %   Basophils Absolute 0.0 0.0 - 0.1 K/uL   Immature Granulocytes 1 %   Abs Immature Granulocytes 0.10 (H) 0.00 - 0.07 K/uL    Comment: Performed at [facility], [place] [address] [place], [place] [address]   No results found.  Pending Labs Unresulted Labs (From admission, onward)    Start     Ordered   02/06/18 1654  Urinalysis, Routine w reflex microscopic  Once,   R     02/06/18 1653          Vitals/Pain Today's Vitals   02/06/18 1500 02/06/18 1600 02/06/18 1700 02/06/18 1743  BP: 114/70 122/75 119/74   Pulse: 73 73 76   Resp: 18 14 (!) 24   SpO2: 99% 100% 99%   Weight:      Height:      PainSc:    0-No pain    Isolation Precautions No active isolations  Medications Medications  HYDROcodone-acetaminophen (NORCO/VICODIN) 5-325 MG per tablet 1 tablet (1 tablet Oral Given 02/06/18 1335)  sodium chloride 0.9 % bolus 500 mL (0 mLs Intravenous Stopped 02/06/18  1712)    Mobility walks with device

## 2018-02-06 NOTE — ED Triage Notes (Signed)
Pt BIB EMS from home. Pt recently had hip surgery after falling and breaking his hip. Rehab visited the pt today and found him slumped over and barely responsive. Pts wife reports they were eating breakfast and the pt seemed to be having AMS. Pt became A&O x3  which is baseline with EMS. Pt has hx of dementia. Pt has been taking prescription pain medicine post-surgery.   102/70 HR 82 Unable to get sat to read, pt placed on O2 4L.

## 2018-02-06 NOTE — Progress Notes (Signed)
Right lower extremity venous duplex completed. Preliminary results - There is no evidence of DVT or Baker's cyst. Johannah Rozas,RVS 02/06/2018, 1:12 pm

## 2018-02-06 NOTE — ED Notes (Signed)
US at bedside

## 2018-02-06 NOTE — Progress Notes (Signed)
Received pt from the ED, in stable condition, IVF started, Tele placed--SR noted, pt RT hip dressing D/I, VSs stable, voiding. UA obtaned and sent to lab. Orthostatic done as ordered. Will cont to monitor. SRP, RN

## 2018-02-06 NOTE — Plan of Care (Signed)
  Problem: Pain Managment: Goal: General experience of comfort will improve Outcome: Progressing   

## 2018-02-06 NOTE — ED Notes (Signed)
Patient transported to CT 

## 2018-02-06 NOTE — ED Provider Notes (Addendum)
[facility] DEPT Provider Note   CSN: [id number] Arrival date & time: 02/06/18  1115     History   Chief Complaint No chief complaint on file.   HPI Jeffrey Hardy is a 82 y.o. male.  HPI  82 year old male with history of A. fib and stroke on Eliquis and a recent admission for right-sided hip replacement comes in with chief complaint of unresponsive spell.  Patient also has history of renal cancer, status post resection of his left kidneys.  According to patient's wife, around 10 AM patient got up to have breakfast and was awaiting physical therapy.  When she went to reassess him shortly after, patient was noted to have slumped and he was unresponsive.  Patient remained unresponsive for several minutes until EMS got there.  She reports that patient started responding when oxygen was provided.  At home, the pulse ox monitor was not reading and O2 sat and patient was noted to have a blood pressure in the 09F for systolic.  Patient denies any chest pain, shortness of breath, palpitations and he denies any prodrome prior to his symptoms.  Past Medical History:  Diagnosis Date  . Anxiety   . Bilateral renal cysts   . Cancer ([place])    renal mass  . Depression   . Diverticulosis of colon   . Erectile dysfunction   . Hematuria   . History of cerebral embolic infarction x2 CVA's ---  neurologist-  [name]--- per pt residual abnormal gait , does need cane   12-16-2011  right frontal embolic infarct and [id number] left frontal embolic infarct (per first MRI previous hemorrgaic cerebral ischemia)  . History of duodenal ulcer    1997  . History of Helicobacter pylori infection    1997  . History of hiatal hernia   . History of loop recorder    Current  . History of syncope    1997  and 2014  s/p  loop recorder  . Hyperlipidemia   . Hypertension   . Nocturia   . PAF (paroxysmal atrial fibrillation) [facility])    cardiologist-  dr [name]  . Renal cell cancer  ([place]) 09/06/2016  . Right renal mass   . Status post placement of implantable loop recorder    07-06-2012  . Stroke [facility])    2013 2 mild strokes mild weakness bil sides  . Urgency of urination   . Wears hearing aid    right ear only    Patient Active Problem List   Diagnosis Date Noted  . Syncope 02/06/2018  . Closed displaced fracture of right femoral neck ([place]) 02/01/2018  . PAF (paroxysmal atrial fibrillation) ([place]) 01/30/2018  . Closed right hip fracture, initial encounter ([place]) 01/29/2018  . CKD (chronic kidney disease) stage 3, GFR 30-59 ml/min (HCC) 09/04/2017  . Hip fracture ([place]) 09/04/2017  . Renal cell cancer ([place]) 09/06/2016  . Urinary frequency 03/08/2016  . Memory dysfunction 03/08/2016  . History of colonic polyps 07/07/2015  . Hyperglycemia 07/04/2015  . Cerebrovascular disease, arteriosclerotic, post-stroke 12/28/2013  . Shingles 06/29/2013  . Incontinent of urine 12/29/2012  . Thrombocytopenia ([place]) 01/13/2012  . 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 Preventative health care 07/27/2010  . Hyperlipidemia 05/12/2008  . Essential hypertension 05/12/2008  . GERD 05/12/2008  . DIVERTICULOSIS, COLON 05/12/2008    Past Surgical History:  Procedure Laterality Date  . APPENDECTOMY  age 65  . COLONOSCOPY  last one 07-17-2015  . CYSTOSCOPY/RETROGRADE/URETEROSCOPY Right 04/22/2016   Procedure: CYSTOSCOPY/RETROGRADE/URETEROSCOPY/  BIOPSY/ STENT PLACEMENT;  Surgeon: [name], MD;  Location: [facility];  Service: Urology;  Laterality: Right;  . ESOPHAGOGASTRODUODENOSCOPY  last one 09-16-2014  . LOOP RECORDER IMPLANT N/A 07/06/2012   Procedure: LOOP RECORDER IMPLANT;  Surgeon: [name], MD;  Location[facility] CATH LAB;  Service: Cardiovascular;  Laterality: N/A;  . ROBOT ASSITED LAPAROSCOPIC NEPHROURETERECTOMY Right 05/13/2016   Procedure: XI ROBOT ASSITED LAPAROSCOPIC NEPHROURETERECTOMY;  Surgeon: [name], MD;  Location: [facility];  Service: Urology;   Laterality: Right;  . TEE WITHOUT CARDIOVERSION  12/19/2011   Procedure: TRANSESOPHAGEAL ECHOCARDIOGRAM (TEE);  Surgeon: [name], MD;  Location: [facility] ENDOSCOPY;  Service: Cardiovascular;  Laterality: N/A;  no evidence thrombus in the atrial cavity or appendage;  mild AV thickened w/ mild AI;  minimal fixed plaque in thoracic aorta;  mild MV thickened w/ mild MR;  NO pfo by color doppler or with injection of agitated saline  . TOTAL HIP ARTHROPLASTY Left 09/07/2017   Procedure: TOTAL HIP ARTHROPLASTY ANTERIOR APPROACH;  Surgeon: Rod Can, MD;  Location: [facility];  Service: Orthopedics;  Laterality: Left;  . TOTAL HIP ARTHROPLASTY Right 02/01/2018   Procedure: TOTAL HIP ARTHROPLASTY ANTERIOR APPROACH;  Surgeon: Rod Can, MD;  Location: [place];  Service: Orthopedics;  Laterality: Right;  . TRANSTHORACIC ECHOCARDIOGRAM  09-10-20103   grade 1 diastolic dysfunction,  ef 55-60%/  AV sclerosis without stenosis/  trivial TR  . WRIST FRACTURE SURGERY Right 2014  approx.   retained hardware        Home Medications    Prior to Admission medications   Medication Sig Start Date End Date Taking? Authorizing Provider  apixaban (ELIQUIS) 5 MG TABS tablet Take 5 mg by mouth 2 (two) times daily.   Yes [provider]  baclofen (LIORESAL) 10 MG tablet Take 10 mg by mouth daily as needed (hiccups).   Yes [provider]  Cholecalciferol (VITAMIN D3) 5000 units CAPS Take 1 capsule by mouth daily.   Yes [provider]  feeding supplement, ENSURE ENLIVE, (ENSURE ENLIVE) LIQD Take 237 mLs by mouth 2 (two) times daily between meals. 02/04/18  Yes Hongalgi, Lenis Dickinson, MD  ferrous sulfate 325 (65 FE) MG tablet Take 325 mg by mouth daily with breakfast.   Yes [provider]  HYDROcodone-acetaminophen (NORCO/VICODIN) 5-325 MG tablet Take 1-2 tablets by mouth every 4 (four) hours as needed for moderate pain (pain score 4-6). 02/03/18  Yes Swinteck, Aaron Edelman, MD  polyethylene  glycol (MIRALAX / GLYCOLAX) packet Take 17 g by mouth daily. 02/04/18  Yes Hongalgi, Lenis Dickinson, MD  senna-docusate (SENOKOT-S) 8.6-50 MG tablet Take 1 tablet by mouth at bedtime as needed for mild constipation or moderate constipation. 02/04/18  Yes Modena Jansky, MD  tamsulosin (FLOMAX) 0.4 MG CAPS capsule Take 1 capsule (0.4 mg total) by mouth daily after breakfast. 09/06/16  Yes Biagio Borg, MD  Trospium Chloride 60 MG CP24 Take 60 mg by mouth daily.    Yes [provider]  cholecalciferol (VITAMIN D) 1000 units tablet Take 5,000 Units by mouth at bedtime.     [provider]    Family History Family History  Problem Relation Age of Onset  . Heart Problems Mother   . Brain cancer Father   . Parkinsonism Brother   . Dementia Brother   . Colon cancer Neg Hx   . Esophageal cancer Neg Hx   . Stomach cancer Neg Hx   . Gastric cancer Neg  Hx   . Liver disease Neg Hx   . Kidney disease Neg Hx   . Diabetes Neg Hx     Social History Social History   Tobacco Use  . Smoking status: Former Smoker    Years: 5.00    Types: Cigarettes, Pipe    Last attempt to quit: 09/07/1966    Years since quitting: 51.4  . Smokeless tobacco: Never Used  Substance Use Topics  . Alcohol use: No    Alcohol/week: 0.0 standard drinks  . Drug use: No     Allergies   Acyclovir and related   Review of Systems Review of Systems  Constitutional: Positive for activity change.  Cardiovascular: Negative for chest pain and palpitations.  Neurological: Positive for syncope. Negative for dizziness.  Hematological: Does not bruise/bleed easily.  All other systems reviewed and are negative.    Physical Exam Updated Vital Signs BP 113/73 (BP Location: Right Arm)   Pulse 76   Temp 98.3 F (36.8 C) (Oral)   Resp 16   Ht 6\' 1"  (1.854 m)   Wt 87.5 kg   SpO2 95%   BMI 25.44 kg/m   Physical Exam  Constitutional: He is oriented to person, place, and time. He appears well-developed.    HENT:  Head: Atraumatic.  Neck: Neck supple.  Cardiovascular: Normal rate.  Pulmonary/Chest: Effort normal.  Musculoskeletal: He exhibits edema. He exhibits no tenderness.  Unilateral right lower extremity edema  Neurological: He is alert and oriented to person, place, and time.  Skin: Skin is warm.  Nursing note and vitals reviewed.    ED Treatments / Results  Labs (all labs ordered are listed, but only abnormal results are displayed) Labs Reviewed  COMPREHENSIVE METABOLIC PANEL - Abnormal; Notable for the following components:      Result Value   Glucose, Bld 148 (*)    BUN 36 (*)    Creatinine, Ser 1.91 (*)    Calcium 8.6 (*)    Total Protein 6.0 (*)    Albumin 3.3 (*)    ALT 56 (*)    GFR calc non Af Amer 32 (*)    GFR calc Af Amer 37 (*)    All other components within normal limits  CBC WITH DIFFERENTIAL/PLATELET - Abnormal; Notable for the following components:   RBC 3.11 (*)    Hemoglobin 9.8 (*)    HCT 30.3 (*)    Lymphs Abs 0.5 (*)    Abs Immature Granulocytes 0.10 (*)    All other components within normal limits  CBC WITH DIFFERENTIAL/PLATELET - Abnormal; Notable for the following components:   RBC 2.90 (*)    Hemoglobin 9.0 (*)    HCT 28.5 (*)    All other components within normal limits  COMPREHENSIVE METABOLIC PANEL - Abnormal; Notable for the following components:   Glucose, Bld 107 (*)    BUN 34 (*)    Creatinine, Ser 1.88 (*)    Calcium 8.2 (*)    Total Protein 5.2 (*)    Albumin 3.0 (*)    GFR calc non Af Amer 32 (*)    GFR calc Af Amer 38 (*)    All other components within normal limits  URINALYSIS, ROUTINE W REFLEX MICROSCOPIC  OCCULT BLOOD X 1 CARD TO LAB, STOOL    EKG EKG Interpretation  Date/Time:  Friday February 06 2018 15:57:22 EDT Ventricular Rate:  76 PR Interval:    QRS Duration: 88 QT Interval:  401 QTC Calculation: 451 R Axis:  69 Text Interpretation:  Sinus rhythm Abnormal R-wave progression, early transition No acute  changes Confirmed by Varney Biles [phone]) on 02/06/2018 4:33:10 PM Also confirmed by Varney Biles [phone])  on 02/06/2018 4:34:53 PM   Radiology Ct Head Wo Contrast  Result Date: 02/06/2018 CLINICAL DATA:  Altered mental status. EXAM: CT HEAD WITHOUT CONTRAST TECHNIQUE: Contiguous axial images were obtained from the base of the skull through the vertex without intravenous contrast. COMPARISON:  Brain MRI 01/04/2012, head CT 01/04/2012 FINDINGS: Brain: No evidence of acute infarction, hemorrhage, hydrocephalus, extra-axial collection or mass lesion/mass effect. Areas of encephalomalacia in bilateral frontal lobes, left parietal lobe and left occipital lobe from prior ischemic infarctions. Marked brain parenchymal volume loss and deep white matter microangiopathy. Vascular: Calcific atherosclerotic disease at the skull base. Skull: Normal. Negative for fracture or focal lesion. Sinuses/Orbits: No acute finding. Other: None. IMPRESSION: Areas of encephalomalacia in bilateral frontal lobes, left parietal lobe and left occipital lobe, from prior ischemic infarctions. No definite acute infarction is seen. No evidence of intracranial hemorrhage. Marked brain parenchymal volume loss and deep white matter microangiopathy. Electronically Signed   By: Fidela Salisbury M.D.   On: 02/06/2018 17:41    Procedures Procedures (including critical care time)  Medications Ordered in ED Medications  0.9 %  sodium chloride infusion ( Intravenous New Bag/Given 02/07/18 0722)  apixaban (ELIQUIS) tablet 5 mg (5 mg Oral Given 02/06/18 2138)  baclofen (LIORESAL) tablet 10 mg (has no administration in time range)  cholecalciferol (VITAMIN D) tablet 5,000 Units (5,000 Units Oral Given 02/06/18 2137)  feeding supplement (ENSURE ENLIVE) (ENSURE ENLIVE) liquid 237 mL (has no administration in time range)  HYDROcodone-acetaminophen (NORCO/VICODIN) 5-325 MG per tablet 1-2 tablet (1 tablet Oral Given 02/06/18 1857)  polyethylene  glycol (MIRALAX / GLYCOLAX) packet 17 g (17 g Oral Not Given 02/06/18 2138)  senna-docusate (Senokot-S) tablet 1 tablet (has no administration in time range)  tamsulosin (FLOMAX) capsule 0.4 mg (has no administration in time range)  HYDROcodone-acetaminophen (NORCO/VICODIN) 5-325 MG per tablet 1 tablet (1 tablet Oral Given 02/06/18 1335)  sodium chloride 0.9 % bolus 500 mL (0 mLs Intravenous Stopped 02/06/18 1712)     Initial Impression / Assessment and Plan / ED Course  I have reviewed the triage vital signs and the nursing notes.  Pertinent labs & imaging results that were available during my care of the patient were reviewed by me and considered in my medical decision making (see chart for details).  Clinical Course as of Feb 08 839  Fri Feb 06, 2018  1646 Discussed with hospitalist who accepts patient for observation but requests that we put the patient in for CT head and order orthostatics.   [MB]    Clinical Course User Index [MB] Hayden Rasmussen, MD    82 year old male with history of A. fib on Eliquis, stroke, recent right-sided hip replacement surgery comes in after what appears to be a syncopal episode.  Differential diagnosis includes cardiac dysrhythmia, electrolyte abnormality, orthostatic syncope.  Patient was hypotensive according to EMS when they arrived.  He has right lower extremity unilateral swelling, which could be likely because of recent surgery but ultrasound DVT has been ordered.  We considered PE in the differential, however he is already on Eliquis which makes having a PE less likely.  Given the low pretest probability, we will proceed with ultrasound DVT only which if negative we would presume that there was no PE.  Additionally, we considered absence seizure and transient global amnesia in  the differential diagnosis as well, but both of them appear to be less likely than a primary syncopal spell.  What is concerning is that patient had hypotension when EMS  first arrived and patient had no prodrome prior to the syncopal spell.  Possible admission for syncope work-up.   8:40 AM Discussed the results of the ED work-up with the patient and his wife.  Patient has known chronic renal failure, but his labs otherwise are reassuring.  EKG and telemetry monitoring has been normal. Whilst patient is comfortable going home, wife feels like it might be better for patient to be observed overnight -as a prolonged episode of syncope with low blood pressure is concerning to her.  We will request for an observation admission.   Final Clinical Impressions(s) / ED Diagnoses   Final diagnoses:  Syncope, unspecified syncope type    ED Discharge Orders    None       Varney Biles, MD 02/06/18 [place], Shron Ozer, MD 02/07/18 0840

## 2018-02-06 NOTE — ED Notes (Signed)
Bed: WA04 Expected date:  Expected time:  Means of arrival:  Comments: EMS- 82 yo syncope, lethargy

## 2018-02-07 ENCOUNTER — Observation Stay (HOSPITAL_BASED_OUTPATIENT_CLINIC_OR_DEPARTMENT_OTHER): Payer: PPO

## 2018-02-07 DIAGNOSIS — R55 Syncope and collapse: Secondary | ICD-10-CM

## 2018-02-07 LAB — CBC WITH DIFFERENTIAL/PLATELET
Abs Immature Granulocytes: 0.06 10*3/uL (ref 0.00–0.07)
Basophils Absolute: 0 10*3/uL (ref 0.0–0.1)
Basophils Relative: 0 %
Eosinophils Absolute: 0.2 10*3/uL (ref 0.0–0.5)
Eosinophils Relative: 4 %
HCT: 28.5 % — ABNORMAL LOW (ref 39.0–52.0)
Hemoglobin: 9 g/dL — ABNORMAL LOW (ref 13.0–17.0)
Immature Granulocytes: 1 %
Lymphocytes Relative: 13 %
Lymphs Abs: 0.8 10*3/uL (ref 0.7–4.0)
MCH: 31 pg (ref 26.0–34.0)
MCHC: 31.6 g/dL (ref 30.0–36.0)
MCV: 98.3 fL (ref 80.0–100.0)
Monocytes Absolute: 0.6 10*3/uL (ref 0.1–1.0)
Monocytes Relative: 9 %
Neutro Abs: 4.4 10*3/uL (ref 1.7–7.7)
Neutrophils Relative %: 73 %
Platelets: 154 10*3/uL (ref 150–400)
RBC: 2.9 MIL/uL — ABNORMAL LOW (ref 4.22–5.81)
RDW: 14.9 % (ref 11.5–15.5)
WBC: 6 10*3/uL (ref 4.0–10.5)
nRBC: 0 % (ref 0.0–0.2)

## 2018-02-07 LAB — COMPREHENSIVE METABOLIC PANEL
ALT: 43 U/L (ref 0–44)
AST: 27 U/L (ref 15–41)
Albumin: 3 g/dL — ABNORMAL LOW (ref 3.5–5.0)
Alkaline Phosphatase: 48 U/L (ref 38–126)
Anion gap: 6 (ref 5–15)
BUN: 34 mg/dL — ABNORMAL HIGH (ref 8–23)
CO2: 23 mmol/L (ref 22–32)
Calcium: 8.2 mg/dL — ABNORMAL LOW (ref 8.9–10.3)
Chloride: 110 mmol/L (ref 98–111)
Creatinine, Ser: 1.88 mg/dL — ABNORMAL HIGH (ref 0.61–1.24)
GFR calc Af Amer: 38 mL/min — ABNORMAL LOW (ref >60)
GFR calc non Af Amer: 32 mL/min — ABNORMAL LOW (ref >60)
Glucose, Bld: 107 mg/dL — ABNORMAL HIGH (ref 70–99)
Potassium: 4.2 mmol/L (ref 3.5–5.1)
Sodium: 139 mmol/L (ref 135–145)
Total Bilirubin: 0.7 mg/dL (ref 0.3–1.2)
Total Protein: 5.2 g/dL — ABNORMAL LOW (ref 6.5–8.1)

## 2018-02-07 LAB — ECHOCARDIOGRAM COMPLETE
Height: 73 in
Weight: 3084.8 oz

## 2018-02-07 NOTE — Progress Notes (Signed)
[facility]  PROGRESS NOTE  KIMM UNGARO BSJ:[id number] DOB: [birth year] DOA: 02/06/2018 PCP: Biagio Borg, MD   Brief HPI:   82 year old male with a history of paroxysmal atrial fibrillation, CVA on Eliquis, history of renal cell carcinoma status post right nephrectomy in 2018, CKD stage III, mild cognitive impairment, right total hip arthroplasty on 02/01/2018 with postoperative anemia was brought to hospital after patient was found to be unresponsive for about 20 minutes as per patient's wife.  Per wife patient was sitting in chair for about 20 minutes at that time her blood pressure was 80/50 and by the time EMS came patient was back to his baseline mentally.     Subjective   This morning patient denies any chest pain or shortness of breath.  CT head showed no acute abnormality.   Assessment/Plan:     1. Syncope-unclear etiology, CT head is negative.  UA is clear, echocardiogram shows grade 1 diastolic dysfunction, EF 76%.  EEG is currently pending.  Telemetry shows normal sinus rhythm.  We will check orthostatic vital signs every shift.  2. Postoperative anemia-hemoglobin has been stable around 9.  We will continue to monitor hemoglobin in the hospital.  3. Status post right total hip arthroplasty-follow-up orthopedics as outpatient  4. Paroxysmal atrial fibrillation-patient has prior history of stroke, continue Eliquis.  5. CKD stage III, history of right nephrectomy-creatinine at baseline  6. BPH-continue Flomax       CBC: Recent Labs  Lab 02/02/18 0445 02/03/18 0420 02/04/18 0332 02/06/18 1207 02/07/18 0417  WBC 10.4 6.3 6.5 8.6 6.0  NEUTROABS  --   --   --  7.4 4.4  HGB 9.1* 9.1* 9.1* 9.8* 9.0*  HCT 28.2* 27.6* 27.5* 30.3* 28.5*  MCV 94.0 92.9 92.3 97.4 98.3  PLT 99* 97* 109* 157 546    Basic Metabolic Panel: Recent Labs  Lab 02/02/18 0445 02/03/18 0420 02/04/18 0332 02/06/18 1207 02/07/18 0417  NA 137 139 137 138 139  K 4.4 4.2 4.0 4.6 4.2   CL 107 108 106 106 110  CO2 22 24 23 26 23   GLUCOSE 128* 102* 112* 148* 107*  BUN 29* 36* 31* 36* 34*  CREATININE 1.93* 2.01* 1.74* 1.91* 1.88*  CALCIUM 8.1* 8.2* 8.3* 8.6* 8.2*     DVT prophylaxis: Eliquis  Code Status: Full code  Family Communication: Discussed with patient's wife at bedside  Disposition Plan: likely home when medically ready for discharge   Consultants:  None  Procedures:  None   Antibiotics:   Anti-infectives (From admission, onward)   None       Objective   Vitals:   02/06/18 1827 02/06/18 2052 02/07/18 0456 02/07/18 1430  BP: 110/66 122/73 113/73 112/68  Pulse:  82 76 85  Resp:  16 16 14   Temp:  98.9 F (37.2 C) 98.3 F (36.8 C) 98.3 F (36.8 C)  TempSrc:  Oral Oral Oral  SpO2:  98% 95% 98%  Weight:      Height:        Intake/Output Summary (Last 24 hours) at 02/07/2018 1504 Last data filed at 02/07/2018 1230 Gross per 24 hour  Intake 832.8 ml  Output 1000 ml  Net -167.2 ml   Filed Weights   02/06/18 1130 02/06/18 1811  Weight: 87 kg 87.5 kg     Physical Examination:    General: Appears in no acute distress  Cardiovascular: S1-S2, regular  Respiratory: Clear to auscultation bilaterally  Abdomen: Soft, nontender, no organomegaly  Extremities:  No edema of the lower extremities  Neurologic: Alert, oriented x3, no focal deficit noted.     Data Reviewed: I have personally reviewed following labs and imaging studies   Recent Results (from the past 240 hour(s))  Surgical PCR screen     Status: None   Collection Time: 01/30/18  2:17 AM  Result Value Ref Range Status   MRSA, PCR NEGATIVE NEGATIVE Final   Staphylococcus aureus NEGATIVE NEGATIVE Final    Comment: (NOTE) The Xpert SA Assay (FDA approved for NASAL specimens in patients 57 years of age and older), is one component of a comprehensive surveillance program. It is not intended to diagnose infection nor to guide or monitor treatment. Performed at  [facility], [place] [address] [place], [place] [address]      Liver Function Tests: Recent Labs  Lab 02/06/18 1207 02/07/18 0417  AST 40 27  ALT 56* 43  ALKPHOS 54 48  BILITOT 0.6 0.7  PROT 6.0* 5.2*  ALBUMIN 3.3* 3.0*   No results for input(s): LIPASE, AMYLASE in the last 168 hours. No results for input(s): AMMONIA in the last 168 hours.  Cardiac Enzymes: No results for input(s): CKTOTAL, CKMB, CKMBINDEX, TROPONINI in the last 168 hours. BNP (last 3 results) No results for input(s): BNP in the last 8760 hours.  ProBNP (last 3 results) No results for input(s): PROBNP in the last 8760 hours.    Studies: Ct Head Wo Contrast  Result Date: 02/06/2018 CLINICAL DATA:  Altered mental status. EXAM: CT HEAD WITHOUT CONTRAST TECHNIQUE: Contiguous axial images were obtained from the base of the skull through the vertex without intravenous contrast. COMPARISON:  Brain MRI 01/04/2012, head CT 01/04/2012 FINDINGS: Brain: No evidence of acute infarction, hemorrhage, hydrocephalus, extra-axial collection or mass lesion/mass effect. Areas of encephalomalacia in bilateral frontal lobes, left parietal lobe and left occipital lobe from prior ischemic infarctions. Marked brain parenchymal volume loss and deep white matter microangiopathy. Vascular: Calcific atherosclerotic disease at the skull base. Skull: Normal. Negative for fracture or focal lesion. Sinuses/Orbits: No acute finding. Other: None. IMPRESSION: Areas of encephalomalacia in bilateral frontal lobes, left parietal lobe and left occipital lobe, from prior ischemic infarctions. No definite acute infarction is seen. No evidence of intracranial hemorrhage. Marked brain parenchymal volume loss and deep white matter microangiopathy. Electronically Signed   By: Fidela Salisbury M.D.   On: 02/06/2018 17:41    Scheduled Meds: . apixaban  5 mg Oral BID  . cholecalciferol  5,000 Units Oral QHS  . feeding supplement (ENSURE ENLIVE)  237  mL Oral BID BM  . polyethylene glycol  17 g Oral Daily  . tamsulosin  0.4 mg Oral QPC breakfast    Admission status: Observation  Time spent: 20 min  [facility] Pager [phone]. If 7PM-7AM, please contact night-coverage at [email or web address], Office  [phone]  password [email or web address]  02/07/2018, 3:04 PM  LOS: 0 days

## 2018-02-07 NOTE — Plan of Care (Signed)
  Problem: Education: Goal: Knowledge of General Education information will improve Description Including pain rating scale, medication(s)/side effects and non-pharmacologic comfort measures Outcome: Progressing   Problem: Health Behavior/Discharge Planning: Goal: Ability to manage health-related needs will improve Outcome: Progressing   Problem: Clinical Measurements: Goal: Ability to maintain clinical measurements within normal limits will improve Outcome: Progressing Goal: Diagnostic test results will improve Outcome: Progressing   Problem: Activity: Goal: Risk for activity intolerance will decrease Outcome: Progressing   Problem: Nutrition: Goal: Adequate nutrition will be maintained Outcome: Progressing   Problem: Pain Managment: Goal: General experience of comfort will improve Outcome: Progressing   Problem: Safety: Goal: Ability to remain free from injury will improve Outcome: Progressing   Problem: Skin Integrity: Goal: Risk for impaired skin integrity will decrease Outcome: Progressing   

## 2018-02-08 LAB — CBC
HCT: 28.4 % — ABNORMAL LOW (ref 39.0–52.0)
Hemoglobin: 8.6 g/dL — ABNORMAL LOW (ref 13.0–17.0)
MCH: 30.9 pg (ref 26.0–34.0)
MCHC: 30.3 g/dL (ref 30.0–36.0)
MCV: 102.2 fL — ABNORMAL HIGH (ref 80.0–100.0)
Platelets: 165 10*3/uL (ref 150–400)
RBC: 2.78 MIL/uL — ABNORMAL LOW (ref 4.22–5.81)
RDW: 14.7 % (ref 11.5–15.5)
WBC: 6.6 10*3/uL (ref 4.0–10.5)
nRBC: 0 % (ref 0.0–0.2)

## 2018-02-08 LAB — BASIC METABOLIC PANEL
Anion gap: 7 (ref 5–15)
BUN: 33 mg/dL — ABNORMAL HIGH (ref 8–23)
CO2: 24 mmol/L (ref 22–32)
Calcium: 8.5 mg/dL — ABNORMAL LOW (ref 8.9–10.3)
Chloride: 110 mmol/L (ref 98–111)
Creatinine, Ser: 1.74 mg/dL — ABNORMAL HIGH (ref 0.61–1.24)
GFR calc Af Amer: 41 mL/min — ABNORMAL LOW (ref >60)
GFR calc non Af Amer: 36 mL/min — ABNORMAL LOW (ref >60)
Glucose, Bld: 102 mg/dL — ABNORMAL HIGH (ref 70–99)
Potassium: 4.3 mmol/L (ref 3.5–5.1)
Sodium: 141 mmol/L (ref 135–145)

## 2018-02-08 NOTE — Progress Notes (Signed)
[facility]  PROGRESS NOTE  Jeffrey Hardy WYO:[id number] DOB: 02/13/1936 DOA: 02/06/2018 PCP: Biagio Borg, MD   Brief HPI:   82 year old male with a history of paroxysmal atrial fibrillation, CVA on Eliquis, history of renal cell carcinoma status post right nephrectomy in 2018, CKD stage III, mild cognitive impairment, right total hip arthroplasty on 02/01/2018 with postoperative anemia was brought to hospital after patient was found to be unresponsive for about 20 minutes as per patient's wife.  Per wife patient was sitting in chair for about 20 minutes at that time her blood pressure was 80/50 and by the time EMS came patient was back to his baseline mentally.     Subjective   Patient seen and examined, admitted with syncope.  Echocardiogram is unrevealing.  EEG is pending.   Assessment/Plan:     1. Syncope-unclear etiology, CT head is negative.  UA is clear, echocardiogram shows grade 1 diastolic dysfunction, EF 77%.  EEG is currently pending.  Telemetry shows normal sinus rhythm.  CheckOrthostatic vital signs every shift  2. Postoperative anemia-hemoglobin has been stable around 9.  We will continue to monitor hemoglobin in the hospital.  3. Status post right total hip arthroplasty-follow-up orthopedics as outpatient  4. Paroxysmal atrial fibrillation-patient has prior history of stroke, continue Eliquis.  5. CKD stage III, history of right nephrectomy-creatinine at baseline  6. BPH-continue Flomax       CBC: Recent Labs  Lab 02/03/18 0420 02/04/18 0332 02/06/18 1207 02/07/18 0417 02/08/18 0509  WBC 6.3 6.5 8.6 6.0 6.6  NEUTROABS  --   --  7.4 4.4  --   HGB 9.1* 9.1* 9.8* 9.0* 8.6*  HCT 27.6* 27.5* 30.3* 28.5* 28.4*  MCV 92.9 92.3 97.4 98.3 102.2*  PLT 97* 109* 157 154 412    Basic Metabolic Panel: Recent Labs  Lab 02/03/18 0420 02/04/18 0332 02/06/18 1207 02/07/18 0417 02/08/18 0509  NA 139 137 138 139 141  K 4.2 4.0 4.6 4.2 4.3  CL 108 106  106 110 110  CO2 24 23 26 23 24   GLUCOSE 102* 112* 148* 107* 102*  BUN 36* 31* 36* 34* 33*  CREATININE 2.01* 1.74* 1.91* 1.88* 1.74*  CALCIUM 8.2* 8.3* 8.6* 8.2* 8.5*     DVT prophylaxis: Eliquis  Code Status: Full code  Family Communication: Discussed with patient's wife at bedside  Disposition Plan: likely home when medically ready for discharge   Consultants:  None  Procedures:  None   Antibiotics:   Anti-infectives (From admission, onward)   None       Objective   Vitals:   02/07/18 2035 02/07/18 2040 02/08/18 0556 02/08/18 0921  BP: 139/70 128/72 115/68 109/66  Pulse: (!) 103 (!) 122 71 (!) 105  Resp:   16 16  Temp:   98.1 F (36.7 C) 98 F (36.7 C)  TempSrc:   Oral Oral  SpO2: 100% 90% 97% 93%  Weight:      Height:        Intake/Output Summary (Last 24 hours) at 02/08/2018 1140 Last data filed at 02/08/2018 1030 Gross per 24 hour  Intake 1199.9 ml  Output 1400 ml  Net -200.1 ml   Filed Weights   02/06/18 1130 02/06/18 1811  Weight: 87 kg 87.5 kg     Physical Examination:     Neck: Supple, no deformities, masses, or tenderness Lungs: Normal respiratory effort, bilateral clear to auscultation, no crackles or wheezes.  Heart: Regular rate and rhythm, S1 and S2 normal,  no murmurs, rubs auscultated Abdomen: BS normoactive,soft,nondistended,non-tender to palpation,no organomegaly Extremities: No pretibial edema, no erythema, no cyanosis, no clubbing Neuro : Alert and oriented to time, place and person, No focal deficits  Skin: No rashes seen on exam     Data Reviewed: I have personally reviewed following labs and imaging studies   Recent Results (from the past 240 hour(s))  Surgical PCR screen     Status: None   Collection Time: 01/30/18  2:17 AM  Result Value Ref Range Status   MRSA, PCR NEGATIVE NEGATIVE Final   Staphylococcus aureus NEGATIVE NEGATIVE Final    Comment: (NOTE) The Xpert SA Assay (FDA approved for NASAL specimens  in patients 18 years of age and older), is one component of a comprehensive surveillance program. It is not intended to diagnose infection nor to guide or monitor treatment. Performed at [facility], [place] [address] [place], [place] [address]      Liver Function Tests: Recent Labs  Lab 02/06/18 1207 02/07/18 0417  AST 40 27  ALT 56* 43  ALKPHOS 54 48  BILITOT 0.6 0.7  PROT 6.0* 5.2*  ALBUMIN 3.3* 3.0*   No results for input(s): LIPASE, AMYLASE in the last 168 hours. No results for input(s): AMMONIA in the last 168 hours.  Cardiac Enzymes: No results for input(s): CKTOTAL, CKMB, CKMBINDEX, TROPONINI in the last 168 hours. BNP (last 3 results) No results for input(s): BNP in the last 8760 hours.  ProBNP (last 3 results) No results for input(s): PROBNP in the last 8760 hours.    Studies: Ct Head Wo Contrast  Result Date: 02/06/2018 CLINICAL DATA:  Altered mental status. EXAM: CT HEAD WITHOUT CONTRAST TECHNIQUE: Contiguous axial images were obtained from the base of the skull through the vertex without intravenous contrast. COMPARISON:  Brain MRI 01/04/2012, head CT 01/04/2012 FINDINGS: Brain: No evidence of acute infarction, hemorrhage, hydrocephalus, extra-axial collection or mass lesion/mass effect. Areas of encephalomalacia in bilateral frontal lobes, left parietal lobe and left occipital lobe from prior ischemic infarctions. Marked brain parenchymal volume loss and deep white matter microangiopathy. Vascular: Calcific atherosclerotic disease at the skull base. Skull: Normal. Negative for fracture or focal lesion. Sinuses/Orbits: No acute finding. Other: None. IMPRESSION: Areas of encephalomalacia in bilateral frontal lobes, left parietal lobe and left occipital lobe, from prior ischemic infarctions. No definite acute infarction is seen. No evidence of intracranial hemorrhage. Marked brain parenchymal volume loss and deep white matter microangiopathy. Electronically Signed    By: Fidela Salisbury M.D.   On: 02/06/2018 17:41   Vas [place] Lower Extremity Venous (dvt) (only [facility])  Result Date: 02/08/2018  Lower Venous Study Indications: Pain, and Swelling.  Risk Factors: Surgery Rt total hip Atrial fibrillation on Eliquis. Performing Technologist: Toma Copier RVS  Examination Guidelines: A complete evaluation includes B-mode imaging, spectral Doppler, color Doppler, and power Doppler as needed of all accessible portions of each vessel. Bilateral testing is considered an integral part of a complete examination. Limited examinations for reoccurring indications may be performed as noted.  Right Venous Findings: +---------+---------------+---------+-----------+----------+-------------------+          CompressibilityPhasicitySpontaneityPropertiesSummary             +---------+---------------+---------+-----------+----------+-------------------+ CFV      Full           Yes      Yes                                      +---------+---------------+---------+-----------+----------+-------------------+  SFJ      Full                                                             +---------+---------------+---------+-----------+----------+-------------------+ FV Prox  Full           Yes      Yes                                      +---------+---------------+---------+-----------+----------+-------------------+ FV Mid   Full                                                             +---------+---------------+---------+-----------+----------+-------------------+ FV DistalFull           Yes      Yes                                      +---------+---------------+---------+-----------+----------+-------------------+ PFV      Full           Yes      Yes                                      +---------+---------------+---------+-----------+----------+-------------------+ POP      Full           Yes      Yes                                       +---------+---------------+---------+-----------+----------+-------------------+ PTV      Full                                                             +---------+---------------+---------+-----------+----------+-------------------+ PERO     Full                                         Difficult to image                                                        patient unable to  rotate the leg for                                                        optimal imaging     +---------+---------------+---------+-----------+----------+-------------------+  Left Venous Findings: +---+---------------+---------+-----------+----------+-------+    CompressibilityPhasicitySpontaneityPropertiesSummary +---+---------------+---------+-----------+----------+-------+ CFVFull           Yes      Yes                          +---+---------------+---------+-----------+----------+-------+ SFJFull                                                 +---+---------------+---------+-----------+----------+-------+    Summary: Right: There is no evidence of deep vein thrombosis in the lower extremity. No cystic structure found in the popliteal fossa. Left: There is no evidence of deep vein thrombosis in the lower extremity. No cystic structure found in the popliteal fossa.  *See table(s) above for measurements and observations. Electronically signed by Curt Jews MD on 02/08/2018 at 8:51:44 AM.    Final     Scheduled Meds: . apixaban  5 mg Oral BID  . cholecalciferol  5,000 Units Oral QHS  . feeding supplement (ENSURE ENLIVE)  237 mL Oral BID BM  . polyethylene glycol  17 g Oral Daily  . tamsulosin  0.4 mg Oral QPC breakfast    Admission status: Observation  Time spent: 20 min  [facility] Pager [phone]. If 7PM-7AM, please contact night-coverage at [email or web address], Office  [phone]   password [email or web address]  02/08/2018, 11:40 AM  LOS: 0 days

## 2018-02-09 ENCOUNTER — Ambulatory Visit: Payer: Self-pay | Admitting: *Deleted

## 2018-02-09 ENCOUNTER — Observation Stay (HOSPITAL_COMMUNITY)
Admit: 2018-02-09 | Discharge: 2018-02-09 | Disposition: A | Discharge status: Home / Self Care | Payer: PPO | Attending: Internal Medicine | Admitting: Internal Medicine

## 2018-02-09 DIAGNOSIS — I951 Orthostatic hypotension: Principal | ICD-10-CM

## 2018-02-09 DIAGNOSIS — H9190 Unspecified hearing loss, unspecified ear: Secondary | ICD-10-CM | POA: Diagnosis present

## 2018-02-09 DIAGNOSIS — G3184 Mild cognitive impairment, so stated: Secondary | ICD-10-CM | POA: Diagnosis present

## 2018-02-09 DIAGNOSIS — E860 Dehydration: Secondary | ICD-10-CM | POA: Diagnosis present

## 2018-02-09 DIAGNOSIS — E785 Hyperlipidemia, unspecified: Secondary | ICD-10-CM | POA: Diagnosis present

## 2018-02-09 DIAGNOSIS — I4891 Unspecified atrial fibrillation: Secondary | ICD-10-CM | POA: Diagnosis not present

## 2018-02-09 DIAGNOSIS — I129 Hypertensive chronic kidney disease with stage 1 through stage 4 chronic kidney disease, or unspecified chronic kidney disease: Secondary | ICD-10-CM | POA: Diagnosis present

## 2018-02-09 DIAGNOSIS — D631 Anemia in chronic kidney disease: Secondary | ICD-10-CM | POA: Diagnosis present

## 2018-02-09 DIAGNOSIS — N400 Enlarged prostate without lower urinary tract symptoms: Secondary | ICD-10-CM | POA: Diagnosis present

## 2018-02-09 DIAGNOSIS — Z7901 Long term (current) use of anticoagulants: Secondary | ICD-10-CM | POA: Diagnosis not present

## 2018-02-09 DIAGNOSIS — Z808 Family history of malignant neoplasm of other organs or systems: Secondary | ICD-10-CM | POA: Diagnosis not present

## 2018-02-09 DIAGNOSIS — I69351 Hemiplegia and hemiparesis following cerebral infarction affecting right dominant side: Secondary | ICD-10-CM | POA: Diagnosis not present

## 2018-02-09 DIAGNOSIS — Z881 Allergy status to other antibiotic agents status: Secondary | ICD-10-CM | POA: Diagnosis not present

## 2018-02-09 DIAGNOSIS — Z85528 Personal history of other malignant neoplasm of kidney: Secondary | ICD-10-CM | POA: Diagnosis not present

## 2018-02-09 DIAGNOSIS — Z8711 Personal history of peptic ulcer disease: Secondary | ICD-10-CM | POA: Diagnosis not present

## 2018-02-09 DIAGNOSIS — I69354 Hemiplegia and hemiparesis following cerebral infarction affecting left non-dominant side: Secondary | ICD-10-CM | POA: Diagnosis not present

## 2018-02-09 DIAGNOSIS — K449 Diaphragmatic hernia without obstruction or gangrene: Secondary | ICD-10-CM | POA: Diagnosis present

## 2018-02-09 DIAGNOSIS — K219 Gastro-esophageal reflux disease without esophagitis: Secondary | ICD-10-CM | POA: Diagnosis present

## 2018-02-09 DIAGNOSIS — I480 Paroxysmal atrial fibrillation: Secondary | ICD-10-CM | POA: Diagnosis present

## 2018-02-09 DIAGNOSIS — Z87891 Personal history of nicotine dependence: Secondary | ICD-10-CM | POA: Diagnosis not present

## 2018-02-09 DIAGNOSIS — Z820 Family history of epilepsy and other diseases of the nervous system: Secondary | ICD-10-CM | POA: Diagnosis not present

## 2018-02-09 DIAGNOSIS — R55 Syncope and collapse: Secondary | ICD-10-CM | POA: Diagnosis present

## 2018-02-09 DIAGNOSIS — Z96643 Presence of artificial hip joint, bilateral: Secondary | ICD-10-CM | POA: Diagnosis present

## 2018-02-09 DIAGNOSIS — N183 Chronic kidney disease, stage 3 (moderate): Secondary | ICD-10-CM | POA: Diagnosis present

## 2018-02-09 DIAGNOSIS — K5730 Diverticulosis of large intestine without perforation or abscess without bleeding: Secondary | ICD-10-CM | POA: Diagnosis present

## 2018-02-09 LAB — MAGNESIUM: Magnesium: 2.3 mg/dL (ref 1.7–2.4)

## 2018-02-09 NOTE — Evaluation (Signed)
Physical Therapy Evaluation Patient Details Name: Jeffrey Hardy MRN: [id number] DOB: Jul 11, 1935 Today's Date: 02/09/2018   History of Present Illness  82 year old male with a history of paroxysmal atrial fibrillation, CVA on Eliquis, history of renal cell carcinoma status post right nephrectomy in 2018, CKD stage III, mild cognitive impairment, right total hip arthroplasty on 02/01/2018 with postoperative anemia   Clinical Impression  Pt admitted with above diagnosis. Pt currently with functional limitations due to the deficits listed below (see PT Problem List).  Pt will benefit from skilled PT to increase their independence and safety with mobility to allow discharge to the venue listed below.  Pt requiring assist for bed mobility and transfers.  Pt's spouse reports she has requested assist from family once pt d/c home however appears slightly overwhelmed with this.  Pt and spouse agreeable to SNF if pt is a candidate.  If d/c home, recommend HHPT.  Pt and spouse report pt has equipment.     Follow Up Recommendations SNF;Supervision/Assistance - 24 hour    Equipment Recommendations       Recommendations for Other Services       Precautions / Restrictions Precautions Precautions: Fall Required Braces or Orthoses: Other Brace/Splint Other Brace/Splint: R AFO Restrictions RLE Weight Bearing: Weight bearing as tolerated      Mobility  Bed Mobility Overal bed mobility: Needs Assistance Bed Mobility: Supine to Sit     Supine to sit: Min assist;HOB elevated     General bed mobility comments: assist for R LE and trunk upright  Transfers Overall transfer level: Needs assistance Equipment used: Rolling walker (2 wheeled) Transfers: Sit to/from Stand Sit to Stand: Min guard;Min assist         General transfer comment: min/guard from elevated bed, has posterior lean against bed to self assist; assist for controlling descent to recliner (used armrests for initial controlled  descent)  Ambulation/Gait Ambulation/Gait assistance: Min guard Gait Distance (Feet): 45 Feet Assistive device: Rolling walker (2 wheeled) Gait Pattern/deviations: Step-through pattern;Trunk flexed;Decreased stride length     General Gait Details: verbal cues for posture and RW positioning, steady with RW, reports mild SOB with ambulation; HR 110 and SPO2 100% room air upon return to room  Stairs            Wheelchair Mobility    Modified Rankin (Stroke Patients Only)       Balance Overall balance assessment: History of Falls;Needs assistance         Standing balance support: Bilateral upper extremity supported;During functional activity Standing balance-Leahy Scale: Poor Standing balance comment: posterior lean upon initial standing, requires RW for UE support                             Pertinent Vitals/Pain Pain Assessment: 0-10 Pain Score: 3  Pain Location: R LE  Pain Descriptors / Indicators: Sore Pain Intervention(s): Limited activity within patient's tolerance;Repositioned;Monitored during session    Home Living Family/patient expects to be discharged to:: Private residence Living Arrangements: Spouse/significant other Available Help at Discharge: Family;Available 24 hours/day Type of Home: House Home Access: Stairs to enter Entrance Stairs-Rails: Right Entrance Stairs-Number of Steps: 2 Home Layout: One level;Laundry or work area in [place]: Environmental consultant - 2 wheels;Bedside commode      Prior Function Level of Independence: Independent         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; was independent prior to s/p R THA last week  Hand Dominance        Extremity/Trunk Assessment        Lower Extremity Assessment Lower Extremity Assessment: RLE deficits/detail;Generalized weakness RLE Deficits / Details: 2/5 DF, has R AFO, hip generally weak and requiring assist for movement against gravity in bed    Cervical /  Trunk Assessment Cervical / Trunk Assessment: Kyphotic  Communication   Communication: No difficulties  Cognition Arousal/Alertness: Awake/alert Behavior During Therapy: WFL for tasks assessed/performed Overall Cognitive Status: Within Functional Limits for tasks assessed                                        General Comments      Exercises     Assessment/Plan    PT Assessment Patient needs continued PT services  PT Problem List Decreased strength;Decreased mobility;Decreased activity tolerance;Decreased balance;Decreased knowledge of use of DME       PT Treatment Interventions DME instruction;Therapeutic activities;Gait training;Therapeutic exercise;Patient/family education;Stair training;Balance training;Functional mobility training    PT Goals (Current goals can be found in the Care Plan section)  Acute Rehab PT Goals PT Goal Formulation: With patient/family Time For Goal Achievement: 02/23/18 Potential to Achieve Goals: Fair    Frequency Min 3X/week   Barriers to discharge        Co-evaluation               AM-PAC PT "6 Clicks" Daily Activity  Outcome Measure Difficulty turning over in bed (including adjusting bedclothes, sheets and blankets)?: A Lot Difficulty moving from lying on back to sitting on the side of the bed? : Unable Difficulty sitting down on and standing up from a chair with arms (e.g., wheelchair, bedside commode, etc,.)?: Unable Help needed moving to and from a bed to chair (including a wheelchair)?: A Little Help needed walking in hospital room?: A Little Help needed climbing 3-5 steps with a railing? : A Lot 6 Click Score: 12    End of Session Equipment Utilized During Treatment: Gait belt Activity Tolerance: Patient tolerated treatment well Patient left: in chair;with call bell/phone within reach;with family/visitor present;with nursing/sitter in room Nurse Communication: Mobility status PT Visit Diagnosis: Other  abnormalities of gait and mobility (R26.89);Muscle weakness (generalized) (M62.81)    Time: 9562-1308 PT Time Calculation (min) (ACUTE ONLY): 33 min   Charges:   PT Evaluation $PT Eval Low Complexity: 1 Low PT Treatments $Gait Training: 8-22 mins       Carmelia Bake, PT, DPT Acute Rehabilitation Services Office: [phone] Pager: [phone]  Trena Platt 02/09/2018, 12:45 PM

## 2018-02-09 NOTE — Progress Notes (Signed)
[facility]  PROGRESS NOTE  Jeffrey Hardy NLZ:[id number] DOB: 1935/09/30 DOA: 02/06/2018 PCP: Biagio Borg, MD   Brief HPI:   82 year old male with a history of paroxysmal atrial fibrillation, CVA on Eliquis, history of renal cell carcinoma status post right nephrectomy in 2018, CKD stage III, mild cognitive impairment, right total hip arthroplasty on 02/01/2018 with postoperative anemia was brought to hospital after patient was found to be unresponsive for about 20 minutes as per patient's wife.  Per wife patient was sitting in chair for about 20 minutes at that time her blood pressure was 80/50 and by the time EMS came patient was back to his baseline mentally.     Subjective   Patient seen and examined, orthostatic vital signs are positive for postural hypotension.  This morning also had 19 beats runs of nonsustained V. tach/wide QRS complex tachycardia   Assessment/Plan:     1. Syncope-unclear etiology, CT head is negative.  UA is clear, echocardiogram shows grade 1 diastolic dysfunction, EF 90%.  EEG is done result is pending.  Telemetry shows nonsustained V. tach rhythm.  Serum magnesium was rechecked this morning which was 2.3.  Will consult cardiology for further recommendations.  2. Postural hypotension-orthostatic vital signs are positive for postural hypotension, start bilateral TED hose.  PT OT evaluation  3. Postoperative anemia-hemoglobin has been stable around 9.  We will continue to monitor hemoglobin in the hospital.  4. Status post right total hip arthroplasty-follow-up orthopedics as outpatient  5. Paroxysmal atrial fibrillation-patient has prior history of stroke, continue Eliquis.  6. CKD stage III, history of right nephrectomy-creatinine at baseline  7. BPH-continue Flomax       CBC: Recent Labs  Lab 02/03/18 0420 02/04/18 0332 02/06/18 1207 02/07/18 0417 02/08/18 0509  WBC 6.3 6.5 8.6 6.0 6.6  NEUTROABS  --   --  7.4 4.4  --   HGB 9.1*  9.1* 9.8* 9.0* 8.6*  HCT 27.6* 27.5* 30.3* 28.5* 28.4*  MCV 92.9 92.3 97.4 98.3 102.2*  PLT 97* 109* 157 154 240    Basic Metabolic Panel: Recent Labs  Lab 02/03/18 0420 02/04/18 0332 02/06/18 1207 02/07/18 0417 02/08/18 0509 02/09/18 1019  NA 139 137 138 139 141  --   K 4.2 4.0 4.6 4.2 4.3  --   CL 108 106 106 110 110  --   CO2 24 23 26 23 24   --   GLUCOSE 102* 112* 148* 107* 102*  --   BUN 36* 31* 36* 34* 33*  --   CREATININE 2.01* 1.74* 1.91* 1.88* 1.74*  --   CALCIUM 8.2* 8.3* 8.6* 8.2* 8.5*  --   MG  --   --   --   --   --  2.3     DVT prophylaxis: Eliquis  Code Status: Full code  Family Communication: Discussed with patient's wife at bedside  Disposition Plan: likely home when medically ready for discharge   Consultants:  None  Procedures:  None   Antibiotics:   Anti-infectives (From admission, onward)   None       Objective   Vitals:   02/09/18 0552 02/09/18 0557 02/09/18 0559 02/09/18 1303  BP: 119/75 118/77 120/74 115/82  Pulse: 69 87 96 69  Resp: 16   16  Temp: 98.3 F (36.8 C)   98.6 F (37 C)  TempSrc: Oral   Oral  SpO2: 99% 99% 91% 99%  Weight:      Height:  Intake/Output Summary (Last 24 hours) at 02/09/2018 1506 Last data filed at 02/09/2018 1130 Gross per 24 hour  Intake 240 ml  Output 800 ml  Net -560 ml   Filed Weights   02/06/18 1130 02/06/18 1811  Weight: 87 kg 87.5 kg     Physical Examination:    Neck: Supple, no deformities, masses, or tenderness Lungs: Normal respiratory effort, bilateral clear to auscultation, no crackles or wheezes.  Heart: Regular rate and rhythm, S1 and S2 normal, no murmurs, rubs auscultated Abdomen: BS normoactive,soft,nondistended,non-tender to palpation,no organomegaly Extremities: No pretibial edema, no erythema, no cyanosis, no clubbing Neuro : Alert and oriented to time, place and person, No focal deficits Skin: No rashes seen on exam    Data Reviewed: I have  personally reviewed following labs and imaging studies   No results found for this or any previous visit (from the past 240 hour(s)).   Liver Function Tests: Recent Labs  Lab 02/06/18 1207 02/07/18 0417  AST 40 27  ALT 56* 43  ALKPHOS 54 48  BILITOT 0.6 0.7  PROT 6.0* 5.2*  ALBUMIN 3.3* 3.0*   No results for input(s): LIPASE, AMYLASE in the last 168 hours. No results for input(s): AMMONIA in the last 168 hours.  Cardiac Enzymes: No results for input(s): CKTOTAL, CKMB, CKMBINDEX, TROPONINI in the last 168 hours. BNP (last 3 results) No results for input(s): BNP in the last 8760 hours.  ProBNP (last 3 results) No results for input(s): PROBNP in the last 8760 hours.    Studies: No results found.  Scheduled Meds: . apixaban  5 mg Oral BID  . cholecalciferol  5,000 Units Oral QHS  . feeding supplement (ENSURE ENLIVE)  237 mL Oral BID BM  . polyethylene glycol  17 g Oral Daily  . tamsulosin  0.4 mg Oral QPC breakfast    Admission status: Observation  Time spent: 20 min  [facility] Pager [phone]. If 7PM-7AM, please contact night-coverage at [email or web address], Office  [phone]  password [email or web address]  02/09/2018, 3:06 PM  LOS: 0 days

## 2018-02-09 NOTE — Procedures (Signed)
ELECTROENCEPHALOGRAM REPORT   Patient: Jeffrey Hardy       Room #: 8616 EEG No. ID: [id number] Age: 82 y.o.        Sex: male Referring Physician: Erlinda Hong Report Date:  02/09/2018        Interpreting Physician: Alexis Goodell  History: Jeffrey Hardy is an 82 y.o. male with an episode of unresponsiveness  Medications:  Eliquis, Vitamin D, Miralax, Flomax  Conditions of Recording:  This is a 21 channel routine scalp EEG performed with bipolar and monopolar montages arranged in accordance to the international 10/20 system of electrode placement. One channel was dedicated to EKG recording.  The patient is in the awake and drowsy states.  Description:  The waking background activity consists of a low voltage, symmetrical, fairly well organized, 9-10 Hz alpha activity, seen from the parieto-occipital and posterior temporal regions.  Low voltage fast activity, poorly organized, is seen anteriorly and is at times superimposed on more posterior regions.  A mixture of theta and alpha rhythms are seen from the central and temporal regions. The patient drowses with slowing to irregular, low voltage theta and beta activity.   Stage II sleep is not obtained. No epileptiform activity is noted.   Hyperventilation and intermittent photic stimulation were not performed.   IMPRESSION: Normal electroencephalogram, awake and drowsy. There are no focal lateralizing or epileptiform features.   Alexis Goodell, MD Neurology [phone] 02/09/2018, 3:25 PM

## 2018-02-09 NOTE — Progress Notes (Signed)
Patient sees [facility]. Requested them to see the patient.   Nigel Mormon, MD [facility] Pager: [phone] Office: [phone] If no answer Cell [phone]

## 2018-02-09 NOTE — Telephone Encounter (Signed)
Tried calling -pt again no answer LMOM to schedule hosp f/u w/PCP ASAP. Closing encounter.Marland KitchenJohny Chess

## 2018-02-09 NOTE — Progress Notes (Signed)
EEG Completed; Results Pending  

## 2018-02-10 DIAGNOSIS — I4891 Unspecified atrial fibrillation: Secondary | ICD-10-CM

## 2018-02-10 LAB — OCCULT BLOOD X 1 CARD TO LAB, STOOL: Fecal Occult Bld: NEGATIVE

## 2018-02-10 MED ORDER — HYDROCODONE-ACETAMINOPHEN 5-325 MG PO TABS: 1.0000 | ORAL_TABLET | ORAL | 0 refills | Status: DC | PRN

## 2018-02-10 NOTE — Plan of Care (Signed)
  Problem: Education: Goal: Knowledge of General Education information will improve Description: Including pain rating scale, medication(s)/side effects and non-pharmacologic comfort measures Outcome: Progressing   Problem: Health Behavior/Discharge Planning: Goal: Ability to manage health-related needs will improve Outcome: Progressing   Problem: Clinical Measurements: Goal: Ability to maintain clinical measurements within normal limits will improve Outcome: Progressing Goal: Diagnostic test results will improve Outcome: Progressing   Problem: Activity: Goal: Risk for activity intolerance will decrease Outcome: Progressing   Problem: Nutrition: Goal: Adequate nutrition will be maintained Outcome: Progressing   Problem: Pain Managment: Goal: General experience of comfort will improve Outcome: Progressing   Problem: Skin Integrity: Goal: Risk for impaired skin integrity will decrease Outcome: Progressing   

## 2018-02-10 NOTE — Discharge Summary (Signed)
Physician Discharge Summary  Jeffrey Hardy BZJ:[id number] DOB: 1935-04-28 DOA: 02/06/2018  PCP: Biagio Borg, MD  Admit date: 02/06/2018 Discharge date: 02/11/2018  Time spent: 40* minutes  Recommendations for Outpatient Follow-up:  1. Follow up cardiology in 2 weeks 2.   Discharge Diagnoses:  Active Problems:   Syncope   Discharge Condition: Stable  Diet recommendation: heart healthy diet  Filed Weights   02/06/18 1130 02/06/18 1811  Weight: 87 kg 87.5 kg    History of present illness:  82 year old male with a history of paroxysmal atrial fibrillation, CVA on Eliquis, history of renal cell carcinoma status post right nephrectomy in 2018, CKD stage III, mild cognitive impairment, right total hip arthroplasty on 02/01/2018 with postoperative anemia was brought to hospital after patient was found to be unresponsive for about 20 minutes as per patient's wife.  Per wife patient was sitting in chair for about 20 minutes at that time her blood pressure was 80/50 and by the time EMS came patient was back to his baseline mentally.   Hospital Course:  1. Syncope-unclear etiology, CT head is negative.  UA is clear, echocardiogram shows grade 1 diastolic dysfunction, EF 01%.  EEG is negative epileptic focus.  Telemetry shows nonsustained V. tach rhythm.  Serum magnesium was rechecked this morning which was 2.3.    Cardiology was consulted and no new recommendations.  Patient to follow-up with cardiology in 2 weeks and EP in 3 weeks.  2. Postural hypotension-orthostatic vital signs are positive for postural hypotension, start bilateral TED hose.  PT OT evaluation was done, patient to go to skilled facility for rehab.  3. Postoperative anemia-hemoglobin has been stable around 9.   4. Status post right total hip arthroplasty-follow-up orthopedics as outpatient  5. Paroxysmal atrial fibrillation-patient has prior history of stroke, continue Eliquis.  6. CKD stage III, history of right  nephrectomy-creatinine at baseline  7. BPH-continue Flomax  Procedures:  Echocardiogram  Consultations:  Cardiology  Discharge Exam: Vitals:   02/10/18 0536 02/10/18 1300  BP: 133/68 129/73  Pulse: 71 77  Resp: 14 16  Temp: 98.4 F (36.9 C) 98.7 F (37.1 C)  SpO2: 96% 97%    General: Appears in no acute distress Cardiovascular: S1-S2, regular Respiratory: Clear to auscultation bilaterally  Discharge Instructions   Discharge Instructions    Diet - low sodium heart healthy   Complete by:  As directed    Increase activity slowly   Complete by:  As directed      Allergies as of 02/10/2018      Reactions   Acyclovir And Related Other (See Comments)   Unsure of what happens      Medication List    STOP taking these medications   Trospium Chloride 60 MG Cp24     TAKE these medications   baclofen 10 MG tablet Commonly known as:  LIORESAL Take 10 mg by mouth daily as needed (hiccups).   ELIQUIS 5 MG Tabs tablet Generic drug:  apixaban Take 5 mg by mouth 2 (two) times daily.   feeding supplement (ENSURE ENLIVE) Liqd Take 237 mLs by mouth 2 (two) times daily between meals.   ferrous sulfate 325 (65 FE) MG tablet Take 325 mg by mouth daily with breakfast.   HYDROcodone-acetaminophen 5-325 MG tablet Commonly known as:  NORCO/VICODIN Take 1-2 tablets by mouth every 4 (four) hours as needed for moderate pain (pain score 4-6).   polyethylene glycol packet Commonly known as:  MIRALAX / GLYCOLAX Take 17 g by  mouth daily.   senna-docusate 8.6-50 MG tablet Commonly known as:  Senokot-S Take 1 tablet by mouth at bedtime as needed for mild constipation or moderate constipation.   tamsulosin 0.4 MG Caps capsule Commonly known as:  FLOMAX Take 1 capsule (0.4 mg total) by mouth daily after breakfast.   Vitamin D3 5000 units Caps Take 1 capsule by mouth daily. What changed:  Another medication with the same name was removed. Continue taking this medication,  and follow the directions you see here.      Allergies  Allergen Reactions  . Acyclovir And Related Other (See Comments)    Unsure of what happens      The results of significant diagnostics from this hospitalization (including imaging, microbiology, ancillary and laboratory) are listed below for reference.    Significant Diagnostic Studies: Ct Head Wo Contrast  Result Date: 02/06/2018 CLINICAL DATA:  Altered mental status. EXAM: CT HEAD WITHOUT CONTRAST TECHNIQUE: Contiguous axial images were obtained from the base of the skull through the vertex without intravenous contrast. COMPARISON:  Brain MRI 01/04/2012, head CT 01/04/2012 FINDINGS: Brain: No evidence of acute infarction, hemorrhage, hydrocephalus, extra-axial collection or mass lesion/mass effect. Areas of encephalomalacia in bilateral frontal lobes, left parietal lobe and left occipital lobe from prior ischemic infarctions. Marked brain parenchymal volume loss and deep white matter microangiopathy. Vascular: Calcific atherosclerotic disease at the skull base. Skull: Normal. Negative for fracture or focal lesion. Sinuses/Orbits: No acute finding. Other: None. IMPRESSION: Areas of encephalomalacia in bilateral frontal lobes, left parietal lobe and left occipital lobe, from prior ischemic infarctions. No definite acute infarction is seen. No evidence of intracranial hemorrhage. Marked brain parenchymal volume loss and deep white matter microangiopathy. Electronically Signed   By: Fidela Salisbury M.D.   On: 02/06/2018 17:41   Pelvis Portable  Result Date: 02/01/2018 CLINICAL DATA:  Status post right hip replacement. EXAM: PORTABLE PELVIS 1-2 VIEWS COMPARISON:  C-arm images obtained earlier today. FINDINGS: Bilateral hip prostheses in satisfactory position and alignment. No fracture or dislocation. Lower lumbar spine degenerative changes. IMPRESSION: Satisfactory postoperative appearance of the right hip prosthesis. Electronically  Signed   By: Claudie Revering M.D.   On: 02/01/2018 15:31   Dg Chest Port 1 View  Result Date: 01/29/2018 CLINICAL DATA:  82 y/o  M; left hip fracture today. Former smoker. EXAM: PORTABLE CHEST 1 VIEW COMPARISON:  10/23/2017 chest radiograph. FINDINGS: The heart size and mediastinal contours are within normal limits. Loop recorder device noted. Both lungs are clear. The visualized skeletal structures are unremarkable. IMPRESSION: No active disease. Electronically Signed   By: Kristine Garbe M.D.   On: 01/29/2018 20:04   Dg C-arm 1-60 Min  Result Date: 02/01/2018 CLINICAL DATA:  Anterior approach right total hip arthroplasty EXAM: DG C-ARM 61-120 MIN; OPERATIVE RIGHT HIP WITH PELVIS FLUOROSCOPY TIME:  34 seconds COMPARISON:  Right hip radiographs-01/29/2018 FINDINGS: Two spot intraoperative fluoroscopic images of the right hip and lower pelvis are provided for review. Images demonstrate the sequela of interval right total hip replacement with cerclage fixation of the proximal femur. Alignment appears anatomic given AP projection. There is a minimal amount of subcutaneous emphysema about the operative site. No radiopaque foreign body. Stable sequela of left total hip replacement, incompletely evaluated. IMPRESSION: Post right total hip replacement with cerclage fixation of the proximal femur as detailed above. Electronically Signed   By: Sandi Mariscal M.D.   On: 02/01/2018 14:23   Dg C-arm 1-60 Min  Result Date: 02/01/2018 CLINICAL DATA:  Anterior  approach right total hip arthroplasty EXAM: DG C-ARM 61-120 MIN; OPERATIVE RIGHT HIP WITH PELVIS FLUOROSCOPY TIME:  34 seconds COMPARISON:  Right hip radiographs-01/29/2018 FINDINGS: Two spot intraoperative fluoroscopic images of the right hip and lower pelvis are provided for review. Images demonstrate the sequela of interval right total hip replacement with cerclage fixation of the proximal femur. Alignment appears anatomic given AP projection. There is  a minimal amount of subcutaneous emphysema about the operative site. No radiopaque foreign body. Stable sequela of left total hip replacement, incompletely evaluated. IMPRESSION: Post right total hip replacement with cerclage fixation of the proximal femur as detailed above. Electronically Signed   By: Sandi Mariscal M.D.   On: 02/01/2018 14:23   Dg Hip Operative Unilat W Or W/o Pelvis Right  Result Date: 02/01/2018 CLINICAL DATA:  Anterior approach right total hip arthroplasty EXAM: DG C-ARM 61-120 MIN; OPERATIVE RIGHT HIP WITH PELVIS FLUOROSCOPY TIME:  34 seconds COMPARISON:  Right hip radiographs-01/29/2018 FINDINGS: Two spot intraoperative fluoroscopic images of the right hip and lower pelvis are provided for review. Images demonstrate the sequela of interval right total hip replacement with cerclage fixation of the proximal femur. Alignment appears anatomic given AP projection. There is a minimal amount of subcutaneous emphysema about the operative site. No radiopaque foreign body. Stable sequela of left total hip replacement, incompletely evaluated. IMPRESSION: Post right total hip replacement with cerclage fixation of the proximal femur as detailed above. Electronically Signed   By: Sandi Mariscal M.D.   On: 02/01/2018 14:23   Dg Hip Unilat W Or Wo Pelvis 2-3 Views Right  Result Date: 01/29/2018 CLINICAL DATA:  Fall with hip pain EXAM: DG HIP (WITH OR WITHOUT PELVIS) 2-3V RIGHT COMPARISON:  08/14/2017 FINDINGS: Status post left hip replacement with normal alignment and intact hardware. Pubic symphysis is intact. Rami are without acute displaced fracture. Acute mildly displaced right subcapital femoral neck fracture. No femoral head dislocation. IMPRESSION: 1. Acute right subcapital femoral neck fracture 2. Prior left hip replacement with normal alignment. Electronically Signed   By: Donavan Foil M.D.   On: 01/29/2018 19:19   Vas [place] Lower Extremity Venous (dvt) (only [facility])  Result Date: 02/08/2018   Lower Venous Study Indications: Pain, and Swelling.  Risk Factors: Surgery Rt total hip Atrial fibrillation on Eliquis. Performing Technologist: Toma Copier RVS  Examination Guidelines: A complete evaluation includes B-mode imaging, spectral Doppler, color Doppler, and power Doppler as needed of all accessible portions of each vessel. Bilateral testing is considered an integral part of a complete examination. Limited examinations for reoccurring indications may be performed as noted.  Right Venous Findings: +---------+---------------+---------+-----------+----------+-------------------+          CompressibilityPhasicitySpontaneityPropertiesSummary             +---------+---------------+---------+-----------+----------+-------------------+ CFV      Full           Yes      Yes                                      +---------+---------------+---------+-----------+----------+-------------------+ SFJ      Full                                                             +---------+---------------+---------+-----------+----------+-------------------+  FV Prox  Full           Yes      Yes                                      +---------+---------------+---------+-----------+----------+-------------------+ FV Mid   Full                                                             +---------+---------------+---------+-----------+----------+-------------------+ FV DistalFull           Yes      Yes                                      +---------+---------------+---------+-----------+----------+-------------------+ PFV      Full           Yes      Yes                                      +---------+---------------+---------+-----------+----------+-------------------+ POP      Full           Yes      Yes                                      +---------+---------------+---------+-----------+----------+-------------------+ PTV      Full                                                              +---------+---------------+---------+-----------+----------+-------------------+ PERO     Full                                         Difficult to image                                                        patient unable to                                                         rotate the leg for                                                        optimal imaging     +---------+---------------+---------+-----------+----------+-------------------+  Left Venous Findings: +---+---------------+---------+-----------+----------+-------+    CompressibilityPhasicitySpontaneityPropertiesSummary +---+---------------+---------+-----------+----------+-------+ CFVFull           Yes      Yes                          +---+---------------+---------+-----------+----------+-------+ SFJFull                                                 +---+---------------+---------+-----------+----------+-------+    Summary: Right: There is no evidence of deep vein thrombosis in the lower extremity. No cystic structure found in the popliteal fossa. Left: There is no evidence of deep vein thrombosis in the lower extremity. No cystic structure found in the popliteal fossa.  *See table(s) above for measurements and observations. Electronically signed by Curt Jews MD on 02/08/2018 at 8:51:44 AM.    Final     Microbiology: No results found for this or any previous visit (from the past 240 hour(s)).   Labs: Basic Metabolic Panel: Recent Labs  Lab 02/04/18 0332 02/06/18 1207 02/07/18 0417 02/08/18 0509 02/09/18 1019  NA 137 138 139 141  --   K 4.0 4.6 4.2 4.3  --   CL 106 106 110 110  --   CO2 23 26 23 24   --   GLUCOSE 112* 148* 107* 102*  --   BUN 31* 36* 34* 33*  --   CREATININE 1.74* 1.91* 1.88* 1.74*  --   CALCIUM 8.3* 8.6* 8.2* 8.5*  --   MG  --   --   --   --  2.3   Liver Function Tests: Recent Labs  Lab 02/06/18 1207 02/07/18 0417  AST  40 27  ALT 56* 43  ALKPHOS 54 48  BILITOT 0.6 0.7  PROT 6.0* 5.2*  ALBUMIN 3.3* 3.0*   No results for input(s): LIPASE, AMYLASE in the last 168 hours. No results for input(s): AMMONIA in the last 168 hours. CBC: Recent Labs  Lab 02/04/18 0332 02/06/18 1207 02/07/18 0417 02/08/18 0509  WBC 6.5 8.6 6.0 6.6  NEUTROABS  --  7.4 4.4  --   HGB 9.1* 9.8* 9.0* 8.6*  HCT 27.5* 30.3* 28.5* 28.4*  MCV 92.3 97.4 98.3 102.2*  PLT 109* 157 154 165       Signed:  Oswald Hillock MD.  [facility] 02/10/2018, 2:57 PM

## 2018-02-10 NOTE — Progress Notes (Signed)
Physical Therapy Treatment Patient Details Name: Jeffrey Hardy MRN: [id number] DOB: 03/20/1936 Today's Date: 02/10/2018    History of Present Illness 82 year old male with a history of paroxysmal atrial fibrillation, CVA on Eliquis, history of renal cell carcinoma status post right nephrectomy in 2018, CKD stage III, mild cognitive impairment, right total hip arthroplasty on 02/01/2018 with postoperative anemia     PT Comments    Patient progressing with ambulation distance, but increased help needed to stand after seated in recliner for some time.  Noted LOB when assisted to lift head upright, pt seems visually reliant for balance.  Feel continued skilled PT indicated in acute setting and will need STSNF for rehab prior to d/c home.   Follow Up Recommendations  SNF;Supervision/Assistance - 24 hour     Equipment Recommendations  None recommended by PT    Recommendations for Other Services       Precautions / Restrictions Precautions Precautions: Fall Required Braces or Orthoses: Other Brace/Splint Other Brace/Splint: R AFO Restrictions RLE Weight Bearing: Weight bearing as tolerated    Mobility  Bed Mobility               General bed mobility comments: up in chair  Transfers Overall transfer level: Needs assistance Equipment used: Rolling walker (2 wheeled) Transfers: Sit to/from Stand Sit to Stand: Mod assist         General transfer comment: lifting help and posterior bias up from recliner with cues for hand placement, and positioning near edge of chair  Ambulation/Gait Ambulation/Gait assistance: Min assist;Min guard Gait Distance (Feet): 100 Feet Assistive device: Rolling walker (2 wheeled) Gait Pattern/deviations: Step-to pattern;Step-through pattern;Decreased stride length;Trunk flexed     General Gait Details: mild steppage pattern on R and very visually reliant for balance as when cued to look up pt with LOB and min A for recovery, but able to look  up at times without LOB   Stairs             Wheelchair Mobility    Modified Rankin (Stroke Patients Only)       Balance Overall balance assessment: History of Falls;Needs assistance Sitting-balance support: Feet supported Sitting balance-Leahy Scale: Poor Sitting balance - Comments: leans posterior in sitting, reliant on chair for balance in seat with knees higher than his hips Postural control: Posterior lean Standing balance support: Bilateral upper extremity supported Standing balance-Leahy Scale: Poor Standing balance comment: needs UE support for balance with posterior bias especially with standing initially                            Cognition Arousal/Alertness: Awake/alert Behavior During Therapy: WFL for tasks assessed/performed Overall Cognitive Status: Within Functional Limits for tasks assessed                                        Exercises Total Joint Exercises Ankle Circles/Pumps: AROM;Both;10 reps;Seated Heel Slides: AAROM;10 reps;AROM;Both;Seated Hip ABduction/ADduction: AROM;10 reps;Both;Seated Long Arc Quad: AROM;10 reps;Both;Seated    General Comments General comments (skin integrity, edema, etc.): SpO2 96%, HR 92 bpm after ambulation      Pertinent Vitals/Pain Faces Pain Scale: Hurts a little bit Pain Location: R LE  Pain Descriptors / Indicators: Sore;Tightness Pain Intervention(s): Monitored during session;Repositioned    Home Living  Prior Function            PT Goals (current goals can now be found in the care plan section) Progress towards PT goals: Progressing toward goals    Frequency    Min 3X/week      PT Plan Current plan remains appropriate    Co-evaluation              AM-PAC PT "6 Clicks" Daily Activity  Outcome Measure  Difficulty turning over in bed (including adjusting bedclothes, sheets and blankets)?: Unable Difficulty moving from lying on  back to sitting on the side of the bed? : Unable Difficulty sitting down on and standing up from a chair with arms (e.g., wheelchair, bedside commode, etc,.)?: Unable Help needed moving to and from a bed to chair (including a wheelchair)?: A Lot Help needed walking in hospital room?: A Little Help needed climbing 3-5 steps with a railing? : A Lot 6 Click Score: 10    End of Session Equipment Utilized During Treatment: Gait belt Activity Tolerance: Patient tolerated treatment well Patient left: with call bell/phone within reach;in chair;with chair alarm set;with family/visitor present;with nursing/sitter in room   PT Visit Diagnosis: Other abnormalities of gait and mobility (R26.89);Muscle weakness (generalized) (M62.81)     Time: 7741-2878 PT Time Calculation (min) (ACUTE ONLY): 31 min  Charges:  $Gait Training: 8-22 mins $Therapeutic Exercise: 8-22 mins                     Jeffrey Hardy, [place] [facility] [phone] 02/10/2018    Reginia Naas 02/10/2018, 1:13 PM

## 2018-02-10 NOTE — NC FL2 (Signed)
[place] LEVEL OF CARE SCREENING TOOL     IDENTIFICATION  Patient Name: Jeffrey Hardy Birthdate: 17-Oct-1935 Sex: male Admission Date (Current Location): 02/06/2018  [facility] and [place] Number:  Herbalist and Address:  [facility],  [place] [address] [place]      Provider Number: [id number]  Attending Physician Name and Address:  Oswald Hillock, MD  Relative Name and Phone Number:       Current Level of Care: Hospital Recommended Level of Care: [place] Prior Approval Number:    Date Approved/Denied:   PASRR Number: [id number]  Discharge Plan: SNF    Current Diagnoses: Patient Active Problem List   Diagnosis Date Noted  . Syncope 02/06/2018  . Closed displaced fracture of right femoral neck ([place]) 02/01/2018  . PAF (paroxysmal atrial fibrillation) ([place]) 01/30/2018  . Closed right hip fracture, initial encounter ([place]) 01/29/2018  . CKD (chronic kidney disease) stage 3, GFR 30-59 ml/min (HCC) 09/04/2017  . Hip fracture ([place]) 09/04/2017  . Renal cell cancer ([place]) 09/06/2016  . Urinary frequency 03/08/2016  . Memory dysfunction 03/08/2016  . History of colonic polyps 07/07/2015  . Hyperglycemia 07/04/2015  . Cerebrovascular disease, arteriosclerotic, post-stroke 12/28/2013  . Shingles 06/29/2013  . Incontinent of urine 12/29/2012  . Thrombocytopenia ([place]) 01/13/2012  . 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 Preventative health care 07/27/2010  . Hyperlipidemia 05/12/2008  . Essential hypertension 05/12/2008  . GERD 05/12/2008  . DIVERTICULOSIS, COLON 05/12/2008    Orientation RESPIRATION BLADDER Height & Weight     Self, Time, Situation, Place  Normal Continent Weight: 192 lb 12.8 oz (87.5 kg) Height:  6\' 1"  (185.4 cm)  BEHAVIORAL SYMPTOMS/MOOD NEUROLOGICAL BOWEL NUTRITION STATUS      Continent Diet(heart healthy)  AMBULATORY STATUS COMMUNICATION OF NEEDS Skin   Extensive Assist Verbally Surgical  wounds(closed incision hip)                       Personal Care Assistance Level of Assistance  Bathing, Feeding, Dressing Bathing Assistance: Maximum assistance Feeding assistance: Independent Dressing Assistance: Maximum assistance     Functional Limitations Info  Sight, Hearing, Speech Sight Info: Adequate Hearing Info: Adequate Speech Info: Adequate    SPECIAL CARE FACTORS FREQUENCY  PT (By licensed PT), OT (By licensed OT)     PT Frequency: 5x OT Frequency: 5x            Contractures Contractures Info: Not present    Additional Factors Info  Code Status, Allergies Code Status Info: full code Allergies Info: Acyclovir And Related           Current Medications (02/10/2018):  This is the current hospital active medication list Current Facility-Administered Medications  Medication Dose Route Frequency Provider Last Rate Last Dose  . apixaban (ELIQUIS) tablet 5 mg  5 mg Oral BID Florencia Reasons, MD   5 mg at 02/09/18 2137  . baclofen (LIORESAL) tablet 10 mg  10 mg Oral Daily PRN Florencia Reasons, MD   10 mg at 02/09/18 2135  . cholecalciferol (VITAMIN D) tablet 5,000 Units  5,000 Units Oral Vonzell Schlatter, MD   5,000 Units at 02/09/18 2134  . feeding supplement (ENSURE ENLIVE) (ENSURE ENLIVE) liquid 237 mL  237 mL Oral BID BM Florencia Reasons, MD   237 mL at 02/07/18 0921  . HYDROcodone-acetaminophen (NORCO/VICODIN) 5-325 MG per tablet 1-2 tablet  1-2 tablet Oral Q4H PRN Florencia Reasons, MD   1  tablet at 02/09/18 2134  . polyethylene glycol (MIRALAX / GLYCOLAX) packet 17 g  17 g Oral Daily Florencia Reasons, MD   17 g at 02/07/18 0921  . senna-docusate (Senokot-S) tablet 1 tablet  1 tablet Oral QHS PRN Florencia Reasons, MD      . tamsulosin [facility]) capsule 0.4 mg  0.4 mg Oral QPC breakfast Florencia Reasons, MD   0.4 mg at 02/09/18 1121     Discharge Medications: Please see discharge summary for a list of discharge medications.  Relevant Imaging Results:  Relevant Lab Results:   Additional Information SS#  [id number]  Nila Nephew, LCSW

## 2018-02-10 NOTE — Consult Note (Addendum)
Cardiology Consultation:   Patient ID: [name] MRN: [id number]; DOB: January 30, 1936  Admit date: 02/06/2018 Date of Consult: 02/10/2018  Primary Care Provider: Biagio Borg, MD Primary Cardiologist: Dr. Suszanne Finch, NP  Patient Profile:   Jeffrey Hardy is a 82 y.o. male with a hx of Cryptogenic stroke, PAF on Linq, renal cell carcinoma s/p right nephrectomy, CKD stage III, HTN, HLD and mild dementia who is being seen today for the evaluation of syncope at the request of Dr.  Darrick Meigs.   H/o cryptogenic stroke  12/20/11. He had a Linq monitor implanted in 2014. He had been on aggrenox since his stroke in 2013. When his linq showed afib 07/2015,  he was sent  to afib clinic by Dr. [name] to discuss anticoagulation. He was not aware of irregular heart beat. Chadsvasc score is 5.He was started on eliquis and aggrenox was stopped.  He was seen by Dr. [name] 11/09/15 and Linq was ERI and he elected to leave in place. No c/o of irregular heart beat.  Last seen by Roderic Palau 01/21/18. Doing well. Plan to reduce Eliquis to 2.5mg  BID on his 82th birthday later this month.   History of Present Illness:   [name] recently admitted 10/24-10/30 for closed right hip fracture due to mechanical fall. Eliquis was held and he was bridged with IV heparin which was held several hours prior to surgery. S/p right total hip arthroplasty on 02/01/2018. Eliquis was resumed postop. Transfused 1 unit of PRBCs due to post op anemia.   However presented back 02/06/18 for possible syncope.  Per wife, patient was waiting for first home PT visit in his Rollator and suddenly became less responsive and not talking.  Wife said not a goal pulse oximetry reading.  Upon EMS arrival he was nauseous talkative.  Symptoms lasted for approximately 20 to 30 minutes.  Per note, he returned to baseline bradycardic very quickly.  CT of head negative for acute abnormality.  UA clear.  Echocardiogram showed normal LV function at 65% with  grade 1 diastolic dysfunction.  Normal EEG.  Serum creatinine 1.7-1.9. Hgb of 8.6 from 10.3 on 10/28.  EKG shows sinus rhythm at rate of 76 bpm-personally reviewed.  No acute abnormality.  Telemetry shows normal sinus rhythm with 19 beats of wide-complex tachycardia yesterday.  No recurrence.  Patient was asymptomatic. Cardiology is asked for further evaluation.  Past Medical History:  Diagnosis Date  . Anxiety   . Bilateral renal cysts   . Cancer ([place])    renal mass  . Depression   . Diverticulosis of colon   . Erectile dysfunction   . Hematuria   . History of cerebral embolic infarction x2 CVA's ---  neurologist-  [name]--- per pt residual abnormal gait , does need cane   12-16-2011  right frontal embolic infarct and [phone] left frontal embolic infarct (per first MRI previous hemorrgaic cerebral ischemia)  . History of duodenal ulcer    1997  . History of Helicobacter pylori infection    1997  . History of hiatal hernia   . History of loop recorder    Current  . History of syncope    1997  and 2014  s/p  loop recorder  . Hyperlipidemia   . Hypertension   . Nocturia   . PAF (paroxysmal atrial fibrillation) [facility])    cardiologist-  dr [name]  . Renal cell cancer ([place]) 09/06/2016  . Right renal mass   . Status post placement of  implantable loop recorder    07-06-2012  . Stroke [facility])    2013 2 mild strokes mild weakness bil sides  . Urgency of urination   . Wears hearing aid    right ear only    Past Surgical History:  Procedure Laterality Date  . APPENDECTOMY  age 57  . COLONOSCOPY  last one 07-17-2015  . CYSTOSCOPY/RETROGRADE/URETEROSCOPY Right 04/22/2016   Procedure: CYSTOSCOPY/RETROGRADE/URETEROSCOPY/ BIOPSY/ STENT PLACEMENT;  Surgeon: [name], MD;  Location: [facility];  Service: Urology;  Laterality: Right;  . ESOPHAGOGASTRODUODENOSCOPY  last one 09-16-2014  . LOOP RECORDER IMPLANT N/A 07/06/2012   Procedure: LOOP RECORDER IMPLANT;   Surgeon: [name], MD;  Location: [facility] CATH LAB;  Service: Cardiovascular;  Laterality: N/A;  . ROBOT ASSITED LAPAROSCOPIC NEPHROURETERECTOMY Right 05/13/2016   Procedure: XI ROBOT ASSITED LAPAROSCOPIC NEPHROURETERECTOMY;  Surgeon: [name], MD;  Location: [facility];  Service: Urology;  Laterality: Right;  . TEE WITHOUT CARDIOVERSION  12/19/2011   Procedure: TRANSESOPHAGEAL ECHOCARDIOGRAM (TEE);  Surgeon: [name], MD;  Location[facility] ENDOSCOPY;  Service: Cardiovascular;  Laterality: N/A;  no evidence thrombus in the atrial cavity or appendage;  mild AV thickened w/ mild AI;  minimal fixed plaque in thoracic aorta;  mild MV thickened w/ mild MR;  NO pfo by color doppler or with injection of agitated saline  . TOTAL HIP ARTHROPLASTY Left 09/07/2017   Procedure: TOTAL HIP ARTHROPLASTY ANTERIOR APPROACH;  Surgeon: Rod Can, MD;  Location: [facility];  Service: Orthopedics;  Laterality: Left;  . TOTAL HIP ARTHROPLASTY Right 02/01/2018   Procedure: TOTAL HIP ARTHROPLASTY ANTERIOR APPROACH;  Surgeon: Rod Can, MD;  Location: [facility];  Service: Orthopedics;  Laterality: Right;  . TRANSTHORACIC ECHOCARDIOGRAM  09-10-20103   grade 1 diastolic dysfunction,  ef 55-60%/  AV sclerosis without stenosis/  trivial TR  . WRIST FRACTURE SURGERY Right 2014  approx.   retained hardware     Inpatient Medications: Scheduled Meds: . apixaban  5 mg Oral BID  . cholecalciferol  5,000 Units Oral QHS  . feeding supplement (ENSURE ENLIVE)  237 mL Oral BID BM  . polyethylene glycol  17 g Oral Daily  . tamsulosin  0.4 mg Oral QPC breakfast   Continuous Infusions:  PRN Meds: baclofen, HYDROcodone-acetaminophen, senna-docusate  Allergies:    Allergies  Allergen Reactions  . Acyclovir And Related Other (See Comments)    Unsure of what happens    Social History:   Social History   Socioeconomic History  . Marital status: Married    Spouse name: Not on file  . Number of children: 2  . Years of  education: Not on file  . Highest education level: Not on file  Occupational History  . Occupation: retired  Scientific laboratory technician  . Financial resource strain: Not on file  . Food insecurity:    Worry: Not on file    Inability: Not on file  . Transportation needs:    Medical: Not on file    Non-medical: Not on file  Tobacco Use  . Smoking status: Former Smoker    Years: 5.00    Types: Cigarettes, Pipe    Last attempt to quit: 09/07/1966    Years since quitting: 51.4  . Smokeless tobacco: Never Used  Substance and Sexual Activity  . Alcohol use: No    Alcohol/week: 0.0 standard drinks  . Drug use: No  . Sexual activity: Yes  Lifestyle  . Physical activity:    Days per week:  Not on file    Minutes per session: Not on file  . Stress: Not on file  Relationships  . Social connections:    Talks on phone: Not on file    Gets together: Not on file    Attends religious service: Not on file    Active member of club or organization: Not on file    Attends meetings of clubs or organizations: Not on file    Relationship status: Not on file  . Intimate partner violence:    Fear of current or ex partner: Not on file    Emotionally abused: Not on file    Physically abused: Not on file    Forced sexual activity: Not on file  Other Topics Concern  . 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 Patient is retired from [employer]    Family History:    Family History  Problem Relation Age of Onset  . Heart Problems Mother   . Brain cancer Father   . Parkinsonism Brother   . Dementia Brother   . Colon cancer Neg Hx   . Esophageal cancer Neg Hx   . Stomach cancer Neg Hx   . Gastric cancer Neg Hx   . Liver disease Neg Hx   . Kidney disease Neg Hx   . Diabetes Neg Hx      ROS:  Please see the history of present illness.  All other ROS reviewed and negative.     Physical Exam/Data:   Vitals:   02/09/18 0559 02/09/18 1303 02/09/18 2105 02/10/18 0536  BP:  120/74 115/82 119/81 133/68  Pulse: 96 69 79 71  Resp:  16 14 14   Temp:  98.6 F (37 C) 98.1 F (36.7 C) 98.4 F (36.9 C)  TempSrc:  Oral Oral Oral  SpO2: 91% 99% 98% 96%  Weight:      Height:        Intake/Output Summary (Last 24 hours) at 02/10/2018 0800 Last data filed at 02/10/2018 0630 Gross per 24 hour  Intake 840 ml  Output 500 ml  Net 340 ml   Filed Weights   02/06/18 1130 02/06/18 1811  Weight: 87 kg 87.5 kg   Body mass index is 25.44 kg/m.  General:  Well nourished, well developed, in no acute distress HEENT: normal Lymph: no adenopathy Neck: no JVD Endocrine:  No thryomegaly Vascular: No carotid bruits; FA pulses 2+ bilaterally without bruits  Cardiac:  normal S1, S2; RRR; no murmur  Lungs:  clear to auscultation bilaterally, no wheezing, rhonchi or rales  Abd: soft, nontender, no hepatomegaly  Ext: no edema Musculoskeletal:  No deformities, BUE and BLE strength normal and equal Skin: warm and dry  Neuro:  CNs 2-12 intact, no focal abnormalities noted Psych:  Normal affect   Relevant CV Studies:  Echo 02/07/18 Study Conclusions  - Left ventricle: The cavity size was normal. Wall thickness was   normal. Systolic function was normal. The estimated ejection   fraction was in the range of 60% to 65%. Wall motion was normal;   there were no regional wall motion abnormalities. Doppler   parameters are consistent with abnormal left ventricular   relaxation (grade 1 diastolic dysfunction). The E&'e&' ratio is   >15, suggesting elevated LV filling pressure. - Aortic valve: Sclerosis without stenosis. There was no   regurgitation. - Mitral valve: Mildly thickened leaflets . There was trivial   regurgitation. - Left atrium: The atrium was mildly  dilated. - Right atrium: The atrium was normal in size. - Inferior vena cava: The vessel was normal in size. The   respirophasic diameter changes were in the normal range (>= 50%),   consistent with normal central  venous pressure.  Impressions:  - LVEF 60-65%, normal wall thickness, normal wall motion, grade 1   DD, elevated LV filling pressure, mild LAE, normal IVC.  Laboratory Data:  Chemistry Recent Labs  Lab 02/06/18 1207 02/07/18 0417 02/08/18 0509  NA 138 139 141  K 4.6 4.2 4.3  CL 106 110 110  CO2 26 23 24   GLUCOSE 148* 107* 102*  BUN 36* 34* 33*  CREATININE 1.91* 1.88* 1.74*  CALCIUM 8.6* 8.2* 8.5*  GFRNONAA 32* 32* 36*  GFRAA 37* 38* 41*  ANIONGAP 6 6 7     Recent Labs  Lab 02/06/18 1207 02/07/18 0417  PROT 6.0* 5.2*  ALBUMIN 3.3* 3.0*  AST 40 27  ALT 56* 43  ALKPHOS 54 48  BILITOT 0.6 0.7   Hematology Recent Labs  Lab 02/06/18 1207 02/07/18 0417 02/08/18 0509  WBC 8.6 6.0 6.6  RBC 3.11* 2.90* 2.78*  HGB 9.8* 9.0* 8.6*  HCT 30.3* 28.5* 28.4*  MCV 97.4 98.3 102.2*  MCH 31.5 31.0 30.9  MCHC 32.3 31.6 30.3  RDW 14.7 14.9 14.7  PLT 157 154 165   Cardiac EnzymesNo results for input(s): TROPONINI in the last 168 hours. No results for input(s): TROPIPOC in the last 168 hours.  BNPNo results for input(s): BNP, PROBNP in the last 168 hours.  DDimer No results for input(s): DDIMER in the last 168 hours.  Radiology/Studies:  Ct Head Wo Contrast  Result Date: 02/06/2018 CLINICAL DATA:  Altered mental status. EXAM: CT HEAD WITHOUT CONTRAST TECHNIQUE: Contiguous axial images were obtained from the base of the skull through the vertex without intravenous contrast. COMPARISON:  Brain MRI 01/04/2012, head CT 01/04/2012 FINDINGS: Brain: No evidence of acute infarction, hemorrhage, hydrocephalus, extra-axial collection or mass lesion/mass effect. Areas of encephalomalacia in bilateral frontal lobes, left parietal lobe and left occipital lobe from prior ischemic infarctions. Marked brain parenchymal volume loss and deep white matter microangiopathy. Vascular: Calcific atherosclerotic disease at the skull base. Skull: Normal. Negative for fracture or focal lesion.  Sinuses/Orbits: No acute finding. Other: None. IMPRESSION: Areas of encephalomalacia in bilateral frontal lobes, left parietal lobe and left occipital lobe, from prior ischemic infarctions. No definite acute infarction is seen. No evidence of intracranial hemorrhage. Marked brain parenchymal volume loss and deep white matter microangiopathy. Electronically Signed   By: Fidela Salisbury M.D.   On: 02/06/2018 17:41   Vas [place] Lower Extremity Venous (dvt) (only [facility])  Result Date: 02/08/2018  Lower Venous Study Indications: Pain, and Swelling.  Risk Factors: Surgery Rt total hip Atrial fibrillation on Eliquis. Performing Technologist: Toma Copier RVS  Examination Guidelines: A complete evaluation includes B-mode imaging, spectral Doppler, color Doppler, and power Doppler as needed of all accessible portions of each vessel. Bilateral testing is considered an integral part of a complete examination. Limited examinations for reoccurring indications may be performed as noted.  Right Venous Findings: +---------+---------------+---------+-----------+----------+-------------------+          CompressibilityPhasicitySpontaneityPropertiesSummary             +---------+---------------+---------+-----------+----------+-------------------+ CFV      Full           Yes      Yes                                      +---------+---------------+---------+-----------+----------+-------------------+  SFJ      Full                                                             +---------+---------------+---------+-----------+----------+-------------------+ FV Prox  Full           Yes      Yes                                      +---------+---------------+---------+-----------+----------+-------------------+ FV Mid   Full                                                             +---------+---------------+---------+-----------+----------+-------------------+ FV DistalFull           Yes       Yes                                      +---------+---------------+---------+-----------+----------+-------------------+ PFV      Full           Yes      Yes                                      +---------+---------------+---------+-----------+----------+-------------------+ POP      Full           Yes      Yes                                      +---------+---------------+---------+-----------+----------+-------------------+ PTV      Full                                                             +---------+---------------+---------+-----------+----------+-------------------+ PERO     Full                                         Difficult to image                                                        patient unable to  rotate the leg for                                                        optimal imaging     +---------+---------------+---------+-----------+----------+-------------------+  Left Venous Findings: +---+---------------+---------+-----------+----------+-------+    CompressibilityPhasicitySpontaneityPropertiesSummary +---+---------------+---------+-----------+----------+-------+ CFVFull           Yes      Yes                          +---+---------------+---------+-----------+----------+-------+ SFJFull                                                 +---+---------------+---------+-----------+----------+-------+    Summary: Right: There is no evidence of deep vein thrombosis in the lower extremity. No cystic structure found in the popliteal fossa. Left: There is no evidence of deep vein thrombosis in the lower extremity. No cystic structure found in the popliteal fossa.  *See table(s) above for measurements and observations. Electronically signed by Curt Jews MD on 02/08/2018 at 8:51:44 AM.    Final     Assessment and Plan:   1. Syncope -About 20 to 30 minutes  episode of unresponsiveness.  Etiology uncertain.  CT of head negative.  UA clear.  Echocardiogram with normal LV function.  Cannot rule out arrhythmia.  He did have episode of nonsustained VT for 19 beats yesterday.  Denies chest pain, shortness of breath or palpitation.  His Linq has reached ERI in 2017 and elected to leave in place. Outpatient monitor vs battery change. Will review with MD.   2. PAF -Maintaining sinus rhythm throughout admission.  Chadsvas score of 5. Continue Eliquis 5mg  BID.  He was treated with IV heparin last admission for surgery.  Plan to reduce Eliquis to 2.5 mg on his 82th  Birthday letter this month due to creatinine greater than 1.5.  3.  Chronic anemia -Per primary team  4. CKD stage III s/p  right nephrectomy - Scr at baseline of 1.7-1.9  For questions or updates, please contact [place] Please consult [email or web address] for contact info under     Signed, Leanor Kail, PA  02/10/2018 8:00 AM   As above, patient seen and examined.  Briefly he is a 82 year old male with past medical history of cryptogenic stroke, paroxysmal atrial fibrillation, prior right nephrectomy for renal cell carcinoma, chronic stage III kidney disease, hypertension, hyperlipidemia admitted with question syncopal episode for further evaluation.  Patient had recent repair of right hip fracture following a fall.  There was no syncope.  He was at home on November 1 and was sitting on the side of his bed.  He had decreased level of consciousness though apparently did not have frank syncope.  There was no preceding chest pain, dyspnea, nausea, diaphoresis.  He was somewhat unresponsive for approximately 10 to 15 minutes but his wife does state he opened his eyes.  He was admitted for further evaluation.  Telemetry showed 19 beats of wide-complex tachycardia and cardiology asked to evaluate.  Echocardiogram shows normal LV function and mild diastolic dysfunction.  Electrocardiogram showed  sinus rhythm with no ST changes.  1 question syncope-patient had decreased level of  consciousness but apparently did not have frank syncope based on his wife's report.  Etiology of event unclear.  He has had a Linq monitor in the past but is ERI.  His LV function is normal.  He had 19 beats of wide-complex tachycardia on telemetry but there were no associated symptoms.  Patient can be discharged from a cardiac standpoint.  If he has more frequent episodes in the future we can consider revising his Linq monitor.  2 history of paroxysmal atrial fibrillation-continue apixaban.  Will need to decrease dose to 2.5 mg twice daily on November 28 as he will turn 82 years old.  3 chronic stage III kidney disease-follow-up primary care.  4 status post right total hip arthroplasty-follow-up orthopedics.  [facility] will sign off.   Medication Recommendations:  Continue preadmission meds at DC Other recommendations (labs, testing, etc):  No additional cardiac testing Follow up as an outpatient:  2-4 weeks with APP; FU Dr [name] 3 months Kirk Ruths MD

## 2018-02-10 NOTE — Consult Note (Signed)
   [facility] CM Inpatient Consult   02/10/2018  VITALY WANAT February 11, 1936 [id number]     Mr. Julin recently signed up with [place] Management program last hospitalization. However, he was readmitted to the hospital prior to [facility] contact.  Spoke with both Mr. Sangiovanni and wife who remain agreeable to [place] Management follow up. Mrs. Overholser reports the discharge plan is for SNF this time. Discussed that writer will continue to follow along and make appropriate [facility] Care Management referrals once disposition is known.   Mrs. Drost expressed appreciation of visit.  Made inpatient RNCM aware that [facility] is active.  Marthenia Rolling, MSN-Ed, RN,BSN [facility] Liaison [phone]

## 2018-02-11 ENCOUNTER — Other Ambulatory Visit: Payer: Self-pay | Admitting: *Deleted

## 2018-02-11 ENCOUNTER — Telehealth: Payer: Self-pay | Admitting: Internal Medicine

## 2018-02-11 DIAGNOSIS — R2681 Unsteadiness on feet: Secondary | ICD-10-CM | POA: Diagnosis not present

## 2018-02-11 DIAGNOSIS — M6281 Muscle weakness (generalized): Secondary | ICD-10-CM | POA: Diagnosis not present

## 2018-02-11 DIAGNOSIS — S72001D Fracture of unspecified part of neck of right femur, subsequent encounter for closed fracture with routine healing: Secondary | ICD-10-CM | POA: Diagnosis not present

## 2018-02-11 DIAGNOSIS — I951 Orthostatic hypotension: Secondary | ICD-10-CM | POA: Diagnosis not present

## 2018-02-11 DIAGNOSIS — I69398 Other sequelae of cerebral infarction: Secondary | ICD-10-CM | POA: Diagnosis not present

## 2018-02-11 DIAGNOSIS — Z9181 History of falling: Secondary | ICD-10-CM | POA: Diagnosis not present

## 2018-02-11 DIAGNOSIS — R269 Unspecified abnormalities of gait and mobility: Secondary | ICD-10-CM | POA: Diagnosis not present

## 2018-02-11 DIAGNOSIS — Z96641 Presence of right artificial hip joint: Secondary | ICD-10-CM | POA: Diagnosis not present

## 2018-02-11 DIAGNOSIS — I480 Paroxysmal atrial fibrillation: Secondary | ICD-10-CM | POA: Diagnosis not present

## 2018-02-11 NOTE — Progress Notes (Signed)
Report called to [place]. Questions concerns denied.

## 2018-02-11 NOTE — Telephone Encounter (Signed)
Faxed

## 2018-02-11 NOTE — Patient Outreach (Signed)
[facility]) Care Management  02/11/2018  Jeffrey Hardy 1935-04-20 [id number]    Pt readmitted to observational and discharged to a SNF. Pt will be followed by [facility] social worker for possible discharge needs via [facility] community. Will follow up with this referral at that time. This discipline for case closures until reassigned.  Raina Mina, RN Care Management Coordinator [place] Office [phone]

## 2018-02-11 NOTE — Progress Notes (Signed)
No significant events overnight, his vital signs have remained stable, on his physical examination his lungs are clear to auscultation, heart S1-S2 present rhythmic, abdomen soft, no lower extremity edema.  Patient stable to discharge today.

## 2018-02-11 NOTE — Clinical Social Work Note (Signed)
Pt admitting to [facility]- report # [phone] ex# [id number] Wife transporting pt- pt's room available at SNF 1:30pm  Clinical Social Work Assessment  Patient Details  Name: Jeffrey Hardy MRN: [id number] Date of Birth: 03-Jun-1935  Date of referral:  02/10/18               Reason for consult:  Facility Placement                Permission sought to share information with:  Family Supports Permission granted to share information::  Yes, Verbal Permission Granted  Name::     wife Human resources officer::     Relationship::     Contact Information:     Housing/Transportation Living arrangements for the past 2 months:  Single Family Home Source of Information:  Patient, Medical Team, Spouse Patient Interpreter Needed:  None Criminal Activity/Legal Involvement Pertinent to Current Situation/Hospitalization:  No - Comment as needed Significant Relationships:  Friend, Spouse Lives with:  Spouse Do you feel safe going back to the place where you live?  Yes Need for family participation in patient care:  Yes (Comment)(wife)  Care giving concerns:  Pt admitted from home where he resides with his wife. Had fall at home and has had hip replacement (had other hip replaced earlier this year and did home therapy afterward). Was independent ambulating prior to this event   Facilities manager / plan:  CSW consulted to assist with disposition- SNF recommended, pt/wife at first reluctant however they discussed and agreed that "we need to do something different than before, home health is not quite enough this time." CSW made referrals and pt selected [facility]- [employer] authorized admission.  [name] transporting pt there today.   Employment status:  Retired Forensic scientist:  Managed Medicare([employer]) PT Recommendations:  [place] / Referral to community resources:  [place]  Patient/Family's Response to care:  Very  engaged and appreciative  Patient/Family's Understanding of and Emotional Response to Diagnosis, Current Treatment, and Prognosis:  Pt very excited about admitting to [facility]- sister resided there in the past and they feel very comfortable with staff and care there. Pt became tearful expressing gratitude to hospital staff for his care.   Emotional Assessment Appearance:  Appears stated age Attitude/Demeanor/Rapport:  Engaged Affect (typically observed):  Happy, Hopeful Orientation:  Oriented to Self, Oriented to Place, Oriented to  Time, Oriented to Situation Alcohol / Substance use:  Not Applicable Psych involvement (Current and /or in the community):  No (Comment)  Discharge Needs  Concerns to be addressed:  Discharge Planning Concerns Readmission within the last 30 days:  Yes Current discharge risk:  Dependent with Mobility(frequent falls) Barriers to Discharge:  [place], LCSW 02/11/2018, 11:29 AM [phone]

## 2018-02-11 NOTE — Telephone Encounter (Signed)
Copied from [place] [id number]. Topic: General - Other >> Feb 11, 2018  2:07 PM Keene Breath wrote: Reason for CRM: Heather with [facility], called to request the patient's most recent Vaccine Record.  The fax # is [phone], CB# [phone].  Please advise

## 2018-02-11 NOTE — Consult Note (Signed)
   [facility] CM Inpatient Consult   02/11/2018  Tyshon Fanning Pehrson 1936-02-18 [id number]    [facility] Care Management follow up.  Chart reviewed. Mr. Vallejo to discharge to [facility] today. Will make referral to [facility] LCSW for follow up while at SNF. Patient was active with [facility] prior to admission.   Marthenia Rolling, MSN-Ed, RN,BSN [facility] Liaison [phone]

## 2018-02-12 ENCOUNTER — Ambulatory Visit: Payer: Self-pay | Admitting: *Deleted

## 2018-02-16 ENCOUNTER — Other Ambulatory Visit: Payer: Self-pay | Admitting: *Deleted

## 2018-02-16 ENCOUNTER — Encounter: Payer: Self-pay | Admitting: *Deleted

## 2018-02-16 NOTE — Patient Outreach (Signed)
[facility] ([facility]) Care Management  02/16/2018  Jeffrey Hardy 06/02/1935 [id number]   CSW was able to make initial contact with patient and patient's wife, Jeffrey Hardy today to perform the initial assessment on patient, as well as assess and assist with social work needs and services.  CSW met with patient and [name] at [facility] where patient currently resides to receive short-term rehabilitative services.  CSW introduced self, explained role and types of services provided through [facility] ([facility]).  CSW further explained to patient and [name] that CSW works with Atika Hall, Hospital Liaison, also with [facility]. CSW then explained the reason for the visit, indicating that Ms. Hall thought that patient would benefit from social work services and resources to assist with discharge planning needs from the skilled nursing facility.  CSW obtained two HIPAA compliant identifiers from patient, which included patient's name and date of birth. Patient and [name] reported that patient will be discharged home tomorrow (Tuesday, February 17, 2018), after receiving one last round of therapies (both physical and occupational).  Patient will also be discharged home with a home health nurse, physical therapist, occupational therapist and an aide, all through [facility].  [name] indicated that she has hired two CNA's (Certified Nursing Assistants) to come out to her home to care for patient when she is not available.  [name] was in the process of leaving [facility] when CSW arrived, reporting that she needed to go pick up bed rails for patient to use in the home.  [name] denies the need for any additional durable medical equipment at this time, indicating that patient already has a walker, wheelchair, bedside commode/3-in-1 and a shower chair with back.  [name] stated that she is able to transport  patient to and from all his physician appointments, as well as administer his prescription medications. CSW will perform a case closure on patient, as all goals of treatment have been met from social work standpoint and no additional social work needs have been identified at this time.  CSW will place an order for patient to be contacted by an RNCM, also with [facility], to perform transition of care calls, as well as follow for disease management services.  CSW will fax an update to patient's Primary Care Physician, Dr. [name] to ensure that they are aware of CSW's involvement with patient's plan of care.   Joanna Saporito, BSW, MSW, LCSW  Licensed Clinical Social Worker  [facility]  Mailing Address-[address], [place], [place] [address] Physical Address-[address], , [place] [address] Toll Free Main # [phone] Fax # [phone] Cell # [phone]  Office # [phone] [email or web address]        

## 2018-02-17 ENCOUNTER — Other Ambulatory Visit: Payer: Self-pay | Admitting: *Deleted

## 2018-02-17 ENCOUNTER — Encounter: Payer: Self-pay | Admitting: Internal Medicine

## 2018-02-17 ENCOUNTER — Ambulatory Visit (INDEPENDENT_AMBULATORY_CARE_PROVIDER_SITE_OTHER): Payer: PPO | Admitting: Internal Medicine

## 2018-02-17 VITALS — BP 130/70 | HR 84 | Ht 73.0 in | Wt 200.0 lb

## 2018-02-17 DIAGNOSIS — I480 Paroxysmal atrial fibrillation: Secondary | ICD-10-CM

## 2018-02-17 NOTE — Patient Instructions (Signed)
Medication Instructions:  Your physician recommends that you continue on your current medications as directed. Please refer to the Current Medication list given to you today.  Labwork: None ordered.  Testing/Procedures: None ordered.  Follow-Up: Your physician recommends that you schedule a follow-up appointment in:   3 months with Chanetta Marshall  12 months with Dr [name]  Any Other Special Instructions Will Be Listed Below (If Applicable).     If you need a refill on your cardiac medications before your next appointment, please call your pharmacy.

## 2018-02-17 NOTE — Progress Notes (Signed)
Patient Care Team: Biagio Borg, MD as PCP - Berkley Harvey, Suann Larry, RN as [facility] [facility] Management   HPI  Jeffrey Hardy is a 82 y.o. male Seen in followup for a cryptogenic stroke for which he underwent loop recorder insertion 3/14   He has recovered fully.  His monitor demonstrated atrial fibrillation. He was seen by DC-NP and started on ELIQUIS. Aggrenox was discontinued. He was seen in follow-up tolerating his anticoagulation   He was hospitalized 11/19 following an episode of decreased responsiveness occurring in the week of antecedent closed hip fracture.  He underwent arthroplasty.  Following discharge home he was sitting on the side of the bed and became unresponsive or minimally so.  He did not lose postural tone.  His wife took his blood pressure and noted it was 70.  Does not recall heart rate.  Was quite pale.  She laid him flat.  By the time EMS arrived things were much better.  Telemetry demonstrated nonsustained ventricular tachycardia.  Was seen in consultation by Dr. London Sheer.  Note reviewed.  Interval echocardiogram that demonstrated normal LV function.  In hospital orthostatic vital signs were abnormal with a blood pressure fall 130--110 with a heart rate change max of 83--111  Since having gone home is doing somewhat better.  Reviewing his falls, newborn appeared to have been syncopal.  He recalls on both occasions that his feet got tangled.  He denies orthostatic intolerance, standing intolerance or shower intolerance.   Date Cr K Hgb  7/19   13.3  11/19 1.74 4.3 8.6          Past Medical History:  Diagnosis Date  . Anxiety   . Bilateral renal cysts   . Cancer ([place])    renal mass  . Depression   . Diverticulosis of colon   . Erectile dysfunction   . Hematuria   . History of cerebral embolic infarction x2 CVA's ---  neurologist-  [name]--- per pt residual abnormal gait , does need cane   12-16-2011  right frontal embolic infarct and  12-29-3005 left frontal embolic infarct (per first MRI previous hemorrgaic cerebral ischemia)  . History of duodenal ulcer    1997  . History of Helicobacter pylori infection    1997  . History of hiatal hernia   . History of loop recorder    Current  . History of syncope    1997  and 2014  s/p  loop recorder  . Hyperlipidemia   . Hypertension   . Nocturia   . PAF (paroxysmal atrial fibrillation) [facility])    cardiologist-  dr [name]  . Renal cell cancer ([place]) 09/06/2016  . Right renal mass   . Status post placement of implantable loop recorder    07-06-2012  . Stroke [facility])    2013 2 mild strokes mild weakness bil sides  . Urgency of urination   . Wears hearing aid    right ear only    Past Surgical History:  Procedure Laterality Date  . APPENDECTOMY  age 78  . COLONOSCOPY  last one 07-17-2015  . CYSTOSCOPY/RETROGRADE/URETEROSCOPY Right 04/22/2016   Procedure: CYSTOSCOPY/RETROGRADE/URETEROSCOPY/ BIOPSY/ STENT PLACEMENT;  Surgeon: [name], MD;  Location: [facility];  Service: Urology;  Laterality: Right;  . ESOPHAGOGASTRODUODENOSCOPY  last one 09-16-2014  . LOOP RECORDER IMPLANT N/A 07/06/2012   Procedure: LOOP RECORDER IMPLANT;  Surgeon: [name], MD;  [facility] C[facility] LAB;  Service: Cardiovascular;  Laterality: N/A;  . ROBOT ASSITED LAPAROSCOPIC NEPHROURETERECTOMY Right 05/13/2016   Procedure: XI ROBOT ASSITED LAPAROSCOPIC NEPHROURETERECTOMY;  Surgeon: [name], MD;  Location: [facility];  Service: Urology;  Laterality: Right;  . TEE WITHOUT CARDIOVERSION  12/19/2011   Procedure: TRANSESOPHAGEAL ECHOCARDIOGRAM (TEE);  Surgeon: [name], MD;  Location: [facility] ENDOSCOPY;  Service: Cardiovascular;  Laterality: N/A;  no evidence thrombus in the atrial cavity or appendage;  mild AV thickened w/ mild AI;  minimal fixed plaque in thoracic aorta;  mild MV thickened w/ mild MR;  NO pfo by color doppler or with injection of agitated saline  . TOTAL HIP  ARTHROPLASTY Left 09/07/2017   Procedure: TOTAL HIP ARTHROPLASTY ANTERIOR APPROACH;  Surgeon: Rod Can, MD;  Location: [facility];  Service: Orthopedics;  Laterality: Left;  . TOTAL HIP ARTHROPLASTY Right 02/01/2018   Procedure: TOTAL HIP ARTHROPLASTY ANTERIOR APPROACH;  Surgeon: Rod Can, MD;  Location: [facility];  Service: Orthopedics;  Laterality: Right;  . TRANSTHORACIC ECHOCARDIOGRAM  09-10-20103   grade 1 diastolic dysfunction,  ef 55-60%/  AV sclerosis without stenosis/  trivial TR  . WRIST FRACTURE SURGERY Right 2014  approx.   retained hardware    Current Outpatient Medications  Medication Sig Dispense Refill  . apixaban (ELIQUIS) 5 MG TABS tablet Take 5 mg by mouth 2 (two) times daily.    . baclofen (LIORESAL) 10 MG tablet Take 10 mg by mouth daily as needed (hiccups).    . Cholecalciferol (VITAMIN D3) 5000 units CAPS Take 1 capsule by mouth daily.    . feeding supplement, ENSURE ENLIVE, (ENSURE ENLIVE) LIQD Take 237 mLs by mouth 2 (two) times daily between meals.    . ferrous sulfate 325 (65 FE) MG tablet Take 325 mg by mouth daily with breakfast.    . HYDROcodone-acetaminophen (NORCO/VICODIN) 5-325 MG tablet Take 1-2 tablets by mouth every 4 (four) hours as needed for moderate pain (pain score 4-6). 10 tablet 0  . polyethylene glycol (MIRALAX / GLYCOLAX) packet Take 17 g by mouth daily. 14 each 0  . senna-docusate (SENOKOT-S) 8.6-50 MG tablet Take 1 tablet by mouth at bedtime as needed for mild constipation or moderate constipation. 30 tablet 0  . tamsulosin (FLOMAX) 0.4 MG CAPS capsule Take 1 capsule (0.4 mg total) by mouth daily after breakfast. 90 capsule 3   No current facility-administered medications for this visit.     Allergies  Allergen Reactions  . Acyclovir And Related Other (See Comments)    Unsure of what happens    Review of Systems negative except from HPI and PMH  Physical Exam BP 130/70   Pulse 84   Ht 6\' 1"  (1.854 m)   Wt 200 lb (90.7 kg)   SpO2  91%   BMI 26.39 kg/m  Well developed and nourished in no acute distress HENT normal Neck supple with JVP-flat Clear Regular rate and rhythm, no murmurs or gallops Abd-soft with active BS No Clubbing cyanosis edema Skin-warm and dry A & Oriented  Grossly normal sensory and motor function   02/06/18 Personally reviewed  ECG sinus 76 16/09/40   Assessment and  Plan  Cryptogenic stroke  Atrial fibrillation   Ventricular tachycardia-nonsustained  Implantable loop recorder-LINQ at eri  Orthostatic hypotension/syncope   Anemia  The patient had presyncope/syncope following his hip surgery.  Great work done by his wife confirmed hypotension.  No further evaluation I think is necessary.  He denies symptoms of orthostatic intolerance apart from this episode.  I have stressed  the importance of being attuned and letting us know.  He strongly affirms that his falls that resulted in his hip injuries were not related to loss of consciousness.  Continue on his apixaban.  He is aware that he should reduce his dose on his birthday at the end of this month.  Presuming that his anemia was related to his surgery.  Follow-up of hemoglobin will be deferred to his PCP

## 2018-02-17 NOTE — Patient Outreach (Signed)
[facility]) Care Management  02/17/2018  SHAWNMICHAEL PARENTEAU 26-Dec-1935 [id number]    Transition of care  RN reach out to pt today to address recent post-op discharged via SNF and emmi red notification. RN spoke with pt and introduced [facility] program and available services. Pt verified identifiers and requested RN to speak with his spouse [name]). RN spoke with spouse and attempted to completed the transition of care however wife indicated pt is doing well with several provider visits. States pt went to CAD today and will see the surgeon on Friday related to his hip surgery. States they have 2 CNA assisting when needed and wife verifies she drives pt to all his medical appointments with no problems. RN verified pt uses his DME as wife indicated pt uses his walker, cane and wheelchair at this time with no reported problems.  RN discussed possible plan of care related to review of his medications however wife indicated this has been reviewed prior to this call and reluctantly to review once again but confirms pt has all his prescribed medications with no problems. RN offered to continue weekly transition of care contacts however wife feels this is not necessary and requested contact name and number of this RN case manager and stated she would call if needed in the future however appreciative but no needs presented at this time. Will notify primary provider and close case accordingly.  Raina Mina, RN Care Management Coordinator [place] Office [phone]

## 2018-02-20 DIAGNOSIS — F329 Major depressive disorder, single episode, unspecified: Secondary | ICD-10-CM | POA: Diagnosis not present

## 2018-02-20 DIAGNOSIS — I129 Hypertensive chronic kidney disease with stage 1 through stage 4 chronic kidney disease, or unspecified chronic kidney disease: Secondary | ICD-10-CM | POA: Diagnosis not present

## 2018-02-20 DIAGNOSIS — N400 Enlarged prostate without lower urinary tract symptoms: Secondary | ICD-10-CM | POA: Diagnosis not present

## 2018-02-20 DIAGNOSIS — Z95818 Presence of other cardiac implants and grafts: Secondary | ICD-10-CM | POA: Diagnosis not present

## 2018-02-20 DIAGNOSIS — D631 Anemia in chronic kidney disease: Secondary | ICD-10-CM | POA: Diagnosis not present

## 2018-02-20 DIAGNOSIS — I480 Paroxysmal atrial fibrillation: Secondary | ICD-10-CM | POA: Diagnosis not present

## 2018-02-20 DIAGNOSIS — F419 Anxiety disorder, unspecified: Secondary | ICD-10-CM | POA: Diagnosis not present

## 2018-02-20 DIAGNOSIS — I951 Orthostatic hypotension: Secondary | ICD-10-CM | POA: Diagnosis not present

## 2018-02-20 DIAGNOSIS — I69398 Other sequelae of cerebral infarction: Secondary | ICD-10-CM | POA: Diagnosis not present

## 2018-02-20 DIAGNOSIS — Z96641 Presence of right artificial hip joint: Secondary | ICD-10-CM | POA: Diagnosis not present

## 2018-02-20 DIAGNOSIS — S72021D Displaced fracture of epiphysis (separation) (upper) of right femur, subsequent encounter for closed fracture with routine healing: Secondary | ICD-10-CM | POA: Diagnosis not present

## 2018-02-20 DIAGNOSIS — K5730 Diverticulosis of large intestine without perforation or abscess without bleeding: Secondary | ICD-10-CM | POA: Diagnosis not present

## 2018-02-20 DIAGNOSIS — G3184 Mild cognitive impairment, so stated: Secondary | ICD-10-CM | POA: Diagnosis not present

## 2018-02-20 DIAGNOSIS — N183 Chronic kidney disease, stage 3 (moderate): Secondary | ICD-10-CM | POA: Diagnosis not present

## 2018-02-20 DIAGNOSIS — Z96643 Presence of artificial hip joint, bilateral: Secondary | ICD-10-CM | POA: Diagnosis not present

## 2018-02-20 DIAGNOSIS — F0390 Unspecified dementia without behavioral disturbance: Secondary | ICD-10-CM | POA: Diagnosis not present

## 2018-02-20 DIAGNOSIS — S72001D Fracture of unspecified part of neck of right femur, subsequent encounter for closed fracture with routine healing: Secondary | ICD-10-CM | POA: Diagnosis not present

## 2018-02-20 DIAGNOSIS — Z471 Aftercare following joint replacement surgery: Secondary | ICD-10-CM | POA: Diagnosis not present

## 2018-02-20 DIAGNOSIS — M1712 Unilateral primary osteoarthritis, left knee: Secondary | ICD-10-CM | POA: Diagnosis not present

## 2018-02-20 DIAGNOSIS — Z87891 Personal history of nicotine dependence: Secondary | ICD-10-CM | POA: Diagnosis not present

## 2018-02-20 DIAGNOSIS — Z7901 Long term (current) use of anticoagulants: Secondary | ICD-10-CM | POA: Diagnosis not present

## 2018-02-20 DIAGNOSIS — Z9181 History of falling: Secondary | ICD-10-CM | POA: Diagnosis not present

## 2018-02-21 DIAGNOSIS — M48061 Spinal stenosis, lumbar region without neurogenic claudication: Secondary | ICD-10-CM | POA: Diagnosis not present

## 2018-03-02 DIAGNOSIS — Z0000 Encounter for general adult medical examination without abnormal findings: Secondary | ICD-10-CM | POA: Diagnosis not present

## 2018-03-02 DIAGNOSIS — I129 Hypertensive chronic kidney disease with stage 1 through stage 4 chronic kidney disease, or unspecified chronic kidney disease: Secondary | ICD-10-CM | POA: Diagnosis not present

## 2018-03-02 DIAGNOSIS — I480 Paroxysmal atrial fibrillation: Secondary | ICD-10-CM | POA: Diagnosis not present

## 2018-03-02 DIAGNOSIS — E785 Hyperlipidemia, unspecified: Secondary | ICD-10-CM | POA: Diagnosis not present

## 2018-03-02 DIAGNOSIS — D696 Thrombocytopenia, unspecified: Secondary | ICD-10-CM | POA: Diagnosis not present

## 2018-03-02 DIAGNOSIS — N2889 Other specified disorders of kidney and ureter: Secondary | ICD-10-CM | POA: Diagnosis not present

## 2018-03-02 DIAGNOSIS — I639 Cerebral infarction, unspecified: Secondary | ICD-10-CM | POA: Diagnosis not present

## 2018-03-02 DIAGNOSIS — D631 Anemia in chronic kidney disease: Secondary | ICD-10-CM | POA: Diagnosis not present

## 2018-03-02 DIAGNOSIS — N2581 Secondary hyperparathyroidism of renal origin: Secondary | ICD-10-CM | POA: Diagnosis not present

## 2018-03-02 DIAGNOSIS — Q600 Renal agenesis, unilateral: Secondary | ICD-10-CM | POA: Diagnosis not present

## 2018-03-02 DIAGNOSIS — C649 Malignant neoplasm of unspecified kidney, except renal pelvis: Secondary | ICD-10-CM | POA: Diagnosis not present

## 2018-03-02 DIAGNOSIS — N183 Chronic kidney disease, stage 3 (moderate): Secondary | ICD-10-CM | POA: Diagnosis not present

## 2018-03-02 DIAGNOSIS — N189 Chronic kidney disease, unspecified: Secondary | ICD-10-CM | POA: Diagnosis not present

## 2018-03-12 DIAGNOSIS — C641 Malignant neoplasm of right kidney, except renal pelvis: Secondary | ICD-10-CM | POA: Diagnosis not present

## 2018-03-12 DIAGNOSIS — N3941 Urge incontinence: Secondary | ICD-10-CM | POA: Diagnosis not present

## 2018-03-12 DIAGNOSIS — N401 Enlarged prostate with lower urinary tract symptoms: Secondary | ICD-10-CM | POA: Diagnosis not present

## 2018-03-20 DIAGNOSIS — S72032D Displaced midcervical fracture of left femur, subsequent encounter for closed fracture with routine healing: Secondary | ICD-10-CM | POA: Diagnosis not present

## 2018-05-04 ENCOUNTER — Ambulatory Visit (INDEPENDENT_AMBULATORY_CARE_PROVIDER_SITE_OTHER): Payer: PPO | Admitting: Neurology

## 2018-05-04 ENCOUNTER — Encounter: Payer: Self-pay | Admitting: Neurology

## 2018-05-04 DIAGNOSIS — G603 Idiopathic progressive neuropathy: Secondary | ICD-10-CM | POA: Diagnosis not present

## 2018-05-04 DIAGNOSIS — G629 Polyneuropathy, unspecified: Secondary | ICD-10-CM

## 2018-05-04 HISTORY — DX: Polyneuropathy, unspecified: G62.9

## 2018-05-04 NOTE — Progress Notes (Signed)
Please refer to EMG and nerve conduction procedure note.  

## 2018-05-04 NOTE — Procedures (Signed)
     HISTORY:  Jeffrey Hardy is an 83 year old gentleman with a history of onset spontaneously of a right foot drop in September 2019.  The patient reports no numbness of the feet, he denies any back pain or pain down the legs.  The patient believes that the right foot drop is improving gradually over time.  He does report that his feet feel cold a lot.  He is being evaluated for a possible neuropathy or a lumbar radiculopathy.  NERVE CONDUCTION STUDIES:  Nerve conduction studies were performed on both lower extremities.  The distal motor latencies for the peroneal nerves were prolonged on the right and normal on the left with a low motor amplitude on the right, normal on the left.  The distal motor latencies for the posterior tibial nerves were prolonged on the right, normal on the left with a low motor amplitude on the right and normal on the left.  Slowing was seen for the peroneal nerves bilaterally, much more significant on the right peroneal nerve.  There was slowing for the right posterior tibial nerve, borderline normal on the left.  The sural sensory latencies were prolonged on the right, borderline normal on the left, and unobtainable for the peroneal nerves bilaterally.  The F-wave latencies for the posterior tibial nerves were prolonged bilaterally.  EMG STUDIES:  EMG study was performed on the right lower extremity:  The tibialis anterior muscle reveals 2 to 4K motor units with decreased recruitment. 1+ fibrillations and positive waves were seen. The peroneus tertius muscle reveals 2 to 3K motor units with decreased recruitment. 2+ fibrillations and positive waves were seen. The medial gastrocnemius muscle reveals 1 to 3K motor units with decreased recruitment. 1+ fibrillations and positive waves were seen. The vastus lateralis muscle reveals 2 to 4K motor units with full recruitment. No fibrillations or positive waves were seen. The iliopsoas muscle reveals 2 to 4K motor units with full  recruitment. No fibrillations or positive waves were seen. The biceps femoris muscle (long head) reveals 2 to 4K motor units with full recruitment. No fibrillations or positive waves were seen. The lumbosacral paraspinal muscles were tested at 3 levels, and revealed no abnormalities of insertional activity at all 3 levels tested. There was good relaxation.   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  The EMG study of the right lower extremity shows distal chronic and acute signs of neuropathic denervation consistent with a diagnosis of peripheral neuropathy.  There is no clear evidence of an overlying lumbosacral radiculopathy on this evaluation.  Jill Alexanders MD 05/04/2018 1:47 PM  [facility] [address] [place], [place] [address]  Phone [phone] Fax [phone]

## 2018-05-05 NOTE — Progress Notes (Signed)
[place]    Nerve / Sites Muscle Latency Ref. Amplitude Ref. Rel Amp Segments Distance Velocity Ref. Area    ms ms mV mV %  cm m/s m/s mVms  R Peroneal - EDB     Ankle EDB 8.5 ?6.5 0.7 ?2.0 100 Ankle - EDB 9   3.1     Fib head EDB 18.1  0.7  87.7 Fib head - Ankle 33 34 ?44 3.4     Pop fossa EDB 22.2  0.3  49.7 Pop fossa - Fib head 10 25 ?44 1.8         Pop fossa - Ankle      L Peroneal - EDB     Ankle EDB 5.3 ?6.5 3.9 ?2.0 100 Ankle - EDB 9   9.5     Fib head EDB 13.3  2.5  64.3 Fib head - Ankle 33 41 ?44 9.5     Pop fossa EDB 15.8  2.3  92.1 Pop fossa - Fib head 10 40 ?44 8.3         Pop fossa - Ankle      R Tibial - AH     Ankle AH 6.4 ?5.8 3.4 ?4.0 100 Ankle - AH 9   11.5     Pop fossa AH 18.9  2.1  60.9 Pop fossa - Ankle 44 35 ?41 9.9  L Tibial - AH     Ankle AH 5.8 ?5.8 5.0 ?4.0 100 Ankle - AH 9   9.9     Pop fossa AH 16.5  3.2  64 Pop fossa - Ankle 44 41 ?41 8.3             SNC    Nerve / Sites Rec. Site Peak Lat Ref.  Amp Ref. Segments Distance    ms ms V V  cm  R Sural - Ankle (Calf)     Calf Ankle 5.1 ?4.4 2 ?6 Calf - Ankle 14  L Sural - Ankle (Calf)     Calf Ankle 4.4 ?4.4 4 ?6 Calf - Ankle 14  R Superficial peroneal - Ankle     Lat leg Ankle NR ?4.4 NR ?6 Lat leg - Ankle 14  L Superficial peroneal - Ankle     Lat leg Ankle NR ?4.4 NR ?6 Lat leg - Ankle 14              F  Wave    Nerve F Lat Ref.   ms ms  R Tibial - AH 72.1 ?56.0  L Tibial - AH 65.5 ?56.0         EMG full       EMG Summary Table    Spontaneous MUAP Recruitment  Muscle IA Fib PSW Fasc Other Amp Dur. Poly Pattern  R. Abductor digiti minimi (manus) Normal None None None _______ Normal Normal Normal Normal

## 2018-05-07 NOTE — Progress Notes (Signed)
Electrophysiology Office Note Date: 05/08/2018  ID:  Jeffrey Hardy 04-27-1935, MRN [id number]  PCP: Jeffrey Borg, MD Electrophysiologist: Jeffrey Hardy  CC: AF follow up  Jeffrey Hardy is a 83 y.o. male seen today for Dr Jeffrey Hardy.  He presents today for routine electrophysiology followup.  Since last being seen in our clinic, the patient reports doing relatively well.  He has been having trouble with leg weakness and is undergoing nerve conduction studies.  He denies chest pain, palpitations, dyspnea, PND, orthopnea, nausea, vomiting, dizziness, syncope, edema, weight gain, or early satiety.  Past Medical History:  Diagnosis Date  . Anxiety   . Bilateral renal cysts   . Cancer ([place])    renal mass  . Depression   . Diverticulosis of colon   . Erectile dysfunction   . Hematuria   . History of cerebral embolic infarction x2 CVA's ---  neurologist-  dr Jeffrey Hardy--- per pt residual abnormal gait , does need cane   12-16-2011  right frontal embolic infarct and [id number] left frontal embolic infarct (per first MRI previous hemorrgaic cerebral ischemia)  . History of duodenal ulcer    1997  . History of Helicobacter pylori infection    1997  . History of hiatal hernia   . History of loop recorder    Current  . History of syncope    1997  and 2014  s/p  loop recorder  . Hyperlipidemia   . Hypertension   . Nocturia   . PAF (paroxysmal atrial fibrillation) [facility])    cardiologist-  dr Jeffrey Hardy  . Peripheral neuropathy 05/04/2018  . Renal cell cancer ([place]) 09/06/2016  . Right renal mass   . Status post placement of implantable loop recorder    07-06-2012  . Stroke [facility])    2013 2 mild strokes mild weakness bil sides  . Urgency of urination   . Wears hearing aid    right ear only   Past Surgical History:  Procedure Laterality Date  . APPENDECTOMY  age 13  . COLONOSCOPY  last one 07-17-2015  . CYSTOSCOPY/RETROGRADE/URETEROSCOPY Right 04/22/2016   Procedure:  CYSTOSCOPY/RETROGRADE/URETEROSCOPY/ BIOPSY/ STENT PLACEMENT;  Surgeon: Jeffrey Gustin, MD;  Location: [facility];  Service: Urology;  Laterality: Right;  . ESOPHAGOGASTRODUODENOSCOPY  last one 09-16-2014  . LOOP RECORDER IMPLANT N/A 07/06/2012   Procedure: LOOP RECORDER IMPLANT;  Surgeon: [name], MD;  Location: [facility] CATH LAB;  Service: Cardiovascular;  Laterality: N/A;  . ROBOT ASSITED LAPAROSCOPIC NEPHROURETERECTOMY Right 05/13/2016   Procedure: XI ROBOT ASSITED LAPAROSCOPIC NEPHROURETERECTOMY;  Surgeon: Jeffrey Gustin, MD;  Location: [facility];  Service: Urology;  Laterality: Right;  . TEE WITHOUT CARDIOVERSION  12/19/2011   Procedure: TRANSESOPHAGEAL ECHOCARDIOGRAM (TEE);  Surgeon: Jeffrey Records, MD;  Location[facility] ENDOSCOPY;  Service: Cardiovascular;  Laterality: N/A;  no evidence thrombus in the atrial cavity or appendage;  mild AV thickened w/ mild AI;  minimal fixed plaque in thoracic aorta;  mild MV thickened w/ mild MR;  NO pfo by color doppler or with injection of agitated saline  . TOTAL HIP ARTHROPLASTY Left 09/07/2017   Procedure: TOTAL HIP ARTHROPLASTY ANTERIOR APPROACH;  Surgeon: Jeffrey Can, MD;  Location: [facility];  Service: Orthopedics;  Laterality: Left;  . TOTAL HIP ARTHROPLASTY Right 02/01/2018   Procedure: TOTAL HIP ARTHROPLASTY ANTERIOR APPROACH;  Surgeon: Jeffrey Can, MD;  Location: [place];  Service: Orthopedics;  Laterality: Right;  . TRANSTHORACIC ECHOCARDIOGRAM  09-10-20103   grade 1 diastolic  dysfunction,  ef 55-60%/  AV sclerosis without stenosis/  trivial TR  . WRIST FRACTURE SURGERY Right 2014  approx.   retained hardware    Current Outpatient Medications  Medication Sig Dispense Refill  . Cholecalciferol (VITAMIN D3) 5000 units CAPS Take 1 capsule by mouth daily.    [name] ELIQUIS 2.5 MG TABS tablet Take 1 tablet by mouth 2 (two) times daily.    . ferrous sulfate 325 (65 FE) MG tablet Take 325 mg by mouth daily with breakfast.    . tamsulosin  (FLOMAX) 0.4 MG CAPS capsule Take 1 capsule (0.4 mg total) by mouth daily after breakfast. 90 capsule 3   No current facility-administered medications for this visit.     Allergies:   Acyclovir and related   Social History: Social History   Socioeconomic History  . Marital status: Married    Spouse name: Not on file  . Number of children: 2  . Years of education: Not on file  . Highest education level: Not on file  Occupational History  . Occupation: retired  Scientific laboratory technician  . Financial resource strain: Not on file  . Food insecurity:    Worry: Not on file    Inability: Not on file  . Transportation needs:    Medical: Not on file    Non-medical: Not on file  Tobacco Use  . Smoking status: Former Smoker    Years: 5.00    Types: Cigarettes, Pipe    Last attempt to quit: 09/07/1966    Years since quitting: 51.7  . Smokeless tobacco: Never Used  Substance and Sexual Activity  . Alcohol use: No    Alcohol/week: 0.0 standard drinks  . Drug use: No  . Sexual activity: Yes  Lifestyle  . Physical activity:    Days per week: Not on file    Minutes per session: Not on file  . Stress: Not on file  Relationships  . Social connections:    Talks on phone: Not on file    Gets together: Not on file    Attends religious service: Not on file    Active member of club or organization: Not on file    Attends meetings of clubs or organizations: Not on file    Relationship status: Not on file  . Intimate partner violence:    Fear of current or ex partner: Not on file    Emotionally abused: Not on file    Physically abused: Not on file    Forced sexual activity: Not on file  Other Topics Concern  . 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 Patient is retired from [employer]    Family History: Family History  Problem Relation Age of Onset  . Heart Problems Mother   . Brain cancer Father   . Parkinsonism Brother   . Dementia Brother   . Colon cancer  Neg Hx   . Esophageal cancer Neg Hx   . Stomach cancer Neg Hx   . Gastric cancer Neg Hx   . Liver disease Neg Hx   . Kidney disease Neg Hx   . Diabetes Neg Hx     Review of Systems: All other systems reviewed and are otherwise negative except as noted above.   Physical Exam: VS:  BP 124/60   Pulse 87   Ht 6\' 1"  (1.854 m)   Wt 207 lb 6.4 oz (94.1 kg)   SpO2 92%   BMI 27.36 kg/m  ,  BMI Body mass index is 27.36 kg/m. Wt Readings from Last 3 Encounters:  05/08/18 207 lb 6.4 oz (94.1 kg)  02/17/18 200 lb (90.7 kg)  02/06/18 192 lb 12.8 oz (87.5 kg)    GEN- The patient is elderly appearing, alert and oriented x 3 today.   HEENT: normocephalic, atraumatic; sclera clear, conjunctiva pink; hearing intact; oropharynx clear; neck supple  Lungs- Clear to ausculation bilaterally, normal work of breathing.  No wheezes,  rales, rhonchi Heart- Regular rate and rhythm  GI- soft, non-tender, non-distended, bowel sounds present  Extremities- no clubbing, cyanosis, or edema  MS- no significant deformity or atrophy Skin- warm and dry, no rash or lesion  Psych- euthymic mood, full affect Neuro- strength and sensation are intact   EKG:  EKG is not ordered today.  Recent Labs: 02/02/2018: TSH 1.063 02/07/2018: ALT 43 02/08/2018: BUN 33; Creatinine, Ser 1.74; Hemoglobin 8.6; Platelets 165; Potassium 4.3; Sodium 141 02/09/2018: Magnesium 2.3    Other studies Reviewed: Additional studies/ Hardy that were reviewed today include: Dr Olin Pia office notes  Assessment and Plan:  1.  Paroxysmal atrial fibrillation Asymptomatic  Continue Eliquis - decreased dose 2/2 age and creatinine Labs followed by PCP   2.  Orthostatic hypotension Stable No change required today   Current medicines are reviewed at length with the patient today.   The patient does not have concerns regarding his medicines.  The following changes were made today:  none  Labs/ tests ordered today include: none No  orders of the defined types were placed in this encounter.    Disposition:   Follow up with Dr Jeffrey Hardy 1 year     Signed, Chanetta Marshall, NP 05/08/2018 11:28 AM   [address] [address] [phone] (office) [phone] (fax)

## 2018-05-08 ENCOUNTER — Encounter: Payer: Self-pay | Admitting: Nurse Practitioner

## 2018-05-08 ENCOUNTER — Ambulatory Visit (INDEPENDENT_AMBULATORY_CARE_PROVIDER_SITE_OTHER): Payer: PPO | Admitting: Nurse Practitioner

## 2018-05-08 VITALS — BP 124/60 | HR 87 | Ht 73.0 in | Wt 207.4 lb

## 2018-05-08 DIAGNOSIS — I480 Paroxysmal atrial fibrillation: Secondary | ICD-10-CM

## 2018-05-08 NOTE — Patient Instructions (Signed)
Medication Instructions:  none If you need a refill on your cardiac medications before your next appointment, please call your pharmacy.   Lab work: none If you have labs (blood work) drawn today and your tests are completely normal, you will receive your results only by: [name] MyChart Message (if you have MyChart) OR . A paper copy in the mail If you have any lab test that is abnormal or we need to change your treatment, we will call you to review the results.  Testing/Procedures: none  Follow-Up: At [facility], you and your health needs are our priority.  As part of our continuing mission to provide you with exceptional heart care, we have created designated Provider Care Teams.  These Care Teams include your primary Cardiologist (physician) and Advanced Practice Providers (APPs -  Physician Assistants and Nurse Practitioners) who all work together to provide you with the care you need, when you need it. You will need a follow up appointment in 1 years.  Please call our office 2 months in advance to schedule this appointment.  You may see Dr [name] or one of the following Advanced Practice Providers on your designated Care Team:   Chanetta Marshall, NP . Tommye Standard, PA-C  Any Other Special Instructions Will Be Listed Below (If Applicable).

## 2018-05-11 ENCOUNTER — Encounter: Payer: Medicare Other | Admitting: Neurology

## 2018-05-11 DIAGNOSIS — Z96641 Presence of right artificial hip joint: Secondary | ICD-10-CM | POA: Diagnosis not present

## 2018-05-11 DIAGNOSIS — Z471 Aftercare following joint replacement surgery: Secondary | ICD-10-CM | POA: Diagnosis not present

## 2018-05-25 ENCOUNTER — Telehealth: Payer: Self-pay | Admitting: Neurology

## 2018-05-25 NOTE — Telephone Encounter (Signed)
Per Dr. Rolena Infante is requesting patient see Dr. Jaynee Eagles to follow up Nerve Conduction EMG that was just done. Is it ok for pt to switch?

## 2018-06-02 ENCOUNTER — Ambulatory Visit (INDEPENDENT_AMBULATORY_CARE_PROVIDER_SITE_OTHER): Payer: PPO | Admitting: Internal Medicine

## 2018-06-02 ENCOUNTER — Encounter: Payer: Self-pay | Admitting: Internal Medicine

## 2018-06-02 ENCOUNTER — Other Ambulatory Visit (INDEPENDENT_AMBULATORY_CARE_PROVIDER_SITE_OTHER): Payer: PPO

## 2018-06-02 VITALS — BP 136/84 | HR 70 | Temp 97.6°F | Ht 73.0 in | Wt 210.0 lb

## 2018-06-02 DIAGNOSIS — R739 Hyperglycemia, unspecified: Secondary | ICD-10-CM | POA: Diagnosis not present

## 2018-06-02 DIAGNOSIS — G603 Idiopathic progressive neuropathy: Secondary | ICD-10-CM | POA: Diagnosis not present

## 2018-06-02 DIAGNOSIS — Z23 Encounter for immunization: Secondary | ICD-10-CM

## 2018-06-02 DIAGNOSIS — Z0000 Encounter for general adult medical examination without abnormal findings: Secondary | ICD-10-CM | POA: Diagnosis not present

## 2018-06-02 LAB — CBC WITH DIFFERENTIAL/PLATELET
Basophils Absolute: 0 10*3/uL (ref 0.0–0.1)
Basophils Relative: 0.4 % (ref 0.0–3.0)
Eosinophils Absolute: 0.1 10*3/uL (ref 0.0–0.7)
Eosinophils Relative: 1.5 % (ref 0.0–5.0)
HCT: 45.9 % (ref 39.0–52.0)
Hemoglobin: 15.5 g/dL (ref 13.0–17.0)
Lymphocytes Relative: 12.8 % (ref 12.0–46.0)
Lymphs Abs: 0.8 10*3/uL (ref 0.7–4.0)
MCHC: 33.6 g/dL (ref 30.0–36.0)
MCV: 95.8 fl (ref 78.0–100.0)
Monocytes Absolute: 0.6 10*3/uL (ref 0.1–1.0)
Monocytes Relative: 9 % (ref 3.0–12.0)
Neutro Abs: 4.8 10*3/uL (ref 1.4–7.7)
Neutrophils Relative %: 76.3 % (ref 43.0–77.0)
Platelets: 157 10*3/uL (ref 150.0–400.0)
RBC: 4.79 Mil/uL (ref 4.22–5.81)
RDW: 13.4 % (ref 11.5–15.5)
WBC: 6.2 10*3/uL (ref 4.0–10.5)

## 2018-06-02 LAB — BASIC METABOLIC PANEL
BUN: 27 mg/dL — ABNORMAL HIGH (ref 6–23)
CO2: 29 mEq/L (ref 19–32)
Calcium: 10 mg/dL (ref 8.4–10.5)
Chloride: 99 mEq/L (ref 96–112)
Creatinine, Ser: 1.9 mg/dL — ABNORMAL HIGH (ref 0.40–1.50)
GFR: 34.26 mL/min — ABNORMAL LOW (ref >60.00)
Glucose, Bld: 92 mg/dL (ref 70–99)
Potassium: 4.2 mEq/L (ref 3.5–5.1)
Sodium: 138 mEq/L (ref 135–145)

## 2018-06-02 LAB — HEPATIC FUNCTION PANEL
ALT: 18 U/L (ref 0–53)
AST: 16 U/L (ref 0–37)
Albumin: 4.5 g/dL (ref 3.5–5.2)
Alkaline Phosphatase: 85 U/L (ref 39–117)
Bilirubin, Direct: 0.1 mg/dL (ref 0.0–0.3)
Total Bilirubin: 0.5 mg/dL (ref 0.2–1.2)
Total Protein: 7 g/dL (ref 6.0–8.3)

## 2018-06-02 LAB — TSH: TSH: 2.3 u[IU]/mL (ref 0.35–4.50)

## 2018-06-02 LAB — LIPID PANEL
Cholesterol: 142 mg/dL (ref 0–200)
HDL: 35.8 mg/dL — ABNORMAL LOW (ref >39.00)
LDL Cholesterol: 87 mg/dL (ref 0–99)
NonHDL: 106.43
Total CHOL/HDL Ratio: 4
Triglycerides: 97 mg/dL (ref 0.0–149.0)
VLDL: 19.4 mg/dL (ref 0.0–40.0)

## 2018-06-02 LAB — HEMOGLOBIN A1C: Hgb A1c MFr Bld: 5.6 % (ref 4.6–6.5)

## 2018-06-02 NOTE — Patient Instructions (Addendum)
You had the Tdap tetanus shot today  Please continue all other medications as before, and refills have been done if requested.  Please have the pharmacy call with any other refills you may need.  Please continue your efforts at being more active, low cholesterol diet, and weight control.  You are otherwise up to date with prevention measures today.  Please keep your appointments with your specialists as you may have planned  You will be contacted regarding the referral for: Neurology  Please go to the LAB in the Basement (turn left off the elevator) for the tests to be done today  You will be contacted by phone if any changes need to be made immediately.  Otherwise, you will receive a letter about your results with an explanation, but please check with MyChart first.  Please remember to sign up for MyChart if you have not done so, as this will be important to you in the future with finding out test results, communicating by private email, and scheduling acute appointments online when needed.  Please return in 6 months, or sooner if needed

## 2018-06-02 NOTE — Assessment & Plan Note (Signed)
stable overall by history and exam, recent data reviewed with pt, and pt to continue medical treatment as before,  to f/u any worsening symptoms or concerns  

## 2018-06-02 NOTE — Assessment & Plan Note (Signed)

## 2018-06-02 NOTE — Progress Notes (Signed)
Subjective:    Patient ID: Jeffrey Hardy, male    DOB: March 21, 1936, 83 y.o.   MRN: [id number]  HPI  Here for wellness and f/u;  Overall doing ok;  Pt denies Chest pain, worsening SOB, DOE, wheezing, orthopnea, PND, worsening LE edema, palpitations, dizziness or syncope.  Pt denies neurological change such as new headache, facial or extremity weakness.  Pt denies polydipsia, polyuria, or low sugar symptoms. Pt states overall good compliance with treatment and medications, good tolerability, and has been trying to follow appropriate diet.  Pt denies worsening depressive symptoms, suicidal ideation or panic. No fever, night sweats, wt loss, loss of appetite, or other constitutional symptoms.  Pt states good ability with ADL's, has low fall risk, home safety reviewed and adequate, no other significant changes in hearing or vision, and only occasionally active with exercise. On iron per Dr Deterding/renal, plans to f/u at 4 mo.  S/p right and left hip THR per Dr Linus Mako in 2019.  Has known neuropathy to RLE at the knee, has had feb 3 EMG c/w this; has been referred to [facility] neurology but has not been able to actually get an appt, just getting the "put-off." per wife.  Has been referred but neuro says has not received.  She cannot wait any longer Past Medical History:  Diagnosis Date  . Anxiety   . Bilateral renal cysts   . Cancer ([place])    renal mass  . Depression   . Diverticulosis of colon   . Erectile dysfunction   . Hematuria   . History of cerebral embolic infarction x2 CVA's ---  neurologist-  [name]--- per pt residual abnormal gait , does need cane   12-16-2011  right frontal embolic infarct and [id number] left frontal embolic infarct (per first MRI previous hemorrgaic cerebral ischemia)  . History of duodenal ulcer    1997  . History of Helicobacter pylori infection    1997  . History of hiatal hernia   . History of loop recorder    Current  . History of syncope    1997  and  2014  s/p  loop recorder  . Hyperlipidemia   . Hypertension   . Nocturia   . PAF (paroxysmal atrial fibrillation) [facility])    cardiologist-  dr [name]  . Peripheral neuropathy 05/04/2018  . Renal cell cancer ([place]) 09/06/2016  . Right renal mass   . Status post placement of implantable loop recorder    07-06-2012  . Stroke [facility])    2013 2 mild strokes mild weakness bil sides  . Urgency of urination   . Wears hearing aid    right ear only   Past Surgical History:  Procedure Laterality Date  . APPENDECTOMY  age 28  . COLONOSCOPY  last one 07-17-2015  . CYSTOSCOPY/RETROGRADE/URETEROSCOPY Right 04/22/2016   Procedure: CYSTOSCOPY/RETROGRADE/URETEROSCOPY/ BIOPSY/ STENT PLACEMENT;  Surgeon: [name], MD;  Location: [facility];  Service: Urology;  Laterality: Right;  . ESOPHAGOGASTRODUODENOSCOPY  last one 09-16-2014  . LOOP RECORDER IMPLANT N/A 07/06/2012   Procedure: LOOP RECORDER IMPLANT;  Surgeon: [name], MD;  Location: [facility] CATH LAB;  Service: Cardiovascular;  Laterality: N/A;  . ROBOT ASSITED LAPAROSCOPIC NEPHROURETERECTOMY Right 05/13/2016   Procedure: XI ROBOT ASSITED LAPAROSCOPIC NEPHROURETERECTOMY;  Surgeon: [name], MD;  [facility];  Service: Urology;  Laterality: Right;  . TEE WITHOUT CARDIOVERSION  12/19/2011   Procedure: TRANSESOPHAGEAL ECHOCARDIOGRAM (TEE);  Surgeon: [name], MD;  Location: [facility]  ENDOSCOPY;  Service: Cardiovascular;  Laterality: N/A;  no evidence thrombus in the atrial cavity or appendage;  mild AV thickened w/ mild AI;  minimal fixed plaque in thoracic aorta;  mild MV thickened w/ mild MR;  NO pfo by color doppler or with injection of agitated saline  . TOTAL HIP ARTHROPLASTY Left 09/07/2017   Procedure: TOTAL HIP ARTHROPLASTY ANTERIOR APPROACH;  Surgeon: Rod Can, MD;  Location: [facility];  Service: Orthopedics;  Laterality: Left;  . TOTAL HIP ARTHROPLASTY Right 02/01/2018   Procedure: TOTAL HIP ARTHROPLASTY ANTERIOR  APPROACH;  Surgeon: Rod Can, MD;  Location: [place];  Service: Orthopedics;  Laterality: Right;  . TRANSTHORACIC ECHOCARDIOGRAM  09-10-20103   grade 1 diastolic dysfunction,  ef 55-60%/  AV sclerosis without stenosis/  trivial TR  . WRIST FRACTURE SURGERY Right 2014  approx.   retained hardware    reports that he quit smoking about 51 years ago. His smoking use included cigarettes and pipe. He quit after 5.00 years of use. He has never used smokeless tobacco. He reports that he does not drink alcohol or use drugs. family history includes Brain cancer in his father; Dementia in his brother; Heart Problems in his mother; Parkinsonism in his brother. Allergies  Allergen Reactions  . Acyclovir And Related Other (See Comments)    Unsure of what happens   Current Outpatient Medications on File Prior to Visit  Medication Sig Dispense Refill  . B Complex Vitamins (VITAMIN B COMPLEX PO) Take by mouth.    . Cholecalciferol (VITAMIN D3) 5000 units CAPS Take 1 capsule by mouth daily.    [name] ELIQUIS 2.5 MG TABS tablet Take 1 tablet by mouth 2 (two) times daily.    . ferrous sulfate 325 (65 FE) MG tablet Take 325 mg by mouth daily with breakfast.    . tamsulosin (FLOMAX) 0.4 MG CAPS capsule Take 1 capsule (0.4 mg total) by mouth daily after breakfast. 90 capsule 3   No current facility-administered medications on file prior to visit.    Review of Systems Constitutional: Negative for other unusual diaphoresis, sweats, appetite or weight changes HENT: Negative for other worsening hearing loss, ear pain, facial swelling, mouth sores or neck stiffness.   Eyes: Negative for other worsening pain, redness or other visual disturbance.  Respiratory: Negative for other stridor or swelling Cardiovascular: Negative for other palpitations or other chest pain  Gastrointestinal: Negative for worsening diarrhea or loose stools, blood in stool, distention or other pain Genitourinary: Negative for hematuria, flank  pain or other change in urine volume.  Musculoskeletal: Negative for myalgias or other joint swelling.  Skin: Negative for other color change, or other wound or worsening drainage.  Neurological: Negative for other syncope or numbness. Hematological: Negative for other adenopathy or swelling Psychiatric/Behavioral: Negative for hallucinations, other worsening agitation, SI, self-injury, or new decreased concentration All other system neg per pt    Objective:   Physical Exam BP 136/84   Pulse 70   Temp 97.6 F (36.4 C) (Oral)   Ht 6\' 1"  (1.854 m)   Wt 210 lb (95.3 kg)   SpO2 98%   BMI 27.71 kg/m  VS noted,  Constitutional: Pt is oriented to person, place, and time. Appears well-developed and well-nourished, in no significant distress and comfortable Head: Normocephalic and atraumatic  Eyes: Conjunctivae and EOM are normal. Pupils are equal, round, and reactive to light Right Ear: External ear normal without discharge Left Ear: External ear normal without discharge Nose: Nose without discharge or deformity  Mouth/Throat: Oropharynx is without other ulcerations and moist  Neck: Normal range of motion. Neck supple. No JVD present. No tracheal deviation present or significant neck LA or mass Cardiovascular: Normal rate, regular rhythm, normal heart sounds and intact distal pulses.   Pulmonary/Chest: WOB normal and breath sounds without rales or wheezing  Abdominal: Soft. Bowel sounds are normal. NT. No HSM  Musculoskeletal: Normal range of motion. Exhibits no edema Lymphadenopathy: Has no other cervical adenopathy.  Neurological: Pt is alert and oriented to person, place, and time. Pt has normal reflexes. No cranial nerve deficit. Motor grossly intact, decreased sensation to LEs to light touch Skin: Skin is warm and dry. No rash noted or new ulcerations Psychiatric:  Has normal mood and affect. Behavior is normal without agitation No other exam findings  Lab Results  Component Value  Date   WBC 6.6 02/08/2018   HGB 8.6 (L) 02/08/2018   HCT 28.4 (L) 02/08/2018   PLT 165 02/08/2018   GLUCOSE 102 (H) 02/08/2018   CHOL 146 11/03/2017   TRIG 87.0 11/03/2017   HDL 38.40 (L) 11/03/2017   LDLCALC 90 11/03/2017   ALT 43 02/07/2018   AST 27 02/07/2018   NA 141 02/08/2018   K 4.3 02/08/2018   CL 110 02/08/2018   CREATININE 1.74 (H) 02/08/2018   BUN 33 (H) 02/08/2018   CO2 24 02/08/2018   TSH 1.063 02/02/2018   PSA 0.90 11/03/2017   INR 1.19 01/29/2018   HGBA1C 5.1 11/03/2017       Assessment & Plan:

## 2018-06-02 NOTE — Assessment & Plan Note (Addendum)
For different neurology referral per exasperated wife

## 2018-06-04 ENCOUNTER — Telehealth: Payer: Self-pay | Admitting: Neurology

## 2018-06-04 NOTE — Telephone Encounter (Signed)
Yes that is fine if Dr. Rolena Infante requested it thanks

## 2018-06-04 NOTE — Telephone Encounter (Signed)
Okay to switch if Dr. Jaynee Eagles agrees.

## 2018-06-04 NOTE — Telephone Encounter (Signed)
Inez Catalina had sent a message on 2/17 regarding this, just wanted to follow up: Per Dr. Rolena Infante is requesting patient see Dr. Jaynee Eagles to follow up Nerve Conduction EMG that was just done. Is it ok for pt to switch?

## 2018-06-08 ENCOUNTER — Encounter: Payer: Self-pay | Admitting: Neurology

## 2018-06-08 ENCOUNTER — Ambulatory Visit: Payer: PPO | Admitting: Neurology

## 2018-06-08 ENCOUNTER — Institutional Professional Consult (permissible substitution): Payer: PPO | Admitting: Neurology

## 2018-06-08 VITALS — BP 141/79 | HR 76 | Ht 73.0 in | Wt 201.4 lb

## 2018-06-08 DIAGNOSIS — F0150 Vascular dementia without behavioral disturbance: Secondary | ICD-10-CM

## 2018-06-08 DIAGNOSIS — G5731 Lesion of lateral popliteal nerve, right lower limb: Secondary | ICD-10-CM | POA: Diagnosis not present

## 2018-06-08 DIAGNOSIS — G214 Vascular parkinsonism: Secondary | ICD-10-CM | POA: Diagnosis not present

## 2018-06-08 DIAGNOSIS — R269 Unspecified abnormalities of gait and mobility: Secondary | ICD-10-CM | POA: Diagnosis not present

## 2018-06-08 DIAGNOSIS — F0390 Unspecified dementia without behavioral disturbance: Secondary | ICD-10-CM

## 2018-06-08 DIAGNOSIS — W19XXXA Unspecified fall, initial encounter: Secondary | ICD-10-CM

## 2018-06-08 NOTE — Progress Notes (Signed)
[facility]    Provider:  Dr Jaynee Eagles Referring Provider:  [facility], Dr. Melina Schools Primary Care Provider:  Biagio Borg, MD  CC:  Foot Drop, walking abnormality  HPI:  Jeffrey Hardy is a 83 y.o. male here as requested by provider for foot drop.  Patient was initially seen by 1 of my colleagues in the office and also had an EMG nerve conduction study done here.  He has a past medical history of peripheral neuropathy, atrial fibrillation, chronic kidney disease, syncope, hip fracture, memory impairment, herpes zoster, cerebral atherosclerosis, hyperlipidemia, essential hypertension.  Patient with a right foot drop since the beginning of October, at the end of October he fell as a result of the foot drop and fractured his hip.  [facility] evaluated [facility] and no significant spinal pathology was seen to account for his right foot drop.  MRI of the lumbar spine was also completed and part of this evaluation.  Patient has no significant back pain and no high-grade significant neural compression on his MRI that would cause the foot drop. Here with wife who provides most information. Foot dropped started early October/late September. Wife noticed slapping of the foot. She noticed it first time in the morning. He slept in his bed and slept on his back. He has had 2 hip surgeries. He was not allowed to sleep on your side, he may stay in the same position all night. Hip surgery was months prior. The next hip surgery was after the foot drop. They went to physical therapy. Did not improve. Right leg has the foot drop. He has B12 deficiency and just started to take b12. No numbness and tingling in the feet. MRI of the lumbar spine was negative. Walking difficulty started in September. Having difficulty getting up. Doesn't move like he did. No tremor. His posture is worsening . Everything started after the first hip replacement. Memory loss ongoing since stroke, progressive memory  loss and slowing.   Reviewed notes, labs and imaging from outside physicians, which showed:  Reviewed notes from Dr. Melina Schools.  This is an 83 year old male.  Distally he has a partial foot drop unchanged from prior.  He has palpable pulses.  No significant pedal edema.  No calf tenderness to palpitation.  EMG nerve conduction study revealed mixed peripheral neuropathy.  He is status post right total hip arthroplasty for displaced femoral neck fracture doing well.  On physical exam he states no numbness tingling or difficulty with balance.  Patient has an obvious foot drop with 3 out of 5 EHL tibialis anterior gastrocnemius strength on the right side.  He also has 4-5 hip flexor and quad strength in the right side.  Left is 5 out of 5.  He does complain of some numbness in the L5 and S1 dermatome on the right side but no significant radicular pain in the leg.  Negative Babinski, no clonus, 1+ deep tendon reflexes at the knees and absent at the Achilles.  MRI of the lumbar spine October 2019 showed moderate to significant disc osteophyte complex asymmetric to the right but no spinal canal stenosis.  Mild foraminal narrowing.  Mild degenerative changes at L4-L5 but no significant stenosis.  Similar findings at L2-L3 and L3-L4.  There is also findings of lesions in the right and left lobes of the liver as well as cysts in the kidney.  Foot drop apparently occurred in the early part of October, and he is fallen since then  as a result of the foot drop and fractured his hip.  There is no significant spinal pathology that would account for the right foot drop.  Reviewed nerve conduction study, images and data and agree with the following: The distal motor latencies for the peroneal nerves were prolonged on the right and normal on the left with a low motor amplitude on the right normal on the left.  The distal motor latencies for the posterior tibial nerves were prolonged on the right, normal on the left with a  low motor amplitude on the right and normal on the left.  Slowing was seen for the peroneal nerves bilaterally much more significant on the right peroneal nerve.  There was slowing for the right posterior tib nerve, borderline normal on the left and unobtainable for the peroneal nerves bilaterally.  The F-wave latencies per the posterior tibial nerves were prolonged bilaterally. After review of the data I would also add that there is a drop across the fibular head greater than 10 m/s on the right accounting for the asymmetries and localizing at the fibular head.  MRI of the brain in 2014 showed extensive white matter changes, advanced microvascular chronic ischemic changes and global atrophy.  At that time he also had an acute small infarct in the posterior right frontal lobe involving the medial aspect of the right motor strip.  Also other remote infarcts bilaterally.  Review of Systems: Patient complains of symptoms per HPI as well as the following symptoms: falls. Pertinent negatives and positives per HPI. All others negative.   Social History   Socioeconomic History  . Marital status: Married    Spouse name: [name]   . Number of children: 2  . Years of education: Not on file  . Highest education level: Some college, no degree  Occupational History  . Occupation: retired  Scientific laboratory technician  . Financial resource strain: Not on file  . Food insecurity:    Worry: Not on file    Inability: Not on file  . Transportation needs:    Medical: Not on file    Non-medical: Not on file  Tobacco Use  . Smoking status: Former Smoker    Years: 5.00    Types: Cigarettes, Pipe    Last attempt to quit: 09/07/1966    Years since quitting: 51.7  . Smokeless tobacco: Never Used  Substance and Sexual Activity  . Alcohol use: No    Alcohol/week: 0.0 standard drinks  . Drug use: No  . Sexual activity: Yes  Lifestyle  . Physical activity:    Days per week: Not on file    Minutes per session: Not on file    . Stress: Not on file  Relationships  . Social connections:    Talks on phone: Not on file    Gets together: Not on file    Attends religious service: Not on file    Active member of club or organization: Not on file    Attends meetings of clubs or organizations: Not on file    Relationship status: Not on file  . Intimate partner violence:    Fear of current or ex partner: Not on file    Emotionally abused: Not on file    Physically abused: Not on file    Forced sexual activity: Not on file  Other Topics Concern  . 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 Patient is retired from [employer]   Left handed  2 cups of caffeine per day    Lives at home with wife     Family History  Problem Relation Age of Onset  . Heart Problems Mother   . Brain cancer Father   . Parkinsonism Brother   . Dementia Brother   . Colon cancer Neg Hx   . Esophageal cancer Neg Hx   . Stomach cancer Neg Hx   . Gastric cancer Neg Hx   . Liver disease Neg Hx   . Kidney disease Neg Hx   . Diabetes Neg Hx     Past Medical History:  Diagnosis Date  . Anxiety   . Bilateral renal cysts   . Cancer ([place])    renal mass  . Depression   . Diverticulosis of colon   . Erectile dysfunction   . Hematuria   . History of cerebral embolic infarction x2 CVA's ---  neurologist-  [name]--- per pt residual abnormal gait , does need cane   12-16-2011  right frontal embolic infarct and [id number] left frontal embolic infarct (per first MRI previous hemorrgaic cerebral ischemia)  . History of duodenal ulcer    1997  . History of Helicobacter pylori infection    1997  . History of hiatal hernia   . History of loop recorder    Current  . History of syncope    1997  and 2014  s/p  loop recorder  . Hyperlipidemia   . Hypertension   . Nocturia   . PAF (paroxysmal atrial fibrillation) [facility])    cardiologist-  dr [name]  . Peripheral neuropathy 05/04/2018  . Renal cell cancer ([place])  09/06/2016  . Right renal mass   . Status post placement of implantable loop recorder    07-06-2012  . Stroke [facility])    2013 2 mild strokes mild weakness bil sides  . Urgency of urination   . Wears hearing aid    right ear only    Patient Active Problem List   Diagnosis Date Noted  . Peripheral neuropathy 05/04/2018  . Syncope 02/06/2018  . Closed displaced fracture of right femoral neck ([place]) 02/01/2018  . PAF (paroxysmal atrial fibrillation) ([place]) 01/30/2018  . Closed right hip fracture, initial encounter ([place]) 01/29/2018  . CKD (chronic kidney disease) stage 3, GFR 30-59 ml/min ([facility]) 09/04/2017  . Hip fracture ([place]) 09/04/2017  . Renal cell cancer ([place]) 09/06/2016  . Urinary frequency 03/08/2016  . Memory dysfunction 03/08/2016  . History of colonic polyps 07/07/2015  . Hyperglycemia 07/04/2015  . Cerebrovascular disease, arteriosclerotic, post-stroke 12/28/2013  . Shingles 06/29/2013  . Incontinent of urine 12/29/2012  . Thrombocytopenia ([place]) 01/13/2012  . 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 Preventative health care 07/27/2010  . Hyperlipidemia 05/12/2008  . Essential hypertension 05/12/2008  . GERD 05/12/2008  . DIVERTICULOSIS, COLON 05/12/2008    Past Surgical History:  Procedure Laterality Date  . APPENDECTOMY  age 54  . COLONOSCOPY  last one 07-17-2015  . CYSTOSCOPY/RETROGRADE/URETEROSCOPY Right 04/22/2016   Procedure: CYSTOSCOPY/RETROGRADE/URETEROSCOPY/ BIOPSY/ STENT PLACEMENT;  Surgeon: [name], MD;  Location: [facility];  Service: Urology;  Laterality: Right;  . ESOPHAGOGASTRODUODENOSCOPY  last one 09-16-2014  . LOOP RECORDER IMPLANT N/A 07/06/2012   Procedure: LOOP RECORDER IMPLANT;  Surgeon: [name], MD;  Location: [facility] CATH LAB;  Service: Cardiovascular;  Laterality: N/A;  . ROBOT ASSITED LAPAROSCOPIC NEPHROURETERECTOMY Right 05/13/2016   Procedure: XI ROBOT ASSITED LAPAROSCOPIC NEPHROURETERECTOMY;  Surgeon: [name], MD;   Location: [facility];  Service: Urology;  Laterality: Right;  . TEE WITHOUT CARDIOVERSION  12/19/2011   Procedure: TRANSESOPHAGEAL ECHOCARDIOGRAM (TEE);  Surgeon: [name], MD;  Location: [facility] ENDOSCOPY;  Service: Cardiovascular;  Laterality: N/A;  no evidence thrombus in the atrial cavity or appendage;  mild AV thickened w/ mild AI;  minimal fixed plaque in thoracic aorta;  mild MV thickened w/ mild MR;  NO pfo by color doppler or with injection of agitated saline  . TOTAL HIP ARTHROPLASTY Left 09/07/2017   Procedure: TOTAL HIP ARTHROPLASTY ANTERIOR APPROACH;  Surgeon: Rod Can, MD;  Location: [facility];  Service: Orthopedics;  Laterality: Left;  . TOTAL HIP ARTHROPLASTY Right 02/01/2018   Procedure: TOTAL HIP ARTHROPLASTY ANTERIOR APPROACH;  Surgeon: Rod Can, MD;  Location: [place];  Service: Orthopedics;  Laterality: Right;  . TRANSTHORACIC ECHOCARDIOGRAM  09-10-20103   grade 1 diastolic dysfunction,  ef 55-60%/  AV sclerosis without stenosis/  trivial TR  . WRIST FRACTURE SURGERY Right 2014  approx.   retained hardware    Current Outpatient Medications  Medication Sig Dispense Refill  . B Complex Vitamins (VITAMIN B COMPLEX PO) Take by mouth.    . Cholecalciferol (VITAMIN D3) 5000 units CAPS Take 1 capsule by mouth daily.    . Cyanocobalamin (VITAMIN B-12 PO) Take by mouth.    . Cyanocobalamin (VITAMIN B-12) 1000 MCG/15ML LIQD Take by mouth.    Arne Cleveland 2.5 MG TABS tablet Take 1 tablet by mouth 2 (two) times daily.    . ferrous sulfate 325 (65 FE) MG tablet Take 325 mg by mouth daily with breakfast.    . tamsulosin (FLOMAX) 0.4 MG CAPS capsule Take 1 capsule (0.4 mg total) by mouth daily after breakfast. 90 capsule 3   No current facility-administered medications for this visit.     Allergies as of 06/08/2018 - Review Complete 06/08/2018  Allergen Reaction Noted  . Acyclovir and related Other (See Comments) 05/09/2016    Vitals: BP (!) 141/79 (BP Location: Right Arm, Patient  Position: Sitting, Cuff Size: Normal)   Pulse 76   Ht 6\' 1"  (1.854 m)   Wt 201 lb 6 oz (91.3 kg)   BMI 26.57 kg/m  Last Weight:  Wt Readings from Last 1 Encounters:  06/08/18 201 lb 6 oz (91.3 kg)   Last Height:   Ht Readings from Last 1 Encounters:  06/08/18 6\' 1"  (1.854 m)     Physical exam: Exam: Gen: NAD, conversant, pleasant                    CV: RRR, no MRG. No Carotid Bruits. No peripheral edema, warm, nontender Eyes: Conjunctivae clear without exudates or hemorrhage  Neuro: Detailed Neurologic Exam  Speech:    Speech is normal; fluent and spontaneous with impaired comprehension.  Cognition:    The patient is oriented to person     recent and remote memory impaired;     language fluent;     Impaired attention, concentration, fund of knowledge Cranial Nerves: hypomimia/masked facies     The pupils are equal, round, and reactive to light.  Attempted funduscopic exam could not visualize due to small pupils. Visual fields are full to finger confrontation. Extraocular movements are intact. Trigeminal sensation is intact and the muscles of mastication are normal. The face is symmetric. The palate elevates in the midline. Hearing intact. Voice is normal. Shoulder shrug is normal. The tongue has normal motion without fasciculations.   Coordination:    No dysmetria.  Gait:    Decreased arm swing right > left, stooped posture, narrow gait and low clearance slightly shuffling.  Motor Observation:    no involuntary movements noted, no tremor Tone:    Normal muscle tone.      Strength: right DF 3/5. Good inversion of foot. Slight weakness eversion of foot. 4/5 right dorsiflexion.  Otherwise strength is V/V in the upper and lower limbs.      Sensation: intact to LT     Reflex Exam:  DTR's:   Absent AJs.  Otherwise deep tendon reflexes in the upper and lower extremities are normal bilaterally.   Toes:    The toes are equiv bilaterally.   Clonus:    Clonus is  absent.    Assessment/Plan: 83 y.o. male here as requested by Dr. Rolena Infante for foot drop.  Patient was initially seen by 1 of my colleagues in the office and also had an EMG nerve conduction study done here.  He has a past medical history of peripheral neuropathy, atrial fibrillation, chronic kidney disease, syncope, hip fracture, memory impairment, herpes zoster, cerebral atherosclerosis, hyperlipidemia, essential hypertension.  Patient with a right foot drop since the beginning of October    - EMG shows a drop across the fibular head greater than 10 m/s on the right accounting for the asymmetries and localizing foot drop at the fibular head. -However I do not think that patient's foot drop or generalized neuropathy on exam are causing what they are most concerned about which is his gait.  MRI of the brain showed extensive white matter changes consistent with advanced chronic ischemic microvascular changes as well as global atrophy.  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 -Discussed findings with patient and wife.  We will order several studies such as MRI of the brain to to follow his atrophy and white matter changes.  Also MRI of the cervical spine to ensure no cervical pathology for his gait abnormalities.   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 - Neuropsych testing for Vascular dementia vs Lewy Body. Likely vascular. -We can order right knee x-ray for the peroneal neuropathy. -He has had work-up that his primary care including B12 and other neuropathy labs, at this time will focus more in the gait abnormality discussed above.  Orders Placed This Encounter  Procedures  . MR BRAIN WO CONTRAST  . MR CERVICAL SPINE WO CONTRAST  . DG Knee 3 Views Right  . Ambulatory referral to Neuropsychology   Cc: Biagio Borg,  MD,  [facility], [facility] [address] [place], [place] [address]  Phone [phone] Fax [phone]

## 2018-06-08 NOTE — Patient Instructions (Signed)
MRI of the brain and cervical spine DAT Scan for Dopamine in the brain Right knee xray Request records from primary care and may need more labwork after review Formal memory testing  Common Peroneal Nerve Entrapment  Common peroneal nerve entrapment is a condition that can make it hard to lift a foot. The condition results from pressure on a nerve in the lower leg called the common peroneal nerve. Your common peroneal nerve provides feeling to your outer lower leg and foot. It also supplies the muscles that move your foot and toes upward and outward. What are the causes? This condition may be caused by:  A hard, direct hit to the side of the lower leg.  Swelling from a knee injury.  A break (fracture) in one of the lower leg bones.  Wearing a boot or cast that ends just below the knee.  A growth or cyst near the nerve. What increases the risk? This condition is more likely to develop in people who play:  Contact sports, such as football or hockey.  Sports in which you wear high and stiff boots, such as skiing. What are the signs or symptoms? Symptoms of this condition include:  Trouble lifting your foot up.  Tripping often.  Your foot hitting the ground harder than normal as you walk.  Numbness, tingling, or pain in the outside of the knee, outside of the lower leg, and top of the foot.  Sensitivity to pressure on the front or side of the leg. How is this diagnosed? This condition may be diagnosed based on:  Your symptoms.  Your medical history.  A physical exam.  Tests, such as: ? An X-ray to check the bones of your knee and leg. ? MRI to check tendons that attach to the side of your knee. ? An electromyogram (EMG) to check your nerves. During your physical exam, your health care provider will check for numbness in your leg and test the strength of your lower leg muscles. He or she may tap the side of your lower leg to see if that causes tingling. How is this  treated? Treatment for this condition may include:  Avoiding activities that make symptoms worse.  Using a brace to hold up your foot and toes.  Taking anti-inflammatory pain medicines to relieve swelling and reduce pain.  Having medicines injected into your ankle joint to reduce pain and swelling.  Physical therapy. This involves doing exercises.  Returning gradually to full activity.  Surgery to take pressure off the nerve. This may be needed if there is no improvement after 2-3 months or if there is a growth pushing on the nerve. Follow these instructions at home: If you have a brace:  Wear it as told by your health care provider. Remove it only as told by your health care provider.  Loosen the brace if your toes tingle, become numb, or turn cold and blue.  Keep the brace clean.  If the brace is not waterproof: ? Do not let it get wet. ? Cover it with a watertight covering when you take a bath or a shower.  Ask your health care provider when it is safe to drive with a brace on your foot. Activity  Return to your normal activities as told by your health care provider. Ask your health care provider what activities are safe for you.  Do not do any activities that make pain or swelling worse.  Do exercises as told by your health care provider. General instructions  Take over-the-counter and prescription medicines only as told by your health care provider.  Do not put your full weight on your knee until your health care provider says you can.  Keep all follow-up visits as told by your health care provider. This is important. How is this prevented?  Wear supportive footwear that is appropriate for your athletic activity.  Avoid athletic activities that cause ankle pain or swelling.  Wear protective padding over your lower legs when playing contact sports.  Make sure your boots do not put extra pressure on the area just below your knees.  Do not sit cross-legged for  long periods of time. Contact a health care provider if:  Your symptoms do not get better in 2-3 months.  The weakness or numbness in your leg or foot gets worse. This information is not intended to replace advice given to you by your health care provider. Make sure you discuss any questions you have with your health care provider. Document Released: 03/25/2005 Document Revised: 11/28/2015 Document Reviewed: 02/10/2015 Elsevier Interactive Patient Education  2019 [employer].

## 2018-06-09 ENCOUNTER — Telehealth: Payer: Self-pay | Admitting: Internal Medicine

## 2018-06-09 DIAGNOSIS — G20C Parkinsonism, unspecified: Secondary | ICD-10-CM | POA: Insufficient documentation

## 2018-06-09 DIAGNOSIS — G219 Secondary parkinsonism, unspecified: Secondary | ICD-10-CM

## 2018-06-09 DIAGNOSIS — G20 Parkinson's disease: Secondary | ICD-10-CM | POA: Insufficient documentation

## 2018-06-09 NOTE — Telephone Encounter (Signed)
Copied from [place] [id number]. Topic: Referral - Status >> Jun 09, 2018 10:36 AM Alanda Slim E wrote: Reason for CRM:  Pt was able to be seen by [facility] neurology and doesn't need to referral to [facility]

## 2018-06-10 ENCOUNTER — Other Ambulatory Visit: Payer: Self-pay | Admitting: *Deleted

## 2018-06-10 ENCOUNTER — Telehealth: Payer: Self-pay | Admitting: Neurology

## 2018-06-10 DIAGNOSIS — G20 Parkinson's disease: Secondary | ICD-10-CM

## 2018-06-10 NOTE — Telephone Encounter (Signed)
health team order sent to GI. No auth they will reach out to the pt to schedule.  °

## 2018-06-10 NOTE — Telephone Encounter (Signed)
Can you place order for DAT Scan . Thanks Hinton Dyer.

## 2018-06-10 NOTE — Telephone Encounter (Signed)
I spoke with Dr. Jaynee Eagles and placed the order for the DAT scan. Referral form from [facility] also ready for MD signature.

## 2018-06-18 ENCOUNTER — Ambulatory Visit
Admission: RE | Admit: 2018-06-18 | Discharge: 2018-06-18 | Disposition: A | Discharge status: Home / Self Care | Payer: PPO | Source: Ambulatory Visit | Attending: Neurology | Admitting: Neurology

## 2018-06-18 ENCOUNTER — Other Ambulatory Visit: Payer: Self-pay

## 2018-06-18 DIAGNOSIS — R269 Unspecified abnormalities of gait and mobility: Secondary | ICD-10-CM | POA: Diagnosis not present

## 2018-06-18 DIAGNOSIS — G214 Vascular parkinsonism: Secondary | ICD-10-CM

## 2018-06-18 DIAGNOSIS — F0150 Vascular dementia without behavioral disturbance: Secondary | ICD-10-CM

## 2018-06-18 DIAGNOSIS — W19XXXA Unspecified fall, initial encounter: Secondary | ICD-10-CM

## 2018-06-22 ENCOUNTER — Telehealth: Payer: Self-pay | Admitting: Neurology

## 2018-06-22 DIAGNOSIS — I639 Cerebral infarction, unspecified: Secondary | ICD-10-CM | POA: Diagnosis not present

## 2018-06-22 DIAGNOSIS — D696 Thrombocytopenia, unspecified: Secondary | ICD-10-CM | POA: Diagnosis not present

## 2018-06-22 DIAGNOSIS — I129 Hypertensive chronic kidney disease with stage 1 through stage 4 chronic kidney disease, or unspecified chronic kidney disease: Secondary | ICD-10-CM | POA: Diagnosis not present

## 2018-06-22 DIAGNOSIS — D631 Anemia in chronic kidney disease: Secondary | ICD-10-CM | POA: Diagnosis not present

## 2018-06-22 DIAGNOSIS — N183 Chronic kidney disease, stage 3 (moderate): Secondary | ICD-10-CM | POA: Diagnosis not present

## 2018-06-22 DIAGNOSIS — I480 Paroxysmal atrial fibrillation: Secondary | ICD-10-CM | POA: Diagnosis not present

## 2018-06-22 DIAGNOSIS — N2581 Secondary hyperparathyroidism of renal origin: Secondary | ICD-10-CM | POA: Diagnosis not present

## 2018-06-22 DIAGNOSIS — N2889 Other specified disorders of kidney and ureter: Secondary | ICD-10-CM | POA: Diagnosis not present

## 2018-06-22 DIAGNOSIS — E785 Hyperlipidemia, unspecified: Secondary | ICD-10-CM | POA: Diagnosis not present

## 2018-06-22 DIAGNOSIS — Q600 Renal agenesis, unilateral: Secondary | ICD-10-CM | POA: Diagnosis not present

## 2018-06-22 DIAGNOSIS — Z0000 Encounter for general adult medical examination without abnormal findings: Secondary | ICD-10-CM | POA: Diagnosis not present

## 2018-06-22 DIAGNOSIS — C649 Malignant neoplasm of unspecified kidney, except renal pelvis: Secondary | ICD-10-CM | POA: Diagnosis not present

## 2018-06-22 NOTE — Telephone Encounter (Signed)
Please call patient, MRI of the brain showed evidence of old stroke involving left posterior parietal, thalamus, corona radiata, brainstem, and bilateral cerebellum hemisphere, there was no acute abnormality.  There is moderate confluent microvascular ischemic changes, and cerebral atrophy.  Compared to previous MRI of the brain 2013, the stroke at the left parietal, and thalamus appears to be new,  MRI of the cervical spine showed multilevel degenerative changes, with evidence of mild to moderate canal narrowing at C2-3, C3-4, variable degree of foraminal narrowing at different levels, no cord signal changes,  He has a follow-up with Dr. Jaynee Eagles on September 14, 2018, if he has significant worsening of his symptoms, consider move up his appointment   IMPRESSION: Abnormal MRI scan of the brain showing multiple remote age infarcts involving left posterior parietal lobe, left thalamus, left corona radiata, brainstem as well as bilateral cerebellar hemispheres.  No acute infarct is noted.  There are moderate changes of chronic microvascular ischemia and generalized cerebral atrophy.  Compared with previous MRI from 2013 the left parietal and thalamic infarcts appear to be new.  Changes of small vessel disease and atrophy also appear to be more advanced.   IMPRESSION: Abnormal MRI scan cervical spine showing prominent spondylitic changes throughout with mild canal narrowing at C2-3, C3-4 and moderate right-sided foraminal narrowing at C6-7 but without definite compression.

## 2018-06-22 NOTE — Telephone Encounter (Signed)
Called pt's wife [name] (on [place]) and LVM asking for call back to review results. Left office number & hours in message.

## 2018-06-23 NOTE — Telephone Encounter (Signed)
[name] [name] returned my call. Discussed results of pt's MRI brain and MRI c-spine. She verbalized understanding and appreciation. Discussed that pt has a f/u on June 8,2020. If he has significant worsening of his symptoms, let us know and we can consider moving up his appt.  Also discussed that while nothing was shown as acute on the MRI, reviewed stroke s/s and advised to call 911 if pt were to have these symptoms. She verbalized understanding. She stated pt's dat scan scheduled for 07/09/2018 and appt with Dr. Sima Matas is on 09/15/2018. She stated she was told if Dr. Jaynee Eagles didn't feel like pt needed the testing when he was seen by her on 6/8 that they could cancel. I told her that I would send a message to Dr. Jaynee Eagles to review when she returns to office. She verbalized appreciation and understanding.

## 2018-06-23 NOTE — Telephone Encounter (Signed)
Spoke with pt's wife & discussed Dr. Cathren Laine message. I r/s pt to Thurs 07/16/2018 @ 2:00 arrival 15-30 minutes prior. I advised her that d/t current situation with coronavirus if she or patient become ill to please r/s their new appointment no penalty. She verbalized understanding and appreciation.

## 2018-06-23 NOTE — Telephone Encounter (Signed)
I would definitely kepe the neuropsych testing in June. You can put him on my schedule on one of those days in April I am opening 4/7-4/10 and at that time we will have the DAT scan back to discuss. But regardless I recommend the neuropsych testing but we can discuss in April thanks

## 2018-07-09 ENCOUNTER — Ambulatory Visit (HOSPITAL_COMMUNITY): Payer: PPO

## 2018-07-16 ENCOUNTER — Ambulatory Visit: Payer: Self-pay | Admitting: Neurology

## 2018-08-07 DIAGNOSIS — E860 Dehydration: Secondary | ICD-10-CM

## 2018-08-07 HISTORY — DX: Dehydration: E86.0

## 2018-08-20 ENCOUNTER — Ambulatory Visit (HOSPITAL_COMMUNITY): Payer: PPO

## 2018-08-20 ENCOUNTER — Other Ambulatory Visit (HOSPITAL_COMMUNITY): Payer: PPO

## 2018-08-23 ENCOUNTER — Emergency Department (HOSPITAL_COMMUNITY): Payer: PPO

## 2018-08-23 ENCOUNTER — Emergency Department (HOSPITAL_COMMUNITY)
Admission: EM | Admit: 2018-08-23 | Discharge: 2018-08-24 | Disposition: A | Discharge status: Home / Self Care | Payer: PPO | Attending: Emergency Medicine | Admitting: Emergency Medicine

## 2018-08-23 ENCOUNTER — Other Ambulatory Visit: Payer: Self-pay

## 2018-08-23 DIAGNOSIS — Z79899 Other long term (current) drug therapy: Secondary | ICD-10-CM | POA: Insufficient documentation

## 2018-08-23 DIAGNOSIS — Z7901 Long term (current) use of anticoagulants: Secondary | ICD-10-CM | POA: Insufficient documentation

## 2018-08-23 DIAGNOSIS — Z96643 Presence of artificial hip joint, bilateral: Secondary | ICD-10-CM | POA: Insufficient documentation

## 2018-08-23 DIAGNOSIS — Z1159 Encounter for screening for other viral diseases: Secondary | ICD-10-CM | POA: Diagnosis not present

## 2018-08-23 DIAGNOSIS — N183 Chronic kidney disease, stage 3 (moderate): Secondary | ICD-10-CM | POA: Insufficient documentation

## 2018-08-23 DIAGNOSIS — E860 Dehydration: Secondary | ICD-10-CM | POA: Insufficient documentation

## 2018-08-23 DIAGNOSIS — F17210 Nicotine dependence, cigarettes, uncomplicated: Secondary | ICD-10-CM | POA: Insufficient documentation

## 2018-08-23 DIAGNOSIS — Z9181 History of falling: Secondary | ICD-10-CM | POA: Insufficient documentation

## 2018-08-23 DIAGNOSIS — I129 Hypertensive chronic kidney disease with stage 1 through stage 4 chronic kidney disease, or unspecified chronic kidney disease: Secondary | ICD-10-CM | POA: Diagnosis not present

## 2018-08-23 DIAGNOSIS — Z209 Contact with and (suspected) exposure to unspecified communicable disease: Secondary | ICD-10-CM | POA: Diagnosis not present

## 2018-08-23 DIAGNOSIS — R509 Fever, unspecified: Secondary | ICD-10-CM | POA: Diagnosis not present

## 2018-08-23 DIAGNOSIS — R410 Disorientation, unspecified: Secondary | ICD-10-CM | POA: Diagnosis not present

## 2018-08-23 DIAGNOSIS — R531 Weakness: Secondary | ICD-10-CM | POA: Diagnosis not present

## 2018-08-23 DIAGNOSIS — W19XXXA Unspecified fall, initial encounter: Secondary | ICD-10-CM

## 2018-08-23 DIAGNOSIS — R0902 Hypoxemia: Secondary | ICD-10-CM | POA: Diagnosis not present

## 2018-08-23 DIAGNOSIS — R5383 Other fatigue: Secondary | ICD-10-CM | POA: Diagnosis not present

## 2018-08-23 DIAGNOSIS — Z85528 Personal history of other malignant neoplasm of kidney: Secondary | ICD-10-CM | POA: Insufficient documentation

## 2018-08-23 DIAGNOSIS — Z8673 Personal history of transient ischemic attack (TIA), and cerebral infarction without residual deficits: Secondary | ICD-10-CM | POA: Insufficient documentation

## 2018-08-23 DIAGNOSIS — R000 Tachycardia, unspecified: Secondary | ICD-10-CM | POA: Diagnosis not present

## 2018-08-23 DIAGNOSIS — Z20828 Contact with and (suspected) exposure to other viral communicable diseases: Secondary | ICD-10-CM | POA: Diagnosis not present

## 2018-08-23 DIAGNOSIS — S299XXA Unspecified injury of thorax, initial encounter: Secondary | ICD-10-CM | POA: Diagnosis not present

## 2018-08-23 DIAGNOSIS — M25552 Pain in left hip: Secondary | ICD-10-CM | POA: Diagnosis not present

## 2018-08-23 LAB — CBC WITH DIFFERENTIAL/PLATELET
Abs Immature Granulocytes: 0.04 10*3/uL (ref 0.00–0.07)
Basophils Absolute: 0 10*3/uL (ref 0.0–0.1)
Basophils Relative: 1 %
Eosinophils Absolute: 0.1 10*3/uL (ref 0.0–0.5)
Eosinophils Relative: 1 %
HCT: 40 % (ref 39.0–52.0)
Hemoglobin: 13.2 g/dL (ref 13.0–17.0)
Immature Granulocytes: 1 %
Lymphocytes Relative: 5 %
Lymphs Abs: 0.3 10*3/uL — ABNORMAL LOW (ref 0.7–4.0)
MCH: 32.7 pg (ref 26.0–34.0)
MCHC: 33 g/dL (ref 30.0–36.0)
MCV: 99 fL (ref 80.0–100.0)
Monocytes Absolute: 0.6 10*3/uL (ref 0.1–1.0)
Monocytes Relative: 8 %
Neutro Abs: 6.5 10*3/uL (ref 1.7–7.7)
Neutrophils Relative %: 84 %
Platelets: 111 10*3/uL — ABNORMAL LOW (ref 150–400)
RBC: 4.04 MIL/uL — ABNORMAL LOW (ref 4.22–5.81)
RDW: 12.7 % (ref 11.5–15.5)
WBC: 7.6 10*3/uL (ref 4.0–10.5)
nRBC: 0 % (ref 0.0–0.2)

## 2018-08-23 LAB — COMPREHENSIVE METABOLIC PANEL
ALT: 21 U/L (ref 0–44)
AST: 20 U/L (ref 15–41)
Albumin: 3.8 g/dL (ref 3.5–5.0)
Alkaline Phosphatase: 64 U/L (ref 38–126)
Anion gap: 8 (ref 5–15)
BUN: 28 mg/dL — ABNORMAL HIGH (ref 8–23)
CO2: 22 mmol/L (ref 22–32)
Calcium: 8.5 mg/dL — ABNORMAL LOW (ref 8.9–10.3)
Chloride: 107 mmol/L (ref 98–111)
Creatinine, Ser: 1.68 mg/dL — ABNORMAL HIGH (ref 0.61–1.24)
GFR calc Af Amer: 44 mL/min — ABNORMAL LOW (ref >60)
GFR calc non Af Amer: 38 mL/min — ABNORMAL LOW (ref >60)
Glucose, Bld: 143 mg/dL — ABNORMAL HIGH (ref 70–99)
Potassium: 3.6 mmol/L (ref 3.5–5.1)
Sodium: 137 mmol/L (ref 135–145)
Total Bilirubin: 0.8 mg/dL (ref 0.3–1.2)
Total Protein: 6.1 g/dL — ABNORMAL LOW (ref 6.5–8.1)

## 2018-08-23 LAB — URINALYSIS, ROUTINE W REFLEX MICROSCOPIC
Bilirubin Urine: NEGATIVE
Glucose, UA: NEGATIVE mg/dL
Hgb urine dipstick: NEGATIVE
Ketones, ur: NEGATIVE mg/dL
Leukocytes,Ua: NEGATIVE
Nitrite: NEGATIVE
Protein, ur: NEGATIVE mg/dL
Specific Gravity, Urine: 1.02 (ref 1.005–1.030)
pH: 5 (ref 5.0–8.0)

## 2018-08-23 LAB — SARS CORONAVIRUS 2 BY RT PCR (HOSPITAL ORDER, PERFORMED IN ~~LOC~~ HOSPITAL LAB): SARS Coronavirus 2: NEGATIVE

## 2018-08-23 LAB — LIPASE, BLOOD: Lipase: 35 U/L (ref 11–51)

## 2018-08-23 LAB — ETHANOL: Alcohol, Ethyl (B): <10 mg/dL (ref <10)

## 2018-08-23 NOTE — Discharge Instructions (Signed)
As discussed, your evaluation today has been largely reassuring.  But, it is important that you monitor your condition carefully, and do not hesitate to return to the ED if you develop new, or concerning changes in your condition. ? ?Otherwise, please follow-up with your physician for appropriate ongoing care. ? ?

## 2018-08-23 NOTE — ED Notes (Addendum)
While this NT, Raquel Sarna RN, and Lilli, EMT were at bedside completing in and out cath, pt asked how long the process would take- writer had explained procedure prior to procedure beginning, and reassured pt it would take no longer than five minutes from time of insertion of catheter, to time of taking catheter out. While obtaining the urine specimen, pt said "if you leave and don't take this tube out, I'm gonna go home and get my shotgun and come back here to take care of you". Writer explained to pt that he shouldn't threaten staff.

## 2018-08-23 NOTE — ED Provider Notes (Signed)
[place] DEPT Provider Note   CSN: [id number] Arrival date & time: 08/23/18  1938    History   Chief Complaint Chief Complaint  Patient presents with   Fall   Fever    HPI Jeffrey Hardy is a 83 y.o. male.     HPI Patient presents via EMS after series of falls, with concern of these and fever. Patient is known to have worsening memory loss, which has been noted to be progressing, per family according to EMS. However, it seems over the past day at least, the patient has any fever, has fallen frequently, has some pain in his lower extremities, though he inconsistently describes where the pain is. He has a history of prior stroke, has noted hemiplegia as well. The patient offers a rambling account of why he is here, deflects many questions. Level 5 caveat secondary to memory loss. Past Medical History:  Diagnosis Date   Anxiety    Bilateral renal cysts    Cancer (HCC)    renal mass   Depression    Diverticulosis of colon    Erectile dysfunction    Hematuria    History of cerebral embolic infarction x2 CVA's ---  neurologist-  [name]--- per pt residual abnormal gait , does need cane   12-16-2011  right frontal embolic infarct and [id number] left frontal embolic infarct (per first MRI previous hemorrgaic cerebral ischemia)   History of duodenal ulcer    9390   History of Helicobacter pylori infection    1997   History of hiatal hernia    History of loop recorder    Current   History of syncope    1997  and 2014  s/p  loop recorder   Hyperlipidemia    Hypertension    Nocturia    PAF (paroxysmal atrial fibrillation) [facility])    cardiologist-  dr [name]   Peripheral neuropathy 05/04/2018   Renal cell cancer ([place]) 09/06/2016   Right renal mass    Status post placement of implantable loop recorder    07-06-2012   Stroke [facility])    2013 2 mild strokes mild weakness bil sides   Urgency of urination    Wears hearing  aid    right ear only    Patient Active Problem List   Diagnosis Date Noted   Parkinsonism, secondary ([place]) 06/09/2018   Peripheral neuropathy 05/04/2018   Syncope 02/06/2018   Closed displaced fracture of right femoral neck ([place]) 02/01/2018   PAF (paroxysmal atrial fibrillation) ([place]) 01/30/2018   Closed right hip fracture, initial encounter ([place]) 01/29/2018   CKD (chronic kidney disease) stage 3, GFR 30-59 ml/min (HCC) 09/04/2017   Hip fracture ([place]) 09/04/2017   Renal cell cancer ([place]) 09/06/2016   Urinary frequency 03/08/2016   Memory dysfunction 03/08/2016   History of colonic polyps 07/07/2015   Hyperglycemia 07/04/2015   Cerebrovascular disease, arteriosclerotic, post-stroke 12/28/2013   Shingles 06/29/2013   Incontinent of urine 12/29/2012   Thrombocytopenia ([place]) 01/13/2012   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   Preventative health care 07/27/2010   Hyperlipidemia 05/12/2008   Essential hypertension 05/12/2008   GERD 05/12/2008   DIVERTICULOSIS, COLON 05/12/2008    Past Surgical History:  Procedure Laterality Date   APPENDECTOMY  age 60   COLONOSCOPY  last one 07-17-2015   CYSTOSCOPY/RETROGRADE/URETEROSCOPY Right 04/22/2016   Procedure: CYSTOSCOPY/RETROGRADE/URETEROSCOPY/ BIOPSY/ STENT PLACEMENT;  Surgeon: [name], MD;  Location[facility];  Service: Urology;  Laterality: Right;   ESOPHAGOGASTRODUODENOSCOPY  last one 09-16-2014   LOOP RECORDER IMPLANT N/A 07/06/2012   Procedure: LOOP RECORDER IMPLANT;  Surgeon: [name], MD;  Location[facility] LAB;  Service: Cardiovascular;  Laterality: N/A;   ROBOT ASSITED LAPAROSCOPIC NEPHROURETERECTOMY Right 05/13/2016   Procedure: XI ROBOT ASSITED LAPAROSCOPIC NEPHROURETERECTOMY;  Surgeon: [name], MD;  Location: [facility];  Service: Urology;  Laterality: Right;   TEE WITHOUT CARDIOVERSION  12/19/2011   Procedure: TRANSESOPHAGEAL ECHOCARDIOGRAM (TEE);  Surgeon: [name], MD;  Location: [facility] ENDOSCOPY;  Service: Cardiovascular;  Laterality: N/A;  no evidence thrombus in the atrial cavity or appendage;  mild AV thickened w/ mild AI;  minimal fixed plaque in thoracic aorta;  mild MV thickened w/ mild MR;  NO pfo by color doppler or with injection of agitated saline   TOTAL HIP ARTHROPLASTY Left 09/07/2017   Procedure: TOTAL HIP ARTHROPLASTY ANTERIOR APPROACH;  Surgeon: Rod Can, MD;  Location: [facility];  Service: Orthopedics;  Laterality: Left;   TOTAL HIP ARTHROPLASTY Right 02/01/2018   Procedure: TOTAL HIP ARTHROPLASTY ANTERIOR APPROACH;  Surgeon: Rod Can, MD;  Location: [place];  Service: Orthopedics;  Laterality: Right;   TRANSTHORACIC ECHOCARDIOGRAM  09-10-20103   grade 1 diastolic dysfunction,  ef 55-60%/  AV sclerosis without stenosis/  trivial TR   WRIST FRACTURE SURGERY Right 2014  approx.   retained hardware        Home Medications    Prior to Admission medications   Medication Sig Start Date End Date Taking? Authorizing Provider  B Complex Vitamins (VITAMIN B COMPLEX PO) Take by mouth.   Yes [provider]  Cholecalciferol (VITAMIN D3) 5000 units CAPS Take 1 capsule by mouth daily.   Yes [provider]  ELIQUIS 2.5 MG TABS tablet Take 1 tablet by mouth 2 (two) times daily. 04/09/18  Yes [provider]  ferrous sulfate 325 (65 FE) MG tablet Take 325 mg by mouth daily with breakfast.   Yes [provider]  tamsulosin (FLOMAX) 0.4 MG CAPS capsule Take 1 capsule (0.4 mg total) by mouth daily after breakfast. 09/06/16  Yes Biagio Borg, MD    Family History Family History  Problem Relation Age of Onset   Heart Problems Mother    Brain cancer Father    Parkinsonism Brother    Dementia Brother    Colon cancer Neg Hx    Esophageal cancer Neg Hx    Stomach cancer Neg Hx    Gastric cancer Neg Hx    Liver disease Neg Hx    Kidney disease Neg Hx    Diabetes Neg Hx     Social  History Social History   Tobacco Use   Smoking status: Former Smoker    Years: 5.00    Types: Cigarettes, Pipe    Last attempt to quit: 09/07/1966    Years since quitting: 51.9   Smokeless tobacco: Never Used  Substance Use Topics   Alcohol use: No    Alcohol/week: 0.0 standard drinks   Drug use: No     Allergies   Acyclovir and related   Review of Systems Review of Systems  Unable to perform ROS: Dementia     Physical Exam Updated Vital Signs BP (!) 141/88 (BP Location: Right Arm)    Pulse 74    Temp 99.3 F (37.4 C) (Oral)    Resp 20    Ht 6\' 2"  (1.88 m)    Wt 90.7 kg    SpO2 93%  BMI 25.68 kg/m   Physical Exam Vitals signs and nursing note reviewed.  Constitutional:      General: He is not in acute distress.    Appearance: He is well-developed.  HENT:     Head: Normocephalic and atraumatic.  Eyes:     Conjunctiva/sclera: Conjunctivae normal.  Cardiovascular:     Rate and Rhythm: Normal rate and regular rhythm.  Pulmonary:     Effort: Pulmonary effort is normal. No respiratory distress.     Breath sounds: No stridor.  Abdominal:     General: There is no distension.  Musculoskeletal:     Comments: No gross deformities, the patient points to an area and asks if it hurts right there when I ask him where it hurts.  He does move all extremities spontaneously, minimally, with noted hemiplegia, left-sided.  Skin:    General: Skin is warm and dry.  Neurological:     Mental Status: He is alert.     Cranial Nerves: No cranial nerve deficit.     Motor: Atrophy and abnormal muscle tone present.     Coordination: Coordination abnormal.     Comments: Left-sided hemiplegia  Psychiatric:        Cognition and Memory: Cognition is impaired.      ED Treatments / Results  Labs (all labs ordered are listed, but only abnormal results are displayed) Labs Reviewed  COMPREHENSIVE METABOLIC PANEL - Abnormal; Notable for the following components:      Result Value    Glucose, Bld 143 (*)    BUN 28 (*)    Creatinine, Ser 1.68 (*)    Calcium 8.5 (*)    Total Protein 6.1 (*)    GFR calc non Af Amer 38 (*)    GFR calc Af Amer 44 (*)    All other components within normal limits  CBC WITH DIFFERENTIAL/PLATELET - Abnormal; Notable for the following components:   RBC 4.04 (*)    Platelets 111 (*)    Lymphs Abs 0.3 (*)    All other components within normal limits  SARS CORONAVIRUS 2 (HOSPITAL ORDER, [place] [facility])  LIPASE, BLOOD  URINALYSIS, ROUTINE W REFLEX MICROSCOPIC  ETHANOL    EKG None  Radiology Dg Pelvis Portable  Result Date: 08/23/2018 CLINICAL DATA:  83 year old male with weakness, lethargy, fever. Left side hip pain. Being tested for COVID-19. EXAM: PORTABLE PELVIS 1-2 VIEWS COMPARISON:  Portable pelvis 02/01/2018 and earlier. FINDINGS: Portable AP view at 1959 hours. Visible bilateral hip arthroplasty are stable. Stable bony pelvis. Increased dystrophic calcifications about the right hip hardware. No acute osseous abnormality identified. Negative visible bowel gas pattern. IMPRESSION: No acute radiographic finding. Electronically Signed   By: Genevie Ann M.D.   On: 08/23/2018 20:50   Dg Chest Port 1 View  Result Date: 08/23/2018 CLINICAL DATA:  83 year old male status post falls today. Weakness. Fever. EXAM: PORTABLE CHEST 1 VIEW COMPARISON:  01/29/2018 portable chest and earlier. FINDINGS: Portable AP semi upright view at 1959 hours. Lung volumes and mediastinal contours are within normal limits. Stable cardiac event recorder. Chronic apically scarring, otherwise Allowing for portable technique the lungs are clear. No pneumothorax or pleural effusion. Negative visible bowel gas pattern. Osteopenia. Stable visualized osseous structures. IMPRESSION: No acute cardiopulmonary abnormality or acute traumatic injury identified. Electronically Signed   By: Genevie Ann M.D.   On: 08/23/2018 20:44    Procedures Procedures (including  critical care time)  Medications Ordered in ED Medications - No data  to display   Initial Impression / Assessment and Plan / ED Course  I have reviewed the triage vital signs and the nursing notes.  Pertinent labs & imaging results that were available during my care of the patient were reviewed by me and considered in my medical decision making (see chart for details).    Update: On repeat exam the patient is awake, alert, interactive.  I discussed his presentation with his wife, and with him. With concern for fall we discussed x-rays which were both reassuring, no evidence for new fracture, labs reassuring, no evidence for coronavirus, and similarly, no x-ray for pneumonia on chest x-ray. With low-grade fever, patient may have viral infection, urinalysis pending.    11:49 PM Urinalysis unremarkable. Labs generally reassuring aside from mild evidence for dehydration, consistent with the patient's difficulty urinating. Patient encouraged to drink plenty of fluids, use Tylenol for fever control, and will follow-up with primary care.  This elderly male presents after a series of falls per Patient has ongoing evaluation for neurodegenerative condition, has had frustrating experience with the postponement of multiple visits due to the pandemic I discussed this with him and his wife at length However, with today's reassuring findings, there is no evidence for acute new pathology, and the patient was discharged to his house.  Final Clinical Impressions(s) / ED Diagnoses   Final diagnoses:  Fall, initial encounter  Dehydration     Carmin Muskrat, MD 08/23/18 2351

## 2018-08-23 NOTE — ED Notes (Signed)
Pt currently attempting to give urine sample via urinal.

## 2018-08-23 NOTE — ED Notes (Signed)
Bed: OJ50 Expected date:  Expected time:  Means of arrival:  Comments: 83yo M/ Fever-weakness

## 2018-08-23 NOTE — ED Triage Notes (Signed)
Patient BIB [facility] from home. Pt fell twice today and wife reports increased weakness, lethargy and forgetfulness for months. Pt's apt with neurologist has been canceled several times. Pt has hx of stroke with rt sided weakness.

## 2018-08-23 NOTE — ED Notes (Signed)
This NT and Lilli, EMT explained in and out cath procedure to pt and obtained verbal permission from pt to complete in and out cath for sterile urine specimen.

## 2018-08-24 ENCOUNTER — Encounter: Payer: Self-pay | Admitting: Internal Medicine

## 2018-08-24 ENCOUNTER — Other Ambulatory Visit: Payer: Self-pay

## 2018-08-24 ENCOUNTER — Telehealth: Payer: Self-pay | Admitting: Internal Medicine

## 2018-08-24 ENCOUNTER — Ambulatory Visit (INDEPENDENT_AMBULATORY_CARE_PROVIDER_SITE_OTHER): Payer: PPO | Admitting: Internal Medicine

## 2018-08-24 DIAGNOSIS — Z8673 Personal history of transient ischemic attack (TIA), and cerebral infarction without residual deficits: Secondary | ICD-10-CM

## 2018-08-24 DIAGNOSIS — R739 Hyperglycemia, unspecified: Secondary | ICD-10-CM | POA: Diagnosis not present

## 2018-08-24 DIAGNOSIS — F0390 Unspecified dementia without behavioral disturbance: Secondary | ICD-10-CM

## 2018-08-24 DIAGNOSIS — N183 Chronic kidney disease, stage 3 unspecified: Secondary | ICD-10-CM

## 2018-08-24 DIAGNOSIS — G4700 Insomnia, unspecified: Secondary | ICD-10-CM

## 2018-08-24 DIAGNOSIS — R269 Unspecified abnormalities of gait and mobility: Secondary | ICD-10-CM | POA: Diagnosis not present

## 2018-08-24 DIAGNOSIS — I672 Cerebral atherosclerosis: Secondary | ICD-10-CM

## 2018-08-24 MED ORDER — MIRTAZAPINE 7.5 MG PO TABS: 7.5000 mg | ORAL_TABLET | Freq: Every day | ORAL | 3 refills | Status: DC

## 2018-08-24 NOTE — Telephone Encounter (Signed)
I spoke to pt's wife- he is scheduled with Dr. Jaynee Eagles on 08/27/18. He fell twice on 08/23/18. He c/o leg weakness. She wants to know if Dr. Jenny Reichmann will order some [facility] evaluations. DOXY visit scheduled with PCP today at 7:20 pm. [name] given instructions. Link texted to [phone].

## 2018-08-24 NOTE — Progress Notes (Addendum)
Patient ID: Jeffrey Hardy, male   DOB: [birth year], 83 y.o.   MRN: [id number]  Virtual Visit via Video Note  I connected with [name] on 08/24/18 at  7:20 PM EDT by a video enabled telemedicine application and verified that I am speaking with the correct person using two identifiers.  Location: Patient: at home with wife to assist with hx Provider: at home   I discussed the limitations of evaluation and management by telemedicine and the availability of in person appointments. The patient expressed understanding and agreed to proceed.  History of Present Illness: Here to f/u after recent fall and seen in ED with neg pelvic xray and routine labs essentially unremarkable, UA neg.  Wife is encouraged but states he still has significant generalized weakness and gradually less ability to ambulate well overall since oct 2019 hip surgury.  DIid wsell initially with PT postop for several weeks, then afterwards did not keep up the excercises due to low personal motivation per wife.  They did very much like Cecilie Lowers the therapist but after he finished the patient was no longer motivated.  Denies worsening depressive symptoms, suicidal ideation, or panic; has ongoing anxiety and increased difficulty with sleeping as well.  Pt denies chest pain, increased sob or doe, wheezing, orthopnea, PND, increased LE swelling, palpitations, dizziness or syncope.  Pt denies new neurological symptoms such as new headache, or facial or extremity weakness or numbness, though has ongoing parkinsons with worsening balance.  Pt will accept home PT, and wife states [facility] involvement would be helpful as well  No other new complaints except needs hospital bed:  Due to dementia, stroke, kidney cancer pt requires frequent changes in body position and/or has the an immediate need for changes in body position."  The patient has a medical condition which requires positioning of the body in ways not feasible with an ordinary bed.  The patient  requires positioning of the body not feasible with an ordinary bed in order to alleviate pain.  The patient requires the head of the bed to be elevated more than 30 degrees most of the time due to risk of aspiration and history of stroke. Past Medical History:  Diagnosis Date  . Anxiety   . Bilateral renal cysts   . Cancer ([place])    renal mass  . Depression   . Diverticulosis of colon   . Erectile dysfunction   . Hematuria   . History of cerebral embolic infarction x2 CVA's ---  neurologist-  [name]--- per pt residual abnormal gait , does need cane   12-16-2011  right frontal embolic infarct and [id number] left frontal embolic infarct (per first MRI previous hemorrgaic cerebral ischemia)  . History of duodenal ulcer    1997  . History of Helicobacter pylori infection    1997  . History of hiatal hernia   . History of loop recorder    Current  . History of syncope    1997  and 2014  s/p  loop recorder  . Hyperlipidemia   . Hypertension   . Nocturia   . PAF (paroxysmal atrial fibrillation) [facility])    cardiologist-  dr [name]  . Peripheral neuropathy 05/04/2018  . Renal cell cancer ([place]) 09/06/2016  . Right renal mass   . Status post placement of implantable loop recorder    07-06-2012  . Stroke [facility])    2013 2 mild strokes mild weakness bil sides  . Urgency of urination   . Wears hearing aid  right ear only   Past Surgical History:  Procedure Laterality Date  . APPENDECTOMY  age 52  . COLONOSCOPY  last one 07-17-2015  . CYSTOSCOPY/RETROGRADE/URETEROSCOPY Right 04/22/2016   Procedure: CYSTOSCOPY/RETROGRADE/URETEROSCOPY/ BIOPSY/ STENT PLACEMENT;  Surgeon: [name], MD;  Location: [facility];  Service: Urology;  Laterality: Right;  . ESOPHAGOGASTRODUODENOSCOPY  last one 09-16-2014  . LOOP RECORDER IMPLANT N/A 07/06/2012   Procedure: LOOP RECORDER IMPLANT;  Surgeon: [name], MD;  Location: [facility] CATH LAB;  Service: Cardiovascular;  Laterality: N/A;  .  ROBOT ASSITED LAPAROSCOPIC NEPHROURETERECTOMY Right 05/13/2016   Procedure: XI ROBOT ASSITED LAPAROSCOPIC NEPHROURETERECTOMY;  Surgeon: [name], MD;  Location[facility];  Service: Urology;  Laterality: Right;  . TEE WITHOUT CARDIOVERSION  12/19/2011   Procedure: TRANSESOPHAGEAL ECHOCARDIOGRAM (TEE);  Surgeon: [name], MD;  Location[facility] ENDOSCOPY;  Service: Cardiovascular;  Laterality: N/A;  no evidence thrombus in the atrial cavity or appendage;  mild AV thickened w/ mild AI;  minimal fixed plaque in thoracic aorta;  mild MV thickened w/ mild MR;  NO pfo by color doppler or with injection of agitated saline  . TOTAL HIP ARTHROPLASTY Left 09/07/2017   Procedure: TOTAL HIP ARTHROPLASTY ANTERIOR APPROACH;  Surgeon: Rod Can, MD;  Location: [facility];  Service: Orthopedics;  Laterality: Left;  . TOTAL HIP ARTHROPLASTY Right 02/01/2018   Procedure: TOTAL HIP ARTHROPLASTY ANTERIOR APPROACH;  Surgeon: Rod Can, MD;  Location: [place];  Service: Orthopedics;  Laterality: Right;  . TRANSTHORACIC ECHOCARDIOGRAM  09-10-20103   grade 1 diastolic dysfunction,  ef 55-60%/  AV sclerosis without stenosis/  trivial TR  . WRIST FRACTURE SURGERY Right 2014  approx.   retained hardware    reports that he quit smoking about 51 years ago. His smoking use included cigarettes and pipe. He quit after 5.00 years of use. He has never used smokeless tobacco. He reports that he does not drink alcohol or use drugs. family history includes Brain cancer in his father; Dementia in his brother; Heart Problems in his mother; Parkinsonism in his brother. Allergies  Allergen Reactions  . Acyclovir And Related Other (See Comments)    Unsure of what happens   Current Outpatient Medications on File Prior to Visit  Medication Sig Dispense Refill  . B Complex Vitamins (VITAMIN B COMPLEX PO) Take by mouth.    . Cholecalciferol (VITAMIN D3) 5000 units CAPS Take 5,000 Units by mouth daily.     [name] ELIQUIS 2.5 MG TABS  tablet Take 2.5 mg by mouth 2 (two) times daily.     . ferrous sulfate 325 (65 FE) MG tablet Take 325 mg by mouth daily with breakfast.    . tamsulosin (FLOMAX) 0.4 MG CAPS capsule Take 1 capsule (0.4 mg total) by mouth daily after breakfast. 90 capsule 3   No current facility-administered medications on file prior to visit.     Observations/Objective: Alert, NAD, appropriate mood and affect, resps normal, cn 2-12 intact, moves all 4s, no visible rash or swelling Lab Results  Component Value Date   WBC 7.6 08/23/2018   HGB 13.2 08/23/2018   HCT 40.0 08/23/2018   PLT 111 (L) 08/23/2018   GLUCOSE 143 (H) 08/23/2018   CHOL 142 06/02/2018   TRIG 97.0 06/02/2018   HDL 35.80 (L) 06/02/2018   LDLCALC 87 06/02/2018   ALT 21 08/23/2018   AST 20 08/23/2018   NA 137 08/23/2018   K 3.6 08/23/2018   CL 107 08/23/2018   CREATININE  1.68 (H) 08/23/2018   BUN 28 (H) 08/23/2018   CO2 22 08/23/2018   TSH 2.30 06/02/2018   PSA 0.90 11/03/2017   INR 1.19 01/29/2018   HGBA1C 5.6 06/02/2018   Assessment and Plan: See notes  Follow Up Instructions: See notes   I discussed the assessment and treatment plan with the patient. The patient was provided an opportunity to ask questions and all were answered. The patient agreed with the plan and demonstrated an understanding of the instructions.   The patient was advised to call back or seek an in-person evaluation if the symptoms worsen or if the condition fails to improve as anticipated.  Cathlean Cower, MD

## 2018-08-24 NOTE — Telephone Encounter (Signed)
Can the visit be done by doxy?  thanks

## 2018-08-24 NOTE — Assessment & Plan Note (Signed)
Ok for remeron 7.5 qhs

## 2018-08-24 NOTE — ED Notes (Signed)
Pt dressed and ready for wife to pick pt up. Pt stood at bedside with Probation officer and [place], EMT and utilized a cane with the railing, as he does with the walker at home.

## 2018-08-24 NOTE — Assessment & Plan Note (Signed)
stable overall by history and exam, recent data reviewed with pt, and pt to continue medical treatment as before,  to f/u any worsening symptoms or concerns  

## 2018-08-24 NOTE — Assessment & Plan Note (Addendum)
With recent falls, high risk for further, will need HH with RN and PT  Note:  Total time for pt hx, exam, review of record with pt in the room, determination of diagnoses and plan for further eval and tx is > 40 min, with over 50% spent in coordination and counseling of patient including the differential dx, tx, further evaluation and other management of gait disorder, dementia, cva, hyperglycemia, CKD, insomnia

## 2018-08-24 NOTE — Telephone Encounter (Signed)
Copied from [place] [id number]. Topic: General - Inquiry >> Aug 24, 2018 10:18 AM Jeri Cos wrote: Reason for CRM: [name] called due to concern over husband being able to make his appt on Thursday. Husband has fallen twice and  had to have EMS/Fire Services help get him up. Pt was seen in the [facility] ED recently. Before being discharged from [facility], the nurse stated pt could walk but maybe the pt is stopping himself mentally. [name] wants a call to discuss if pt needs some kind of pain medication to help get him to the appt on Thursday or what help can Dr. Jenny Reichmann provide.

## 2018-08-24 NOTE — Assessment & Plan Note (Signed)
Stable,  to f/u any worsening symptoms or concerns 

## 2018-08-24 NOTE — Assessment & Plan Note (Addendum)
Without behavioral disturbance, stable, cont currrent tx, also for [facility] referral

## 2018-08-24 NOTE — Patient Instructions (Signed)
Please take all new medication as prescribed - the remeron  Please continue all other medications as before, and refills have been done if requested.  Please have the pharmacy call with any other refills you may need.  Please continue your efforts at being more active, low cholesterol diet, and weight control.  You are otherwise up to date with prevention measures today.  Please keep your appointments with your specialists as you may have planned  You will be contacted regarding the referral for: Home Health with RN and PT  You will be contacted regarding the referral for: [facility]

## 2018-08-25 ENCOUNTER — Other Ambulatory Visit: Payer: Self-pay

## 2018-08-25 NOTE — Patient Outreach (Signed)
[facility]) Care Management  08/25/2018  Jeffrey Hardy [birth year] [id number]   Referral Date: 08/24/2018 Referral Source: Nurseline and MD referral Referral Reason: Falls/ decline   Outreach Attempt: spoke with wife Jeffrey Hardy [facility]).  She is able to verify HIPAA.  Discussed reason for referral.  She feels like patient has hit a slump as he has had 2 falls in the same day.  She reports that the neurologist suspects parkinson's disease and are set to see him on Thursday.  She reports that patient care is getting challenging due to his immobility but she manages.  The main goal of care at this time is to improve mobility to remain at home as long as possible.  Home Health orders have been completed and wife waiting for home health to contact.  Discussed fall prevention and use of assistive devices.  She verbalized understanding.    Social: Patient lives in the home with wife who is his primary caregiver.  Patient is dependent upon wife for all aspects of care.     Conditions: Patient with recent falls, history of parkinson, CVA, Renal mass, right and left hip replacements, CKD, HTN, and hyperlipidemia.  Patient has recently had problems with his gait causing more falls and mobility issues.     Medications: Medications reviewed and wife manages patient medications.     Appointments:  Patient to see Neurologist on Thursday.     Advanced Directives: Patient has completed advanced directives.     Consent: Discussed [facility] services with wife.  She is agreeable to RN case management.   Plan: RN CM will provide ongoing education for caregiver/patient on falls through phone calls and sending printed information. RN CM will send welcome packet with consent to caregiver/patient as well as printed information on falls. RN CM will send initial barriers letter, assessment, and care plan to primary care physician. RN CM will contact caregiver/patient within one week and  caregiver/patient agrees to next contact.      Jone Baseman, RN, MSN [facility] Care Management Care Management Coordinator Direct Line [phone] Toll Free: [phone]  Fax: [phone]

## 2018-08-25 NOTE — Progress Notes (Signed)
This encounter was created in error - please disregard.

## 2018-08-26 ENCOUNTER — Telehealth: Payer: Self-pay | Admitting: *Deleted

## 2018-08-26 ENCOUNTER — Encounter: Payer: Self-pay | Admitting: *Deleted

## 2018-08-26 NOTE — Telephone Encounter (Signed)
Spoke with pt's wife [name] (on [place]) and discussed that d/t COVID19 our office is recommending that pt switch to telemedicine. [name] verbalized understanding and would like to do a virtual visit. She understands the limitations with a virtual visit and consented to file the visit with insurance. She asked for the link to be texted to her iphone. She confirmed pt's name & DOB as well and provided updates to medications and history. Pt was just in the hospital last weekend with a fall and dehydration. They couldn't get him up and had to call EMS. For the virtual visit, she understands she will receive a call around 2:00 pm tomorrow to "check-in" and then she should enter the virtual waiting room by clicking the link and entering pt's first & last name. She verbalized appreciation.   Dr. Cathren Laine doxy link texted to [phone] from email.

## 2018-08-27 ENCOUNTER — Other Ambulatory Visit: Payer: Self-pay

## 2018-08-27 ENCOUNTER — Ambulatory Visit (INDEPENDENT_AMBULATORY_CARE_PROVIDER_SITE_OTHER): Payer: PPO | Admitting: Neurology

## 2018-08-27 ENCOUNTER — Telehealth: Payer: Self-pay | Admitting: Neurology

## 2018-08-27 DIAGNOSIS — G20 Parkinson's disease: Secondary | ICD-10-CM

## 2018-08-27 DIAGNOSIS — F0390 Unspecified dementia without behavioral disturbance: Secondary | ICD-10-CM | POA: Diagnosis not present

## 2018-08-27 DIAGNOSIS — R296 Repeated falls: Secondary | ICD-10-CM

## 2018-08-27 MED ORDER — CARBIDOPA-LEVODOPA 25-100 MG PO TABS: ORAL_TABLET | ORAL | 6 refills | Status: DC

## 2018-08-27 NOTE — Patient Outreach (Signed)
[facility]) Care Management  08/27/2018  LORANCE PICKERAL [birth year] [id number]   Return to wife [name].  She reports that patient had some problems getting up this morning but states that they managed.  She was wondering if this could be parkinson's.  Advised wife on parkinson's and that his inability to move could be part of that.  She states she meets with the neurologist at 2:00 pm and will discuss further.  She states that she has caregivers if she needs them and that home health has made contact and will come out on tomorrow for evaluation.  She states with his issues he had going on today his B/P was up some at 152/90.  Advised her to just recheck later today when he has had time to calm down.  She verbalized understanding and declined any further needs at this time.  Plan: RN CM will outreach next week and spouse agreeable.    Jone Baseman, RN, MSN [facility] Care Management Coordinator Direct Line [phone] Cell [phone] Toll Free: [phone]  Fax: [phone]

## 2018-08-27 NOTE — Telephone Encounter (Signed)
Called and left message for his wife to call me. I spoke to [place] from [place] [facility] because he had to re authorized . [place] Sous[place] could not reach them. When Patient's wife calls back I will go over everything with her DAT and Follow up with Dr. Jaynee Eagles.  I will get DAT scan scheduled before Follow. Thanks Hinton Dyer

## 2018-08-27 NOTE — Telephone Encounter (Signed)
Noted thanks °

## 2018-08-27 NOTE — Progress Notes (Signed)
[facility]    Provider:  Dr Jaynee Eagles Primary Care Provider:  Biagio Borg, MD  CC:  Dementia, abnormal gait  Interval history 08/28/2018:  Virtual Visit via Telephone Note  I connected with Jeffrey Hardy on 08/28/18 at  2:30 PM EDT by telephone and verified that I am speaking with the correct person using two identifiers.  Location: Patient: Home Provider: Office   I discussed the limitations, risks, security and privacy concerns of performing an evaluation and management service by telephone and the availability of in person appointments. I also discussed with the patient that there may be a patient responsible charge related to this service. The patient expressed understanding and agreed to proceed.  Follow Up Instructions:    I discussed the assessment and treatment plan with the patient. The patient was provided an opportunity to ask questions and all were answered. The patient agreed with the plan and demonstrated an understanding of the instructions.   The patient was advised to call back or seek an in-person evaluation if the symptoms worsen or if the condition fails to improve as anticipated.  I provided 30 minutes of non-face-to-face time during this encounter.   Melvenia Beam, MD   Patient has had 2 falls.  He walked into the bedroom, started to back up, lost balance and went down this past Sunday. Again later that day he felt weak and he crumbled onto his knees. Prior they were doing relatively well. Slower, imbalance. He sometimes can't move his legs. A lot of stiffness.  There is supposed to have a dopamine transporter scan but it was canceled due to COVID, I coordinated with our office referral staff to get them rescheduled.  This will be an important exam to evaluate whether his parkinsonism is vascular versus parkinsonian such as Parkinson's disease or Lewy body dementia.  We discussed this with the wife.  In the meantime we will add Sinemet to  see if this will help with his stiffness and freezing.  His wife appears to be quite intelligent and very knowledgeable I feel like she understands his diagnosis and prognosis including dementia.  Discussed fall precautions.  They have had physical therapy and Occupational Therapy.  HPI:  Jeffrey Hardy is a 83 y.o. male here as requested by provider for foot drop.  Patient was initially seen by 1 of my colleagues in the office and also had an EMG nerve conduction study done here.  He has a past medical history of peripheral neuropathy, atrial fibrillation, chronic kidney disease, syncope, hip fracture, memory impairment, herpes zoster, cerebral atherosclerosis, hyperlipidemia, essential hypertension.  Patient with a right foot drop since the beginning of October, at the end of October he fell as a result of the foot drop and fractured his hip.  [facility] and no significant spinal pathology was seen to account for his right foot drop.  MRI of the lumbar spine was also completed and part of this evaluation.  Patient has no significant back pain and no high-grade significant neural compression on his MRI that would cause the foot drop. Here with wife who provides most information. Foot dropped started early October/late September. Wife noticed slapping of the foot. She noticed it first time in the morning. He slept in his bed and slept on his back. He has had 2 hip surgeries. He was not allowed to sleep on your side, he may stay in the same position all night. Hip surgery was months  prior. The next hip surgery was after the foot drop. They went to physical therapy. Did not improve. Right leg has the foot drop. He has B12 deficiency and just started to take b12. No numbness and tingling in the feet. MRI of the lumbar spine was negative. Walking difficulty started in September. Having difficulty getting up. Doesn't move like he did. No tremor. His posture is worsening . Everything started  after the first hip replacement. Memory loss ongoing since stroke, progressive memory loss and slowing.   Reviewed notes, labs and imaging from outside physicians, which showed:  Reviewed notes from Dr. Melina Schools.  This is an 83 year old male.  Distally he has a partial foot drop unchanged from prior.  He has palpable pulses.  No significant pedal edema.  No calf tenderness to palpitation.  EMG nerve conduction study revealed mixed peripheral neuropathy.  He is status post right total hip arthroplasty for displaced femoral neck fracture doing well.  On physical exam he states no numbness tingling or difficulty with balance.  Patient has an obvious foot drop with 3 out of 5 EHL tibialis anterior gastrocnemius strength on the right side.  He also has 4-5 hip flexor and quad strength in the right side.  Left is 5 out of 5.  He does complain of some numbness in the L5 and S1 dermatome on the right side but no significant radicular pain in the leg.  Negative Babinski, no clonus, 1+ deep tendon reflexes at the knees and absent at the Achilles.  MRI of the lumbar spine October 2019 showed moderate to significant disc osteophyte complex asymmetric to the right but no spinal canal stenosis.  Mild foraminal narrowing.  Mild degenerative changes at L4-L5 but no significant stenosis.  Similar findings at L2-L3 and L3-L4.  There is also findings of lesions in the right and left lobes of the liver as well as cysts in the kidney.  Foot drop apparently occurred in the early part of October, and he is fallen since then as a result of the foot drop and fractured his hip.  There is no significant spinal pathology that would account for the right foot drop.  Reviewed nerve conduction study, images and data and agree with the following: The distal motor latencies for the peroneal nerves were prolonged on the right and normal on the left with a low motor amplitude on the right normal on the left.  The distal motor latencies for  the posterior tibial nerves were prolonged on the right, normal on the left with a low motor amplitude on the right and normal on the left.  Slowing was seen for the peroneal nerves bilaterally much more significant on the right peroneal nerve.  There was slowing for the right posterior tib nerve, borderline normal on the left and unobtainable for the peroneal nerves bilaterally.  The F-wave latencies per the posterior tibial nerves were prolonged bilaterally. After review of the data I would also add that there is a drop across the fibular head greater than 10 m/s on the right accounting for the asymmetries and localizing at the fibular head.  MRI of the brain in 2014 showed extensive white matter changes, advanced microvascular chronic ischemic changes and global atrophy.  At that time he also had an acute small infarct in the posterior right frontal lobe involving the medial aspect of the right motor strip.  Also other remote infarcts bilaterally.  Review of Systems: Patient complains of symptoms per HPI as well as the  following symptoms: falls. Pertinent negatives and positives per HPI. All others negative.   Social History   Socioeconomic History   Marital status: Married    Spouse name: [name]    Number of children: 2   Years of education: Not on file   Highest education level: Some college, no degree  Occupational History   Occupation: retired  Scientist, product/process development strain: Not on file   Food insecurity:    Worry: Not on file    Inability: Not on file   Transportation needs:    Medical: Not on file    Non-medical: Not on file  Tobacco Use   Smoking status: Former Smoker    Years: 5.00    Types: Cigarettes, Pipe    Last attempt to quit: 09/07/1966    Years since quitting: 52.0   Smokeless tobacco: Never Used  Substance and Sexual Activity   Alcohol use: No    Alcohol/week: 0.0 standard drinks   Drug use: No   Sexual activity: Yes  Lifestyle    Physical activity:    Days per week: Not on file    Minutes per session: Not on file   Stress: Not on file  Relationships   Social connections:    Talks on phone: Not on file    Gets together: Not on file    Attends religious service: Not on file    Active member of club or organization: Not on file    Attends meetings of clubs or organizations: Not on file    Relationship status: Not on file   Intimate partner violence:    Fear of current or ex partner: Not on file    Emotionally abused: Not on file    Physically abused: Not on file    Forced sexual activity: Not on file  Other Topics Concern   Not on file  Social History Narrative   Pt's son is an Dance movement psychotherapist. Patient is retired from [employer]   Left handed    2 cups of caffeine per day    Lives at home with wife     Family History  Problem Relation Age of Onset   Heart Problems Mother    Brain cancer Father    Parkinsonism Brother    Dementia Brother    Colon cancer Neg Hx    Esophageal cancer Neg Hx    Stomach cancer Neg Hx    Gastric cancer Neg Hx    Liver disease Neg Hx    Kidney disease Neg Hx    Diabetes Neg Hx     Past Medical History:  Diagnosis Date   Anxiety    Bilateral renal cysts    Cancer ([place])    renal mass   Dehydration 08/2018   Depression    Diverticulosis of colon    Erectile dysfunction    Hematuria    History of cerebral embolic infarction x2 CVA's ---  neurologist-  [name]--- per pt residual abnormal gait , does need cane   12-16-2011  right frontal embolic infarct and [id number] left frontal embolic infarct (per first MRI previous hemorrgaic cerebral ischemia)   History of duodenal ulcer    5885   History of Helicobacter pylori infection    1997   History of hiatal hernia    History of loop recorder    Current   History of syncope    1997  and 2014  s/p  loop recorder   Hyperlipidemia  Hypertension    Nocturia    PAF (paroxysmal atrial  fibrillation) [facility])    cardiologist-  dr [name]   Peripheral neuropathy 05/04/2018   Renal cell cancer ([place]) 09/06/2016   Right renal mass    Status post placement of implantable loop recorder    07-06-2012   Stroke [facility])    2013 2 mild strokes mild weakness bil sides   Urgency of urination    Wears hearing aid    right ear only    Patient Active Problem List   Diagnosis Date Noted   Falls frequently 08/28/2018   Gait disorder 08/24/2018   Insomnia 08/24/2018   Parkinsonism ([place]) 06/09/2018   Peripheral neuropathy 05/04/2018   Syncope 02/06/2018   Closed displaced fracture of right femoral neck ([place]) 02/01/2018   PAF (paroxysmal atrial fibrillation) ([place]) 01/30/2018   Closed right hip fracture, initial encounter ([place]) 01/29/2018   CKD (chronic kidney disease) stage 3, GFR 30-59 ml/min ([place]) 09/04/2017   Hip fracture ([place]) 09/04/2017   Renal cell cancer ([place]) 09/06/2016   Urinary frequency 03/08/2016   Dementia without behavioral disturbance ([place]) 03/08/2016   History of colonic polyps 07/07/2015   Hyperglycemia 07/04/2015   Cerebrovascular disease, arteriosclerotic, post-stroke 12/28/2013   Shingles 06/29/2013   Incontinent of urine 12/29/2012   Thrombocytopenia ([place]) 01/13/2012   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   Preventative health care 07/27/2010   Hyperlipidemia 05/12/2008   Essential hypertension 05/12/2008   GERD 05/12/2008   DIVERTICULOSIS, COLON 05/12/2008    Past Surgical History:  Procedure Laterality Date   APPENDECTOMY  age 7   COLONOSCOPY  last one 07-17-2015   CYSTOSCOPY/RETROGRADE/URETEROSCOPY Right 04/22/2016   Procedure: CYSTOSCOPY/RETROGRADE/URETEROSCOPY/ BIOPSY/ STENT PLACEMENT;  Surgeon: [name], MD;  Location: [facility];  Service: Urology;  Laterality: Right;   ESOPHAGOGASTRODUODENOSCOPY  last one 09-16-2014   LOOP RECORDER IMPLANT N/A 07/06/2012   Procedure: LOOP RECORDER IMPLANT;  Surgeon:  [name], MD;  Location: [facility] CATH LAB;  Service: Cardiovascular;  Laterality: N/A;   ROBOT ASSITED LAPAROSCOPIC NEPHROURETERECTOMY Right 05/13/2016   Procedure: XI ROBOT ASSITED LAPAROSCOPIC NEPHROURETERECTOMY;  Surgeon: [name], MD;  Location: [facility];  Service: Urology;  Laterality: Right;   TEE WITHOUT CARDIOVERSION  12/19/2011   Procedure: TRANSESOPHAGEAL ECHOCARDIOGRAM (TEE);  Surgeon: [name], MD;  Location[facility];  Service: Cardiovascular;  Laterality: N/A;  no evidence thrombus in the atrial cavity or appendage;  mild AV thickened w/ mild AI;  minimal fixed plaque in thoracic aorta;  mild MV thickened w/ mild MR;  NO pfo by color doppler or with injection of agitated saline   TOTAL HIP ARTHROPLASTY Left 09/07/2017   Procedure: TOTAL HIP ARTHROPLASTY ANTERIOR APPROACH;  Surgeon: Rod Can, MD;  Location: [facility];  Service: Orthopedics;  Laterality: Left;   TOTAL HIP ARTHROPLASTY Right 02/01/2018   Procedure: TOTAL HIP ARTHROPLASTY ANTERIOR APPROACH;  Surgeon: Rod Can, MD;  Location: [place];  Service: Orthopedics;  Laterality: Right;   TRANSTHORACIC ECHOCARDIOGRAM  09-10-20103   grade 1 diastolic dysfunction,  ef 55-60%/  AV sclerosis without stenosis/  trivial TR   WRIST FRACTURE SURGERY Right 2014  approx.   retained hardware    Current Outpatient Medications  Medication Sig Dispense Refill   B Complex Vitamins (VITAMIN B COMPLEX PO) Take 1 tablet by mouth daily. "high powered"     carbidopa-levodopa (SINEMET IR) 25-100 MG tablet Take 1/2 or 1 tab 3 times a day. Take 30 minutes before meals 4 hours  apart. For example, 8am, noon and 4pm. 90 tablet 6   Cholecalciferol (VITAMIN D3) 5000 units CAPS Take 5,000 Units by mouth daily.      clonazePAM (KLONOPIN) 0.5 MG tablet Take 1-2 tabs (0.5mg  to 1mg ) at bedtime 60 tablet 3   ELIQUIS 2.5 MG TABS tablet Take 2.5 mg by mouth 2 (two) times daily.      ferrous sulfate 325 (65 FE) MG tablet Take 325 mg  by mouth daily with breakfast.     mirtazapine (REMERON) 7.5 MG tablet Take 1 tablet (7.5 mg total) by mouth at bedtime. 90 tablet 3   tamsulosin (FLOMAX) 0.4 MG CAPS capsule Take 1 capsule (0.4 mg total) by mouth daily after breakfast. 90 capsule 3   No current facility-administered medications for this visit.     Allergies as of 08/27/2018 - Review Complete 08/26/2018  Allergen Reaction Noted   Acyclovir and related Other (See Comments) 05/09/2016    Vitals: There were no vitals taken for this visit. Last Weight:  Wt Readings from Last 1 Encounters:  08/23/18 200 lb (90.7 kg)   Last Height:   Ht Readings from Last 1 Encounters:  08/23/18 6\' 2"  (1.88 m)   Prior examination:  Physical exam: Exam: Gen: NAD, conversant, pleasant                    CV: RRR, no MRG. No Carotid Bruits. No peripheral edema, warm, nontender Eyes: Conjunctivae clear without exudates or hemorrhage  Neuro: Detailed Neurologic Exam  Speech:    Speech is normal; fluent and spontaneous with impaired comprehension.  Cognition:    The patient is oriented to person     recent and remote memory impaired;     language fluent;     Impaired attention, concentration, fund of knowledge Cranial Nerves: hypomimia/masked facies     The pupils are equal, round, and reactive to light.  Attempted funduscopic exam could not visualize due to small pupils. Visual fields are full to finger confrontation. Extraocular movements are intact. Trigeminal sensation is intact and the muscles of mastication are normal. The face is symmetric. The palate elevates in the midline. Hearing intact. Voice is normal. Shoulder shrug is normal. The tongue has normal motion without fasciculations.   Coordination:    No dysmetria.   Gait:    Decreased arm swing right > left, stooped posture, narrow gait and low clearance slightly shuffling.  Motor Observation:    no involuntary movements noted, no tremor Tone:    Normal muscle  tone.      Strength: right DF 3/5. Good inversion of foot. Slight weakness eversion of foot. 4/5 right dorsiflexion.  Otherwise strength is V/V in the upper and lower limbs.      Sensation: intact to LT     Reflex Exam:  DTR's:   Absent AJs.  Otherwise deep tendon reflexes in the upper and lower extremities are normal bilaterally.   Toes:    The toes are equiv bilaterally.   Clonus:    Clonus is absent.    Assessment/Plan: 83 y.o. male here as initially requested by Dr. Rolena Infante for foot drop.  Foot drop has resolved however gait abnormalities are parkinsonian and patient also appears to have dementia.  Patient was initially seen by 1 of my colleagues in the office and also had an EMG nerve conduction study done here.  He has a past medical history of peripheral neuropathy, atrial fibrillation, chronic kidney disease, syncope, hip fracture, memory impairment, herpes  zoster, cerebral atherosclerosis, hyperlipidemia, essential hypertension.  Patient with a right foot drop since the beginning of October, resolved. Gait likely due to vascular dementia and parkinsonism.  However cannot rule out Parkinson's disease with Parkinson's disease dementia or Lewy body dementia, pending DAT scan.  F/u mid to late June extra time daughter, daughter is coming into town who is a Marine scientist and they would like additional time to discuss his condition, we will schedule an appointment at the end of the day mid-to-late June so we can discuss everything and review.  -  MRI of the brain 2013 showed extensive white matter changes consistent with advanced chronic ischemic microvascular changes as well as global atrophy.  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  No acute changes, but progression of already known  disease.  -Again I suspect he may have a vascular parkinsonism and likely vascular dementia.  However cannot rule out Parkinson's disease and Parkinson's disease dementia or Lewy body dementia.  Pending DaTscan to see if we can differentiate vascular versus parkinsonian etiologies.  -We will start Sinemet see if we can help with patient's residual today and freezing.  - Neuropsych testing for Vascular dementia vs Lewy Body. Scheduled for 6/9.  -Clonazepam as needed at bedtime for REM sleep disorder.   Meds ordered this encounter  Medications   carbidopa-levodopa (SINEMET IR) 25-100 MG tablet    Sig: Take 1/2 or 1 tab 3 times a day. Take 30 minutes before meals 4 hours apart. For example, 8am, noon and 4pm.    Dispense:  90 tablet    Refill:  6   clonazePAM (KLONOPIN) 0.5 MG tablet    Sig: Take 1-2 tabs (0.5mg  to 1mg ) at bedtime    Dispense:  60 tablet    Refill:  3    Cc: Biagio Borg, MD  Sarina Ill, MD  [facility] [address] [place], [place] [address]  Phone [phone] Fax [phone]

## 2018-08-28 DIAGNOSIS — R296 Repeated falls: Secondary | ICD-10-CM | POA: Insufficient documentation

## 2018-08-28 DIAGNOSIS — M1712 Unilateral primary osteoarthritis, left knee: Secondary | ICD-10-CM | POA: Diagnosis not present

## 2018-08-28 DIAGNOSIS — I129 Hypertensive chronic kidney disease with stage 1 through stage 4 chronic kidney disease, or unspecified chronic kidney disease: Secondary | ICD-10-CM | POA: Diagnosis not present

## 2018-08-28 DIAGNOSIS — Z8673 Personal history of transient ischemic attack (TIA), and cerebral infarction without residual deficits: Secondary | ICD-10-CM | POA: Diagnosis not present

## 2018-08-28 DIAGNOSIS — N183 Chronic kidney disease, stage 3 (moderate): Secondary | ICD-10-CM | POA: Diagnosis not present

## 2018-08-28 DIAGNOSIS — G629 Polyneuropathy, unspecified: Secondary | ICD-10-CM | POA: Diagnosis not present

## 2018-08-28 DIAGNOSIS — Z7901 Long term (current) use of anticoagulants: Secondary | ICD-10-CM | POA: Diagnosis not present

## 2018-08-28 DIAGNOSIS — F0280 Dementia in other diseases classified elsewhere without behavioral disturbance: Secondary | ICD-10-CM | POA: Diagnosis not present

## 2018-08-28 DIAGNOSIS — K5730 Diverticulosis of large intestine without perforation or abscess without bleeding: Secondary | ICD-10-CM | POA: Diagnosis not present

## 2018-08-28 DIAGNOSIS — I480 Paroxysmal atrial fibrillation: Secondary | ICD-10-CM | POA: Diagnosis not present

## 2018-08-28 DIAGNOSIS — G20 Parkinson's disease: Secondary | ICD-10-CM | POA: Diagnosis not present

## 2018-08-28 MED ORDER — CLONAZEPAM 0.5 MG PO TABS: ORAL_TABLET | ORAL | 3 refills | Status: DC

## 2018-09-01 ENCOUNTER — Ambulatory Visit: Payer: PPO

## 2018-09-01 ENCOUNTER — Telehealth: Payer: Self-pay | Admitting: Internal Medicine

## 2018-09-01 NOTE — Telephone Encounter (Signed)
Called pt, LVM.   

## 2018-09-01 NOTE — Telephone Encounter (Signed)
Copied from [place] [phone]. Topic: Quick Communication - Home Health Verbal Orders >> Sep 01, 2018  8:34 AM Carolyn Stare wrote: Caller/Agency: Crystal with [facility]   Phone number [phone]  Requesting verbal Skilled Nursing  Frequency 2 x 4       1 x 4

## 2018-09-02 ENCOUNTER — Telehealth: Payer: Self-pay | Admitting: Internal Medicine

## 2018-09-02 ENCOUNTER — Ambulatory Visit: Payer: PPO

## 2018-09-02 DIAGNOSIS — C649 Malignant neoplasm of unspecified kidney, except renal pelvis: Secondary | ICD-10-CM

## 2018-09-02 DIAGNOSIS — G20A1 Parkinson's disease without dyskinesia, without mention of fluctuations: Secondary | ICD-10-CM

## 2018-09-02 DIAGNOSIS — I639 Cerebral infarction, unspecified: Secondary | ICD-10-CM

## 2018-09-02 DIAGNOSIS — F0390 Unspecified dementia without behavioral disturbance: Secondary | ICD-10-CM

## 2018-09-02 DIAGNOSIS — G20 Parkinson's disease: Secondary | ICD-10-CM

## 2018-09-02 NOTE — Telephone Encounter (Signed)
Copied from [place] [id number]. Topic: General - Inquiry >> Sep 02, 2018  9:16 AM Mathis Bud wrote: Reason for CRM: Martin Majestic from [facility] called requesting PCP to write a prescription for hospital bed from [facility].   Kristal call back # [phone]

## 2018-09-03 ENCOUNTER — Ambulatory Visit: Payer: Self-pay

## 2018-09-03 ENCOUNTER — Other Ambulatory Visit: Payer: Self-pay

## 2018-09-03 NOTE — Telephone Encounter (Signed)
Done hardcopy to shirron 

## 2018-09-03 NOTE — Patient Outreach (Signed)
[place] [facility]) Care Management  09/03/2018  Jeffrey Hardy 1936-04-08 [id number]   Telephone call to wife.  She is able to verify HIPAA.  She states patient is much better today.  She states she stopped the Remeron as patient was so sleepy during the day and she states that has made the difference.  She states that home health is involved.  She states that a hospital bed has been requested just waiting for MD approval.  Patient saw the neurologist and sinemet has been ordered and wife is pleased.  She states that patient has not had any falls.  Discussed [facility] information and falls precautions.  Patient has paid caregivers that come in and wife feels pretty good about patient progress.  She voices no concerns and is agreeable to call next week.     Plan: RN CM will contact again next week.   Jone Baseman, RN, MSN [place] Management Care Management Coordinator Direct Line [phone] Cell [phone] Toll Free: [phone]  Fax: [phone]

## 2018-09-03 NOTE — Telephone Encounter (Signed)
Faxed to [place] at [phone].

## 2018-09-07 DIAGNOSIS — Z8673 Personal history of transient ischemic attack (TIA), and cerebral infarction without residual deficits: Secondary | ICD-10-CM | POA: Diagnosis not present

## 2018-09-07 DIAGNOSIS — G629 Polyneuropathy, unspecified: Secondary | ICD-10-CM | POA: Diagnosis not present

## 2018-09-07 DIAGNOSIS — G20 Parkinson's disease: Secondary | ICD-10-CM | POA: Diagnosis not present

## 2018-09-07 DIAGNOSIS — M1712 Unilateral primary osteoarthritis, left knee: Secondary | ICD-10-CM | POA: Diagnosis not present

## 2018-09-07 DIAGNOSIS — K5730 Diverticulosis of large intestine without perforation or abscess without bleeding: Secondary | ICD-10-CM | POA: Diagnosis not present

## 2018-09-07 DIAGNOSIS — N183 Chronic kidney disease, stage 3 (moderate): Secondary | ICD-10-CM | POA: Diagnosis not present

## 2018-09-07 DIAGNOSIS — I480 Paroxysmal atrial fibrillation: Secondary | ICD-10-CM | POA: Diagnosis not present

## 2018-09-07 DIAGNOSIS — Z7901 Long term (current) use of anticoagulants: Secondary | ICD-10-CM | POA: Diagnosis not present

## 2018-09-07 DIAGNOSIS — F0280 Dementia in other diseases classified elsewhere without behavioral disturbance: Secondary | ICD-10-CM | POA: Diagnosis not present

## 2018-09-07 DIAGNOSIS — I129 Hypertensive chronic kidney disease with stage 1 through stage 4 chronic kidney disease, or unspecified chronic kidney disease: Secondary | ICD-10-CM | POA: Diagnosis not present

## 2018-09-10 ENCOUNTER — Other Ambulatory Visit: Payer: Self-pay

## 2018-09-10 ENCOUNTER — Telehealth: Payer: Self-pay | Admitting: Internal Medicine

## 2018-09-10 ENCOUNTER — Ambulatory Visit (HOSPITAL_COMMUNITY)
Admission: RE | Admit: 2018-09-10 | Discharge: 2018-09-10 | Disposition: A | Discharge status: Home / Self Care | Payer: PPO | Source: Ambulatory Visit | Attending: Urology | Admitting: Urology

## 2018-09-10 ENCOUNTER — Other Ambulatory Visit (HOSPITAL_COMMUNITY): Payer: Self-pay | Admitting: Urology

## 2018-09-10 DIAGNOSIS — C641 Malignant neoplasm of right kidney, except renal pelvis: Secondary | ICD-10-CM | POA: Diagnosis not present

## 2018-09-10 DIAGNOSIS — I10 Essential (primary) hypertension: Secondary | ICD-10-CM | POA: Diagnosis not present

## 2018-09-10 DIAGNOSIS — Z85528 Personal history of other malignant neoplasm of kidney: Secondary | ICD-10-CM | POA: Diagnosis not present

## 2018-09-10 MED ORDER — POLYETHYLENE GLYCOL 3350 17 GM/SCOOP PO POWD: 17.0000 g | Freq: Two times a day (BID) | ORAL | 1 refills | Status: AC | PRN

## 2018-09-10 NOTE — Addendum Note (Signed)
Addended by: Biagio Borg on: 09/10/2018 03:17 PM   Modules accepted: Orders

## 2018-09-10 NOTE — Patient Outreach (Signed)
[place] [facility]) Care Management  09/10/2018  Jeffrey Hardy [birth year] [id number]   Telephone call to spouse [name] for weekly call.  She is able to verify HIPAA. She states that patient is doing pretty good.  They have been out today for labs and xray for urology appointment next week. She states that patient has some stiffness at times but no more falls.  He continues to have some incontinence but will be discussing with the urologist on next week.  Home health continues and nurse is due to come today.  She states they have not received the hospital bed yet.  Advised that per MD office notes the order was faxed to [facility] on Thursday.  Wife to discuss with home health nurse today upon visit.  Wife declines any further needs at this time.  Plan: RN CM will outreach next week and wife agrees to next outreach.    Jone Baseman, RN, MSN [place] Management Care Management Coordinator Direct Line [phone] Cell [phone] Toll Free: [phone]  Fax: [phone]

## 2018-09-10 NOTE — Telephone Encounter (Signed)
[place] for [employer] daily - I have sent to pharmacy

## 2018-09-10 NOTE — Telephone Encounter (Signed)
Copied from [place] [id number]. Topic: General - Other >> Sep 10, 2018  2:52 PM Carolyn Stare wrote: Caller/Agency Ria Comment with [facility]  cal lto say pt has not had a bowel movement since Sunday and is asking what Dr Jenny Reichmann would recommend   Can contact wife and she will get what the doctor recommend

## 2018-09-10 NOTE — Telephone Encounter (Signed)
[name] has been informed and will pick up med.

## 2018-09-14 ENCOUNTER — Ambulatory Visit: Payer: PPO | Admitting: Neurology

## 2018-09-15 ENCOUNTER — Encounter: Payer: PPO | Admitting: Psychology

## 2018-09-16 ENCOUNTER — Other Ambulatory Visit: Payer: Self-pay

## 2018-09-16 NOTE — Patient Outreach (Signed)
[facility]) Care Management  09/16/2018  YATES WEISGERBER [birth year] [id number]   Incoming return call from wife.  She is able to verify HIPAA.  She states that she is pleased with patient progress.  She states that daughter is visiting right now.  She states that PT is continuing and that therapist is pleased with progress.  She states that patient has less incontinent episodes and to see urology on tomorrow.  Advised wife on [place] to take over calls and that she will receive a letter.  She verbalized understanding and voices no concerns.    Plan: RN CM will close case when appropriate.    Jone Baseman, RN, MSN [place] Management Care Management Coordinator Direct Line [phone] Cell [phone] Toll Free: [phone]  Fax: [phone]

## 2018-09-17 DIAGNOSIS — N401 Enlarged prostate with lower urinary tract symptoms: Secondary | ICD-10-CM | POA: Diagnosis not present

## 2018-09-17 DIAGNOSIS — C641 Malignant neoplasm of right kidney, except renal pelvis: Secondary | ICD-10-CM | POA: Diagnosis not present

## 2018-09-17 DIAGNOSIS — N3941 Urge incontinence: Secondary | ICD-10-CM | POA: Diagnosis not present

## 2018-09-21 ENCOUNTER — Telehealth: Payer: Self-pay | Admitting: Neurology

## 2018-09-21 ENCOUNTER — Other Ambulatory Visit: Payer: Self-pay | Admitting: Internal Medicine

## 2018-09-21 DIAGNOSIS — C649 Malignant neoplasm of unspecified kidney, except renal pelvis: Secondary | ICD-10-CM

## 2018-09-21 DIAGNOSIS — F0390 Unspecified dementia without behavioral disturbance: Secondary | ICD-10-CM

## 2018-09-21 DIAGNOSIS — I639 Cerebral infarction, unspecified: Secondary | ICD-10-CM

## 2018-09-21 NOTE — Telephone Encounter (Signed)
Spoke with patient's wife [name] and discussed per Dr. Jaynee Eagles, ok to stop the mid-day dose of Sinemet. She verbalized appreciation. She stated she held that dose a couple days to test and he did well. He has trouble making it through therapy with the mid-day dose. She asked about the end of month appt. I scheduled pt for Thurs 08/01/2018 @ 4:00 pm arrival 15-30 minutes early. Advised at least one family member can come back with pt (d/t COVID19). Will see if ok for a second family member. She verbalized appreciation and understanding. COVID19 screening questions passed. She understands the check-in process and that screening will be done again on arrival.

## 2018-09-21 NOTE — Telephone Encounter (Signed)
That is fine, we don;t want to cause more problems. thanks

## 2018-09-21 NOTE — Telephone Encounter (Signed)
Pt wife has called to see if Dr Jaynee Eagles will approve her no longer giving pt the mid day does of carbidopa-levodopa (SINEMET IR) 25-100 MG tablet because it makes pt very sleepy and unwilling to walk.

## 2018-09-23 ENCOUNTER — Ambulatory Visit (HOSPITAL_COMMUNITY)
Admission: RE | Admit: 2018-09-23 | Discharge: 2018-09-23 | Disposition: A | Discharge status: Home / Self Care | Payer: PPO | Source: Ambulatory Visit | Attending: Neurology | Admitting: Neurology

## 2018-09-23 ENCOUNTER — Other Ambulatory Visit: Payer: Self-pay

## 2018-09-23 DIAGNOSIS — R2681 Unsteadiness on feet: Secondary | ICD-10-CM | POA: Diagnosis not present

## 2018-09-23 DIAGNOSIS — G20 Parkinson's disease: Secondary | ICD-10-CM | POA: Insufficient documentation

## 2018-09-23 DIAGNOSIS — F0390 Unspecified dementia without behavioral disturbance: Secondary | ICD-10-CM | POA: Diagnosis not present

## 2018-09-23 MED ORDER — IOFLUPANE I 123 185 MBQ/2.5ML IV SOLN
4.9100 | Freq: Once | INTRAVENOUS | Status: AC
  Administered 2018-09-23: 4.91 via INTRAVENOUS

## 2018-09-23 MED ORDER — IODINE STRONG (LUGOLS) 5 % PO SOLN
0.8000 mL | Freq: Once | ORAL | Status: AC
  Administered 2018-09-23: 0.8 mL via ORAL

## 2018-09-25 ENCOUNTER — Other Ambulatory Visit: Payer: Self-pay

## 2018-09-25 NOTE — Patient Outreach (Signed)
[place] [facility]) Care Management  09/25/2018  ADONIAS DEMORE 02-09-1936 [id number]   Patient case closed as case transferred to [facility] for further care management.   Plan: RN CM will notify physician.    Jone Baseman, RN, MSN [place] Management Care Management Coordinator Direct Line [phone] Cell [phone] Toll Free: [phone]  Fax: [phone]

## 2018-09-30 NOTE — Telephone Encounter (Signed)
Spoke with pt's wife and discussed appt reminder. She and the patient and the daughter will bring masks. She is aware they will be asked the [place] screening questions upon arrival as well. She denies they have been traveling or having any fever or respiratory symptoms.

## 2018-10-01 ENCOUNTER — Ambulatory Visit (INDEPENDENT_AMBULATORY_CARE_PROVIDER_SITE_OTHER): Payer: PPO | Admitting: Neurology

## 2018-10-01 ENCOUNTER — Encounter: Payer: Self-pay | Admitting: Neurology

## 2018-10-01 ENCOUNTER — Other Ambulatory Visit: Payer: Self-pay

## 2018-10-01 DIAGNOSIS — G214 Vascular parkinsonism: Secondary | ICD-10-CM

## 2018-10-01 NOTE — Progress Notes (Signed)
[facility]    Provider:  Dr Jaynee Eagles Primary Care Provider:  Biagio Borg, MD  CC:  Dementia, abnormal gait  Interval history 10/01/2018: Patient is here with his daughter and his wife today.  This is the first time I am meeting his daughter.  I reviewed all prior visits with daughter and wife and patient.  I also reviewed MRI of the brain showing pronounced atrophy and microvascular changes, DaTscan which did not show Lewy body dementia or Parkinson's disease and explained this likely means at this is a vascular parkinsonism and vascular dementia, I explained prognosis of all of these disorders and answered all questions.  MRI of the cervical spine did show some moderate stenosis but nothing to be causing his symptoms and I would not recommend surgical intervention at this time anyway.  This was an extended visit in person, I answered all questions, patient is not tolerating Sinemet.  Explained with vascular parkinsonism it is of minimal help in any case using dopamine replacement.  We discussed falls.  Patient did not have his formal neurocognitive testing completed because of a fall, at this point we discussed it probably will not change management and they have opted not to continue.  The challenge will be keeping patient safe from falls going forward.    Prior video visit: Patient has had 2 falls.  He walked into the bedroom, started to back up, lost balance and went down this past Sunday. Again later that day he felt weak and he crumbled onto his knees. Prior they were doing relatively well. Slower, imbalance. He sometimes can't move his legs. A lot of stiffness.  There is supposed to have a dopamine transporter scan but it was canceled due to COVID, I coordinated with our office referral staff to get them rescheduled.  This will be an important exam to evaluate whether his parkinsonism is vascular versus parkinsonian such as Parkinson's disease or Lewy body dementia.  We  discussed this with the wife.  In the meantime we will add Sinemet to see if this will help with his stiffness and freezing.  His wife appears to be quite intelligent and very knowledgeable I feel like she understands his diagnosis and prognosis including dementia.  Discussed fall precautions.  They have had physical therapy and Occupational Therapy.  HPI:  PIPER ALBRO is a 83 y.o. male here as requested by provider for foot drop.  Patient was initially seen by 1 of my colleagues in the office and also had an EMG nerve conduction study done here.  He has a past medical history of peripheral neuropathy, atrial fibrillation, chronic kidney disease, syncope, hip fracture, memory impairment, herpes zoster, cerebral atherosclerosis, hyperlipidemia, essential hypertension.  Patient with a right foot drop since the beginning of October, at the end of October he fell as a result of the foot drop and fractured his hip.  [facility] was evaluated at [facility] and no significant spinal pathology was seen to account for his right foot drop.  MRI of the lumbar spine was also completed and part of this evaluation.  Patient has no significant back pain and no high-grade significant neural compression on his MRI that would cause the foot drop. Here with wife who provides most information. Foot dropped started early October/late September. Wife noticed slapping of the foot. She noticed it first time in the morning. He slept in his bed and slept on his back. He has had 2 hip surgeries. He was not allowed to  sleep on your side, he may stay in the same position all night. Hip surgery was months prior. The next hip surgery was after the foot drop. They went to physical therapy. Did not improve. Right leg has the foot drop. He has B12 deficiency and just started to take b12. No numbness and tingling in the feet. MRI of the lumbar spine was negative. Walking difficulty started in September. Having difficulty getting up. Doesn't move  like he did. No tremor. His posture is worsening . Everything started after the first hip replacement. Memory loss ongoing since stroke, progressive memory loss and slowing.   Reviewed notes, labs and imaging from outside physicians, which showed:  Reviewed notes from Dr. Melina Schools.  This is an 83 year old male.  Distally he has a partial foot drop unchanged from prior.  He has palpable pulses.  No significant pedal edema.  No calf tenderness to palpitation.  EMG nerve conduction study revealed mixed peripheral neuropathy.  He is status post right total hip arthroplasty for displaced femoral neck fracture doing well.  On physical exam he states no numbness tingling or difficulty with balance.  Patient has an obvious foot drop with 3 out of 5 EHL tibialis anterior gastrocnemius strength on the right side.  He also has 4-5 hip flexor and quad strength in the right side.  Left is 5 out of 5.  He does complain of some numbness in the L5 and S1 dermatome on the right side but no significant radicular pain in the leg.  Negative Babinski, no clonus, 1+ deep tendon reflexes at the knees and absent at the Achilles.  MRI of the lumbar spine October 2019 showed moderate to significant disc osteophyte complex asymmetric to the right but no spinal canal stenosis.  Mild foraminal narrowing.  Mild degenerative changes at L4-L5 but no significant stenosis.  Similar findings at L2-L3 and L3-L4.  There is also findings of lesions in the right and left lobes of the liver as well as cysts in the kidney.  Foot drop apparently occurred in the early part of October, and he is fallen since then as a result of the foot drop and fractured his hip.  There is no significant spinal pathology that would account for the right foot drop.  Reviewed nerve conduction study, images and data and agree with the following: The distal motor latencies for the peroneal nerves were prolonged on the right and normal on the left with a low motor  amplitude on the right normal on the left.  The distal motor latencies for the posterior tibial nerves were prolonged on the right, normal on the left with a low motor amplitude on the right and normal on the left.  Slowing was seen for the peroneal nerves bilaterally much more significant on the right peroneal nerve.  There was slowing for the right posterior tib nerve, borderline normal on the left and unobtainable for the peroneal nerves bilaterally.  The F-wave latencies per the posterior tibial nerves were prolonged bilaterally. After review of the data I would also add that there is a drop across the fibular head greater than 10 m/s on the right accounting for the asymmetries and localizing at the fibular head.  MRI of the brain in 2014 showed extensive white matter changes, advanced microvascular chronic ischemic changes and global atrophy.  At that time he also had an acute small infarct in the posterior right frontal lobe involving the medial aspect of the right motor strip.  Also other  remote infarcts bilaterally.  Review of Systems: Patient complains of symptoms per HPI as well as the following symptoms: falls. Pertinent negatives and positives per HPI. All others negative.   Social History   Socioeconomic History   Marital status: Married    Spouse name: [name]    Number of children: 2   Years of education: Not on file   Highest education level: Some college, no degree  Occupational History   Occupation: retired  Scientist, product/process development strain: Not on file   Food insecurity    Worry: Not on file    Inability: Not on Lexicographer needs    Medical: Not on file    Non-medical: Not on file  Tobacco Use   Smoking status: Former Smoker    Years: 5.00    Types: Cigarettes, Pipe    Quit date: 09/07/1966    Years since quitting: 52.1   Smokeless tobacco: Never Used  Substance and Sexual Activity   Alcohol use: No    Alcohol/week: 0.0 standard drinks     Drug use: No   Sexual activity: Yes  Lifestyle   Physical activity    Days per week: Not on file    Minutes per session: Not on file   Stress: Not on file  Relationships   Social connections    Talks on phone: Not on file    Gets together: Not on file    Attends religious service: Not on file    Active member of club or organization: Not on file    Attends meetings of clubs or organizations: Not on file    Relationship status: Not on file   Intimate partner violence    Fear of current or ex partner: Not on file    Emotionally abused: Not on file    Physically abused: Not on file    Forced sexual activity: Not on file  Other Topics Concern   Not on file  Social History Narrative   Pt's son is an Dance movement psychotherapist. Patient is retired from [employer]   Left handed    2 cups of caffeine per day    Lives at home with wife     Family History  Problem Relation Age of Onset   Heart Problems Mother    Brain cancer Father    Parkinsonism Brother    Dementia Brother    Colon cancer Neg Hx    Esophageal cancer Neg Hx    Stomach cancer Neg Hx    Gastric cancer Neg Hx    Liver disease Neg Hx    Kidney disease Neg Hx    Diabetes Neg Hx     Past Medical History:  Diagnosis Date   Anxiety    Bilateral renal cysts    Cancer ([place])    renal mass   Dehydration 08/2018   Depression    Diverticulosis of colon    Erectile dysfunction    Hematuria    History of cerebral embolic infarction x2 CVA's ---  neurologist-  [name]--- per pt residual abnormal gait , does need cane   12-16-2011  right frontal embolic infarct and [id number] left frontal embolic infarct (per first MRI previous hemorrgaic cerebral ischemia)   History of duodenal ulcer    4496   History of Helicobacter pylori infection    1997   History of hiatal hernia    History of loop recorder    Current   History of syncope  1997  and 2014  s/p  loop recorder   Hyperlipidemia     Hypertension    Nocturia    PAF (paroxysmal atrial fibrillation) [facility])    cardiologist-  dr [name]   Peripheral neuropathy 05/04/2018   Renal cell cancer ([place]) 09/06/2016   Right renal mass    Status post placement of implantable loop recorder    07-06-2012   Stroke [facility])    2013 2 mild strokes mild weakness bil sides   Urgency of urination    Wears hearing aid    right ear only    Patient Active Problem List   Diagnosis Date Noted   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   Falls frequently 08/28/2018   Gait disorder 08/24/2018   Insomnia 08/24/2018   Peripheral neuropathy 05/04/2018   Syncope 02/06/2018   Closed displaced fracture of right femoral neck ([place]) 02/01/2018   PAF (paroxysmal atrial fibrillation) ([place]) 01/30/2018   Closed right hip fracture, initial encounter ([place]) 01/29/2018   CKD (chronic kidney disease) stage 3, GFR 30-59 ml/min ([place]) 09/04/2017   Hip fracture ([place]) 09/04/2017   Renal cell cancer ([place]) 09/06/2016   Urinary frequency 03/08/2016   Dementia without behavioral disturbance ([place]) 03/08/2016   History of colonic polyps 07/07/2015   Hyperglycemia 07/04/2015   Cerebrovascular disease, arteriosclerotic, post-stroke 12/28/2013   Shingles 06/29/2013   Incontinent of urine 12/29/2012   Thrombocytopenia ([place]) 01/13/2012   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   Preventative health care 07/27/2010   Hyperlipidemia 05/12/2008   Essential hypertension 05/12/2008   GERD 05/12/2008   DIVERTICULOSIS, COLON 05/12/2008    Past Surgical History:  Procedure Laterality Date   APPENDECTOMY  age 68   COLONOSCOPY  last one 07-17-2015   CYSTOSCOPY/RETROGRADE/URETEROSCOPY Right 04/22/2016   Procedure: CYSTOSCOPY/RETROGRADE/URETEROSCOPY/ BIOPSY/ STENT PLACEMENT;  Surgeon: [name], MD;  Location: [facility];  Service: Urology;  Laterality: Right;   ESOPHAGOGASTRODUODENOSCOPY  last one 09-16-2014   LOOP RECORDER  IMPLANT N/A 07/06/2012   Procedure: LOOP RECORDER IMPLANT;  Surgeon: [name], MD;  Location: [facility] CATH LAB;  Service: Cardiovascular;  Laterality: N/A;   ROBOT ASSITED LAPAROSCOPIC NEPHROURETERECTOMY Right 05/13/2016   Procedure: XI ROBOT ASSITED LAPAROSCOPIC NEPHROURETERECTOMY;  Surgeon: [name], MD;  Location: [facility];  Service: Urology;  Laterality: Right;   TEE WITHOUT CARDIOVERSION  12/19/2011   Procedure: TRANSESOPHAGEAL ECHOCARDIOGRAM (TEE);  Surgeon: [name], MD;  Location[facility] ENDOSCOPY;  Service: Cardiovascular;  Laterality: N/A;  no evidence thrombus in the atrial cavity or appendage;  mild AV thickened w/ mild AI;  minimal fixed plaque in thoracic aorta;  mild MV thickened w/ mild MR;  NO pfo by color doppler or with injection of agitated saline   TOTAL HIP ARTHROPLASTY Left 09/07/2017   Procedure: TOTAL HIP ARTHROPLASTY ANTERIOR APPROACH;  Surgeon: Rod Can, MD;  Location: [facility];  Service: Orthopedics;  Laterality: Left;   TOTAL HIP ARTHROPLASTY Right 02/01/2018   Procedure: TOTAL HIP ARTHROPLASTY ANTERIOR APPROACH;  Surgeon: Rod Can, MD;  Location: [place];  Service: Orthopedics;  Laterality: Right;   TRANSTHORACIC ECHOCARDIOGRAM  09-10-20103   grade 1 diastolic dysfunction,  ef 55-60%/  AV sclerosis without stenosis/  trivial TR   WRIST FRACTURE SURGERY Right 2014  approx.   retained hardware    Current Outpatient Medications  Medication Sig Dispense Refill   B Complex Vitamins (VITAMIN B COMPLEX PO) Take 1 tablet by mouth daily. "high powered"     carbidopa-levodopa (SINEMET IR) 25-100 MG tablet  Take 1/2 or 1 tab 3 times a day. Take 30 minutes before meals 4 hours apart. For example, 8am, noon and 4pm. 90 tablet 6   Cholecalciferol (VITAMIN D3) 5000 units CAPS Take 5,000 Units by mouth daily.      ELIQUIS 2.5 MG TABS tablet Take 2.5 mg by mouth 2 (two) times daily.      ferrous sulfate 325 (65 FE) MG tablet Take 325 mg by mouth daily with  breakfast.     polyethylene glycol powder (GLYCOLAX/MIRALAX) 17 GM/SCOOP powder Take 17 g by mouth 2 (two) times daily as needed. 3350 g 1   tamsulosin (FLOMAX) 0.4 MG CAPS capsule Take 1 capsule (0.4 mg total) by mouth daily after breakfast. 90 capsule 3   mirtazapine (REMERON) 7.5 MG tablet Take 1 tablet (7.5 mg total) by mouth at bedtime. (Patient not taking: Reported on 10/01/2018) 90 tablet 3   No current facility-administered medications for this visit.     Allergies as of 10/01/2018 - Review Complete 10/01/2018  Allergen Reaction Noted   Acyclovir and related Other (See Comments) 05/09/2016    Vitals: BP 137/74 (BP Location: Left Arm, Patient Position: Sitting)    Pulse 64    Temp 98.2 F (36.8 C) Comment: daughter and wife temps both 98.2, taken by front staff   Ht 6\' 1"  (1.854 m)    Wt 200 lb (90.7 kg)    BMI 26.39 kg/m  Last Weight:  Wt Readings from Last 1 Encounters:  10/01/18 200 lb (90.7 kg)   Last Height:   Ht Readings from Last 1 Encounters:  10/01/18 6\' 1"  (1.854 m)   Prior examination:  Physical exam: Exam: Gen: NAD, conversant, pleasant                    CV: RRR, no MRG. No Carotid Bruits. No peripheral edema, warm, nontender Eyes: Conjunctivae clear without exudates or hemorrhage  Neuro: Detailed Neurologic Exam  Speech:    Speech is normal; fluent and spontaneous with impaired comprehension.  Cognition:      The patient is oriented to person     recent and remote memory impaired;     language fluent;     Impaired attention, concentration, fund of knowledge Cranial Nerves: hypomimia/masked facies     The pupils are equal, round, and reactive to light.  Attempted funduscopic exam could not visualize due to small pupils. Visual fields are full to finger confrontation. Extraocular movements are intact. Trigeminal sensation is intact and the muscles of mastication are normal. The face is symmetric. The palate elevates in the midline. Hearing intact.  Voice is normal. Shoulder shrug is normal. The tongue has normal motion without fasciculations.   Coordination:    No dysmetria.   Gait:    Decreased arm swing right > left, stooped posture, narrow gait and low clearance slightly shuffling.  Motor Observation:    no involuntary movements noted, no tremor Tone:    Normal muscle tone.      Strength: right DF 3/5. Good inversion of foot. Slight weakness eversion of foot. 4/5 right dorsiflexion.  Otherwise strength is V/V in the upper and lower limbs.      Sensation: intact to LT     Reflex Exam:  DTR's:   Absent AJs.  Otherwise deep tendon reflexes in the upper and lower extremities are normal bilaterally.   Toes:    The toes are equiv bilaterally.   Clonus:    Clonus is absent.  Assessment/Plan: 83 y.o. 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  -  MRI of the brain 2013 showed extensive white matter changes consistent with advanced chronic ischemic microvascular changes as well as global atrophy.  Repeat MRI of the brain showed progression.  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  No acute changes, but progression of already known disease.  MRI of the cervical spine show degenerative changes and mild to moderate cervical stenosis but no cause for his parkinsonism.  We reviewed all images today.  -We will start Sinemet see if we can help with patient's residual today and freezing.  Patient had side effects.  I did explain  in a vascular parkinsonism that Sinemet has minimal clinical improvement and that if he is having side effects I would stop.  - Neuropsych testing was canceled due to patient falling.  We discussed that we can reschedule but likely will not change her management going forward inpatient and daughter and wife elected not to reschedule.  -We discussed fall risk today, ways to stay safe in the home for falls, this will be especially important with patient.  And he can follow-up in the clinic as needed, I am happy to continue following him but I think that wife and daughter will likely return back to primary care.   Cc: Biagio Borg, MD   A total of 70 minutes was spent face-to-face with this patient. Over half this time was spent on counseling patient on the  1. 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     Sarina Ill, MD  [facility] [address] [place], [place] [address]  Phone [phone] Fax [phone]

## 2018-10-05 DIAGNOSIS — G214 Vascular parkinsonism: Secondary | ICD-10-CM | POA: Insufficient documentation

## 2018-10-07 DIAGNOSIS — Z8673 Personal history of transient ischemic attack (TIA), and cerebral infarction without residual deficits: Secondary | ICD-10-CM | POA: Diagnosis not present

## 2018-10-07 DIAGNOSIS — G629 Polyneuropathy, unspecified: Secondary | ICD-10-CM | POA: Diagnosis not present

## 2018-10-07 DIAGNOSIS — I480 Paroxysmal atrial fibrillation: Secondary | ICD-10-CM | POA: Diagnosis not present

## 2018-10-07 DIAGNOSIS — I129 Hypertensive chronic kidney disease with stage 1 through stage 4 chronic kidney disease, or unspecified chronic kidney disease: Secondary | ICD-10-CM | POA: Diagnosis not present

## 2018-10-07 DIAGNOSIS — N183 Chronic kidney disease, stage 3 (moderate): Secondary | ICD-10-CM | POA: Diagnosis not present

## 2018-10-07 DIAGNOSIS — F0280 Dementia in other diseases classified elsewhere without behavioral disturbance: Secondary | ICD-10-CM | POA: Diagnosis not present

## 2018-10-07 DIAGNOSIS — G20 Parkinson's disease: Secondary | ICD-10-CM | POA: Diagnosis not present

## 2018-10-07 DIAGNOSIS — M1712 Unilateral primary osteoarthritis, left knee: Secondary | ICD-10-CM | POA: Diagnosis not present

## 2018-10-07 DIAGNOSIS — Z7901 Long term (current) use of anticoagulants: Secondary | ICD-10-CM | POA: Diagnosis not present

## 2018-10-07 DIAGNOSIS — K5730 Diverticulosis of large intestine without perforation or abscess without bleeding: Secondary | ICD-10-CM | POA: Diagnosis not present

## 2018-11-06 DIAGNOSIS — H524 Presbyopia: Secondary | ICD-10-CM | POA: Diagnosis not present

## 2018-11-11 DIAGNOSIS — C4441 Basal cell carcinoma of skin of scalp and neck: Secondary | ICD-10-CM | POA: Diagnosis not present

## 2018-11-11 DIAGNOSIS — L570 Actinic keratosis: Secondary | ICD-10-CM | POA: Diagnosis not present

## 2018-11-11 DIAGNOSIS — D485 Neoplasm of uncertain behavior of skin: Secondary | ICD-10-CM | POA: Diagnosis not present

## 2018-11-11 DIAGNOSIS — L218 Other seborrheic dermatitis: Secondary | ICD-10-CM | POA: Diagnosis not present

## 2018-11-18 DIAGNOSIS — C44319 Basal cell carcinoma of skin of other parts of face: Secondary | ICD-10-CM | POA: Diagnosis not present

## 2019-01-01 ENCOUNTER — Encounter: Payer: Self-pay | Admitting: Internal Medicine

## 2019-01-01 ENCOUNTER — Other Ambulatory Visit: Payer: Self-pay

## 2019-01-01 ENCOUNTER — Ambulatory Visit (INDEPENDENT_AMBULATORY_CARE_PROVIDER_SITE_OTHER): Payer: PPO | Admitting: Internal Medicine

## 2019-01-01 VITALS — BP 120/80 | HR 86 | Temp 97.8°F | Ht 73.0 in | Wt 203.0 lb

## 2019-01-01 DIAGNOSIS — N183 Chronic kidney disease, stage 3 unspecified: Secondary | ICD-10-CM

## 2019-01-01 DIAGNOSIS — R739 Hyperglycemia, unspecified: Secondary | ICD-10-CM | POA: Diagnosis not present

## 2019-01-01 DIAGNOSIS — Z0000 Encounter for general adult medical examination without abnormal findings: Secondary | ICD-10-CM

## 2019-01-01 DIAGNOSIS — I10 Essential (primary) hypertension: Secondary | ICD-10-CM

## 2019-01-01 DIAGNOSIS — Z23 Encounter for immunization: Secondary | ICD-10-CM

## 2019-01-01 DIAGNOSIS — E559 Vitamin D deficiency, unspecified: Secondary | ICD-10-CM | POA: Diagnosis not present

## 2019-01-01 DIAGNOSIS — E785 Hyperlipidemia, unspecified: Secondary | ICD-10-CM | POA: Diagnosis not present

## 2019-01-01 MED ORDER — ELIQUIS 2.5 MG PO TABS: 2.5000 mg | ORAL_TABLET | Freq: Two times a day (BID) | ORAL | 3 refills | Status: DC

## 2019-01-01 NOTE — Progress Notes (Signed)
Subjective:    Patient ID: Jeffrey Hardy, male    DOB: [birth year], 83 y.o.   MRN: [id number]  HPI  Here to f/u; overall doing ok,  Pt denies chest pain, increasing sob or doe, wheezing, orthopnea, PND, increased LE swelling, palpitations, dizziness or syncope.  Pt denies new neurological symptoms such as new headache, or facial or extremity weakness or numbness.  Pt denies polydipsia, polyuria, or low sugar episode.  Pt states overall good compliance with meds, mostly trying to follow appropriate diet, with wt overall stable,  but little exercise however.  No recent falls. 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   Pt denies fever, wt loss, night sweats, loss of appetite, or other constitutional symptoms  No new complaints Past Medical History:  Diagnosis Date  . Anxiety   . Bilateral renal cysts   . Cancer ([place])    renal mass  . Dehydration 08/2018  . Depression   . Diverticulosis of colon   . Erectile dysfunction   . Hematuria   . History of cerebral embolic infarction x2 CVA's ---  neurologist-  [name]--- per pt residual abnormal gait , does need cane   12-16-2011  right frontal embolic infarct and 99991111 left frontal embolic infarct (per first MRI previous hemorrgaic cerebral ischemia)  . History of duodenal ulcer    1997  . History of Helicobacter pylori infection    1997  . History of hiatal hernia   . History of loop recorder    Current  . History of syncope    1997  and 2014  s/p  loop recorder  . Hyperlipidemia   . Hypertension   . Nocturia   . PAF (paroxysmal atrial fibrillation) [facility])    cardiologist-  dr [name]  . Peripheral neuropathy 05/04/2018  . Renal cell cancer ([place]) 09/06/2016  . Right renal mass   . Status post placement of implantable loop recorder    07-06-2012  . Stroke [facility])    2013 2 mild strokes mild weakness bil sides  . Urgency of urination   . Wears hearing aid    right ear only   Past  Surgical History:  Procedure Laterality Date  . APPENDECTOMY  age 18  . COLONOSCOPY  last one 07-17-2015  . CYSTOSCOPY/RETROGRADE/URETEROSCOPY Right 04/22/2016   Procedure: CYSTOSCOPY/RETROGRADE/URETEROSCOPY/ BIOPSY/ STENT PLACEMENT;  Surgeon: [name], MD;  Location: [facility];  Service: Urology;  Laterality: Right;  . ESOPHAGOGASTRODUODENOSCOPY  last one 09-16-2014  . LOOP RECORDER IMPLANT N/A 07/06/2012   Procedure: LOOP RECORDER IMPLANT;  Surgeon: [name], MD;  Location: [facility] CATH LAB;  Service: Cardiovascular;  Laterality: N/A;  . ROBOT ASSITED LAPAROSCOPIC NEPHROURETERECTOMY Right 05/13/2016   Procedure: XI ROBOT ASSITED LAPAROSCOPIC NEPHROURETERECTOMY;  Surgeon: [name], MD;  Location: [facility];  Service: Urology;  Laterality: Right;  . TEE WITHOUT CARDIOVERSION  12/19/2011   Procedure: TRANSESOPHAGEAL ECHOCARDIOGRAM (TEE);  Surgeon: [name], MD;  Location: [facility] ENDOSCOPY;  Service: Cardiovascular;  Laterality: N/A;  no evidence thrombus in the atrial cavity or appendage;  mild AV thickened w/ mild AI;  minimal fixed plaque in thoracic aorta;  mild MV thickened w/ mild MR;  NO pfo by color doppler or with injection of agitated saline  . TOTAL HIP ARTHROPLASTY Left 09/07/2017   Procedure: TOTAL HIP ARTHROPLASTY ANTERIOR APPROACH;  Surgeon: Rod Can, MD;  Location: [facility];  Service: Orthopedics;  Laterality: Left;  .  TOTAL HIP ARTHROPLASTY Right 02/01/2018   Procedure: TOTAL HIP ARTHROPLASTY ANTERIOR APPROACH;  Surgeon: Rod Can, MD;  Location: [facility];  Service: Orthopedics;  Laterality: Right;  . TRANSTHORACIC ECHOCARDIOGRAM  09-10-20103   grade 1 diastolic dysfunction,  ef 55-60%/  AV sclerosis without stenosis/  trivial TR  . WRIST FRACTURE SURGERY Right 2014  approx.   retained hardware    reports that he quit smoking about 52 years ago. His smoking use included cigarettes and pipe. He quit after 5.00 years of use. He has never used  smokeless tobacco. He reports that he does not drink alcohol or use drugs. family history includes Brain cancer in his father; Dementia in his brother; Heart Problems in his mother; Parkinsonism in his brother. Allergies  Allergen Reactions  . Acyclovir And Related Other (See Comments)    Unsure of what happens   Current Outpatient Medications on File Prior to Visit  Medication Sig Dispense Refill  . B Complex Vitamins (VITAMIN B COMPLEX PO) Take 1 tablet by mouth daily. "high powered"    . Cholecalciferol (VITAMIN D3) 5000 units CAPS Take 5,000 Units by mouth daily.     . ferrous sulfate 325 (65 FE) MG tablet Take 325 mg by mouth daily with breakfast.    . polyethylene glycol powder (GLYCOLAX/MIRALAX) 17 GM/SCOOP powder Take 17 g by mouth 2 (two) times daily as needed. 3350 g 1  . tamsulosin (FLOMAX) 0.4 MG CAPS capsule Take 1 capsule (0.4 mg total) by mouth daily after breakfast. 90 capsule 3  . carbidopa-levodopa (SINEMET IR) 25-100 MG tablet Take 1/2 or 1 tab 3 times a day. Take 30 minutes before meals 4 hours apart. For example, 8am, noon and 4pm. (Patient not taking: Reported on 01/01/2019) 90 tablet 6  . mirtazapine (REMERON) 7.5 MG tablet Take 1 tablet (7.5 mg total) by mouth at bedtime. (Patient not taking: Reported on 01/01/2019) 90 tablet 3   No current facility-administered medications on file prior to visit.    Review of Systems  Constitutional: Negative for other unusual diaphoresis or sweats HENT: Negative for ear discharge or swelling Eyes: Negative for other worsening visual disturbances Respiratory: Negative for stridor or other swelling  Gastrointestinal: Negative for worsening distension or other blood Genitourinary: Negative for retention or other urinary change Musculoskeletal: Negative for other MSK pain or swelling Skin: Negative for color change or other new lesions Neurological: Negative for worsening tremors and other numbness  Psychiatric/Behavioral: Negative  for worsening agitation or other fatigue All other system neg per pt    Objective:   Physical Exam BP 120/80 (BP Location: Left Arm, Patient Position: Sitting, Cuff Size: Large)   Pulse 86   Temp 97.8 F (36.6 C) (Oral)   Ht 6\' 1"  (1.854 m)   Wt 203 lb (92.1 kg)   SpO2 95%   BMI 26.78 kg/m  VS noted,  Constitutional: Pt appears in NAD HENT: Head: NCAT.  Right Ear: External ear normal.  Left Ear: External ear normal.  Eyes: . Pupils are equal, round, and reactive to light. Conjunctivae and EOM are normal Nose: without d/c or deformity Neck: Neck supple. Gross normal ROM Cardiovascular: Normal rate and regular rhythm.   Pulmonary/Chest: Effort normal and breath sounds without rales or wheezing.  Abd:  Soft, NT, ND, + BS, no organomegaly Neurological: Pt is alert. At baseline orientation, motor grossly intact Skin: Skin is warm. No rashes, other new lesions, no LE edema Psychiatric: Pt behavior is normal without agitation  No other  exam findings Lab Results  Component Value Date   WBC 7.6 08/23/2018   HGB 13.2 08/23/2018   HCT 40.0 08/23/2018   PLT 111 (L) 08/23/2018   GLUCOSE 143 (H) 08/23/2018   CHOL 142 06/02/2018   TRIG 97.0 06/02/2018   HDL 35.80 (L) 06/02/2018   LDLCALC 87 06/02/2018   ALT 21 08/23/2018   AST 20 08/23/2018   NA 137 08/23/2018   K 3.6 08/23/2018   CL 107 08/23/2018   CREATININE 1.68 (H) 08/23/2018   BUN 28 (H) 08/23/2018   CO2 22 08/23/2018   TSH 2.30 06/02/2018   PSA 0.90 11/03/2017   INR 1.19 01/29/2018   HGBA1C 5.6 06/02/2018        Assessment & Plan:

## 2019-01-01 NOTE — Patient Instructions (Addendum)
You had the flu shot today  Please continue all other medications as before, and refills have been done if requested.  Please have the pharmacy call with any other refills you may need.  Please continue your efforts at being more active, low cholesterol diet, and weight control.  Please keep your appointments with your specialists as you may have planned  Please return in 6 months, or sooner if needed, with Lab testing done 3-5 days before

## 2019-01-01 NOTE — Assessment & Plan Note (Signed)
stable overall by history and exam, recent data reviewed with pt, and pt to continue medical treatment as before,  to f/u any worsening symptoms or concerns, declines f/u lab today 

## 2019-01-01 NOTE — Assessment & Plan Note (Signed)
stable overall by history and exam, recent data reviewed with pt, and pt to continue medical treatment as before,  to f/u any worsening symptoms or concerns  

## 2019-01-01 NOTE — Assessment & Plan Note (Signed)
stable overall by history and exam, recent data reviewed with pt, and pt to continue medical treatment as before,  to f/u any worsening symptoms or concerns, declines f/u labs today

## 2019-01-06 DIAGNOSIS — L602 Onychogryphosis: Secondary | ICD-10-CM | POA: Diagnosis not present

## 2019-01-06 DIAGNOSIS — M79672 Pain in left foot: Secondary | ICD-10-CM | POA: Diagnosis not present

## 2019-01-06 DIAGNOSIS — M79671 Pain in right foot: Secondary | ICD-10-CM | POA: Diagnosis not present

## 2019-01-06 DIAGNOSIS — L84 Corns and callosities: Secondary | ICD-10-CM | POA: Diagnosis not present

## 2019-02-23 DIAGNOSIS — L570 Actinic keratosis: Secondary | ICD-10-CM | POA: Diagnosis not present

## 2019-02-23 DIAGNOSIS — D0421 Carcinoma in situ of skin of right ear and external auricular canal: Secondary | ICD-10-CM | POA: Diagnosis not present

## 2019-02-23 DIAGNOSIS — Z08 Encounter for follow-up examination after completed treatment for malignant neoplasm: Secondary | ICD-10-CM | POA: Diagnosis not present

## 2019-02-23 DIAGNOSIS — D485 Neoplasm of uncertain behavior of skin: Secondary | ICD-10-CM | POA: Diagnosis not present

## 2019-02-23 DIAGNOSIS — Z85828 Personal history of other malignant neoplasm of skin: Secondary | ICD-10-CM | POA: Diagnosis not present

## 2019-03-02 NOTE — Progress Notes (Signed)
CARDIOLOGY OFFICE NOTE  Date:  03/08/2019    Jeffrey Hardy Date of Birth: 1935-12-27 Medical Record [id number]  PCP:  Jeffrey Borg, MD  Cardiologist:  Jeffrey Hardy   Chief Complaint  Patient presents with  . Follow-up    Seen for Dr. Caryl Hardy    History of Present Illness: Jeffrey Hardy is a 83 y.o. male who presents today for a follow up visit. Seen for Dr. Caryl Hardy.   He has a history of a prior cryptogenic stroke - had ILR implanted in 2014 - found to have AF and was placed on Eliquis.   He had an episode last November of decreased responsiveness following hip surgery. BP was low - wife was with him - laid him down flat. Was better by the time EMS arrived. Noted NSVT on telemetry. Ended up being hospitalized - echo with normal LV function. He was orthostatic.   Last seen in January by Jeffrey Marshall, NP - felt to be doing ok. Had had some falls - not syncope.   The patient does not have symptoms concerning for COVID-19 infection (fever, chills, cough, or new shortness of breath).   Hardy in today. Here with his wife. They both feel he is doing well. No chest pain. Breathing is ok. Weight is stable. BP typically better at home. No recent falls. No real edema. His ILR is "dead" - he did not wish to have explanted. They both feel that he is doing ok. Their daughter has moved here from [place] to help with his care.   Past Medical History:  Diagnosis Date  . Anxiety   . Bilateral renal cysts   . Cancer ([place])    renal mass  . Dehydration 08/2018  . Depression   . Diverticulosis of colon   . Erectile dysfunction   . Hematuria   . History of cerebral embolic infarction x2 CVA's ---  neurologist-  dr Jeffrey Hardy--- per pt residual abnormal gait , does need cane   12-16-2011  right frontal embolic infarct and 99991111 left frontal embolic infarct (per first MRI previous hemorrgaic cerebral ischemia)  . History of duodenal ulcer    1997  . History of Helicobacter pylori infection    1997  . History of hiatal hernia   . History of loop recorder    Current  . History of syncope    1997  and 2014  s/p  loop recorder  . Hyperlipidemia   . Hypertension   . Nocturia   . PAF (paroxysmal atrial fibrillation) [facility])    cardiologist-  dr Jeffrey Hardy  . Peripheral neuropathy 05/04/2018  . Renal cell cancer ([place]) 09/06/2016  . Right renal mass   . Status post placement of implantable loop recorder    07-06-2012  . Stroke [facility])    2013 2 mild strokes mild weakness bil sides  . Urgency of urination   . Wears hearing aid    right ear only    Past Surgical History:  Procedure Laterality Date  . APPENDECTOMY  age 61  . COLONOSCOPY  last one 07-17-2015  . CYSTOSCOPY/RETROGRADE/URETEROSCOPY Right 04/22/2016   Procedure: CYSTOSCOPY/RETROGRADE/URETEROSCOPY/ BIOPSY/ STENT PLACEMENT;  Surgeon: [name], MD;  Location: [facility];  Service: Urology;  Laterality: Right;  . ESOPHAGOGASTRODUODENOSCOPY  last one 09-16-2014  . LOOP RECORDER IMPLANT N/A 07/06/2012   Procedure: LOOP RECORDER IMPLANT;  Surgeon: [name], MD;  Location: [facility] CATH LAB;  Service: Cardiovascular;  Laterality: N/A;  . ROBOT  ASSITED LAPAROSCOPIC NEPHROURETERECTOMY Right 05/13/2016   Procedure: XI ROBOT ASSITED LAPAROSCOPIC NEPHROURETERECTOMY;  Surgeon: [name], MD;  [facility];  Service: Urology;  Laterality: Right;  . TEE WITHOUT CARDIOVERSION  12/19/2011   Procedure: TRANSESOPHAGEAL ECHOCARDIOGRAM (TEE);  Surgeon: [name], MD;  Location: [facility] ENDOSCOPY;  Service: Cardiovascular;  Laterality: N/A;  no evidence thrombus in the atrial cavity or appendage;  mild AV thickened w/ mild AI;  minimal fixed plaque in thoracic aorta;  mild MV thickened w/ mild MR;  NO pfo by color doppler or with injection of agitated saline  . TOTAL HIP ARTHROPLASTY Left 09/07/2017   Procedure: TOTAL HIP ARTHROPLASTY ANTERIOR APPROACH;  Surgeon: Rod Can, MD;  Location: [facility];  Service:  Orthopedics;  Laterality: Left;  . TOTAL HIP ARTHROPLASTY Right 02/01/2018   Procedure: TOTAL HIP ARTHROPLASTY ANTERIOR APPROACH;  Surgeon: Rod Can, MD;  Location: [place];  Service: Orthopedics;  Laterality: Right;  . TRANSTHORACIC ECHOCARDIOGRAM  09-10-20103   grade 1 diastolic dysfunction,  ef 55-60%/  AV sclerosis without stenosis/  trivial TR  . WRIST FRACTURE SURGERY Right 2014  approx.   retained hardware     Medications: Current Meds  Medication Sig  . B Complex Vitamins (VITAMIN B COMPLEX PO) Take 1 tablet by mouth daily. "high powered"  . Cholecalciferol (VITAMIN D3) 5000 units CAPS Take 5,000 Units by mouth daily.   [name] ELIQUIS 2.5 MG TABS tablet Take 1 tablet (2.5 mg total) by mouth 2 (two) times daily.  . ferrous sulfate 325 (65 FE) MG tablet Take 325 mg by mouth daily with breakfast.  . polyethylene glycol powder (GLYCOLAX/MIRALAX) 17 GM/SCOOP powder Take 17 g by mouth 2 (two) times daily as needed.  . tamsulosin (FLOMAX) 0.4 MG CAPS capsule Take 1 capsule (0.4 mg total) by mouth daily after breakfast.     Allergies: Allergies  Allergen Reactions  . Acyclovir And Related Other (See Comments)    Unsure of what happens    Social History: The patient  reports that he quit smoking about 52 years ago. His smoking use included cigarettes and pipe. He quit after 5.00 years of use. He has never used smokeless tobacco. He reports that he does not drink alcohol or use drugs.   Family History: The patient's family history includes Brain cancer in his father; Dementia in his brother; Heart Problems in his mother; Parkinsonism in his brother.   Review of Systems: Please see the history of present illness.   All other systems are reviewed and negative.   Physical Exam: VS:  BP (!) 144/72   Pulse 67   Ht 6\' 1"  (1.854 m)   Wt 201 lb 12.8 oz (91.5 kg)   SpO2 95%   BMI 26.62 kg/m  .  BMI Body mass index is 26.62 kg/m.  Wt Readings from Last 3 Encounters:  03/08/19 201  lb 12.8 oz (91.5 kg)  01/01/19 203 lb (92.1 kg)  10/01/18 200 lb (90.7 kg)    General: Pleasant. Elderly. Alert and in no acute distress. He is in a wheelchair.   HEENT: Normal.  Neck: Supple, no JVD, carotid bruits, or masses noted.  Cardiac: Regular rate and rhythm. Heart tones are distant. No edema.  Respiratory:  Lungs are clear to auscultation bilaterally with normal work of breathing.  GI: Soft and nontender.  MS: No deformity or atrophy. Gait not tested.  Skin: Warm and dry. Color is normal.  Neuro:  Strength and sensation are intact and no  gross focal deficits noted.  Psych: Alert, appropriate and with normal affect.   LABORATORY DATA:  EKG:  EKG is ordered today. This demonstrates NSR with non specific T wave changes.  Lab Results  Component Value Date   WBC 7.6 08/23/2018   HGB 13.2 08/23/2018   HCT 40.0 08/23/2018   PLT 111 (L) 08/23/2018   GLUCOSE 143 (H) 08/23/2018   CHOL 142 06/02/2018   TRIG 97.0 06/02/2018   HDL 35.80 (L) 06/02/2018   LDLCALC 87 06/02/2018   ALT 21 08/23/2018   AST 20 08/23/2018   NA 137 08/23/2018   K 3.6 08/23/2018   CL 107 08/23/2018   CREATININE 1.68 (H) 08/23/2018   BUN 28 (H) 08/23/2018   CO2 22 08/23/2018   TSH 2.30 06/02/2018   PSA 0.90 11/03/2017   INR 1.19 01/29/2018   HGBA1C 5.6 06/02/2018     BNP (last 3 results) No results for input(s): BNP in the last 8760 hours.  ProBNP (last 3 results) No results for input(s): PROBNP in the last 8760 hours.   Other Studies Reviewed Today:  Echo Study Conclusions 02/2018  - Left ventricle: The cavity size was normal. Wall thickness was   normal. Systolic function was normal. The estimated ejection   fraction was in the range of 60% to 65%. Wall motion was normal;   there were no regional wall motion abnormalities. Doppler   parameters are consistent with abnormal left ventricular   relaxation (grade 1 diastolic dysfunction). The E&'e&' ratio is   >15, suggesting elevated  LV filling pressure. - Aortic valve: Sclerosis without stenosis. There was no   regurgitation. - Mitral valve: Mildly thickened leaflets . There was trivial   regurgitation. - Left atrium: The atrium was mildly dilated. - Right atrium: The atrium was normal in size. - Inferior vena cava: The vessel was normal in size. The   respirophasic diameter changes were in the normal range (>= 50%),   consistent with normal central venous pressure.  Impressions:  - LVEF 60-65%, normal wall thickness, normal wall motion, grade 1   DD, elevated LV filling pressure, mild LAE, normal IVC.  ------------------------------------------------------------------- Study data:  Comparison was made to the study of 12/16/2017.  Study status:  Routine.  Procedure:  Transthoracic echocardiography. Image quality was fair.  Study completion:  There were no complications.          Transthoracic echocardiography.  M-mode, complete 2D, spectral Doppler, and color Doppler.  Birthdate: Patient birthdate: [birth year].  Age:  Patient is 83 yr old.  Sex: Gender: male.    BMI: 25.4 kg/m^2.  Blood pressure:     122/67 Patient status:  Inpatient.  Study date:  Study date: 02/07/2018. Study time: 07:43 AM.  Location:  Bedside.  -------------------------------------------------------------------  ------------------------------------------------------------------- Left ventricle:  The cavity size was normal. Wall thickness was normal. Systolic function was normal. The estimated ejection fraction was in the range of 60% to 65%. Wall motion was normal; there were no regional wall motion abnormalities. Doppler parameters are consistent with abnormal left ventricular relaxation (grade 1 diastolic dysfunction). The E&'e&' ratio is >15, suggesting elevated LV filling pressure.  -------------------------------------------------------------------   Assessment/Plan:  1. PAF - remains in NSR by EKG - on anticoagulation.  His ILR is at EOL. Overall, he seems to be holding his own.   2 . Chronic anticoagulation - on Eliquis - lab today. He is on the lower dose now due to age and creatinine.   3. Orthostatic hypotension - BP  is lower at home - no spells noted. Really on no medicines - they are careful about avoiding dehydration.   4. Cryptogenic stroke   5. History of NSVT  6. History of RCC - he is seeing Urology later this week as well for scan per her report.  7.   COVID-19 Education: The signs and symptoms of COVID-19 were discussed with the patient and how to seek care for testing (follow up with PCP or arrange E-visit).  The importance of social distancing, staying at home, hand hygiene and wearing a mask when out in public were discussed today.  Current medicines are reviewed with the patient today.  The patient does not have concerns regarding medicines other than what has been noted above.  The following changes have been made:  See above.  Labs/ tests ordered today include:    Orders Placed This Encounter  Procedures  . Basic metabolic panel  . CBC no Diff  . EKG 12-Lead     Disposition:   FU with Dr. Caryl Hardy in about 6 months.  Patient is agreeable to this plan and will call if any problems develop in the interim.   SignedTruitt Merle, NP  03/08/2019 12:49 PM  [place] [address] [place], [place]  [address] Phone: [phone] Fax: [phone]

## 2019-03-08 ENCOUNTER — Encounter: Payer: Self-pay | Admitting: Nurse Practitioner

## 2019-03-08 ENCOUNTER — Ambulatory Visit (INDEPENDENT_AMBULATORY_CARE_PROVIDER_SITE_OTHER): Payer: PPO | Admitting: Nurse Practitioner

## 2019-03-08 ENCOUNTER — Other Ambulatory Visit: Payer: Self-pay

## 2019-03-08 VITALS — BP 144/72 | HR 67 | Ht 73.0 in | Wt 201.8 lb

## 2019-03-08 DIAGNOSIS — I480 Paroxysmal atrial fibrillation: Secondary | ICD-10-CM | POA: Diagnosis not present

## 2019-03-08 DIAGNOSIS — C641 Malignant neoplasm of right kidney, except renal pelvis: Secondary | ICD-10-CM | POA: Diagnosis not present

## 2019-03-08 DIAGNOSIS — Z79899 Other long term (current) drug therapy: Secondary | ICD-10-CM | POA: Diagnosis not present

## 2019-03-08 DIAGNOSIS — Z7901 Long term (current) use of anticoagulants: Secondary | ICD-10-CM

## 2019-03-08 NOTE — Patient Instructions (Addendum)
After Visit Summary:  We will be checking the following labs today - BMET & CBC   Medication Instructions:    Continue with your current medicines.    If you need a refill on your cardiac medications before your next appointment, please call your pharmacy.     Testing/Procedures To Be Arranged:  N/A  Follow-Up:   See Dr. [name] in about 6 months -You will receive a reminder letter in the mail two months in advance. If you don't receive a letter, please call our office to schedule the follow-up appointment.    At [facility], you and your health needs are our priority.  As part of our continuing mission to provide you with exceptional heart care, we have created designated Provider Care Teams.  These Care Teams include your primary Cardiologist (physician) and Advanced Practice Providers (APPs -  Physician Assistants and Nurse Practitioners) who all work together to provide you with the care you need, when you need it.  Special Instructions:  . Stay safe, stay home, wash your hands for at least 20 seconds and wear a mask when out in public.  . It was good to talk with you both today.    Call the [place] office at [phone] if you have any questions, problems or concerns.

## 2019-03-09 LAB — CBC
Hematocrit: 43 % (ref 37.5–51.0)
Hemoglobin: 14.9 g/dL (ref 13.0–17.7)
MCH: 32.8 pg (ref 26.6–33.0)
MCHC: 34.7 g/dL (ref 31.5–35.7)
MCV: 95 fL (ref 79–97)
Platelets: 158 10*3/uL (ref 150–450)
RBC: 4.54 x10E6/uL (ref 4.14–5.80)
RDW: 12 % (ref 11.6–15.4)
WBC: 5.7 10*3/uL (ref 3.4–10.8)

## 2019-03-09 LAB — BASIC METABOLIC PANEL
BUN/Creatinine Ratio: 12 (ref 10–24)
BUN: 24 mg/dL (ref 8–27)
CO2: 25 mmol/L (ref 20–29)
Calcium: 9.5 mg/dL (ref 8.6–10.2)
Chloride: 105 mmol/L (ref 96–106)
Creatinine, Ser: 1.99 mg/dL — ABNORMAL HIGH (ref 0.76–1.27)
GFR calc Af Amer: 35 mL/min/{1.73_m2} — ABNORMAL LOW (ref >59)
GFR calc non Af Amer: 31 mL/min/{1.73_m2} — ABNORMAL LOW (ref >59)
Glucose: 99 mg/dL (ref 65–99)
Potassium: 4.4 mmol/L (ref 3.5–5.2)
Sodium: 143 mmol/L (ref 134–144)

## 2019-03-15 DIAGNOSIS — N281 Cyst of kidney, acquired: Secondary | ICD-10-CM | POA: Diagnosis not present

## 2019-03-15 DIAGNOSIS — C641 Malignant neoplasm of right kidney, except renal pelvis: Secondary | ICD-10-CM | POA: Diagnosis not present

## 2019-03-30 DIAGNOSIS — N3941 Urge incontinence: Secondary | ICD-10-CM | POA: Diagnosis not present

## 2019-03-30 DIAGNOSIS — R3915 Urgency of urination: Secondary | ICD-10-CM | POA: Diagnosis not present

## 2019-03-30 DIAGNOSIS — N401 Enlarged prostate with lower urinary tract symptoms: Secondary | ICD-10-CM | POA: Diagnosis not present

## 2019-03-30 DIAGNOSIS — C641 Malignant neoplasm of right kidney, except renal pelvis: Secondary | ICD-10-CM | POA: Diagnosis not present

## 2019-04-08 ENCOUNTER — Telehealth: Payer: Self-pay | Admitting: Internal Medicine

## 2019-04-08 NOTE — Telephone Encounter (Signed)
Recv'd records from [facility] forwarded 5 pages to Dr. Cathlean Cower 12/31/20fbg

## 2019-04-16 ENCOUNTER — Encounter: Payer: Self-pay | Admitting: Internal Medicine

## 2019-04-28 ENCOUNTER — Emergency Department (HOSPITAL_BASED_OUTPATIENT_CLINIC_OR_DEPARTMENT_OTHER): Payer: PPO

## 2019-04-28 ENCOUNTER — Ambulatory Visit: Payer: Self-pay

## 2019-04-28 ENCOUNTER — Emergency Department (HOSPITAL_BASED_OUTPATIENT_CLINIC_OR_DEPARTMENT_OTHER)
Admission: EM | Admit: 2019-04-28 | Discharge: 2019-04-28 | Disposition: A | Discharge status: Home / Self Care | Payer: PPO | Attending: Emergency Medicine | Admitting: Emergency Medicine

## 2019-04-28 ENCOUNTER — Encounter (HOSPITAL_BASED_OUTPATIENT_CLINIC_OR_DEPARTMENT_OTHER): Payer: Self-pay | Admitting: *Deleted

## 2019-04-28 ENCOUNTER — Other Ambulatory Visit: Payer: Self-pay

## 2019-04-28 DIAGNOSIS — R200 Anesthesia of skin: Secondary | ICD-10-CM | POA: Diagnosis not present

## 2019-04-28 DIAGNOSIS — E785 Hyperlipidemia, unspecified: Secondary | ICD-10-CM | POA: Diagnosis not present

## 2019-04-28 DIAGNOSIS — Z888 Allergy status to other drugs, medicaments and biological substances status: Secondary | ICD-10-CM | POA: Diagnosis not present

## 2019-04-28 DIAGNOSIS — Z7901 Long term (current) use of anticoagulants: Secondary | ICD-10-CM | POA: Diagnosis not present

## 2019-04-28 DIAGNOSIS — Z79899 Other long term (current) drug therapy: Secondary | ICD-10-CM | POA: Diagnosis not present

## 2019-04-28 DIAGNOSIS — R202 Paresthesia of skin: Secondary | ICD-10-CM

## 2019-04-28 DIAGNOSIS — N183 Chronic kidney disease, stage 3 unspecified: Secondary | ICD-10-CM | POA: Insufficient documentation

## 2019-04-28 DIAGNOSIS — R000 Tachycardia, unspecified: Secondary | ICD-10-CM | POA: Diagnosis not present

## 2019-04-28 DIAGNOSIS — H538 Other visual disturbances: Secondary | ICD-10-CM | POA: Diagnosis not present

## 2019-04-28 DIAGNOSIS — Z85528 Personal history of other malignant neoplasm of kidney: Secondary | ICD-10-CM | POA: Diagnosis not present

## 2019-04-28 DIAGNOSIS — I480 Paroxysmal atrial fibrillation: Secondary | ICD-10-CM | POA: Insufficient documentation

## 2019-04-28 DIAGNOSIS — Z87891 Personal history of nicotine dependence: Secondary | ICD-10-CM | POA: Insufficient documentation

## 2019-04-28 DIAGNOSIS — I129 Hypertensive chronic kidney disease with stage 1 through stage 4 chronic kidney disease, or unspecified chronic kidney disease: Secondary | ICD-10-CM | POA: Insufficient documentation

## 2019-04-28 DIAGNOSIS — R1110 Vomiting, unspecified: Secondary | ICD-10-CM | POA: Insufficient documentation

## 2019-04-28 DIAGNOSIS — R531 Weakness: Secondary | ICD-10-CM | POA: Diagnosis not present

## 2019-04-28 DIAGNOSIS — R112 Nausea with vomiting, unspecified: Secondary | ICD-10-CM | POA: Diagnosis not present

## 2019-04-28 LAB — CBC WITH DIFFERENTIAL/PLATELET
Abs Immature Granulocytes: 0.05 10*3/uL (ref 0.00–0.07)
Basophils Absolute: 0 10*3/uL (ref 0.0–0.1)
Basophils Relative: 0 %
Eosinophils Absolute: 0 10*3/uL (ref 0.0–0.5)
Eosinophils Relative: 0 %
HCT: 47.8 % (ref 39.0–52.0)
Hemoglobin: 15.9 g/dL (ref 13.0–17.0)
Immature Granulocytes: 1 %
Lymphocytes Relative: 4 %
Lymphs Abs: 0.4 10*3/uL — ABNORMAL LOW (ref 0.7–4.0)
MCH: 32.7 pg (ref 26.0–34.0)
MCHC: 33.3 g/dL (ref 30.0–36.0)
MCV: 98.4 fL (ref 80.0–100.0)
Monocytes Absolute: 0.4 10*3/uL (ref 0.1–1.0)
Monocytes Relative: 4 %
Neutro Abs: 8.4 10*3/uL — ABNORMAL HIGH (ref 1.7–7.7)
Neutrophils Relative %: 91 %
Platelets: 150 10*3/uL (ref 150–400)
RBC: 4.86 MIL/uL (ref 4.22–5.81)
RDW: 12.5 % (ref 11.5–15.5)
WBC: 9.3 10*3/uL (ref 4.0–10.5)
nRBC: 0 % (ref 0.0–0.2)

## 2019-04-28 LAB — URINALYSIS, ROUTINE W REFLEX MICROSCOPIC
Bilirubin Urine: NEGATIVE
Glucose, UA: NEGATIVE mg/dL
Hgb urine dipstick: NEGATIVE
Ketones, ur: 15 mg/dL — AB
Leukocytes,Ua: NEGATIVE
Nitrite: NEGATIVE
Protein, ur: NEGATIVE mg/dL
Specific Gravity, Urine: 1.02 (ref 1.005–1.030)
pH: 6.5 (ref 5.0–8.0)

## 2019-04-28 LAB — TROPONIN I (HIGH SENSITIVITY): Troponin I (High Sensitivity): 15 ng/L (ref <18)

## 2019-04-28 LAB — COMPREHENSIVE METABOLIC PANEL
ALT: 23 U/L (ref 0–44)
AST: 22 U/L (ref 15–41)
Albumin: 4.8 g/dL (ref 3.5–5.0)
Alkaline Phosphatase: 58 U/L (ref 38–126)
Anion gap: 10 (ref 5–15)
BUN: 26 mg/dL — ABNORMAL HIGH (ref 8–23)
CO2: 25 mmol/L (ref 22–32)
Calcium: 9.6 mg/dL (ref 8.9–10.3)
Chloride: 103 mmol/L (ref 98–111)
Creatinine, Ser: 1.75 mg/dL — ABNORMAL HIGH (ref 0.61–1.24)
GFR calc Af Amer: 41 mL/min — ABNORMAL LOW (ref >60)
GFR calc non Af Amer: 36 mL/min — ABNORMAL LOW (ref >60)
Glucose, Bld: 99 mg/dL (ref 70–99)
Potassium: 3.8 mmol/L (ref 3.5–5.1)
Sodium: 138 mmol/L (ref 135–145)
Total Bilirubin: 0.7 mg/dL (ref 0.3–1.2)
Total Protein: 7.5 g/dL (ref 6.5–8.1)

## 2019-04-28 LAB — LIPASE, BLOOD: Lipase: 41 U/L (ref 11–51)

## 2019-04-28 NOTE — ED Triage Notes (Signed)
Blurry vision, BUE numbness lasted for 40 minutes.  Symptoms resolved.  Family wants him to be evaluated.

## 2019-04-28 NOTE — Telephone Encounter (Signed)
Patients daughter called and states that her mother reports that this am her dad was sitting at the table and became weak,dizzy, vomited had blurred vision. He reports tingling and numb feeling both extremities rt and left. She states that his BP at the time was 186/88. He mother called her in to evaluate.  Jeffrey Hardy rechecked his BP and it is now 144/84. She states he dad is just tired now but still report numbness/tinglings to his legs and vision is still blurry. She states his smile is even and speech is normal. Per protocol she will have her father transferred to ER for evaluation of symptoms she states she is calling 911 for transport. Note will be routed to Dr Jenny Reichmann.  Reason for Disposition . [1] Weakness (i.e., paralysis, loss of muscle strength) of the face, arm / hand, or leg / foot on one side of the body AND [2] sudden onset AND [3] brief (now gone)  Answer Assessment - Initial Assessment Questions 1. SYMPTOM: "What is the main symptom you are concerned about?" (e.g., weakness, numbness)     Vision blurred dizziness numbness in arms vomiting 2. ONSET: "When did this start?" (minutes, hours, days; while sleeping)    This AM 3. LAST NORMAL: "When was the last time you were normal (no symptoms)?"     now 4. PATTERN "Does this come and go, or has it been constant since it started?"  "Is it present now?"     No symptoms now 5. CARDIAC SYMPTOMS: "Have you had any of the following symptoms: chest pain, difficulty breathing, palpitations?"     no 6. NEUROLOGIC SYMPTOMS: "Have you had any of the following symptoms: headache, dizziness, vision loss, double vision, changes in speech, unsteady on your feet?"   Vision loss double 7. OTHER SYMPTOMS: "Do you have any other symptoms?"     vomiting 8. PREGNANCY: "Is there any chance you are pregnant?" "When was your last menstrual period?"    N/A  Protocols used: NEUROLOGIC DEFICIT-A-AH

## 2019-04-28 NOTE — ED Provider Notes (Signed)
[facility] EMERGENCY DEPARTMENT Provider Note   CSN: [id number] Arrival date & time: 04/28/19  1230     History Chief Complaint  Patient presents with  . Blurry vision, BUE numbness    Jeffrey Hardy is a 84 y.o. male.  HPI Patient had eaten this morning.  He had stewed apples and toast.  Within about 5 minutes of eating he started vomiting and had 4 sequential episodes of vomiting.  After that he felt that his hands were both kind of tingly and vision was slightly blurred.  Those symptoms seemed of lasted about 40 minutes.  The patient's wife was home and called her daughter who is a Marine scientist.  She is with the patient at this time.  She reports she did stroke exam and there were no focal deficits.  Patient has since returned to normal and has no complaints.  Does take Eliquis daily.  He has not recently been sick with vomiting illness diarrhea cough fever or myalgias.  He no longer endorses any nausea or abdominal pain.    Past Medical History:  Diagnosis Date  . Anxiety   . Bilateral renal cysts   . Cancer ([place])    renal mass  . Dehydration 08/2018  . Depression   . Diverticulosis of colon   . Erectile dysfunction   . Hematuria   . History of cerebral embolic infarction x2 CVA's ---  neurologist-  [name]--- per pt residual abnormal gait , does need cane   12-16-2011  right frontal embolic infarct and [id number] left frontal embolic infarct (per first MRI previous hemorrgaic cerebral ischemia)  . History of duodenal ulcer    1997  . History of Helicobacter pylori infection    1997  . History of hiatal hernia   . History of loop recorder    Current  . History of syncope    1997  and 2014  s/p  loop recorder  . Hyperlipidemia   . Hypertension   . Nocturia   . PAF (paroxysmal atrial fibrillation) [facility])    cardiologist-  dr [name]  . Peripheral neuropathy 05/04/2018  . Renal cell cancer ([place]) 09/06/2016  . Right renal mass   . Status post placement of implantable  loop recorder    07-06-2012  . Stroke [facility])    2013 2 mild strokes mild weakness bil sides  . Urgency of urination   . Wears hearing aid    right ear only    Patient Active Problem List   Diagnosis Date Noted  . 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 Falls frequently 08/28/2018  . Gait disorder 08/24/2018  . Insomnia 08/24/2018  . Peripheral neuropathy 05/04/2018  . Syncope 02/06/2018  . Closed displaced fracture of right femoral neck ([place]) 02/01/2018  . PAF (paroxysmal atrial fibrillation) ([place]) 01/30/2018  . Closed right hip fracture, initial encounter ([place]) 01/29/2018  . CKD (chronic kidney disease) stage 3, GFR 30-59 ml/min 09/04/2017  . Hip fracture ([place]) 09/04/2017  . Renal cell cancer ([place]) 09/06/2016  . Urinary frequency 03/08/2016  . Dementia without behavioral disturbance ([place]) 03/08/2016  . History of colonic polyps 07/07/2015  . Hyperglycemia 07/04/2015  . Cerebrovascular disease, arteriosclerotic, post-stroke 12/28/2013  . Shingles 06/29/2013  . Incontinent of urine 12/29/2012  . Thrombocytopenia ([place]) 01/13/2012  . 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 Preventative health care 07/27/2010  . Hyperlipidemia 05/12/2008  . Essential hypertension 05/12/2008  . GERD 05/12/2008  . DIVERTICULOSIS, COLON 05/12/2008    Past Surgical History:  Procedure Laterality Date  . APPENDECTOMY  age 57  . COLONOSCOPY  last one 07-17-2015  . CYSTOSCOPY/RETROGRADE/URETEROSCOPY Right 04/22/2016   Procedure: CYSTOSCOPY/RETROGRADE/URETEROSCOPY/ BIOPSY/ STENT PLACEMENT;  Surgeon: [name], MD;  [facility];  Service: Urology;  Laterality: Right;  . ESOPHAGOGASTRODUODENOSCOPY  last one 09-16-2014  . LOOP RECORDER IMPLANT N/A 07/06/2012   Procedure: LOOP RECORDER IMPLANT;  Surgeon: [name], MD;  [facility] CATH LAB;  Service: Cardiovascular;  Laterality: N/A;  . ROBOT ASSITED LAPAROSCOPIC NEPHROURETERECTOMY Right 05/13/2016   Procedure: XI ROBOT  ASSITED LAPAROSCOPIC NEPHROURETERECTOMY;  Surgeon: [name], MD;  Location: [facility];  Service: Urology;  Laterality: Right;  . TEE WITHOUT CARDIOVERSION  12/19/2011   Procedure: TRANSESOPHAGEAL ECHOCARDIOGRAM (TEE);  Surgeon: [name], MD;  Location: [facility] ENDOSCOPY;  Service: Cardiovascular;  Laterality: N/A;  no evidence thrombus in the atrial cavity or appendage;  mild AV thickened w/ mild AI;  minimal fixed plaque in thoracic aorta;  mild MV thickened w/ mild MR;  NO pfo by color doppler or with injection of agitated saline  . TOTAL HIP ARTHROPLASTY Left 09/07/2017   Procedure: TOTAL HIP ARTHROPLASTY ANTERIOR APPROACH;  Surgeon: Rod Can, MD;  Location: [facility];  Service: Orthopedics;  Laterality: Left;  . TOTAL HIP ARTHROPLASTY Right 02/01/2018   Procedure: TOTAL HIP ARTHROPLASTY ANTERIOR APPROACH;  Surgeon: Rod Can, MD;  Location: [facility];  Service: Orthopedics;  Laterality: Right;  . TRANSTHORACIC ECHOCARDIOGRAM  09-10-20103   grade 1 diastolic dysfunction,  ef 55-60%/  AV sclerosis without stenosis/  trivial TR  . WRIST FRACTURE SURGERY Right 2014  approx.   retained hardware       Family History  Problem Relation Age of Onset  . Heart Problems Mother   . Brain cancer Father   . Parkinsonism Brother   . Dementia Brother   . Colon cancer Neg Hx   . Esophageal cancer Neg Hx   . Stomach cancer Neg Hx   . Gastric cancer Neg Hx   . Liver disease Neg Hx   . Kidney disease Neg Hx   . Diabetes Neg Hx     Social History   Tobacco Use  . Smoking status: Former Smoker    Years: 5.00    Types: Cigarettes, Pipe    Quit date: 09/07/1966    Years since quitting: 52.6  . Smokeless tobacco: Never Used  Substance Use Topics  . Alcohol use: No    Alcohol/week: 0.0 standard drinks  . Drug use: No    Home Medications Prior to Admission medications   Medication Sig Start Date End Date Taking? Authorizing Provider  B Complex Vitamins (VITAMIN B COMPLEX PO) Take 1  tablet by mouth daily. "high powered"   Yes [provider]  Cholecalciferol (VITAMIN D3) 5000 units CAPS Take 5,000 Units by mouth daily.    Yes [provider]  ELIQUIS 2.5 MG TABS tablet Take 1 tablet (2.5 mg total) by mouth 2 (two) times daily. 01/01/19  Yes Biagio Borg, MD  ferrous sulfate 325 (65 FE) MG tablet Take 325 mg by mouth daily with breakfast.   Yes [provider]  tamsulosin (FLOMAX) 0.4 MG CAPS capsule Take 1 capsule (0.4 mg total) by mouth daily after breakfast. 09/06/16  Yes Biagio Borg, MD  polyethylene glycol powder (GLYCOLAX/MIRALAX) 17 GM/SCOOP powder Take 17 g by mouth 2 (two) times daily as needed. 09/10/18   Biagio Borg, MD    Allergies  Acyclovir and related  Review of Systems   Review of Systems 10 Systems reviewed and are negative for acute change except as noted in the HPI.  Physical Exam Updated Vital Signs BP 135/86 (BP Location: Right Arm)   Pulse 78   Temp 98.3 F (36.8 C) (Rectal)   Resp 18   SpO2 99%   Physical Exam Constitutional:      Appearance: He is well-developed.  HENT:     Head: Normocephalic and atraumatic.  Eyes:     Extraocular Movements: Extraocular movements intact.     Conjunctiva/sclera: Conjunctivae normal.     Pupils: Pupils are equal, round, and reactive to light.  Cardiovascular:     Rate and Rhythm: Normal rate and regular rhythm.     Heart sounds: Normal heart sounds.  Pulmonary:     Effort: Pulmonary effort is normal.     Breath sounds: Normal breath sounds.  Abdominal:     General: Bowel sounds are normal. There is no distension.     Palpations: Abdomen is soft.     Tenderness: There is no abdominal tenderness.  Musculoskeletal:        General: Normal range of motion.     Cervical back: Neck supple.  Skin:    General: Skin is warm and dry.  Neurological:     General: No focal deficit present.     Mental Status: He is alert and oriented to person, place, and time.     GCS: GCS  eye subscore is 4. GCS verbal subscore is 5. GCS motor subscore is 6.     Cranial Nerves: No cranial nerve deficit.     Coordination: Coordination normal.     Comments: Motor strength 5\5x4 extremities.  Speech is clear.  Patient is situationally oriented in no evidence of aphasia.  Psychiatric:        Mood and Affect: Mood normal.     ED Results / Procedures / Treatments   Labs (all labs ordered are listed, but only abnormal results are displayed) Labs Reviewed  COMPREHENSIVE METABOLIC PANEL - Abnormal; Notable for the following components:      Result Value   BUN 26 (*)    Creatinine, Ser 1.75 (*)    GFR calc non Af Amer 36 (*)    GFR calc Af Amer 41 (*)    All other components within normal limits  CBC WITH DIFFERENTIAL/PLATELET - Abnormal; Notable for the following components:   Neutro Abs 8.4 (*)    Lymphs Abs 0.4 (*)    All other components within normal limits  LIPASE, BLOOD  URINALYSIS, ROUTINE W REFLEX MICROSCOPIC  TROPONIN I (HIGH SENSITIVITY)  TROPONIN I (HIGH SENSITIVITY)    EKG EKG Interpretation  Date/Time:  Wednesday April 28 2019 12:36:44 EST Ventricular Rate:  100 PR Interval:    QRS Duration: 93 QT Interval:  394 QTC Calculation: 423 R Axis:   69 Text Interpretation: Sinus tachycardia Ventricular bigeminy Consider left atrial enlargement multiple PVC, no acute ischemic changes Confirmed by Charlesetta Shanks [phone]) on 04/28/2019 12:43:06 PM   Radiology CT Head Wo Contrast  Result Date: 04/28/2019 CLINICAL DATA:  Blurry vision, bilateral upper extremity numbness now resolved EXAM: CT HEAD WITHOUT CONTRAST TECHNIQUE: Contiguous axial images were obtained from the base of the skull through the vertex without intravenous contrast. COMPARISON:  2019 FINDINGS: Brain: There is no acute intracranial hemorrhage, mass-effect, or edema. There is no new loss of gray-white differentiation. There is no extra-axial fluid collection. Multiple chronic  infarcts are  identified including involvement of bilateral frontal lobes, left parietal lobe, and left occipital lobe as well as the cerebellum bilaterally. Otherwise confluent areas of hypoattenuation in the supratentorial white matter are nonspecific but probably reflect advanced chronic microvascular ischemic changes. Prominence of the ventricles and sulci reflects stable parenchymal volume loss. Disproportionate ventricular prominence is likely due to central volume loss. Vascular: There is atherosclerotic calcification at the skull base. Skull: Calvarium is unremarkable. Sinuses/Orbits: No acute finding. Other: None. IMPRESSION: No acute intracranial hemorrhage, mass effect, or evidence of acute infarction. Stable chronic/nonemergent findings detailed above. Electronically Signed   By: Macy Mis M.D.   On: 04/28/2019 13:50    Procedures Procedures (including critical care time)  Medications Ordered in ED Medications - No data to display  ED Course  I have reviewed the triage vital signs and the nursing notes.  Pertinent labs & imaging results that were available during my care of the patient were reviewed by me and considered in my medical decision making (see chart for details).    MDM Rules/Calculators/A&P                      Patient is well in appearance with negative ROS.  His neurologic exam is intact with good strength x4 extremities.  Cognitive function is intact.  Has not been any recurrence of vomiting.  This was an isolated episode shortly after he ate.  Patient is on regular Eliquis  , CT scan obtained to make sure no intracranial bleeding had occurred.  Patient does not have severe headache.  After that episode of vomiting he had bilateral upper extremity numbness and tingling with some blurred vision.  This sounds atypical for TIA or stroke.  During the period of observation in the emergency department, patient's mental status has been alert and interactive without focal deficits.  At  this time I do feel he is stable for discharge with follow-up.  Return instructions provided. Final Clinical Impression(s) / ED Diagnoses Final diagnoses:  Vomiting in adult  Numbness and tingling of both upper extremities    Rx / DC Orders ED Discharge Orders    None       Charlesetta Shanks, MD 04/28/19 1535

## 2019-04-28 NOTE — Discharge Instructions (Addendum)
1.  Follow-up with your neurologist for recheck if needed. 2.  Return if you have any recurrent vomiting, severe headache, fever or other concerning symptoms.

## 2019-04-30 ENCOUNTER — Ambulatory Visit: Payer: PPO | Attending: Internal Medicine

## 2019-04-30 DIAGNOSIS — Z23 Encounter for immunization: Secondary | ICD-10-CM | POA: Insufficient documentation

## 2019-04-30 NOTE — Progress Notes (Signed)
   Covid-19 Vaccination Clinic  Name:  Jeffrey Hardy    MRN: [id number] DOB: [birth year]  04/30/2019  [name] was observed post Covid-19 immunization for 15 minutes without incidence. He was provided with Vaccine Information Sheet and instruction to access the V-Safe system.   [name] was instructed to call 911 with any severe reactions post vaccine: [name] Difficulty breathing  . Swelling of your face and throat  . A fast heartbeat  . A bad rash all over your body  . Dizziness and weakness    Immunizations Administered    Name Date Dose VIS Date Route   Pfizer COVID-19 Vaccine 04/30/2019  1:55 PM 0.3 mL 03/19/2019 Intramuscular   Manufacturer: [place]   Lot: [id number]   [place]: [id number]

## 2019-05-03 ENCOUNTER — Encounter: Payer: Self-pay | Admitting: Internal Medicine

## 2019-05-03 ENCOUNTER — Ambulatory Visit (INDEPENDENT_AMBULATORY_CARE_PROVIDER_SITE_OTHER): Payer: PPO | Admitting: Internal Medicine

## 2019-05-03 ENCOUNTER — Ambulatory Visit: Payer: PPO | Admitting: Internal Medicine

## 2019-05-03 DIAGNOSIS — R1110 Vomiting, unspecified: Secondary | ICD-10-CM | POA: Insufficient documentation

## 2019-05-03 DIAGNOSIS — R739 Hyperglycemia, unspecified: Secondary | ICD-10-CM | POA: Diagnosis not present

## 2019-05-03 DIAGNOSIS — N183 Chronic kidney disease, stage 3 unspecified: Secondary | ICD-10-CM

## 2019-05-03 DIAGNOSIS — F0390 Unspecified dementia without behavioral disturbance: Secondary | ICD-10-CM | POA: Diagnosis not present

## 2019-05-03 DIAGNOSIS — R202 Paresthesia of skin: Secondary | ICD-10-CM | POA: Insufficient documentation

## 2019-05-03 DIAGNOSIS — R112 Nausea with vomiting, unspecified: Secondary | ICD-10-CM

## 2019-05-03 NOTE — Assessment & Plan Note (Signed)
stable overall by history and exam, recent data reviewed with pt, and pt to continue medical treatment as before,  to f/u any worsening symptoms or concerns  

## 2019-05-03 NOTE — Assessment & Plan Note (Signed)
Etiology unclear, symptoms resolved, cont to follow

## 2019-05-03 NOTE — Progress Notes (Signed)
Patient ID: Jeffrey Hardy, male   DOB: 04/25/1935, 84 y.o.   MRN: [id number]  Virtual Visit via Video Note  I connected with Jeffrey Hardy on 05/03/19 at  4:00 PM EST by a video enabled telemedicine application and verified that I am speaking with the correct person using two identifiers.  Location: Patient: at home Provider: at office  I discussed the limitations of evaluation and management by telemedicine and the availability of in person appointments. The patient expressed understanding and agreed to proceed.  History of Present Illness: Here to f/u after recent ED visit, daughter is a stroke nurse and is convinced his recent bilat UE numbness was a TIA though numbness was bilateral and some numbness persistent though imaging neg for stroke, and symptoms only occurred after episode of N/V quickly resolved she also attributes to TIA.  Pt denies new neurological symptoms such as new headache, or facial or extremity weakness, Pt denies chest pain, increased sob or doe, wheezing, orthopnea, PND, increased LE swelling, palpitations, dizziness or syncope.   Pt denies polydipsia, polyuria.  No recent med addition such as asa added at ED visit.  Does also mention left trigger finger discomfort which she tx with volt gel otc prn.  Did also have covid vaccine #1 shot recenlty Past Medical History:  Diagnosis Date  . Anxiety   . Bilateral renal cysts   . Cancer ([place])    renal mass  . Dehydration 08/2018  . Depression   . Diverticulosis of colon   . Erectile dysfunction   . Hematuria   . History of cerebral embolic infarction x2 CVA's ---  neurologist-  [name]--- per pt residual abnormal gait , does need cane   12-16-2011  right frontal embolic infarct and 99991111 left frontal embolic infarct (per first MRI previous hemorrgaic cerebral ischemia)  . History of duodenal ulcer    1997  . History of Helicobacter pylori infection    1997  . History of hiatal hernia   . History of loop recorder     Current  . History of syncope    1997  and 2014  s/p  loop recorder  . Hyperlipidemia   . Hypertension   . Nocturia   . PAF (paroxysmal atrial fibrillation) [facility])    cardiologist-  dr [name]  . Peripheral neuropathy 05/04/2018  . Renal cell cancer ([place]) 09/06/2016  . Right renal mass   . Status post placement of implantable loop recorder    07-06-2012  . Stroke [facility])    2013 2 mild strokes mild weakness bil sides  . Urgency of urination   . Wears hearing aid    right ear only   Past Surgical History:  Procedure Laterality Date  . APPENDECTOMY  age 82  . COLONOSCOPY  last one 07-17-2015  . CYSTOSCOPY/RETROGRADE/URETEROSCOPY Right 04/22/2016   Procedure: CYSTOSCOPY/RETROGRADE/URETEROSCOPY/ BIOPSY/ STENT PLACEMENT;  Surgeon: [name], MD;  Location[facility];  Service: Urology;  Laterality: Right;  . ESOPHAGOGASTRODUODENOSCOPY  last one 09-16-2014  . LOOP RECORDER IMPLANT N/A 07/06/2012   Procedure: LOOP RECORDER IMPLANT;  Surgeon: [name], MD;  Location[facility] CATH LAB;  Service: Cardiovascular;  Laterality: N/A;  . ROBOT ASSITED LAPAROSCOPIC NEPHROURETERECTOMY Right 05/13/2016   Procedure: XI ROBOT ASSITED LAPAROSCOPIC NEPHROURETERECTOMY;  Surgeon: [name], MD;  Location: [facility];  Service: Urology;  Laterality: Right;  . TEE WITHOUT CARDIOVERSION  12/19/2011   Procedure: TRANSESOPHAGEAL ECHOCARDIOGRAM (TEE);  Surgeon: [name], MD;  Location: [facility] ENDOSCOPY;  Service: Cardiovascular;  Laterality: N/A;  no evidence thrombus in the atrial cavity or appendage;  mild AV thickened w/ mild AI;  minimal fixed plaque in thoracic aorta;  mild MV thickened w/ mild MR;  NO pfo by color doppler or with injection of agitated saline  . TOTAL HIP ARTHROPLASTY Left 09/07/2017   Procedure: TOTAL HIP ARTHROPLASTY ANTERIOR APPROACH;  Surgeon: Rod Can, MD;  Location: [facility];  Service: Orthopedics;  Laterality: Left;  . TOTAL HIP ARTHROPLASTY Right 02/01/2018    Procedure: TOTAL HIP ARTHROPLASTY ANTERIOR APPROACH;  Surgeon: Rod Can, MD;  Location: [place];  Service: Orthopedics;  Laterality: Right;  . TRANSTHORACIC ECHOCARDIOGRAM  09-10-20103   grade 1 diastolic dysfunction,  ef 55-60%/  AV sclerosis without stenosis/  trivial TR  . WRIST FRACTURE SURGERY Right 2014  approx.   retained hardware    reports that he quit smoking about 52 years ago. His smoking use included cigarettes and pipe. He quit after 5.00 years of use. He has never used smokeless tobacco. He reports that he does not drink alcohol or use drugs. family history includes Brain cancer in his father; Dementia in his brother; Heart Problems in his mother; Parkinsonism in his brother. Allergies  Allergen Reactions  . Acyclovir And Related Other (See Comments)    Unsure of what happens   Current Outpatient Medications on File Prior to Visit  Medication Sig Dispense Refill  . B Complex Vitamins (VITAMIN B COMPLEX PO) Take 1 tablet by mouth daily. "high powered"    . Cholecalciferol (VITAMIN D3) 5000 units CAPS Take 5,000 Units by mouth daily.     [name] ELIQUIS 2.5 MG TABS tablet Take 1 tablet (2.5 mg total) by mouth 2 (two) times daily. 180 tablet 3  . ferrous sulfate 325 (65 FE) MG tablet Take 325 mg by mouth daily with breakfast.    . polyethylene glycol powder (GLYCOLAX/MIRALAX) 17 GM/SCOOP powder Take 17 g by mouth 2 (two) times daily as needed. 3350 g 1  . tamsulosin (FLOMAX) 0.4 MG CAPS capsule Take 1 capsule (0.4 mg total) by mouth daily after breakfast. 90 capsule 3   No current facility-administered medications on file prior to visit.    Observations/Objective: Alert, NAD, appropriate mood and affect, resps normal, cn 2-12 intact, moves all 4s, no visible rash or swelling Lab Results  Component Value Date   WBC 9.3 04/28/2019   HGB 15.9 04/28/2019   HCT 47.8 04/28/2019   PLT 150 04/28/2019   GLUCOSE 99 04/28/2019   CHOL 142 06/02/2018   TRIG 97.0 06/02/2018   HDL  35.80 (L) 06/02/2018   LDLCALC 87 06/02/2018   ALT 23 04/28/2019   AST 22 04/28/2019   NA 138 04/28/2019   K 3.8 04/28/2019   CL 103 04/28/2019   CREATININE 1.75 (H) 04/28/2019   BUN 26 (H) 04/28/2019   CO2 25 04/28/2019   TSH 2.30 06/02/2018   PSA 0.90 11/03/2017   INR 1.19 01/29/2018   HGBA1C 5.6 06/02/2018   Assessment and Plan See notes  Follow Up Instructions: See notes   I discussed the assessment and treatment plan with the patient. The patient was provided an opportunity to ask questions and all were answered. The patient agreed with the plan and demonstrated an understanding of the instructions.   The patient was advised to call back or seek an in-person evaluation if the symptoms worsen or if the condition fails to improve as anticipated.  Cathlean Cower, MD

## 2019-05-03 NOTE — Patient Instructions (Signed)
Please continue all other medications as before, and refills have been done if requested.  Please have the pharmacy call with any other refills you may need.  Please continue your efforts at being more active, low cholesterol diet, and weight control.  Please keep your appointments with your specialists as you may have planned     

## 2019-05-03 NOTE — Assessment & Plan Note (Addendum)
Though daughter is convinced, I find hx very unlikely to be c/w TIA, more likely related to N/V vs other, cont same tx  I spent 31 minutes preparing to see the patient by review of recent labs, imaging and procedures, obtaining and reviewing separately obtained history, communicating with the patient and family or caregiver, ordering medications, tests or procedures, and documenting clinical information in the EHR including the differential Dx, treatment, and any further evaluation and other management of paresthesias, Dementia, hyerpglycemia, CKD, vomiting

## 2019-05-21 ENCOUNTER — Ambulatory Visit: Payer: PPO | Attending: Internal Medicine

## 2019-05-21 DIAGNOSIS — Z23 Encounter for immunization: Secondary | ICD-10-CM | POA: Insufficient documentation

## 2019-05-21 NOTE — Progress Notes (Signed)
   Covid-19 Vaccination Clinic  Name:  Jeffrey Hardy    MRN: [id number] DOB: July 05, 1935  05/21/2019  [name] was observed post Covid-19 immunization for 15 minutes without incidence. He was provided with Vaccine Information Sheet and instruction to access the V-Safe system.   [name] was instructed to call 911 with any severe reactions post vaccine: [name] Difficulty breathing  . Swelling of your face and throat  . A fast heartbeat  . A bad rash all over your body  . Dizziness and weakness    Immunizations Administered    Name Date Dose VIS Date Route   Pfizer COVID-19 Vaccine 05/21/2019  2:00 PM 0.3 mL 03/19/2019 Intramuscular   Manufacturer: [place]   Lot: [id number]   [place]: [id number]

## 2019-06-10 DIAGNOSIS — L905 Scar conditions and fibrosis of skin: Secondary | ICD-10-CM | POA: Diagnosis not present

## 2019-06-10 DIAGNOSIS — L298 Other pruritus: Secondary | ICD-10-CM | POA: Diagnosis not present

## 2019-06-10 DIAGNOSIS — L570 Actinic keratosis: Secondary | ICD-10-CM | POA: Diagnosis not present

## 2019-06-10 DIAGNOSIS — D692 Other nonthrombocytopenic purpura: Secondary | ICD-10-CM | POA: Diagnosis not present

## 2019-06-24 ENCOUNTER — Other Ambulatory Visit (INDEPENDENT_AMBULATORY_CARE_PROVIDER_SITE_OTHER): Payer: PPO

## 2019-06-24 DIAGNOSIS — Z0000 Encounter for general adult medical examination without abnormal findings: Secondary | ICD-10-CM | POA: Diagnosis not present

## 2019-06-24 DIAGNOSIS — E559 Vitamin D deficiency, unspecified: Secondary | ICD-10-CM | POA: Diagnosis not present

## 2019-06-24 DIAGNOSIS — R739 Hyperglycemia, unspecified: Secondary | ICD-10-CM

## 2019-06-24 LAB — CBC WITH DIFFERENTIAL/PLATELET
Basophils Absolute: 0.1 10*3/uL (ref 0.0–0.1)
Basophils Relative: 1.1 % (ref 0.0–3.0)
Eosinophils Absolute: 0.1 10*3/uL (ref 0.0–0.7)
Eosinophils Relative: 1.7 % (ref 0.0–5.0)
HCT: 45.5 % (ref 39.0–52.0)
Hemoglobin: 15.1 g/dL (ref 13.0–17.0)
Lymphocytes Relative: 19.7 % (ref 12.0–46.0)
Lymphs Abs: 1 10*3/uL (ref 0.7–4.0)
MCHC: 33.3 g/dL (ref 30.0–36.0)
MCV: 99.3 fl (ref 78.0–100.0)
Monocytes Absolute: 0.4 10*3/uL (ref 0.1–1.0)
Monocytes Relative: 8 % (ref 3.0–12.0)
Neutro Abs: 3.6 10*3/uL (ref 1.4–7.7)
Neutrophils Relative %: 69.5 % (ref 43.0–77.0)
Platelets: 137 10*3/uL — ABNORMAL LOW (ref 150.0–400.0)
RBC: 4.58 Mil/uL (ref 4.22–5.81)
RDW: 13.1 % (ref 11.5–15.5)
WBC: 5.2 10*3/uL (ref 4.0–10.5)

## 2019-06-24 LAB — LIPID PANEL
Cholesterol: 133 mg/dL (ref 0–200)
HDL: 30.2 mg/dL — ABNORMAL LOW (ref >39.00)
LDL Cholesterol: 73 mg/dL (ref 0–99)
NonHDL: 102.42
Total CHOL/HDL Ratio: 4
Triglycerides: 146 mg/dL (ref 0.0–149.0)
VLDL: 29.2 mg/dL (ref 0.0–40.0)

## 2019-06-24 LAB — HEPATIC FUNCTION PANEL
ALT: 17 U/L (ref 0–53)
AST: 15 U/L (ref 0–37)
Albumin: 4.1 g/dL (ref 3.5–5.2)
Alkaline Phosphatase: 59 U/L (ref 39–117)
Bilirubin, Direct: 0.1 mg/dL (ref 0.0–0.3)
Total Bilirubin: 0.6 mg/dL (ref 0.2–1.2)
Total Protein: 6.5 g/dL (ref 6.0–8.3)

## 2019-06-24 LAB — BASIC METABOLIC PANEL
BUN: 27 mg/dL — ABNORMAL HIGH (ref 6–23)
CO2: 30 mEq/L (ref 19–32)
Calcium: 9.4 mg/dL (ref 8.4–10.5)
Chloride: 103 mEq/L (ref 96–112)
Creatinine, Ser: 1.92 mg/dL — ABNORMAL HIGH (ref 0.40–1.50)
GFR: 33.76 mL/min — ABNORMAL LOW (ref >60.00)
Glucose, Bld: 114 mg/dL — ABNORMAL HIGH (ref 70–99)
Potassium: 4 mEq/L (ref 3.5–5.1)
Sodium: 138 mEq/L (ref 135–145)

## 2019-06-24 LAB — HEMOGLOBIN A1C: Hgb A1c MFr Bld: 5.3 % (ref 4.6–6.5)

## 2019-06-24 LAB — VITAMIN D 25 HYDROXY (VIT D DEFICIENCY, FRACTURES): VITD: 88.09 ng/mL (ref 30.00–100.00)

## 2019-06-24 LAB — TSH: TSH: 2.27 u[IU]/mL (ref 0.35–4.50)

## 2019-06-25 LAB — URINALYSIS, ROUTINE W REFLEX MICROSCOPIC
Bilirubin Urine: NEGATIVE
Hgb urine dipstick: NEGATIVE
Ketones, ur: NEGATIVE
Leukocytes,Ua: NEGATIVE
Nitrite: NEGATIVE
RBC / HPF: NONE SEEN (ref 0–?)
Specific Gravity, Urine: 1.02 (ref 1.000–1.030)
Total Protein, Urine: NEGATIVE
Urine Glucose: NEGATIVE
Urobilinogen, UA: 0.2 (ref 0.0–1.0)
pH: 6 (ref 5.0–8.0)

## 2019-06-30 ENCOUNTER — Encounter: Payer: Self-pay | Admitting: Internal Medicine

## 2019-06-30 ENCOUNTER — Telehealth: Payer: Self-pay | Admitting: Internal Medicine

## 2019-06-30 DIAGNOSIS — N3941 Urge incontinence: Secondary | ICD-10-CM | POA: Diagnosis not present

## 2019-06-30 DIAGNOSIS — N401 Enlarged prostate with lower urinary tract symptoms: Secondary | ICD-10-CM | POA: Diagnosis not present

## 2019-06-30 DIAGNOSIS — C641 Malignant neoplasm of right kidney, except renal pelvis: Secondary | ICD-10-CM | POA: Diagnosis not present

## 2019-06-30 NOTE — Telephone Encounter (Signed)
New message:   Manus Gunning calling from [facility] to get verbal orders for palliative care for the patient. Please advise.

## 2019-06-30 NOTE — Telephone Encounter (Signed)
Ok for verbals 

## 2019-06-30 NOTE — Telephone Encounter (Signed)
Okay to give verbal for palliative care?

## 2019-07-01 ENCOUNTER — Encounter: Payer: Self-pay | Admitting: Internal Medicine

## 2019-07-01 ENCOUNTER — Other Ambulatory Visit: Payer: Self-pay

## 2019-07-01 ENCOUNTER — Ambulatory Visit (INDEPENDENT_AMBULATORY_CARE_PROVIDER_SITE_OTHER): Payer: PPO | Admitting: Internal Medicine

## 2019-07-01 VITALS — BP 150/78 | HR 52 | Temp 97.9°F | Ht 73.0 in

## 2019-07-01 DIAGNOSIS — I10 Essential (primary) hypertension: Secondary | ICD-10-CM

## 2019-07-01 DIAGNOSIS — R001 Bradycardia, unspecified: Secondary | ICD-10-CM | POA: Diagnosis not present

## 2019-07-01 DIAGNOSIS — R739 Hyperglycemia, unspecified: Secondary | ICD-10-CM | POA: Diagnosis not present

## 2019-07-01 DIAGNOSIS — I498 Other specified cardiac arrhythmias: Secondary | ICD-10-CM | POA: Diagnosis not present

## 2019-07-01 NOTE — Assessment & Plan Note (Addendum)
ecg c/w bigeminy, also hx of afib, for cardiology referral  I spent 31 minutes in preparing to see the patient by review of recent labs, imaging and procedures, obtaining and reviewing separately obtained history, communicating with the patient and family or caregiver, ordering medications, tests or procedures, and documenting clinical information in the EHR including the differential Dx, treatment, and any further evaluation and other management of bigeminy with hx of afib, hTN, hyeprglycemia

## 2019-07-01 NOTE — Assessment & Plan Note (Signed)
stable overall by history and exam, recent data reviewed with pt, and pt to continue medical treatment as before,  to f/u any worsening symptoms or concerns  

## 2019-07-01 NOTE — Telephone Encounter (Signed)
Left detailed vm with Manus Gunning giving verbal okay as requested for palliative care.

## 2019-07-01 NOTE — Patient Instructions (Addendum)
Please continue all other medications as before, and refills have been done if requested.  Please have the pharmacy call with any other refills you may need.  Please continue your efforts at being more active, low cholesterol diet, and weight control.  You will be contacted regarding the referral for: Cardiology  Please keep your appointments with your specialists as you may have planned  Please make an Appointment to return in 3 months, or sooner if needed

## 2019-07-01 NOTE — Progress Notes (Signed)
Subjective:    Patient ID: Jeffrey Hardy, male    DOB: [birth year], 84 y.o.   MRN: [id number]  HPI  Here with wife and nurse relative who has oximetry and urology visit confirmation of HR in the 40-50's over the past 2 days; pt is asympt, Pt denies chest pain, increased sob or doe, wheezing, orthopnea, PND, increased LE swelling, palpitations, dizziness or syncope.  Pt denies new neurological symptoms such as new headache, or facial or extremity weakness or numbness   Pt denies polydipsia, polyuria Past Medical History:  Diagnosis Date  . Anxiety   . Bilateral renal cysts   . Cancer ([place])    renal mass  . Dehydration 08/2018  . Depression   . Diverticulosis of colon   . Erectile dysfunction   . Hematuria   . History of cerebral embolic infarction x2 CVA's ---  neurologist-  [name]--- per pt residual abnormal gait , does need cane   12-16-2011  right frontal embolic infarct and 99991111 left frontal embolic infarct (per first MRI previous hemorrgaic cerebral ischemia)  . History of duodenal ulcer    1997  . History of Helicobacter pylori infection    1997  . History of hiatal hernia   . History of loop recorder    Current  . History of syncope    1997  and 2014  s/p  loop recorder  . Hyperlipidemia   . Hypertension   . Nocturia   . PAF (paroxysmal atrial fibrillation) [facility])    cardiologist-  dr [name]  . Peripheral neuropathy 05/04/2018  . Renal cell cancer ([place]) 09/06/2016  . Right renal mass   . Status post placement of implantable loop recorder    07-06-2012  . Stroke [facility])    2013 2 mild strokes mild weakness bil sides  . Urgency of urination   . Wears hearing aid    right ear only   Past Surgical History:  Procedure Laterality Date  . APPENDECTOMY  age 61  . COLONOSCOPY  last one 07-17-2015  . CYSTOSCOPY/RETROGRADE/URETEROSCOPY Right 04/22/2016   Procedure: CYSTOSCOPY/RETROGRADE/URETEROSCOPY/ BIOPSY/ STENT PLACEMENT;  Surgeon: [name], MD;  Location:  [facility];  Service: Urology;  Laterality: Right;  . ESOPHAGOGASTRODUODENOSCOPY  last one 09-16-2014  . LOOP RECORDER IMPLANT N/A 07/06/2012   Procedure: LOOP RECORDER IMPLANT;  Surgeon: [name], MD;  Location: [facility] CATH LAB;  Service: Cardiovascular;  Laterality: N/A;  . ROBOT ASSITED LAPAROSCOPIC NEPHROURETERECTOMY Right 05/13/2016   Procedure: XI ROBOT ASSITED LAPAROSCOPIC NEPHROURETERECTOMY;  Surgeon: [name], MD;  Location: [facility];  Service: Urology;  Laterality: Right;  . TEE WITHOUT CARDIOVERSION  12/19/2011   Procedure: TRANSESOPHAGEAL ECHOCARDIOGRAM (TEE);  Surgeon: [name], MD;  Location: [facility] ENDOSCOPY;  Service: Cardiovascular;  Laterality: N/A;  no evidence thrombus in the atrial cavity or appendage;  mild AV thickened w/ mild AI;  minimal fixed plaque in thoracic aorta;  mild MV thickened w/ mild MR;  NO pfo by color doppler or with injection of agitated saline  . TOTAL HIP ARTHROPLASTY Left 09/07/2017   Procedure: TOTAL HIP ARTHROPLASTY ANTERIOR APPROACH;  Surgeon: Rod Can, MD;  Location: [facility];  Service: Orthopedics;  Laterality: Left;  . TOTAL HIP ARTHROPLASTY Right 02/01/2018   Procedure: TOTAL HIP ARTHROPLASTY ANTERIOR APPROACH;  Surgeon: Rod Can, MD;  Location: [place];  Service: Orthopedics;  Laterality: Right;  . TRANSTHORACIC ECHOCARDIOGRAM  09-10-20103   grade 1 diastolic dysfunction,  ef 55-60%/  AV  sclerosis without stenosis/  trivial TR  . WRIST FRACTURE SURGERY Right 2014  approx.   retained hardware    reports that he quit smoking about 52 years ago. His smoking use included cigarettes and pipe. He quit after 5.00 years of use. He has never used smokeless tobacco. He reports that he does not drink alcohol or use drugs. family history includes Brain cancer in his father; Dementia in his brother; Heart Problems in his mother; Parkinsonism in his brother. Allergies  Allergen Reactions  . Acyclovir And Related Other (See  Comments)    Unsure of what happens   Current Outpatient Medications on File Prior to Visit  Medication Sig Dispense Refill  . B Complex Vitamins (VITAMIN B COMPLEX PO) Take 1 tablet by mouth daily. "high powered"    . Cholecalciferol (VITAMIN D3) 5000 units CAPS Take 5,000 Units by mouth daily.     [name] ELIQUIS 2.5 MG TABS tablet Take 1 tablet (2.5 mg total) by mouth 2 (two) times daily. 180 tablet 3  . ferrous sulfate 325 (65 FE) MG tablet Take 325 mg by mouth daily with breakfast.    . polyethylene glycol powder (GLYCOLAX/MIRALAX) 17 GM/SCOOP powder Take 17 g by mouth 2 (two) times daily as needed. 3350 g 1  . tamsulosin (FLOMAX) 0.4 MG CAPS capsule Take 1 capsule (0.4 mg total) by mouth daily after breakfast. 90 capsule 3   No current facility-administered medications on file prior to visit.   Review of Systems All otherwise neg per pt     Objective:   Physical Exam BP (!) 150/78   Pulse (!) 52   Temp 97.9 F (36.6 C)   Ht 6\' 1"  (1.854 m)   SpO2 97%   BMI 26.62 kg/m   VS noted, not ill appearing, able for up on exam table with assist Constitutional: Pt appears in NAD HENT: Head: NCAT.  Right Ear: External ear normal.  Left Ear: External ear normal.  Eyes: . Pupils are equal, round, and reactive to light. Conjunctivae and EOM are normal Nose: without d/c or deformity Neck: Neck supple. Gross normal ROM Cardiovascular: Normal rate and regular rhythm.   Pulmonary/Chest: Effort normal and breath sounds without rales or wheezing.  Abd:  Soft, NT, ND, + BS, no organomegaly Neurological: Pt is alert. At baseline orientation, motor grossly intact Skin: Skin is warm. No rashes, other new lesions, no LE edema Psychiatric: Pt behavior is normal without agitation  All otherwise neg per pt Lab Results  Component Value Date   WBC 5.2 06/24/2019   HGB 15.1 06/24/2019   HCT 45.5 06/24/2019   PLT 137.0 (L) 06/24/2019   GLUCOSE 114 (H) 06/24/2019   CHOL 133 06/24/2019   TRIG 146.0  06/24/2019   HDL 30.20 (L) 06/24/2019   LDLCALC 73 06/24/2019   ALT 17 06/24/2019   AST 15 06/24/2019   NA 138 06/24/2019   K 4.0 06/24/2019   CL 103 06/24/2019   CREATININE 1.92 (H) 06/24/2019   BUN 27 (H) 06/24/2019   CO2 30 06/24/2019   TSH 2.27 06/24/2019   PSA 0.90 11/03/2017   INR 1.19 01/29/2018   HGBA1C 5.3 06/24/2019   ECG today I have personally interpreted;  SR with freq PVCs in bigeminy rate 79     Assessment & Plan:

## 2019-07-05 NOTE — Progress Notes (Signed)
PCP:  Biagio Borg, MD Primary Cardiologist: No primary care provider on file. Electrophysiologist: Dr. Silvestre Gunner is a 84 y.o. male with past medical history of paroxysmal afib and paroxysmal hypotension who presents today for routine electrophysiology followup. They are seen for Dr. [name].   Since last being seen in our clinic, the patient reports doing well overall. Recently seen by PCP and urology and noted to have "low HR" EKGs performed showed PVCs with bigeminy (As well as today). EKG in ED 04/2019 showed same, but NSR in 02/2019. Pt has been feeling the same. Denies chest pain, lightheadedness, dizziness, near syncope, syncope, or peripheral edema. He has not had orthostatic hypotension in some time.  Not on any rate slowing agents. He has vascular dementia, Daughter assists with much of his care. He ambulates around the house with a walker, uses a WC out in public usually.   The patient feels that he is tolerating medications without difficulties and is otherwise without complaint today.   Past Medical History:  Diagnosis Date  . Anxiety   . Bilateral renal cysts   . Cancer ([place])    renal mass  . Dehydration 08/2018  . Depression   . Diverticulosis of colon   . Erectile dysfunction   . Hematuria   . History of cerebral embolic infarction x2 CVA's ---  neurologist-  [name]--- per pt residual abnormal gait , does need cane   12-16-2011  right frontal embolic infarct and 99991111 left frontal embolic infarct (per first MRI previous hemorrgaic cerebral ischemia)  . History of duodenal ulcer    1997  . History of Helicobacter pylori infection    1997  . History of hiatal hernia   . History of loop recorder    Current  . History of syncope    1997  and 2014  s/p  loop recorder  . Hyperlipidemia   . Hypertension   . Nocturia   . PAF (paroxysmal atrial fibrillation) [facility])    cardiologist-  dr [name]  . Peripheral neuropathy 05/04/2018  . Renal cell cancer ([place])  09/06/2016  . Right renal mass   . Status post placement of implantable loop recorder    07-06-2012  . Stroke [facility])    2013 2 mild strokes mild weakness bil sides  . Urgency of urination   . Wears hearing aid    right ear only   Past Surgical History:  Procedure Laterality Date  . APPENDECTOMY  age 6  . COLONOSCOPY  last one 07-17-2015  . CYSTOSCOPY/RETROGRADE/URETEROSCOPY Right 04/22/2016   Procedure: CYSTOSCOPY/RETROGRADE/URETEROSCOPY/ BIOPSY/ STENT PLACEMENT;  Surgeon: [name], MD;  Location: [facility];  Service: Urology;  Laterality: Right;  . ESOPHAGOGASTRODUODENOSCOPY  last one 09-16-2014  . LOOP RECORDER IMPLANT N/A 07/06/2012   Procedure: LOOP RECORDER IMPLANT;  Surgeon: [name], MD;  Location[facility] CATH LAB;  Service: Cardiovascular;  Laterality: N/A;  . ROBOT ASSITED LAPAROSCOPIC NEPHROURETERECTOMY Right 05/13/2016   Procedure: XI ROBOT ASSITED LAPAROSCOPIC NEPHROURETERECTOMY;  Surgeon: [name], MD;  [facility];  Service: Urology;  Laterality: Right;  . TEE WITHOUT CARDIOVERSION  12/19/2011   Procedure: TRANSESOPHAGEAL ECHOCARDIOGRAM (TEE);  Surgeon: [name], MD;  Location: [facility] ENDOSCOPY;  Service: Cardiovascular;  Laterality: N/A;  no evidence thrombus in the atrial cavity or appendage;  mild AV thickened w/ mild AI;  minimal fixed plaque in thoracic aorta;  mild MV thickened w/ mild MR;  NO pfo by  color doppler or with injection of agitated saline  . TOTAL HIP ARTHROPLASTY Left 09/07/2017   Procedure: TOTAL HIP ARTHROPLASTY ANTERIOR APPROACH;  Surgeon: Rod Can, MD;  Location[facility];  Service: Orthopedics;  Laterality: Left;  . TOTAL HIP ARTHROPLASTY Right 02/01/2018   Procedure: TOTAL HIP ARTHROPLASTY ANTERIOR APPROACH;  Surgeon: Rod Can, MD;  Location: [place];  Service: Orthopedics;  Laterality: Right;  . TRANSTHORACIC ECHOCARDIOGRAM  09-10-20103   grade 1 diastolic dysfunction,  ef 55-60%/  AV sclerosis without  stenosis/  trivial TR  . WRIST FRACTURE SURGERY Right 2014  approx.   retained hardware    Current Outpatient Medications  Medication Sig Dispense Refill  . B Complex Vitamins (VITAMIN B COMPLEX PO) Take 1 tablet by mouth daily. "high powered"    . Cholecalciferol (VITAMIN D3) 5000 units CAPS Take 5,000 Units by mouth daily.     [name] ELIQUIS 2.5 MG TABS tablet Take 1 tablet (2.5 mg total) by mouth 2 (two) times daily. 180 tablet 3  . ferrous sulfate 325 (65 FE) MG tablet Take 325 mg by mouth daily with breakfast.    . polyethylene glycol powder (GLYCOLAX/MIRALAX) 17 GM/SCOOP powder Take 17 g by mouth 2 (two) times daily as needed. 3350 g 1  . tamsulosin (FLOMAX) 0.4 MG CAPS capsule Take 1 capsule (0.4 mg total) by mouth daily after breakfast. 90 capsule 3   No current facility-administered medications for this visit.    Allergies  Allergen Reactions  . Acyclovir And Related Other (See Comments)    Unsure of what happens    Social History   Socioeconomic History  . Marital status: Married    Spouse name: [name]   . Number of children: 2  . Years of education: Not on file  . Highest education level: Some college, no degree  Occupational History  . Occupation: retired  Tobacco Use  . Smoking status: Former Smoker    Years: 5.00    Types: Cigarettes, Pipe    Quit date: 09/07/1966    Years since quitting: 52.8  . Smokeless tobacco: Never Used  Substance and Sexual Activity  . Alcohol use: No    Alcohol/week: 0.0 standard drinks  . Drug use: No  . Sexual activity: Yes  Other Topics Concern  . 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 Patient is retired from [employer]   Left handed    2 cups of caffeine per day    Lives at home with wife    Social Determinants of Health   Financial Resource Strain:   . Difficulty of Paying Living Expenses:   Food Insecurity:   . Worried About Charity fundraiser in the Last Year:   . Arboriculturist in the  Last Year:   Transportation Needs:   . Film/video editor (Medical):   [name] Lack of Transportation (Non-Medical):   Physical Activity:   . Days of Exercise per Week:   . Minutes of Exercise per Session:   Stress:   . Feeling of Stress :   Social Connections:   . Frequency of Communication with Friends and Family:   . Frequency of Social Gatherings with Friends and Family:   . Attends Religious Services:   . Active Member of Clubs or Organizations:   . Attends Archivist Meetings:   [name] Marital Status:   Intimate Partner Violence:   . Fear of Current or Ex-Partner:   . Emotionally  Abused:   [name] Physically Abused:   . Sexually Abused:      Review of Systems: General: No chills, fever, night sweats or weight changes  Cardiovascular:  No chest pain, dyspnea on exertion, edema, orthopnea, palpitations, paroxysmal nocturnal dyspnea Dermatological: No rash, lesions or masses Respiratory: No cough, dyspnea Urologic: No hematuria, dysuria Abdominal: No nausea, vomiting, diarrhea, bright red blood per rectum, melena, or hematemesis Neurologic: No visual changes, weakness, changes in mental status All other systems reviewed and are otherwise negative except as noted above.  Physical Exam: Vitals:   07/06/19 0847  BP: 130/84  Pulse: 80  SpO2: 98%  Height: 6\' 1"  (1.854 m)    GEN- The patient is elderly appearing, alert and oriented x 3 today.   HEENT: normocephalic, atraumatic; sclera clear, conjunctiva pink; hearing intact; oropharynx clear; neck supple, no JVP Lymph- no cervical lymphadenopathy Lungs- Clear to ausculation bilaterally, normal work of breathing.  No wheezes, rales, rhonchi Heart- Regular rate and rhythm, no murmurs, rubs or gallops, PMI not laterally displaced GI- soft, non-tender, non-distended, bowel sounds present, no hepatosplenomegaly Extremities- no clubbing, cyanosis, or edema; DP/PT/radial pulses 2+ bilaterally MS- no significant deformity or  atrophy Skin- warm and dry, no rash or lesion Psych- euthymic mood, full affect Neuro- strength and sensation are intact  EKG is ordered. Personal review of EKG from today shows NSR at 80 bpm but with PVCs in a pattern of bigeminy.   Assessment and Plan:  1. Paroxysmal atrial fibrillation Asymptomatic Continue eliquis on appropriate dose due to age and creatinine for CHA2DS2VASC of at least 5    2. Orthostatic hypotension Stable No change today.   3. Bradycardia -> PVCs/Bigeminy EKG 07/01/2019 shows NSR at 79 bpm but with PVCs in a pattern of bigeminy. Same today.  Discussed with Dr. Curt Bears. With h/o hypotension and relatively immobility, will pursue watchful waiting for now.   4. Disposition Palliative care now involved at home.   Continue current medications and see back in 2 months to re-assess. Daughter knows to call back with any change in symptoms.   Shirley Friar, PA-C  07/06/19 9:06 AM

## 2019-07-06 ENCOUNTER — Other Ambulatory Visit: Payer: Self-pay

## 2019-07-06 ENCOUNTER — Ambulatory Visit (INDEPENDENT_AMBULATORY_CARE_PROVIDER_SITE_OTHER): Payer: PPO | Admitting: Student

## 2019-07-06 ENCOUNTER — Encounter: Payer: Self-pay | Admitting: Student

## 2019-07-06 VITALS — BP 130/84 | HR 80 | Ht 73.0 in

## 2019-07-06 DIAGNOSIS — R55 Syncope and collapse: Secondary | ICD-10-CM

## 2019-07-06 DIAGNOSIS — I10 Essential (primary) hypertension: Secondary | ICD-10-CM | POA: Diagnosis not present

## 2019-07-06 DIAGNOSIS — I480 Paroxysmal atrial fibrillation: Secondary | ICD-10-CM

## 2019-07-06 MED ORDER — ELIQUIS 2.5 MG PO TABS: 2.5000 mg | ORAL_TABLET | Freq: Two times a day (BID) | ORAL | 3 refills | Status: DC

## 2019-07-06 NOTE — Patient Instructions (Addendum)
Medication Instructions:  none *If you need a refill on your cardiac medications before your next appointment, please call your pharmacy*   Lab Work: none If you have labs (blood work) drawn today and your tests are completely normal, you will receive your results only by: [name] MyChart Message (if you have MyChart) OR . A paper copy in the mail If you have any lab test that is abnormal or we need to change your treatment, we will call you to review the results.   Testing/Procedures: none   Follow-Up: At [facility], you and your health needs are our priority.  As part of our continuing mission to provide you with exceptional heart care, we have created designated Provider Care Teams.  These Care Teams include your primary Cardiologist (physician) and Advanced Practice Providers (APPs -  Physician Assistants and Nurse Practitioners) who all work together to provide you with the care you need, when you need it.  Your next appointment:   2 months  The format for your next appointment:   In Person  Provider:   Oda Kilts, PA   Other Instructions

## 2019-07-14 DIAGNOSIS — R32 Unspecified urinary incontinence: Secondary | ICD-10-CM | POA: Diagnosis not present

## 2019-07-28 DIAGNOSIS — L84 Corns and callosities: Secondary | ICD-10-CM | POA: Diagnosis not present

## 2019-07-28 DIAGNOSIS — M79672 Pain in left foot: Secondary | ICD-10-CM | POA: Diagnosis not present

## 2019-07-28 DIAGNOSIS — M79671 Pain in right foot: Secondary | ICD-10-CM | POA: Diagnosis not present

## 2019-07-28 DIAGNOSIS — L602 Onychogryphosis: Secondary | ICD-10-CM | POA: Diagnosis not present

## 2019-08-09 DIAGNOSIS — R32 Unspecified urinary incontinence: Secondary | ICD-10-CM | POA: Diagnosis not present

## 2019-08-29 IMAGING — CT CT HEAD W/O CM
3 series · 15 of 47 positions shown, 18 images · non-contrast
Comparison: Brain MRI 01/04/2012, head CT 01/04/2012

CLINICAL DATA: Altered mental status.

EXAM:
CT HEAD WITHOUT CONTRAST
TECHNIQUE: Contiguous axial images were obtained from the base of the skull
through the vertex without intravenous contrast.

[Series 2: head wo · axial · 0.44mm/px · z∈[+1496,+1621]mm · 9 of 31 slices shown, 12 images]
[im 3/31  brain]
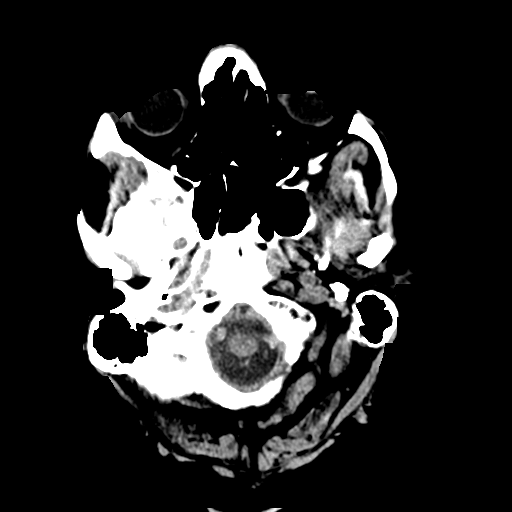
[im 3/31  bone]
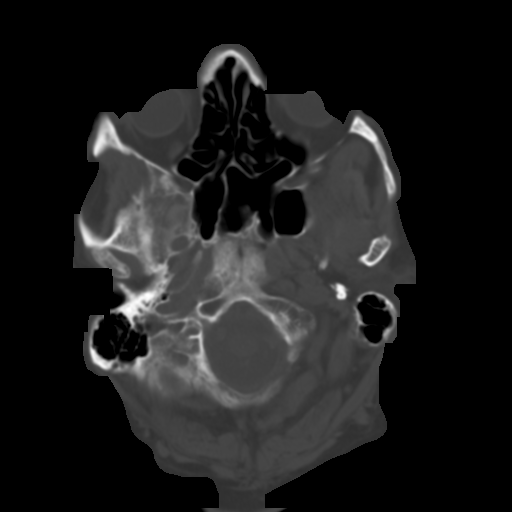
[im 6/31  brain]
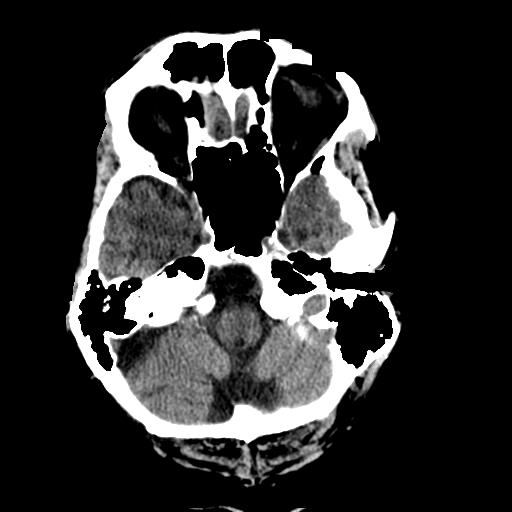
[im 9/31  brain]
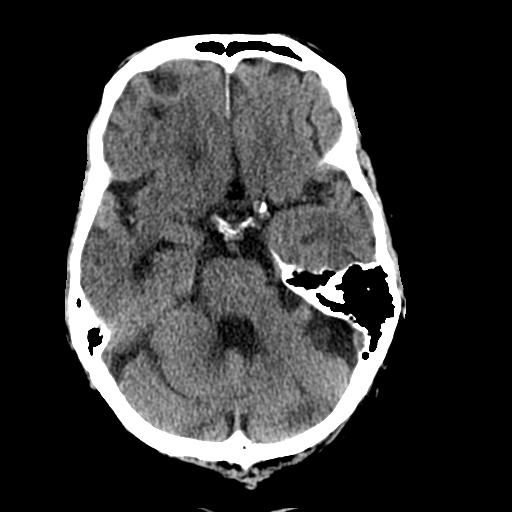
[im 12/31  brain]
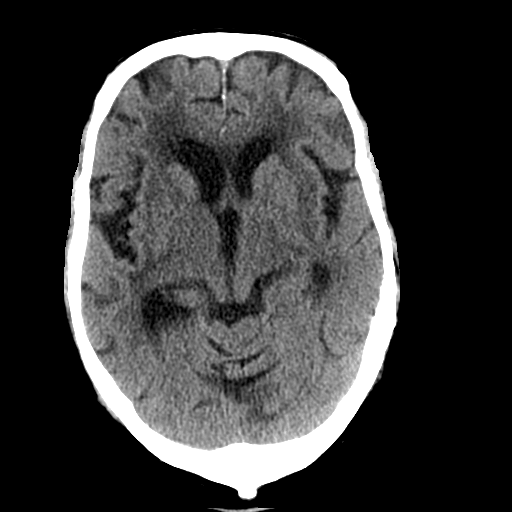
[im 16/31  brain]
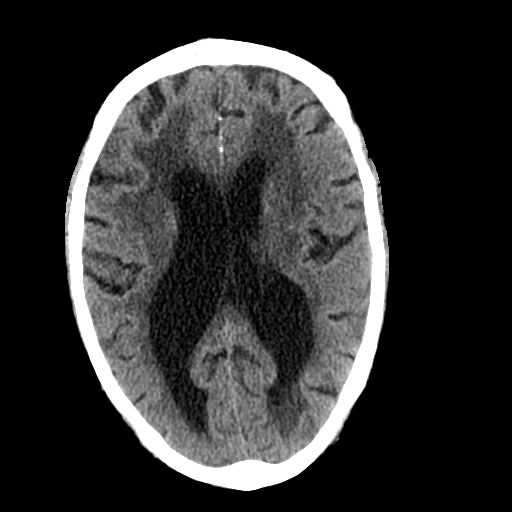
[im 16/31  bone]
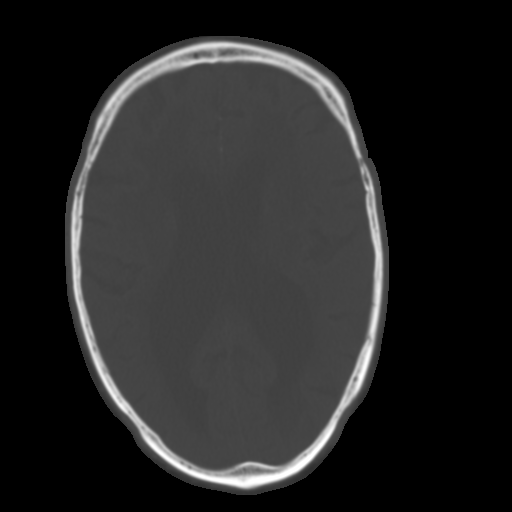
[im 19/31  brain]
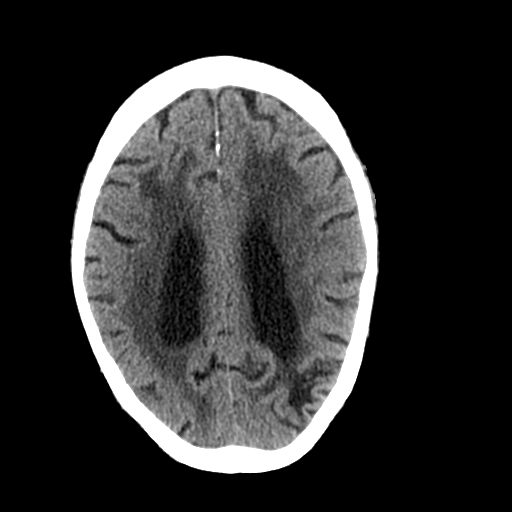
[im 22/31  brain]
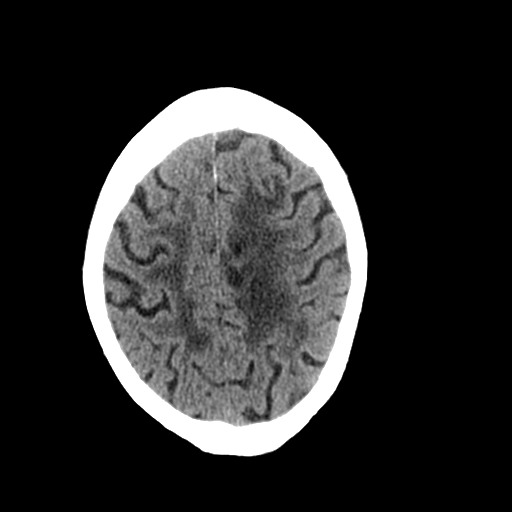
[im 25/31  brain]
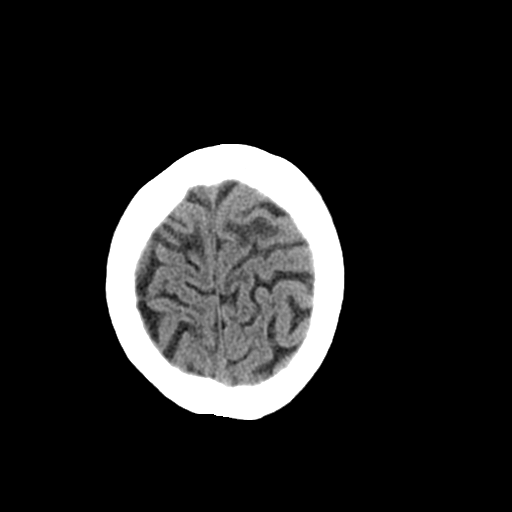
[im 28/31  brain]
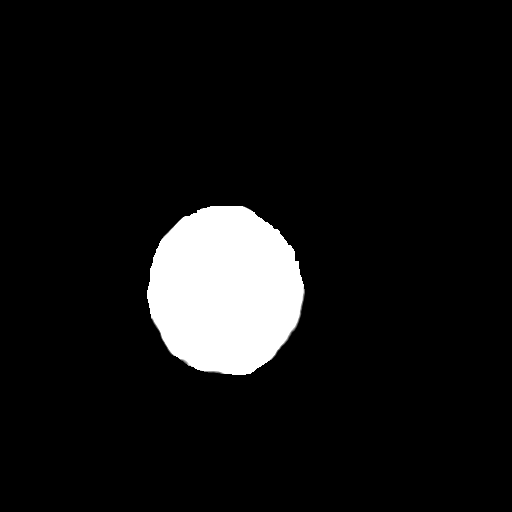
[im 28/31  bone]
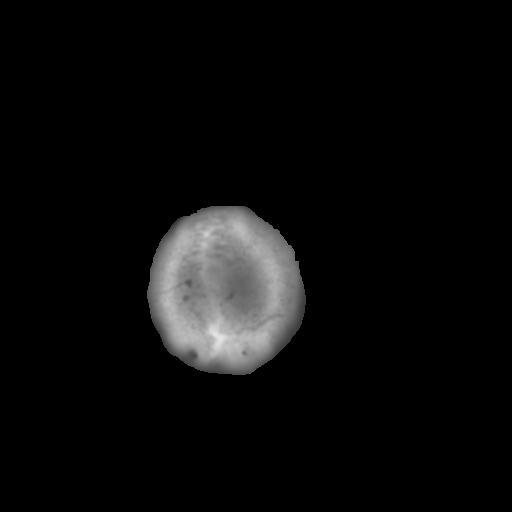

[Series 5: coronal soft tissue · coronal · 0.30mm/px · 3 of 70 slices shown]
[im 24/70  brain]
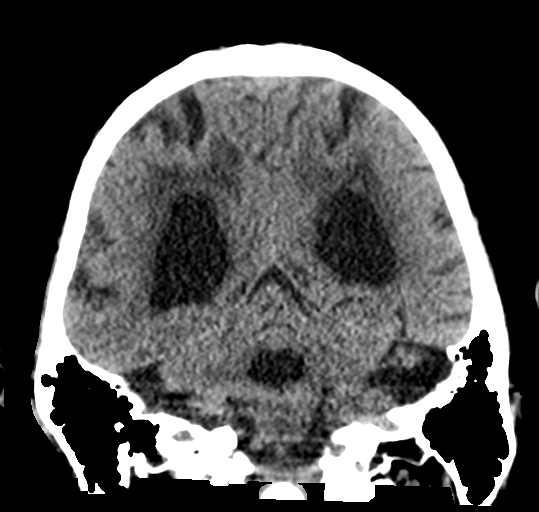
[im 31/70  brain]
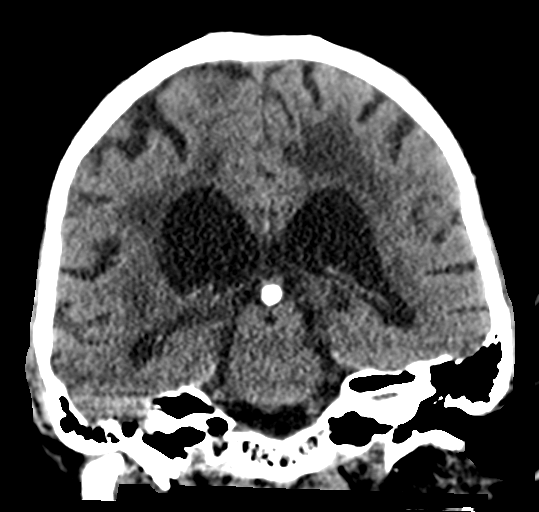
[im 39/70  brain]
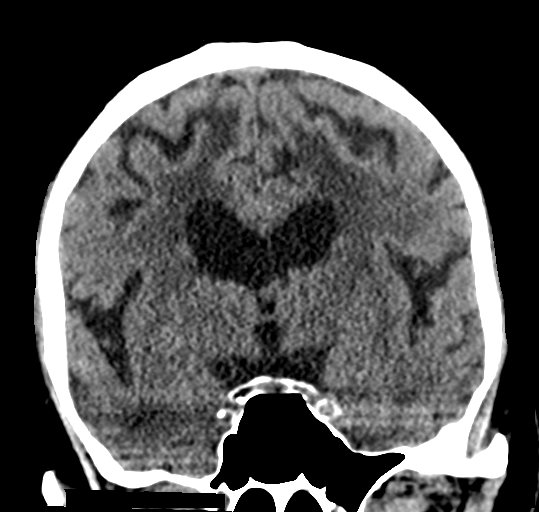

[Series 6: sagittal soft tissue · sagittal · 0.30mm/px · 3 of 55 slices shown]
[im 19/55  brain]
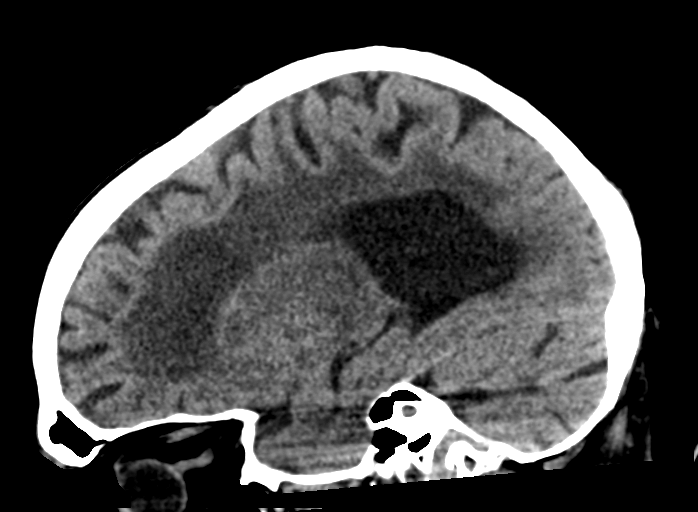
[im 28/55  brain]
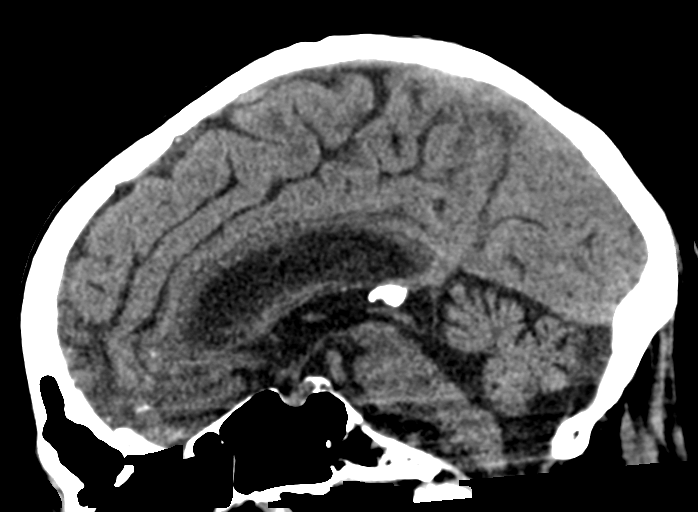
[im 37/55  brain]
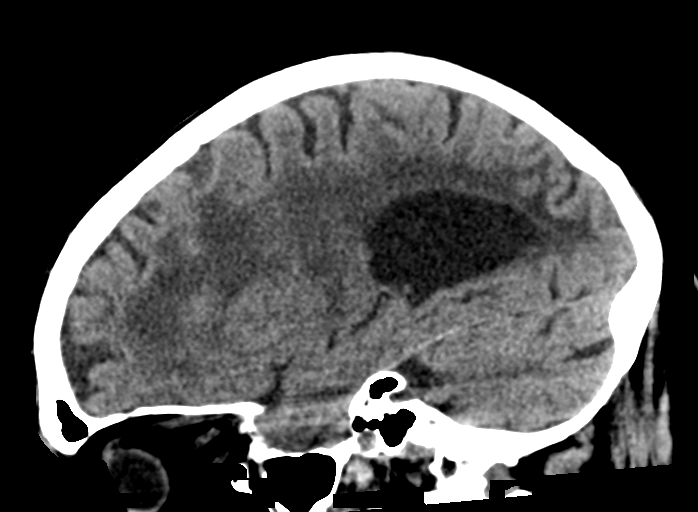

[15 of 47 positions shown; findings below may reference images not displayed]

FINDINGS: Brain: No evidence of acute infarction, hemorrhage, hydrocephalus,
extra-axial collection or mass lesion/mass effect. Areas of
encephalomalacia in bilateral frontal lobes, left parietal lobe and
left occipital lobe from prior ischemic infarctions. Marked brain
parenchymal volume loss and deep white matter microangiopathy.

Vascular: Calcific atherosclerotic disease at the skull base.

Skull: Normal. Negative for fracture or focal lesion.

Sinuses/Orbits: No acute finding.

Other: None.
IMPRESSION: Areas of encephalomalacia in bilateral frontal lobes, left parietal
lobe and left occipital lobe, from prior ischemic infarctions. No
definite acute infarction is seen.

No evidence of intracranial hemorrhage.

Marked brain parenchymal volume loss and deep white matter
microangiopathy.

## 2019-09-06 DIAGNOSIS — R001 Bradycardia, unspecified: Secondary | ICD-10-CM | POA: Insufficient documentation

## 2019-09-06 DIAGNOSIS — I951 Orthostatic hypotension: Secondary | ICD-10-CM | POA: Insufficient documentation

## 2019-09-07 ENCOUNTER — Ambulatory Visit: Payer: PPO | Admitting: Internal Medicine

## 2019-09-07 ENCOUNTER — Encounter: Payer: Self-pay | Admitting: Internal Medicine

## 2019-09-07 ENCOUNTER — Other Ambulatory Visit: Payer: Self-pay

## 2019-09-07 VITALS — BP 130/80 | HR 80 | Ht 73.0 in | Wt 199.0 lb

## 2019-09-07 DIAGNOSIS — I498 Other specified cardiac arrhythmias: Secondary | ICD-10-CM | POA: Diagnosis not present

## 2019-09-07 DIAGNOSIS — I951 Orthostatic hypotension: Secondary | ICD-10-CM

## 2019-09-07 DIAGNOSIS — R32 Unspecified urinary incontinence: Secondary | ICD-10-CM | POA: Diagnosis not present

## 2019-09-07 DIAGNOSIS — I480 Paroxysmal atrial fibrillation: Secondary | ICD-10-CM | POA: Diagnosis not present

## 2019-09-07 DIAGNOSIS — R001 Bradycardia, unspecified: Secondary | ICD-10-CM | POA: Diagnosis not present

## 2019-09-07 NOTE — Patient Instructions (Signed)
Medication Instructions:  Your physician recommends that you continue on your current medications as directed. Please refer to the Current Medication list given to you today. *If you need a refill on your cardiac medications before your next appointment, please call your pharmacy*   Lab Work: None ordered.  If you have labs (blood work) drawn today and your tests are completely normal, you will receive your results only by: . MyChart Message (if you have MyChart) OR . A paper copy in the mail If you have any lab test that is abnormal or we need to change your treatment, we will call you to review the results.   Testing/Procedures: None ordered.    Follow-Up: At [facility], you and your health needs are our priority.  As part of our continuing mission to provide you with exceptional heart care, we have created designated Provider Care Teams.  These Care Teams include your primary Cardiologist (physician) and Advanced Practice Providers (APPs -  Physician Assistants and Nurse Practitioners) who all work together to provide you with the care you need, when you need it.  We recommend signing up for the patient portal called "MyChart".  Sign up information is provided on this After Visit Summary.  MyChart is used to connect with patients for Virtual Visits (Telemedicine).  Patients are able to view lab/test results, encounter notes, upcoming appointments, etc.  Non-urgent messages can be sent to your provider as well.   To learn more about what you can do with MyChart, go to [email or web address].    Your next appointment:   As needed with Dr Klein  

## 2019-09-07 NOTE — Progress Notes (Signed)
Patient Care Team: Biagio Borg, MD as PCP - [name], MD as Consulting Physician (Orthopedic Surgery) Rod Can, MD as Consulting Physician (Orthopedic Surgery)   HPI  Jeffrey Hardy is a 84 y.o. male Seen in followup for a cryptogenic stroke for which he underwent loop recorder insertion 3/14; ERI and abandoned He has recovered fully.  His monitor demonstrated atrial fibrillation. He was seen by DC-NP and started on ELIQUIS. Aggrenox was discontinued. He was seen in follow-up tolerating his anticoagulation   He was hospitalized 11/19 following an episode of decreased responsiveness occurring in the week of antecedent closed hip fracture.  He underwent arthroplasty.  Some VT NS  Noted to have ventricular bigeminy at a urology appointment 1/21 good morning [name] the following detection of bradycardia   No attributable symptoms.  No lightheadedness.  Shortness of breath.  There is chronic mild asymmetric peripheral edema.  Progressive dementia.  He will not know he was here later this afternoon.  His daughter, a traveling nurse has moved home to help take care of him.  She has earned   Jeffrey Hardy's badge  DATE TEST EF   11/19 Echo   60-65 % Elevated E/E'           Date Cr K Hgb  7/19   13.3  11/19 1.74 4.3 8.6  3/21  1.92 4.0 15.1    Past Medical History:  Diagnosis Date  . Anxiety   . Bilateral renal cysts   . Cancer ([place])    renal mass  . Dehydration 08/2018  . Depression   . Diverticulosis of colon   . Erectile dysfunction   . Hematuria   . History of cerebral embolic infarction x2 CVA's ---  neurologist-  [name]--- per pt residual abnormal gait , does need cane   12-16-2011  right frontal embolic infarct and [id number] left frontal embolic infarct (per first MRI previous hemorrgaic cerebral ischemia)  . History of duodenal ulcer    1997  . History of Helicobacter pylori infection    1997  . History of hiatal hernia    . History of loop recorder    Current  . History of syncope    1997  and 2014  s/p  loop recorder  . Hyperlipidemia   . Hypertension   . Nocturia   . PAF (paroxysmal atrial fibrillation) [facility])    cardiologist-  dr [name]  . Peripheral neuropathy 05/04/2018  . Renal cell cancer ([place]) 09/06/2016  . Right renal mass   . Status post placement of implantable loop recorder    07-06-2012  . Stroke [facility])    2013 2 mild strokes mild weakness bil sides  . Urgency of urination   . Wears hearing aid    right ear only    Past Surgical History:  Procedure Laterality Date  . APPENDECTOMY  age 82  . COLONOSCOPY  last one 07-17-2015  . CYSTOSCOPY/RETROGRADE/URETEROSCOPY Right 04/22/2016   Procedure: CYSTOSCOPY/RETROGRADE/URETEROSCOPY/ BIOPSY/ STENT PLACEMENT;  Surgeon: [name], MD;  Location: [facility];  Service: Urology;  Laterality: Right;  . ESOPHAGOGASTRODUODENOSCOPY  last one 09-16-2014  . LOOP RECORDER IMPLANT N/A 07/06/2012   Procedure: LOOP RECORDER IMPLANT;  Surgeon: [name], MD;  Location: [facility] CATH LAB;  Service: Cardiovascular;  Laterality: N/A;  . ROBOT ASSITED LAPAROSCOPIC NEPHROURETERECTOMY Right 05/13/2016   Procedure: XI ROBOT ASSITED LAPAROSCOPIC NEPHROURETERECTOMY;  Surgeon: [name], MD;  Location: [facility];  Service: Urology;  Laterality: Right;  . TEE WITHOUT CARDIOVERSION  12/19/2011   Procedure: TRANSESOPHAGEAL ECHOCARDIOGRAM (TEE);  Surgeon: [name], MD;  Location: [facility] ENDOSCOPY;  Service: Cardiovascular;  Laterality: N/A;  no evidence thrombus in the atrial cavity or appendage;  mild AV thickened w/ mild AI;  minimal fixed plaque in thoracic aorta;  mild MV thickened w/ mild MR;  NO pfo by color doppler or with injection of agitated saline  . TOTAL HIP ARTHROPLASTY Left 09/07/2017   Procedure: TOTAL HIP ARTHROPLASTY ANTERIOR APPROACH;  Surgeon: Rod Can, MD;  Location: [facility];  Service: Orthopedics;  Laterality: Left;  . TOTAL  HIP ARTHROPLASTY Right 02/01/2018   Procedure: TOTAL HIP ARTHROPLASTY ANTERIOR APPROACH;  Surgeon: Rod Can, MD;  Location: [place];  Service: Orthopedics;  Laterality: Right;  . TRANSTHORACIC ECHOCARDIOGRAM  09-10-20103   grade 1 diastolic dysfunction,  ef 55-60%/  AV sclerosis without stenosis/  trivial TR  . WRIST FRACTURE SURGERY Right 2014  approx.   retained hardware    Current Outpatient Medications  Medication Sig Dispense Refill  . B Complex Vitamins (VITAMIN B COMPLEX PO) Take 1 tablet by mouth daily. "high powered"    . Cholecalciferol (VITAMIN D3) 5000 units CAPS Take 5,000 Units by mouth daily.     [name] ELIQUIS 2.5 MG TABS tablet Take 1 tablet (2.5 mg total) by mouth 2 (two) times daily. 180 tablet 3  . ferrous sulfate 325 (65 FE) MG tablet Take 325 mg by mouth daily with breakfast.    . polyethylene glycol powder (GLYCOLAX/MIRALAX) 17 GM/SCOOP powder Take 17 g by mouth 2 (two) times daily as needed. 3350 g 1  . tamsulosin (FLOMAX) 0.4 MG CAPS capsule Take 1 capsule (0.4 mg total) by mouth daily after breakfast. 90 capsule 3   No current facility-administered medications for this visit.    Allergies  Allergen Reactions  . Acyclovir And Related Other (See Comments)    Unsure of what happens    Review of Systems negative except from HPI and PMH  Physical Exam BP 130/80   Pulse 80   Ht 6\' 1"  (1.854 m)   Wt 199 lb (90.3 kg)   BMI 26.25 kg/m  Well developed and nourished in no acute distress HENT normal Neck supple   Clear Irregularly irregular rate and rhythm, no murmurs or gallops pulses alternans  Abd-soft with active BS No Clubbing cyanosis edema Skin-warm and dry A & Oriented  Grossly normal sensory and motor function   Sinus at 82 Of 18/09/38 PVCs with a pattern of bigeminy with a right bundle inferior axis morphology  ECGs were reviewed personally for 2019, 2020 both of which showed no PVCs.  ECGs January and March 2021 demonstrated PVCs with  bigeminy.  Assessment and  Plan  Cryptogenic stroke  Atrial fibrillation   Ventricular tachycardia-nonsustained  Implantable loop recorder-LINQ at eri  PVCs right bundle inferior axis  Orthostatic hypotension/syncope    Progressive dementia   Bigeminy seems to be asymptomatic.  Not surprisingly given the fact that we are unable to palpate a pulse because of the late complaint.  Indeed the PVCs follow the P wave.  In the absence of symptoms, would consider drug therapy; however, we would like to avoid this.  Given his dementia, we will see him as needed.  His daughter, a Marine scientist, will diagnose engagements.  Atrial fibrillation paroxysmal.  No clinical bleeding.  Hemoglobin stable

## 2019-09-22 ENCOUNTER — Ambulatory Visit (HOSPITAL_COMMUNITY)
Admission: RE | Admit: 2019-09-22 | Discharge: 2019-09-22 | Disposition: A | Discharge status: Home / Self Care | Payer: PPO | Source: Ambulatory Visit | Attending: Urology | Admitting: Urology

## 2019-09-22 ENCOUNTER — Other Ambulatory Visit (HOSPITAL_COMMUNITY): Payer: Self-pay | Admitting: Urology

## 2019-09-22 ENCOUNTER — Other Ambulatory Visit: Payer: Self-pay

## 2019-09-22 DIAGNOSIS — J690 Pneumonitis due to inhalation of food and vomit: Secondary | ICD-10-CM | POA: Diagnosis not present

## 2019-09-22 DIAGNOSIS — N390 Urinary tract infection, site not specified: Secondary | ICD-10-CM | POA: Diagnosis not present

## 2019-09-22 DIAGNOSIS — D696 Thrombocytopenia, unspecified: Secondary | ICD-10-CM | POA: Diagnosis not present

## 2019-09-22 DIAGNOSIS — C641 Malignant neoplasm of right kidney, except renal pelvis: Secondary | ICD-10-CM | POA: Insufficient documentation

## 2019-09-22 DIAGNOSIS — R410 Disorientation, unspecified: Secondary | ICD-10-CM | POA: Diagnosis not present

## 2019-09-22 DIAGNOSIS — A419 Sepsis, unspecified organism: Secondary | ICD-10-CM | POA: Diagnosis not present

## 2019-09-22 DIAGNOSIS — I480 Paroxysmal atrial fibrillation: Secondary | ICD-10-CM | POA: Diagnosis not present

## 2019-09-22 DIAGNOSIS — E785 Hyperlipidemia, unspecified: Secondary | ICD-10-CM | POA: Diagnosis not present

## 2019-09-22 DIAGNOSIS — I129 Hypertensive chronic kidney disease with stage 1 through stage 4 chronic kidney disease, or unspecified chronic kidney disease: Secondary | ICD-10-CM | POA: Diagnosis not present

## 2019-09-22 DIAGNOSIS — R05 Cough: Secondary | ICD-10-CM | POA: Diagnosis not present

## 2019-09-22 DIAGNOSIS — Z85528 Personal history of other malignant neoplasm of kidney: Secondary | ICD-10-CM | POA: Diagnosis not present

## 2019-09-22 DIAGNOSIS — D6959 Other secondary thrombocytopenia: Secondary | ICD-10-CM | POA: Diagnosis not present

## 2019-09-22 DIAGNOSIS — R404 Transient alteration of awareness: Secondary | ICD-10-CM | POA: Diagnosis not present

## 2019-09-22 DIAGNOSIS — I10 Essential (primary) hypertension: Secondary | ICD-10-CM | POA: Diagnosis not present

## 2019-09-22 DIAGNOSIS — Z820 Family history of epilepsy and other diseases of the nervous system: Secondary | ICD-10-CM | POA: Diagnosis not present

## 2019-09-22 DIAGNOSIS — F0151 Vascular dementia with behavioral disturbance: Secondary | ICD-10-CM | POA: Diagnosis not present

## 2019-09-22 DIAGNOSIS — Z8711 Personal history of peptic ulcer disease: Secondary | ICD-10-CM | POA: Diagnosis not present

## 2019-09-22 DIAGNOSIS — Z8619 Personal history of other infectious and parasitic diseases: Secondary | ICD-10-CM | POA: Diagnosis not present

## 2019-09-22 DIAGNOSIS — G20 Parkinson's disease: Secondary | ICD-10-CM | POA: Diagnosis not present

## 2019-09-22 DIAGNOSIS — R531 Weakness: Secondary | ICD-10-CM | POA: Diagnosis not present

## 2019-09-22 DIAGNOSIS — Z905 Acquired absence of kidney: Secondary | ICD-10-CM | POA: Diagnosis not present

## 2019-09-22 DIAGNOSIS — R0689 Other abnormalities of breathing: Secondary | ICD-10-CM | POA: Diagnosis not present

## 2019-09-22 DIAGNOSIS — G92 Toxic encephalopathy: Secondary | ICD-10-CM | POA: Diagnosis not present

## 2019-09-22 DIAGNOSIS — Z20822 Contact with and (suspected) exposure to covid-19: Secondary | ICD-10-CM | POA: Diagnosis not present

## 2019-09-22 DIAGNOSIS — I491 Atrial premature depolarization: Secondary | ICD-10-CM | POA: Diagnosis not present

## 2019-09-22 DIAGNOSIS — R269 Unspecified abnormalities of gait and mobility: Secondary | ICD-10-CM | POA: Diagnosis not present

## 2019-09-22 DIAGNOSIS — R6510 Systemic inflammatory response syndrome (SIRS) of non-infectious origin without acute organ dysfunction: Secondary | ICD-10-CM | POA: Diagnosis not present

## 2019-09-22 DIAGNOSIS — Z808 Family history of malignant neoplasm of other organs or systems: Secondary | ICD-10-CM | POA: Diagnosis not present

## 2019-09-22 DIAGNOSIS — J939 Pneumothorax, unspecified: Secondary | ICD-10-CM | POA: Diagnosis not present

## 2019-09-22 DIAGNOSIS — F0391 Unspecified dementia with behavioral disturbance: Secondary | ICD-10-CM | POA: Diagnosis not present

## 2019-09-22 DIAGNOSIS — Z7901 Long term (current) use of anticoagulants: Secondary | ICD-10-CM | POA: Diagnosis not present

## 2019-09-22 DIAGNOSIS — G9340 Encephalopathy, unspecified: Secondary | ICD-10-CM | POA: Diagnosis not present

## 2019-09-22 DIAGNOSIS — N1832 Chronic kidney disease, stage 3b: Secondary | ICD-10-CM | POA: Diagnosis not present

## 2019-09-22 DIAGNOSIS — F0281 Dementia in other diseases classified elsewhere with behavioral disturbance: Secondary | ICD-10-CM | POA: Diagnosis not present

## 2019-09-22 DIAGNOSIS — Z9049 Acquired absence of other specified parts of digestive tract: Secondary | ICD-10-CM | POA: Diagnosis not present

## 2019-09-22 DIAGNOSIS — R0902 Hypoxemia: Secondary | ICD-10-CM | POA: Diagnosis not present

## 2019-09-22 DIAGNOSIS — M21371 Foot drop, right foot: Secondary | ICD-10-CM | POA: Diagnosis not present

## 2019-09-22 DIAGNOSIS — I69398 Other sequelae of cerebral infarction: Secondary | ICD-10-CM | POA: Diagnosis not present

## 2019-09-22 DIAGNOSIS — K224 Dyskinesia of esophagus: Secondary | ICD-10-CM | POA: Diagnosis not present

## 2019-09-22 DIAGNOSIS — G9341 Metabolic encephalopathy: Secondary | ICD-10-CM | POA: Diagnosis not present

## 2019-09-24 ENCOUNTER — Emergency Department (HOSPITAL_COMMUNITY): Payer: PPO

## 2019-09-24 ENCOUNTER — Inpatient Hospital Stay (HOSPITAL_COMMUNITY)
Admission: EM | Admit: 2019-09-24 | Discharge: 2019-09-28 | DRG: 871 | Disposition: A | Discharge status: Home Health | Payer: PPO | Attending: Family Medicine | Admitting: Family Medicine

## 2019-09-24 ENCOUNTER — Other Ambulatory Visit: Payer: Self-pay

## 2019-09-24 ENCOUNTER — Encounter (HOSPITAL_COMMUNITY): Payer: Self-pay | Admitting: *Deleted

## 2019-09-24 ENCOUNTER — Ambulatory Visit: Payer: PPO | Admitting: Internal Medicine

## 2019-09-24 DIAGNOSIS — G9341 Metabolic encephalopathy: Secondary | ICD-10-CM | POA: Diagnosis not present

## 2019-09-24 DIAGNOSIS — Z9049 Acquired absence of other specified parts of digestive tract: Secondary | ICD-10-CM | POA: Diagnosis not present

## 2019-09-24 DIAGNOSIS — Z8711 Personal history of peptic ulcer disease: Secondary | ICD-10-CM

## 2019-09-24 DIAGNOSIS — G9340 Encephalopathy, unspecified: Secondary | ICD-10-CM

## 2019-09-24 DIAGNOSIS — N183 Chronic kidney disease, stage 3 unspecified: Secondary | ICD-10-CM | POA: Diagnosis present

## 2019-09-24 DIAGNOSIS — Z820 Family history of epilepsy and other diseases of the nervous system: Secondary | ICD-10-CM | POA: Diagnosis not present

## 2019-09-24 DIAGNOSIS — A419 Sepsis, unspecified organism: Principal | ICD-10-CM | POA: Diagnosis present

## 2019-09-24 DIAGNOSIS — D696 Thrombocytopenia, unspecified: Secondary | ICD-10-CM | POA: Diagnosis present

## 2019-09-24 DIAGNOSIS — G92 Toxic encephalopathy: Secondary | ICD-10-CM | POA: Diagnosis present

## 2019-09-24 DIAGNOSIS — Z888 Allergy status to other drugs, medicaments and biological substances status: Secondary | ICD-10-CM

## 2019-09-24 DIAGNOSIS — Z20822 Contact with and (suspected) exposure to covid-19: Secondary | ICD-10-CM | POA: Diagnosis present

## 2019-09-24 DIAGNOSIS — R269 Unspecified abnormalities of gait and mobility: Secondary | ICD-10-CM | POA: Diagnosis present

## 2019-09-24 DIAGNOSIS — Z808 Family history of malignant neoplasm of other organs or systems: Secondary | ICD-10-CM

## 2019-09-24 DIAGNOSIS — Z905 Acquired absence of kidney: Secondary | ICD-10-CM | POA: Diagnosis not present

## 2019-09-24 DIAGNOSIS — I129 Hypertensive chronic kidney disease with stage 1 through stage 4 chronic kidney disease, or unspecified chronic kidney disease: Secondary | ICD-10-CM | POA: Diagnosis present

## 2019-09-24 DIAGNOSIS — Z8619 Personal history of other infectious and parasitic diseases: Secondary | ICD-10-CM

## 2019-09-24 DIAGNOSIS — R6510 Systemic inflammatory response syndrome (SIRS) of non-infectious origin without acute organ dysfunction: Secondary | ICD-10-CM

## 2019-09-24 DIAGNOSIS — D6959 Other secondary thrombocytopenia: Secondary | ICD-10-CM | POA: Diagnosis present

## 2019-09-24 DIAGNOSIS — F0151 Vascular dementia with behavioral disturbance: Secondary | ICD-10-CM | POA: Diagnosis present

## 2019-09-24 DIAGNOSIS — F0281 Dementia in other diseases classified elsewhere with behavioral disturbance: Secondary | ICD-10-CM | POA: Diagnosis present

## 2019-09-24 DIAGNOSIS — Z7901 Long term (current) use of anticoagulants: Secondary | ICD-10-CM

## 2019-09-24 DIAGNOSIS — F0391 Unspecified dementia with behavioral disturbance: Secondary | ICD-10-CM | POA: Diagnosis not present

## 2019-09-24 DIAGNOSIS — N390 Urinary tract infection, site not specified: Secondary | ICD-10-CM | POA: Diagnosis present

## 2019-09-24 DIAGNOSIS — Z79899 Other long term (current) drug therapy: Secondary | ICD-10-CM

## 2019-09-24 DIAGNOSIS — I69398 Other sequelae of cerebral infarction: Secondary | ICD-10-CM | POA: Diagnosis not present

## 2019-09-24 DIAGNOSIS — Z85528 Personal history of other malignant neoplasm of kidney: Secondary | ICD-10-CM | POA: Diagnosis not present

## 2019-09-24 DIAGNOSIS — I480 Paroxysmal atrial fibrillation: Secondary | ICD-10-CM | POA: Diagnosis present

## 2019-09-24 DIAGNOSIS — F03918 Unspecified dementia, unspecified severity, with other behavioral disturbance: Secondary | ICD-10-CM

## 2019-09-24 DIAGNOSIS — Z87891 Personal history of nicotine dependence: Secondary | ICD-10-CM

## 2019-09-24 DIAGNOSIS — G20 Parkinson's disease: Secondary | ICD-10-CM | POA: Diagnosis present

## 2019-09-24 DIAGNOSIS — J690 Pneumonitis due to inhalation of food and vomit: Secondary | ICD-10-CM | POA: Diagnosis present

## 2019-09-24 DIAGNOSIS — N1832 Chronic kidney disease, stage 3b: Secondary | ICD-10-CM | POA: Diagnosis present

## 2019-09-24 DIAGNOSIS — E785 Hyperlipidemia, unspecified: Secondary | ICD-10-CM | POA: Diagnosis present

## 2019-09-24 DIAGNOSIS — M21371 Foot drop, right foot: Secondary | ICD-10-CM | POA: Diagnosis present

## 2019-09-24 DIAGNOSIS — K222 Esophageal obstruction: Secondary | ICD-10-CM

## 2019-09-24 DIAGNOSIS — N12 Tubulo-interstitial nephritis, not specified as acute or chronic: Secondary | ICD-10-CM

## 2019-09-24 LAB — COMPREHENSIVE METABOLIC PANEL
ALT: 18 U/L (ref 0–44)
AST: 16 U/L (ref 15–41)
Albumin: 3.5 g/dL (ref 3.5–5.0)
Alkaline Phosphatase: 43 U/L (ref 38–126)
Anion gap: 8 (ref 5–15)
BUN: 24 mg/dL — ABNORMAL HIGH (ref 8–23)
CO2: 23 mmol/L (ref 22–32)
Calcium: 7.6 mg/dL — ABNORMAL LOW (ref 8.9–10.3)
Chloride: 108 mmol/L (ref 98–111)
Creatinine, Ser: 1.55 mg/dL — ABNORMAL HIGH (ref 0.61–1.24)
GFR calc Af Amer: 48 mL/min — ABNORMAL LOW (ref >60)
GFR calc non Af Amer: 41 mL/min — ABNORMAL LOW (ref >60)
Glucose, Bld: 99 mg/dL (ref 70–99)
Potassium: 3.5 mmol/L (ref 3.5–5.1)
Sodium: 139 mmol/L (ref 135–145)
Total Bilirubin: 1.1 mg/dL (ref 0.3–1.2)
Total Protein: 5.6 g/dL — ABNORMAL LOW (ref 6.5–8.1)

## 2019-09-24 LAB — CBC WITH DIFFERENTIAL/PLATELET
Abs Immature Granulocytes: 0.05 10*3/uL (ref 0.00–0.07)
Basophils Absolute: 0 10*3/uL (ref 0.0–0.1)
Basophils Relative: 0 %
Eosinophils Absolute: 0 10*3/uL (ref 0.0–0.5)
Eosinophils Relative: 0 %
HCT: 39.5 % (ref 39.0–52.0)
Hemoglobin: 13.1 g/dL (ref 13.0–17.0)
Immature Granulocytes: 1 %
Lymphocytes Relative: 4 %
Lymphs Abs: 0.4 10*3/uL — ABNORMAL LOW (ref 0.7–4.0)
MCH: 32.7 pg (ref 26.0–34.0)
MCHC: 33.2 g/dL (ref 30.0–36.0)
MCV: 98.5 fL (ref 80.0–100.0)
Monocytes Absolute: 0.7 10*3/uL (ref 0.1–1.0)
Monocytes Relative: 7 %
Neutro Abs: 9.6 10*3/uL — ABNORMAL HIGH (ref 1.7–7.7)
Neutrophils Relative %: 88 %
Platelets: 100 10*3/uL — ABNORMAL LOW (ref 150–400)
RBC: 4.01 MIL/uL — ABNORMAL LOW (ref 4.22–5.81)
RDW: 13 % (ref 11.5–15.5)
WBC: 10.9 10*3/uL — ABNORMAL HIGH (ref 4.0–10.5)
nRBC: 0 % (ref 0.0–0.2)

## 2019-09-24 LAB — URINALYSIS, ROUTINE W REFLEX MICROSCOPIC
Bilirubin Urine: NEGATIVE
Glucose, UA: NEGATIVE mg/dL
Ketones, ur: 5 mg/dL — AB
Nitrite: POSITIVE — AB
Protein, ur: 30 mg/dL — AB
Specific Gravity, Urine: 1.014 (ref 1.005–1.030)
WBC, UA: >50 WBC/hpf — ABNORMAL HIGH (ref 0–5)
pH: 6 (ref 5.0–8.0)

## 2019-09-24 LAB — SARS CORONAVIRUS 2 BY RT PCR (HOSPITAL ORDER, PERFORMED IN ~~LOC~~ HOSPITAL LAB): SARS Coronavirus 2: NEGATIVE

## 2019-09-24 LAB — LACTIC ACID, PLASMA
Lactic Acid, Venous: 5.6 mmol/L (ref 0.5–1.9)
Lactic Acid, Venous: 0.9 mmol/L (ref 0.5–1.9)

## 2019-09-24 MED ORDER — SODIUM CHLORIDE 0.9 % IV SOLN
1.0000 g | Freq: Once | INTRAVENOUS | Status: AC
  Administered 2019-09-24: 1 g via INTRAVENOUS
  Filled 2019-09-24: qty 10

## 2019-09-24 MED ORDER — HALOPERIDOL LACTATE 5 MG/ML IJ SOLN
2.0000 mg | Freq: Once | INTRAMUSCULAR | Status: AC
  Administered 2019-09-24: 2 mg via INTRAVENOUS
  Filled 2019-09-24: qty 1

## 2019-09-24 MED ORDER — SODIUM CHLORIDE 0.9 % IV SOLN: INTRAVENOUS | Status: DC

## 2019-09-24 MED ORDER — ACETAMINOPHEN 650 MG RE SUPP
650.0000 mg | Freq: Once | RECTAL | Status: AC
  Administered 2019-09-24: 650 mg via RECTAL
  Filled 2019-09-24: qty 1

## 2019-09-24 MED ORDER — ACETAMINOPHEN 325 MG PO TABS
650.0000 mg | ORAL_TABLET | Freq: Four times a day (QID) | ORAL | Status: DC | PRN
  Administered 2019-09-25 – 2019-09-27 (×5): 650 mg via ORAL
  Filled 2019-09-24 (×5): qty 2

## 2019-09-24 MED ORDER — SODIUM CHLORIDE 0.9 % IV SOLN
1.0000 g | INTRAVENOUS | Status: DC
  Administered 2019-09-25: 1 g via INTRAVENOUS
  Filled 2019-09-24: qty 1

## 2019-09-24 MED ORDER — VITAMIN D 25 MCG (1000 UNIT) PO TABS
5000.0000 [IU] | ORAL_TABLET | Freq: Every day | ORAL | Status: DC
  Administered 2019-09-25 – 2019-09-28 (×4): 5000 [IU] via ORAL
  Filled 2019-09-24 (×4): qty 5

## 2019-09-24 MED ORDER — SODIUM CHLORIDE 0.9 % IV BOLUS
500.0000 mL | Freq: Once | INTRAVENOUS | Status: AC
  Administered 2019-09-24: 500 mL via INTRAVENOUS

## 2019-09-24 MED ORDER — FERROUS SULFATE 325 (65 FE) MG PO TABS
325.0000 mg | ORAL_TABLET | Freq: Every day | ORAL | Status: DC
  Administered 2019-09-25 – 2019-09-28 (×4): 325 mg via ORAL
  Filled 2019-09-24 (×4): qty 1

## 2019-09-24 MED ORDER — POLYETHYLENE GLYCOL 3350 17 G PO PACK: 17.0000 g | PACK | Freq: Two times a day (BID) | ORAL | Status: DC | PRN

## 2019-09-24 MED ORDER — SENNOSIDES-DOCUSATE SODIUM 8.6-50 MG PO TABS: 1.0000 | ORAL_TABLET | Freq: Every day | ORAL | Status: DC | PRN

## 2019-09-24 MED ORDER — ONDANSETRON HCL 4 MG/2ML IJ SOLN
4.0000 mg | Freq: Once | INTRAMUSCULAR | Status: AC
  Administered 2019-09-24: 4 mg via INTRAVENOUS
  Filled 2019-09-24: qty 2

## 2019-09-24 MED ORDER — APIXABAN 2.5 MG PO TABS
2.5000 mg | ORAL_TABLET | Freq: Two times a day (BID) | ORAL | Status: DC
  Administered 2019-09-24 – 2019-09-28 (×8): 2.5 mg via ORAL
  Filled 2019-09-24 (×8): qty 1

## 2019-09-24 MED ORDER — TAMSULOSIN HCL 0.4 MG PO CAPS
0.4000 mg | ORAL_CAPSULE | Freq: Every day | ORAL | Status: DC
  Administered 2019-09-25 – 2019-09-28 (×4): 0.4 mg via ORAL
  Filled 2019-09-24 (×4): qty 1

## 2019-09-24 MED ORDER — SODIUM CHLORIDE 0.9 % IV BOLUS
1000.0000 mL | Freq: Once | INTRAVENOUS | Status: AC
  Administered 2019-09-24: 1000 mL via INTRAVENOUS

## 2019-09-24 NOTE — ED Provider Notes (Signed)
[name] DEPT Provider Note   CSN: [id number] Arrival date & time: 09/24/19  1518     History Chief Complaint  Patient presents with  . Weakness    Jeffrey Hardy is a 84 y.o. male.  Jeffrey Hardy is brought in by EMS from home.  Per EMS Jeffrey Hardy has had increased confusion today.  Yesterday was diagnosed with a UTI and started on antibiotics.  Patient does not recall this, level 5 caveat secondary to altered mental status, dementia.  Jeffrey Hardy is oriented to self only.  Jeffrey Hardy denies any pain.  Jeffrey Hardy is not sure if Jeffrey Hardy had a fever.  The history is provided by the patient and the EMS personnel.  Weakness Severity:  Moderate Progression:  Unchanged Chronicity:  New Context: urinary tract infection   Relieved by:  Nothing Worsened by:  Nothing Ineffective treatments:  None tried Associated symptoms: no abdominal pain, no chest pain, no dysuria, no fever, no headaches, no nausea, no shortness of breath and no vomiting   Risk factors: neurologic disease        Past Medical History:  Diagnosis Date  . Anxiety   . Bilateral renal cysts   . Cancer ([place])    renal mass  . Dehydration 08/2018  . Depression   . Diverticulosis of colon   . Erectile dysfunction   . Hematuria   . History of cerebral embolic infarction x2 CVA's ---  neurologist-  [name]--- per pt residual abnormal gait , does need cane   12-16-2011  right frontal embolic infarct and [id number] left frontal embolic infarct (per first MRI previous hemorrgaic cerebral ischemia)  . History of duodenal ulcer    1997  . History of Helicobacter pylori infection    1997  . History of hiatal hernia   . History of loop recorder    Current  . History of syncope    1997  and 2014  s/p  loop recorder  . Hyperlipidemia   . Hypertension   . Nocturia   . PAF (paroxysmal atrial fibrillation) [facility])    cardiologist-  dr [name]  . Peripheral neuropathy 05/04/2018  . Renal cell cancer ([place]) 09/06/2016  . Right renal mass   .  Status post placement of implantable loop recorder    07-06-2012  . Stroke [facility])    2013 2 mild strokes mild weakness bil sides  . Urgency of urination   . Wears hearing aid    right ear only    Patient Active Problem List   Diagnosis Date Noted  . Orthostatic hypotension 09/06/2019  . Bradycardia 09/06/2019  . Bigeminy 07/01/2019  . Paresthesias 05/03/2019  . Vomiting 05/03/2019  . Falls frequently 08/28/2018  . Gait disorder 08/24/2018  . Insomnia 08/24/2018  . Peripheral neuropathy 05/04/2018  . Syncope 02/06/2018  . Closed displaced fracture of right femoral neck ([place]) 02/01/2018  . PAF (paroxysmal atrial fibrillation) ([place]) 01/30/2018  . Closed right hip fracture, initial encounter ([place]) 01/29/2018  . CKD (chronic kidney disease) stage 3, GFR 30-59 ml/min 09/04/2017  . Hip fracture ([place]) 09/04/2017  . Renal cell cancer ([place]) 09/06/2016  . Urinary frequency 03/08/2016  . Dementia without behavioral disturbance ([place]) 03/08/2016  . History of colonic polyps 07/07/2015  . Hyperglycemia 07/04/2015  . Cerebrovascular disease, arteriosclerotic, post-stroke 12/28/2013  . Shingles 06/29/2013  . Incontinent of urine 12/29/2012  . Thrombocytopenia ([place]) 01/13/2012  . 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 Preventative health care 07/27/2010  . Hyperlipidemia 05/12/2008  . Essential  hypertension 05/12/2008  . GERD 05/12/2008  . DIVERTICULOSIS, COLON 05/12/2008    Past Surgical History:  Procedure Laterality Date  . APPENDECTOMY  age 51  . COLONOSCOPY  last one 07-17-2015  . CYSTOSCOPY/RETROGRADE/URETEROSCOPY Right 04/22/2016   Procedure: CYSTOSCOPY/RETROGRADE/URETEROSCOPY/ BIOPSY/ STENT PLACEMENT;  Surgeon: [name], MD;  Location: [facility];  Service: Urology;  Laterality: Right;  . ESOPHAGOGASTRODUODENOSCOPY  last one 09-16-2014  . LOOP RECORDER IMPLANT N/A 07/06/2012   Procedure: LOOP RECORDER IMPLANT;  Surgeon: [name], MD;  Location: [facility] CATH LAB;   Service: Cardiovascular;  Laterality: N/A;  . ROBOT ASSITED LAPAROSCOPIC NEPHROURETERECTOMY Right 05/13/2016   Procedure: XI ROBOT ASSITED LAPAROSCOPIC NEPHROURETERECTOMY;  Surgeon: [name], MD;  [facility];  Service: Urology;  Laterality: Right;  . TEE WITHOUT CARDIOVERSION  12/19/2011   Procedure: TRANSESOPHAGEAL ECHOCARDIOGRAM (TEE);  Surgeon: [name], MD;  Location: [facility] ENDOSCOPY;  Service: Cardiovascular;  Laterality: N/A;  no evidence thrombus in the atrial cavity or appendage;  mild AV thickened w/ mild AI;  minimal fixed plaque in thoracic aorta;  mild MV thickened w/ mild MR;  NO pfo by color doppler or with injection of agitated saline  . TOTAL HIP ARTHROPLASTY Left 09/07/2017   Procedure: TOTAL HIP ARTHROPLASTY ANTERIOR APPROACH;  Surgeon: Rod Can, MD;  [facility];  Service: Orthopedics;  Laterality: Left;  . TOTAL HIP ARTHROPLASTY Right 02/01/2018   Procedure: TOTAL HIP ARTHROPLASTY ANTERIOR APPROACH;  Surgeon: Rod Can, MD;  Location: [place];  Service: Orthopedics;  Laterality: Right;  . TRANSTHORACIC ECHOCARDIOGRAM  09-10-20103   grade 1 diastolic dysfunction,  ef 55-60%/  AV sclerosis without stenosis/  trivial TR  . WRIST FRACTURE SURGERY Right 2014  approx.   retained hardware       Family History  Problem Relation Age of Onset  . Heart Problems Mother   . Brain cancer Father   . Parkinsonism Brother   . Dementia Brother   . Colon cancer Neg Hx   . Esophageal cancer Neg Hx   . Stomach cancer Neg Hx   . Gastric cancer Neg Hx   . Liver disease Neg Hx   . Kidney disease Neg Hx   . Diabetes Neg Hx     Social History   Tobacco Use  . Smoking status: Former Smoker    Years: 5.00    Types: Cigarettes, Pipe    Quit date: 09/07/1966    Years since quitting: 53.0  . Smokeless tobacco: Never Used  Vaping Use  . Vaping Use: Never used  Substance Use Topics  . Alcohol use: No    Alcohol/week: 0.0 standard drinks  . Drug use: No     Home Medications Prior to Admission medications   Medication Sig Start Date End Date Taking? Authorizing Provider  B Complex Vitamins (VITAMIN B COMPLEX PO) Take 1 tablet by mouth daily. "high powered"    [provider]  Cholecalciferol (VITAMIN D3) 5000 units CAPS Take 5,000 Units by mouth daily.     [provider]  ELIQUIS 2.5 MG TABS tablet Take 1 tablet (2.5 mg total) by mouth 2 (two) times daily. 07/06/19   Shirley Friar, PA-C  ferrous sulfate 325 (65 FE) MG tablet Take 325 mg by mouth daily with breakfast.    [provider]  polyethylene glycol powder (GLYCOLAX/MIRALAX) 17 GM/SCOOP powder Take 17 g by mouth 2 (two) times daily as needed. 09/10/18   Biagio Borg, MD  tamsulosin [facility])  0.4 MG CAPS capsule Take 1 capsule (0.4 mg total) by mouth daily after breakfast. 09/06/16   Biagio Borg, MD    Allergies    Acyclovir and related  Review of Systems   Review of Systems  Constitutional: Negative for fever.  HENT: Negative for sore throat.   Eyes: Negative for visual disturbance.  Respiratory: Negative for shortness of breath.   Cardiovascular: Negative for chest pain.  Gastrointestinal: Negative for abdominal pain, nausea and vomiting.  Genitourinary: Negative for dysuria.  Musculoskeletal: Negative for neck pain.  Skin: Negative for rash.  Neurological: Positive for weakness. Negative for headaches.  Psychiatric/Behavioral: Positive for confusion.    Physical Exam Updated Vital Signs BP (!) 149/127 (BP Location: Right Arm)   Pulse 99   Temp 99.6 F (37.6 C) (Oral)   Resp 20   Ht 6\' 1"  (1.854 m)   Wt 90 kg   SpO2 100%   BMI 26.18 kg/m   Physical Exam Vitals and nursing note reviewed.  Constitutional:      Appearance: Normal appearance. Jeffrey Hardy is well-developed.  HENT:     Head: Normocephalic and atraumatic.  Eyes:     Conjunctiva/sclera: Conjunctivae normal.  Cardiovascular:     Rate and Rhythm: Normal rate and regular  rhythm.     Heart sounds: No murmur heard.   Pulmonary:     Effort: Pulmonary effort is normal. No respiratory distress.     Breath sounds: Normal breath sounds.  Abdominal:     Palpations: Abdomen is soft.     Tenderness: There is no abdominal tenderness.  Musculoskeletal:        General: No deformity or signs of injury.     Cervical back: Neck supple.  Skin:    General: Skin is warm and dry.     Capillary Refill: Capillary refill takes less than 2 seconds.  Neurological:     General: No focal deficit present.     Mental Status: Jeffrey Hardy is alert. Jeffrey Hardy is disoriented.     Sensory: No sensory deficit.     Motor: No weakness.     ED Results / Procedures / Treatments   Labs (all labs ordered are listed, but only abnormal results are displayed) Labs Reviewed  COMPREHENSIVE METABOLIC PANEL - Abnormal; Notable for the following components:      Result Value   BUN 24 (*)    Creatinine, Ser 1.55 (*)    Calcium 7.6 (*)    Total Protein 5.6 (*)    GFR calc non Af Amer 41 (*)    GFR calc Af Amer 48 (*)    All other components within normal limits  CBC WITH DIFFERENTIAL/PLATELET - Abnormal; Notable for the following components:   WBC 10.9 (*)    RBC 4.01 (*)    Platelets 100 (*)    Neutro Abs 9.6 (*)    Lymphs Abs 0.4 (*)    All other components within normal limits  URINALYSIS, ROUTINE W REFLEX MICROSCOPIC - Abnormal; Notable for the following components:   APPearance HAZY (*)    Hgb urine dipstick SMALL (*)    Ketones, ur 5 (*)    Protein, ur 30 (*)    Nitrite POSITIVE (*)    Leukocytes,Ua LARGE (*)    WBC, UA >50 (*)    Bacteria, UA MANY (*)    All other components within normal limits  LACTIC ACID, PLASMA - Abnormal; Notable for the following components:   Lactic Acid, Venous 5.6 (*)  All other components within normal limits  BASIC METABOLIC PANEL - Abnormal; Notable for the following components:   BUN 25 (*)    Creatinine, Ser 1.82 (*)    Calcium 8.1 (*)    GFR calc  non Af Amer 34 (*)    GFR calc Af Amer 39 (*)    All other components within normal limits  CBC - Abnormal; Notable for the following components:   RBC 3.91 (*)    Hemoglobin 12.7 (*)    HCT 38.4 (*)    Platelets 85 (*)    All other components within normal limits  CULTURE, BLOOD (ROUTINE X 2)  SARS CORONAVIRUS 2 BY RT PCR (HOSPITAL ORDER, [place] [facility])  URINE CULTURE  LACTIC ACID, PLASMA  LACTIC ACID, PLASMA    EKG EKG Interpretation  Date/Time:  Friday September 24 2019 16:25:14 EDT Ventricular Rate:  92 PR Interval:    QRS Duration: 109 QT Interval:  376 QTC Calculation: 412 R Axis:   85 Text Interpretation: Sinus rhythm Ventricular bigeminy Borderline right axis deviation Abnormal R-wave progression, early transition No significant change since prior 1/21 Confirmed by Aletta Edouard [phone]) on 09/24/2019 4:29:06 PM   Radiology DG Chest Port 1 View  Result Date: 09/24/2019 CLINICAL DATA:  Confusion and weakness EXAM: PORTABLE CHEST 1 VIEW COMPARISON:  01/29/2018, 09/22/2019 FINDINGS: Recording device over left chest wall. No focal airspace disease or effusion. Stable cardiomediastinal silhouette with aortic atherosclerosis. No pneumothorax. IMPRESSION: No active disease. Electronically Signed   By: Donavan Foil M.D.   On: 09/24/2019 16:05    Procedures .Critical Care Performed by: Hayden Rasmussen, MD Authorized by: Hayden Rasmussen, MD   Critical care provider statement:    Critical care time (minutes):  45   Critical care time was exclusive of:  Separately billable procedures and treating other patients   Critical care was necessary to treat or prevent imminent or life-threatening deterioration of the following conditions:  Sepsis   Critical care was time spent personally by me on the following activities:  Discussions with consultants, evaluation of patient's response to treatment, examination of patient, ordering and performing treatments and  interventions, ordering and review of laboratory studies, ordering and review of radiographic studies, pulse oximetry, re-evaluation of patient's condition, obtaining history from patient or surrogate, review of old charts and development of treatment plan with patient or surrogate   I assumed direction of critical care for this patient from another provider in my specialty: no     (including critical care time)  Medications Ordered in ED Medications  cefTRIAXone (ROCEPHIN) 1 g in sodium chloride 0.9 % 100 mL IVPB (has no administration in time range)  polyethylene glycol (MIRALAX / GLYCOLAX) packet 17 g (has no administration in time range)  senna-docusate (Senokot-S) tablet 1 tablet (has no administration in time range)  tamsulosin (FLOMAX) capsule 0.4 mg (0.4 mg Oral Given 09/25/19 0924)  apixaban (ELIQUIS) tablet 2.5 mg (2.5 mg Oral Given 09/25/19 0924)  ferrous sulfate tablet 325 mg (325 mg Oral Given 09/25/19 0923)  cholecalciferol (VITAMIN D3) tablet 5,000 Units (5,000 Units Oral Given 09/25/19 0922)  acetaminophen (TYLENOL) tablet 650 mg (650 mg Oral Given 09/25/19 0923)  0.9 %  sodium chloride infusion ( Intravenous New Bag/Given 09/24/19 2112)  sodium chloride 0.9 % bolus 500 mL (0 mLs Intravenous Stopped 09/24/19 1631)  cefTRIAXone (ROCEPHIN) 1 g in sodium chloride 0.9 % 100 mL IVPB (0 g Intravenous Stopped 09/24/19 1830)  haloperidol lactate (  HALDOL) injection 2 mg (2 mg Intravenous Given 09/24/19 1806)  ondansetron (ZOFRAN) injection 4 mg (4 mg Intravenous Given 09/24/19 1807)  acetaminophen (TYLENOL) suppository 650 mg (650 mg Rectal Given 09/24/19 1851)  sodium chloride 0.9 % bolus 500 mL (500 mLs Intravenous New Bag/Given 09/24/19 1851)  sodium chloride 0.9 % bolus 1,000 mL (1,000 mLs Intravenous New Bag/Given 09/24/19 2006)    ED Course  I have reviewed the triage vital signs and the nursing notes.  Pertinent labs & imaging results that were available during my care of the patient  were reviewed by me and considered in my medical decision making (see chart for details).  Clinical Course as of Sep 25 1051  Fri Sep 24, 2019  1559 Patient's daughter is here and able to provide little more history.  She is a Marine scientist.  She said over the past 3 days Jeffrey Hardy has had change in behavior, being more argumentative and fidgety.  She ended up sleeping over last night.  Jeffrey Hardy usually uses a condom cath since April and that is been good for him so Jeffrey Hardy does not get up in the middle of the night.  Visiting nurses came by last evening and did a urine dipstick which was positive and they prescribed him Bactrim which Jeffrey Hardy is taken 2 doses.  Today was just so weak and fidgety and could not get up out of bed so they elected to call 911 have the patient come here.  No recent falls.  No recent medication changes other than the new antibiotics.   [MB]  1660 Chest x-ray interpreted and ordered by me showing no acute infiltrates.   [MB]  6301 Patient becoming increasingly agitated here.  Jeffrey Hardy pulled out his IV.  Has elevated white count and grossly infected urine.  Daughter states she is afraid to take him home.  Will proceed with getting some blood cultures lactate and admission.   [MB]  [id number] Discussed with Dr. Flossie Buffy [facility] hospitalist who will evaluate the patient for admission.   [MB]  1829 Patient settling down after medication.  Nurse read temp him and found it to be 103.  Rectal Tylenol ordered.   [MB]    Clinical Course User Index [MB] Hayden Rasmussen, MD   MDM Rules/Calculators/A&P                         This patient/daughter complains of fever increased confusion urinary tract infection; this involves an extensive number of treatment Options and is a complaint that carries with it a high risk of complications and Morbidity. The differential includes sepsis, Sirs, metabolic derangement UTI, pyelonephritis, encephalopathy  I ordered, reviewed and interpreted labs, which included CBC with mildly elevated  white count, stable hemoglobin, chemistries with mild CKD, urinalysis grossly infected nitrite positive blood cultures pending, Covid testing negative.  Later during the patient's hospital course Jeffrey Hardy spiked a temp up to 103 and was cultured and a lactate was drawn which was elevated at 5.6. I ordered medication IV fluids Tylenol and antibiotics, Haldol for agitation I ordered imaging studies which included chest x-ray and I independently    visualized and interpreted imaging which showed no acute infiltrates Additional history obtained from patient's daughter Previous records obtained and reviewed in epic I consulted Dr. Flossie Buffy [facility] hospitalist and discussed lab and imaging findings  Critical Interventions: Work-up of encephalopathy and management of SIRS/sepsis, early initiation of IV antibiotics and fluids  After the interventions stated above, I reevaluated  the patient and found patient's hemodynamics trending back towards baseline.  Jeffrey Hardy will need to be admitted to the hospital for further management.  Daughter in agreement with plan.   Final Clinical Impression(s) / ED Diagnoses Final diagnoses:  Lower urinary tract infectious disease  Acute encephalopathy  SIRS (systemic inflammatory response syndrome) ([place])    Rx / DC Orders ED Discharge Orders    None       Hayden Rasmussen, MD 09/25/19 1100

## 2019-09-24 NOTE — ED Notes (Signed)
Pt has become increasingly agitated since arrival. Daughter at bedside unable to reorient pt at this point. When entering the room it was noted that pt had pulled out IV and was bleeding. Bleeding controlled and pt cleaned up. Also restarted IV. Pt then become nauseated with two episodes of emesis. MD made aware of agitation as well as pt vomiting. Medications given, pt placed in gown and resettled.

## 2019-09-24 NOTE — ED Notes (Signed)
Date and time results received: 09/24/19 1930 (use smartphrase ".now" to insert current time)  Test: lactic Acid Critical Value:5.6 Name of Provider Notified: Orders Received? Or Actions Taken?:

## 2019-09-24 NOTE — ED Notes (Signed)
Attempted to call report however POC changed and pt will need cardiac monitoring

## 2019-09-24 NOTE — ED Triage Notes (Signed)
Pt was diagnosed with UTI on 6/17 and started on antibiotic treatment. Today noted with increased weakness and confusion and called EMS. Pt from home with wife.

## 2019-09-24 NOTE — Plan of Care (Signed)

## 2019-09-24 NOTE — H&P (Addendum)
History and Physical    Jeffrey Hardy ZOX:[id number] DOB: 1935/06/19 DOA: 09/24/2019  PCP: Biagio Borg, MD  Patient coming from: Home, accompanied by daughter  I have personally briefly reviewed patient's old medical records in [place]  Chief Complaint: Agitation restlessness  HPI: Jeffrey Hardy is a 84 y.o. male with medical history significant for dementia, CVA, paroxysmal atrial fibrillation on Eliquis, CKD stage III, history of renal cell cancer s/p right nephrectomy, hypertension, hyperlipidemia who presents with concerns of worsening restlessness and agitation.  Daughter at bedside provides history as patient has dementia and is oriented only to self. Daughter is a Marine scientist herself.  She reports that for the past 3 days he has had increased restlessness and agitation.  Could not sit still which is unlike him.  He is usually calm and cooperative.  He normally wears a condom catheter at night to avoid getting out of bed but is usually continent during the day and able to use the commode with assistance.  She has noticed that he has had increased urinary frequency and appeared uncomfortable with urination.  Then last night they had home health paramedics come out and did a urine dipstick showing that he was positive for UTI.  He also had a fever of 100.5.  He was started on Bactrim and received a dose last night and this morning.  Today he was more restless and lethargic and family decided to bring him in for further evaluation.  She denies seeing any symptoms of cough or runny nose.  He has some labored breathing when agitated.  He has had normal p.o. intake however had episode of vomiting in the ED.  No diarrhea.  At baseline with his dementia he usually is oriented to self and recognizes family.   ED Course: ED, he was febrile up to 103.  He also was agitated and had pulled out his IV requiring 2 mg of Haldol.  Currently only oriented to self and could not recognize his daughter.   Appears lethargic.  Lab work significant for mild leukocytosis of 10.9. Plt of 100. Creatinine of 1.55 which is lower than prior months. UA showed positive nitrate, large leukocytes and many bacteria.    Review of Systems: Unable to fully obtain due to patient's dementia  Past Medical History:  Diagnosis Date  . Anxiety   . Bilateral renal cysts   . Cancer ([place])    renal mass  . Dehydration 08/2018  . Depression   . Diverticulosis of colon   . Erectile dysfunction   . Hematuria   . History of cerebral embolic infarction x2 CVA's ---  neurologist-  [name]--- per pt residual abnormal gait , does need cane   12-16-2011  right frontal embolic infarct and [id number] left frontal embolic infarct (per first MRI previous hemorrgaic cerebral ischemia)  . History of duodenal ulcer    1997  . History of Helicobacter pylori infection    1997  . History of hiatal hernia   . History of loop recorder    Current  . History of syncope    1997  and 2014  s/p  loop recorder  . Hyperlipidemia   . Hypertension   . Nocturia   . PAF (paroxysmal atrial fibrillation) [facility])    cardiologist-  dr [name]  . Peripheral neuropathy 05/04/2018  . Renal cell cancer ([place]) 09/06/2016  . Right renal mass   . Status post placement of implantable loop recorder    07-06-2012  .  Stroke [facility])    2013 2 mild strokes mild weakness bil sides  . Urgency of urination   . Wears hearing aid    right ear only    Past Surgical History:  Procedure Laterality Date  . APPENDECTOMY  age 35  . COLONOSCOPY  last one 07-17-2015  . CYSTOSCOPY/RETROGRADE/URETEROSCOPY Right 04/22/2016   Procedure: CYSTOSCOPY/RETROGRADE/URETEROSCOPY/ BIOPSY/ STENT PLACEMENT;  Surgeon: [name], MD;  Location: [facility];  Service: Urology;  Laterality: Right;  . ESOPHAGOGASTRODUODENOSCOPY  last one 09-16-2014  . LOOP RECORDER IMPLANT N/A 07/06/2012   Procedure: LOOP RECORDER IMPLANT;  Surgeon: [name], MD;   Location: [facility] CATH LAB;  Service: Cardiovascular;  Laterality: N/A;  . ROBOT ASSITED LAPAROSCOPIC NEPHROURETERECTOMY Right 05/13/2016   Procedure: XI ROBOT ASSITED LAPAROSCOPIC NEPHROURETERECTOMY;  Surgeon: [name], MD;  Location: [facility] ORS;  Service: Urology;  Laterality: Right;  . TEE WITHOUT CARDIOVERSION  12/19/2011   Procedure: TRANSESOPHAGEAL ECHOCARDIOGRAM (TEE);  Surgeon: [name], MD;  Location: [facility] ENDOSCOPY;  Service: Cardiovascular;  Laterality: N/A;  no evidence thrombus in the atrial cavity or appendage;  mild AV thickened w/ mild AI;  minimal fixed plaque in thoracic aorta;  mild MV thickened w/ mild MR;  NO pfo by color doppler or with injection of agitated saline  . TOTAL HIP ARTHROPLASTY Left 09/07/2017   Procedure: TOTAL HIP ARTHROPLASTY ANTERIOR APPROACH;  Surgeon: Rod Can, MD;  Location: [facility] ORS;  Service: Orthopedics;  Laterality: Left;  . TOTAL HIP ARTHROPLASTY Right 02/01/2018   Procedure: TOTAL HIP ARTHROPLASTY ANTERIOR APPROACH;  Surgeon: Rod Can, MD;  Location: [place];  Service: Orthopedics;  Laterality: Right;  . TRANSTHORACIC ECHOCARDIOGRAM  09-10-20103   grade 1 diastolic dysfunction,  ef 55-60%/  AV sclerosis without stenosis/  trivial TR  . WRIST FRACTURE SURGERY Right 2014  approx.   retained hardware     reports that he quit smoking about 53 years ago. His smoking use included cigarettes and pipe. He quit after 5.00 years of use. He has never used smokeless tobacco. He reports that he does not drink alcohol and does not use drugs.  Allergies  Allergen Reactions  . Acyclovir And Related Other (See Comments)    Unsure of what happens    Family History  Problem Relation Age of Onset  . Heart Problems Mother   . Brain cancer Father   . Parkinsonism Brother   . Dementia Brother   . Colon cancer Neg Hx   . Esophageal cancer Neg Hx   . Stomach cancer Neg Hx   . Gastric cancer Neg Hx   . Liver disease Neg Hx   . Kidney disease Neg Hx   .  Diabetes Neg Hx      Prior to Admission medications   Medication Sig Start Date End Date Taking? Authorizing Provider  B Complex Vitamins (VITAMIN B COMPLEX PO) Take 1 tablet by mouth daily. "high powered"   Yes [provider]  Cholecalciferol (VITAMIN D3) 5000 units CAPS Take 5,000 Units by mouth daily.    Yes [provider]  ELIQUIS 2.5 MG TABS tablet Take 1 tablet (2.5 mg total) by mouth 2 (two) times daily. 07/06/19  Yes Shirley Friar, PA-C  ferrous sulfate 325 (65 FE) MG tablet Take 325 mg by mouth daily with breakfast.   Yes [provider]  polyethylene glycol powder (GLYCOLAX/MIRALAX) 17 GM/SCOOP powder Take 17 g by mouth 2 (two) times daily as needed. Patient taking differently: Take  17 g by mouth 2 (two) times daily as needed for moderate constipation.  09/10/18  Yes Biagio Borg, MD  senna-docusate (SENOKOT-S) 8.6-50 MG tablet Take 1 tablet by mouth daily as needed for mild constipation.   Yes [provider]  sulfamethoxazole-trimethoprim (BACTRIM DS) 800-160 MG tablet Take 1 tablet by mouth 2 (two) times daily.  09/24/19  Yes [provider]  tamsulosin (FLOMAX) 0.4 MG CAPS capsule Take 1 capsule (0.4 mg total) by mouth daily after breakfast. 09/06/16  Yes Biagio Borg, MD    Physical Exam: Vitals:   09/24/19 1527 09/24/19 1533 09/24/19 1717 09/24/19 1829  BP:  139/78    Pulse:  77    Resp:  18    Temp:  98.5 F (36.9 C) 98.8 F (37.1 C) (!) 103 F (39.4 C)  TempSrc:  Oral Oral Oral  SpO2: 95% 97%      Constitutional: NAD, calm, comfortable, nontoxic appearing elderly male laying at 45 degree incline in bed and appears lethargic and asleep throughout evaluation Vitals:   09/24/19 1527 09/24/19 1533 09/24/19 1717 09/24/19 1829  BP:  139/78    Pulse:  77    Resp:  18    Temp:  98.5 F (36.9 C) 98.8 F (37.1 C) (!) 103 F (39.4 C)  TempSrc:  Oral Oral Oral  SpO2: 95% 97%     Eyes: PERRL, lids and conjunctivae  normal ENMT: Mucous membranes are moist.  Neck: normal, supple Respiratory: clear to auscultation bilaterally, no wheezing, no crackles. Normal respiratory effort. No accessory muscle use.  Cardiovascular: Regular rate with bigeminy, no murmurs / rubs / gallops. No extremity edema.   Abdomen: Difficult to assess for tenderness given dementia but appeared uncomfortable and restless during lower abdominal exam.  No masses palpated.  Bowel sounds positive.  Musculoskeletal: no clubbing / cyanosis. No joint deformity upper and lower extremities. Good ROM, no contractures. Normal muscle tone.  Skin: no rashes, lesions, ulcers. No induration Neurologic: Patient awakes easily to voice and light touch.  He is oriented only to self but lethargic and falls asleep easily.  He is able to follow simple commands like lifting his legs and hand grip. Psychiatric: Dementia.  Alert and oriented only to self.    Labs on Admission: I have personally reviewed following labs and imaging studies  CBC: Recent Labs  Lab 09/24/19 1547  WBC 10.9*  NEUTROABS 9.6*  HGB 13.1  HCT 39.5  MCV 98.5  PLT 979*   Basic Metabolic Panel: Recent Labs  Lab 09/24/19 1547  NA 139  K 3.5  CL 108  CO2 23  GLUCOSE 99  BUN 24*  CREATININE 1.55*  CALCIUM 7.6*   GFR: CrCl cannot be calculated (Unknown ideal weight.). Liver Function Tests: Recent Labs  Lab 09/24/19 1547  AST 16  ALT 18  ALKPHOS 43  BILITOT 1.1  PROT 5.6*  ALBUMIN 3.5   No results for input(s): LIPASE, AMYLASE in the last 168 hours. No results for input(s): AMMONIA in the last 168 hours. Coagulation Profile: No results for input(s): INR, PROTIME in the last 168 hours. Cardiac Enzymes: No results for input(s): CKTOTAL, CKMB, CKMBINDEX, TROPONINI in the last 168 hours. BNP (last 3 results) No results for input(s): PROBNP in the last 8760 hours. HbA1C: No results for input(s): HGBA1C in the last 72 hours. CBG: No results for input(s):  GLUCAP in the last 168 hours. Lipid Profile: No results for input(s): CHOL, HDL, LDLCALC, TRIG, CHOLHDL, LDLDIRECT in  the last 72 hours. Thyroid Function Tests: No results for input(s): TSH, T4TOTAL, FREET4, T3FREE, THYROIDAB in the last 72 hours. Anemia Panel: No results for input(s): VITAMINB12, FOLATE, FERRITIN, TIBC, IRON, RETICCTPCT in the last 72 hours. Urine analysis:    Component Value Date/Time   COLORURINE YELLOW 09/24/2019 1547   APPEARANCEUR HAZY (A) 09/24/2019 1547   LABSPEC 1.014 09/24/2019 1547   PHURINE 6.0 09/24/2019 1547   GLUCOSEU NEGATIVE 09/24/2019 1547   GLUCOSEU NEGATIVE 06/25/2019 1258   HGBUR SMALL (A) 09/24/2019 1547   BILIRUBINUR NEGATIVE 09/24/2019 1547   KETONESUR 5 (A) 09/24/2019 1547   PROTEINUR 30 (A) 09/24/2019 1547   UROBILINOGEN 0.2 06/25/2019 1258   NITRITE POSITIVE (A) 09/24/2019 1547   LEUKOCYTESUR LARGE (A) 09/24/2019 1547    Radiological Exams on Admission: DG Chest Port 1 View  Result Date: 09/24/2019 CLINICAL DATA:  Confusion and weakness EXAM: PORTABLE CHEST 1 VIEW COMPARISON:  01/29/2018, 09/22/2019 FINDINGS: Recording device over left chest wall. No focal airspace disease or effusion. Stable cardiomediastinal silhouette with aortic atherosclerosis. No pneumothorax. IMPRESSION: No active disease. Electronically Signed   By: Donavan Foil M.D.   On: 09/24/2019 16:05      Assessment/Plan  Sepsis secondary to UTI Continue IV Rocephin.  Urine culture/blood culture pending. Continue IV fluids 75cc/hr infusions  Continue to trend lactate  Acute metabolic encephalopathy secondary to UTI with history of dementia will have 1-to-1 sitter overnight for agitation apply condom catheter Baseline dementia-patient oriented to self and family  Thrombocytopenia   Appears to have been present in the past.  Stable.  Continue to monitor.  Hx of CVA On Eliquis  PAF Continue Eliquis   Chronic kidney disease stage IIIb with history of renal  cell cancer s/p right nephrectomy Creatinine stable.  Avoid nephrotoxic agent.  DVT prophylaxis: Eliquis Code Status: Full Family Communication: Plan discussed with daughter at bedside.  All questions and concerns were answered. disposition Plan: Home with at least 2 midnight stays  Consults called:  Admission status: inpatient  Status is: Inpatient  Remains inpatient appropriate because:IV treatments appropriate due to intensity of illness or inability to take PO   Dispo: The patient is from: Home              Anticipated d/c is to: Home              Anticipated d/c date is: 3 days              Patient currently is not medically stable to d/c.         Orene Desanctis DO [facility]   If 7PM-7AM, please contact night-coverage [email or web address]   09/24/2019, 6:49 PM

## 2019-09-25 LAB — CBC
HCT: 38.4 % — ABNORMAL LOW (ref 39.0–52.0)
Hemoglobin: 12.7 g/dL — ABNORMAL LOW (ref 13.0–17.0)
MCH: 32.5 pg (ref 26.0–34.0)
MCHC: 33.1 g/dL (ref 30.0–36.0)
MCV: 98.2 fL (ref 80.0–100.0)
Platelets: 85 10*3/uL — ABNORMAL LOW (ref 150–400)
RBC: 3.91 MIL/uL — ABNORMAL LOW (ref 4.22–5.81)
RDW: 13.1 % (ref 11.5–15.5)
WBC: 10.5 10*3/uL (ref 4.0–10.5)
nRBC: 0 % (ref 0.0–0.2)

## 2019-09-25 LAB — BASIC METABOLIC PANEL
Anion gap: 7 (ref 5–15)
BUN: 25 mg/dL — ABNORMAL HIGH (ref 8–23)
CO2: 22 mmol/L (ref 22–32)
Calcium: 8.1 mg/dL — ABNORMAL LOW (ref 8.9–10.3)
Chloride: 107 mmol/L (ref 98–111)
Creatinine, Ser: 1.82 mg/dL — ABNORMAL HIGH (ref 0.61–1.24)
GFR calc Af Amer: 39 mL/min — ABNORMAL LOW (ref >60)
GFR calc non Af Amer: 34 mL/min — ABNORMAL LOW (ref >60)
Glucose, Bld: 97 mg/dL (ref 70–99)
Potassium: 4.1 mmol/L (ref 3.5–5.1)
Sodium: 136 mmol/L (ref 135–145)

## 2019-09-25 LAB — LACTIC ACID, PLASMA: Lactic Acid, Venous: 1.1 mmol/L (ref 0.5–1.9)

## 2019-09-25 MED ORDER — LIP MEDEX EX OINT
TOPICAL_OINTMENT | CUTANEOUS | Status: AC
  Filled 2019-09-25: qty 7

## 2019-09-25 NOTE — Progress Notes (Signed)
PROGRESS NOTE   Jeffrey Hardy  UUV:[id number] DOB: [birth year] DOA: 09/24/2019 PCP: Biagio Borg, MD  Brief Narrative:  84 year old white male community dwelling Multi-infarct dementia-?  Vascular parkinsonism-??  Followed by Dr. Lavell Anchors of [facility] neurology  Also status post CVA posterior right frontal lobe 2013  Right foot drop since 01/2019 Followed by Dr. Adam Phenix status post [now nonfunctioning] ILR 06/2012 f PVCs bigeminy Benign liver cysts on ultrasound 2003 P A. fib CHADS2 score >5 on Eliquis Renal cancer status post right nephrectomy 10 05/16/2016 Dr. Nicolette Bang HTN HLD Hip fracture 01/2018  Admit 09/24/19 form [facility] ED with encephalopathy and increasing lethargy at home Found to be febrile 103 with agitation-in ED given 2 mg Haldol secondary to this WBC 10.9 creatinine 1.55    Assessment & Plan:   Principal Problem:   UTI (urinary tract infection) Active Problems:   Thrombocytopenia ([facility])   CKD (chronic kidney disease) stage 3, GFR 30-59 ml/min   Acute metabolic encephalopathy   Dementia with behavioral disturbance ([place])   Sepsis ([place])   1. Sepsis secondary to UTI a. Status post IV fluid boluses in the emergency room 6/18 b. Sepsis physiology is resolving well c. Continue IV fluid rate 75 cc/H d. Continue ceftriaxone daily and follow urine and blood cultures from admission 618 2. Toxic metabolic encephalopathy superimposed on advanced dementia/vascular Parkinson's/prior CVA and likely multi-infarct dementia a. Lethargy was presenting complaint-he is much more coherent today b. Able to tell me time place person president and can spell his daughter's name c. Outpatient follow-up with Dr. Lavell Anchors of neurology for further consideration of management 3. Paroxysmal atrial fibrillation on Eliquis CHADS2 score >5 status post ILR now nonfunctioning a. Continue Eliquis 2.5 twice daily b. Not currently on rate control c. Monitor show bigeminy and some PVCs-continue  monitors for 24 hours but discontinue if no further issue 4. Renal cancer status post right nephrectomy as above a. Continue Flomax 0.4 mg every morning 5. CKD stage IIIb a. Renal function is stable monitor lab trends a.m. 6. HTN a. No current medication 7. HLD no current medication 8. A1c 63.68 a. 84 years old-would not aggressively control glycemia given elevated mortality in this patient population b. Outpatient reconsideration per PCP  DVT prophylaxis: Eliquis Code Status: Full code Family Communication: Long discussion with the daughter can do at the bedside Disposition: At baseline patient is 1 assist at home-daughter tells me that they have a 9 to 1-week day caregiver Patient will need therapy evaluation this weekend to determine if supplementation of this is needed-patient aims to return home on discharge  Status is: Inpatient  Remains inpatient appropriate because:Hemodynamically unstable, Unsafe d/c plan, IV treatments appropriate due to intensity of illness or inability to take PO and Inpatient level of care appropriate due to severity of illness   Dispo: The patient is from: Home              Anticipated d/c is to: To be determined at this time unclear if family can manage him              Anticipated d/c date is: 3 days              Patient currently is not medically stable to d/c.       Consultants:   None  Procedures: None  Antimicrobials: Rocephin 6/18   Subjective: Pleasant coherent no distress loses track slightly of conversation but can orient well as above No complaints this morning other than  being a little bit cold   Objective: Vitals:   09/24/19 2054 09/25/19 0048 09/25/19 0434 09/25/19 0632  BP:  103/73 (!) 157/127 (!) 104/49  Pulse:  76 80 83  Resp:      Temp:  98.7 F (37.1 C) 98.3 F (36.8 C) 97.6 F (36.4 C)  TempSrc:  Oral Oral Oral  SpO2:  98% 96% 100%  Weight: 90 kg     Height: 6\' 1"  (1.854 m)      No intake or output data in  the 24 hours ending 09/25/19 0823 Filed Weights   09/24/19 2054  Weight: 90 kg    Examination:  General exam: EOMI NCAT moderate dentition some caries Mallampati 4 No JVD Neck soft supple Arcus senilis Respiratory system: Clinically clear no rales rhonchi or adventitious sounds lower lung fields Cardiovascular system: S1-S2 slightly bradycardic at times Gastrointestinal system: Soft nontender no rebound no guarding no CVA tenderness. Central nervous system: Neurologically intact moving 4 limbs grossly equally without deficit Extremities: Soft nontender Skin: No rash Psychiatry: Euthymic congruent  Data Reviewed: I have personally reviewed following labs and imaging studies BUN/creatinine 24/1.55-->25/1.82 Lactic acid 5.6-->1.1 WBC 10.9-->10.5 Hemoglobin 12.7 Platelet count 100-->85   Radiology Studies: DG Chest Port 1 View  Result Date: 09/24/2019 CLINICAL DATA:  Confusion and weakness EXAM: PORTABLE CHEST 1 VIEW COMPARISON:  01/29/2018, 09/22/2019 FINDINGS: Recording device over left chest wall. No focal airspace disease or effusion. Stable cardiomediastinal silhouette with aortic atherosclerosis. No pneumothorax. IMPRESSION: No active disease. Electronically Signed   By: Donavan Foil M.D.   On: 09/24/2019 16:05     Scheduled Meds: . apixaban  2.5 mg Oral BID  . cholecalciferol  5,000 Units Oral Daily  . ferrous sulfate  325 mg Oral Q breakfast  . tamsulosin  0.4 mg Oral QPC breakfast   Continuous Infusions: . sodium chloride 75 mL/hr at 09/24/19 2112  . cefTRIAXone (ROCEPHIN)  IV       LOS: 1 day    Time spent: [place], MD [facility] To contact the attending provider between 7A-7P or the covering provider during after hours 7P-7A, please log into the web site [email or web address] and access using universal [place] password for that web site. If you do not have the password, please call the hospital operator.  09/25/2019, 8:23 AM

## 2019-09-26 ENCOUNTER — Inpatient Hospital Stay (HOSPITAL_COMMUNITY): Payer: PPO

## 2019-09-26 LAB — COMPREHENSIVE METABOLIC PANEL
ALT: 19 U/L (ref 0–44)
AST: 23 U/L (ref 15–41)
Albumin: 3.1 g/dL — ABNORMAL LOW (ref 3.5–5.0)
Alkaline Phosphatase: 38 U/L (ref 38–126)
Anion gap: 10 (ref 5–15)
BUN: 28 mg/dL — ABNORMAL HIGH (ref 8–23)
CO2: 19 mmol/L — ABNORMAL LOW (ref 22–32)
Calcium: 8.1 mg/dL — ABNORMAL LOW (ref 8.9–10.3)
Chloride: 108 mmol/L (ref 98–111)
Creatinine, Ser: 1.89 mg/dL — ABNORMAL HIGH (ref 0.61–1.24)
GFR calc Af Amer: 38 mL/min — ABNORMAL LOW (ref >60)
GFR calc non Af Amer: 33 mL/min — ABNORMAL LOW (ref >60)
Glucose, Bld: 94 mg/dL (ref 70–99)
Potassium: 3.8 mmol/L (ref 3.5–5.1)
Sodium: 137 mmol/L (ref 135–145)
Total Bilirubin: 0.7 mg/dL (ref 0.3–1.2)
Total Protein: 5.3 g/dL — ABNORMAL LOW (ref 6.5–8.1)

## 2019-09-26 LAB — CBC WITH DIFFERENTIAL/PLATELET
Abs Immature Granulocytes: 0.02 10*3/uL (ref 0.00–0.07)
Basophils Absolute: 0 10*3/uL (ref 0.0–0.1)
Basophils Relative: 0 %
Eosinophils Absolute: 0.1 10*3/uL (ref 0.0–0.5)
Eosinophils Relative: 3 %
HCT: 36.5 % — ABNORMAL LOW (ref 39.0–52.0)
Hemoglobin: 12.2 g/dL — ABNORMAL LOW (ref 13.0–17.0)
Immature Granulocytes: 0 %
Lymphocytes Relative: 8 %
Lymphs Abs: 0.4 10*3/uL — ABNORMAL LOW (ref 0.7–4.0)
MCH: 32.6 pg (ref 26.0–34.0)
MCHC: 33.4 g/dL (ref 30.0–36.0)
MCV: 97.6 fL (ref 80.0–100.0)
Monocytes Absolute: 0.4 10*3/uL (ref 0.1–1.0)
Monocytes Relative: 9 %
Neutro Abs: 3.7 10*3/uL (ref 1.7–7.7)
Neutrophils Relative %: 80 %
Platelets: 71 10*3/uL — ABNORMAL LOW (ref 150–400)
RBC: 3.74 MIL/uL — ABNORMAL LOW (ref 4.22–5.81)
RDW: 13.2 % (ref 11.5–15.5)
WBC: 4.6 10*3/uL (ref 4.0–10.5)
nRBC: 0 % (ref 0.0–0.2)

## 2019-09-26 MED ORDER — SODIUM CHLORIDE 0.9 % IV SOLN
3.0000 g | Freq: Four times a day (QID) | INTRAVENOUS | Status: DC
  Administered 2019-09-26 – 2019-09-27 (×5): 3 g via INTRAVENOUS
  Filled 2019-09-26: qty 3
  Filled 2019-09-26: qty 8
  Filled 2019-09-26 (×2): qty 3
  Filled 2019-09-26: qty 8
  Filled 2019-09-26: qty 3

## 2019-09-26 NOTE — Evaluation (Signed)
Physical Therapy Evaluation Patient Details Name: Jeffrey Hardy MRN: [id number] DOB: December 13, 1935 Today's Date: 09/26/2019   History of Present Illness  84 yo male admitted with UTI, sepsis. Hx of CVA, vascular dementia, CKD, A fib, R THA 2019, post op anemia, renal cell ca  Clinical Impression  On eval, pt required Mod assist +2 for safety for mobility. He walked ~75 feet with a RW. Pt presents with general weakness, decreased activity tolerance, and impaired gait and balance. He is currently a fairly heavy Mod assist for mobility. Family will need to decide if they feel they can provide that level of care for pt at home. He could benefit from Branch rehab at SNF if they are agreeable. Will continue to follow and progress activity as tolerated.     Follow Up Recommendations SNF (vs HHPT and 24 hour supervision/assist (depends on family preference)    Equipment Recommendations  None recommended by PT    Recommendations for Other Services       Precautions / Restrictions Precautions Precautions: Fall Precaution Comments: incontinent Restrictions Weight Bearing Restrictions: No      Mobility  Bed Mobility Overal bed mobility: Needs Assistance Bed Mobility: Supine to Sit     Supine to sit: Mod assist;HOB elevated     General bed mobility comments: Assist for trunk and bil LEs. Increased time. Multimodal cueing required. Utilized bedpad to aid with scooting.  Transfers Overall transfer level: Needs assistance Equipment used: Rolling walker (2 wheeled) Transfers: Sit to/from Stand Sit to Stand: Mod assist;From elevated surface         General transfer comment: Assist to power up, stabilize, control descent. Vcs for safety, technique. Observed wide BOS and a mild posterior bias with initial standing.  Ambulation/Gait Ambulation/Gait assistance: Min assist;+2 safety/equipment Gait Distance (Feet): 75 Feet Assistive device: Rolling walker (2 wheeled) Gait Pattern/deviations:  Step-through pattern;Decreased stride length     General Gait Details: Assist to stabilize pt throughout ambulation distance. Followed with recliner for safety. Pt tolerated distance well.  Stairs            Wheelchair Mobility    Modified Rankin (Stroke Patients Only)       Balance Overall balance assessment: Needs assistance;History of Falls         Standing balance support: Bilateral upper extremity supported Standing balance-Leahy Scale: Poor                               Pertinent Vitals/Pain Pain Assessment: No/denies pain    Home Living Family/patient expects to be discharged to:: Unsure Living Arrangements: Spouse/significant other;Children Available Help at Discharge: Available 24 hours/day;Family;Personal care attendant (aide 9-1) Type of Home: House Home Access: Stairs to enter Entrance Stairs-Rails: Right Entrance Stairs-Number of Steps: 3 Home Layout: One level Home Equipment: Walker - 2 wheels;Cane - quad;Bedside commode;Wheelchair - Banker (lift chair)      Prior Function Level of Independence: Needs assistance   Gait / Transfers Assistance Needed: household ambulator with RW  ADL's / Homemaking Assistance Needed: assist for ADLs        Hand Dominance        Extremity/Trunk Assessment   Upper Extremity Assessment Upper Extremity Assessment: Defer to OT evaluation    Lower Extremity Assessment Lower Extremity Assessment: Generalized weakness    Cervical / Trunk Assessment Cervical / Trunk Assessment: Normal  Communication   Communication: No difficulties  Cognition Arousal/Alertness: Awake/alert Behavior During Therapy: [facility]  for tasks assessed/performed Overall Cognitive Status: History of cognitive impairments - at baseline                                        General Comments      Exercises     Assessment/Plan    PT Assessment Patient needs continued PT services  PT  Problem List Decreased strength;Decreased mobility;Decreased balance;Decreased activity tolerance;Decreased knowledge of use of DME;Decreased safety awareness;Decreased cognition       PT Treatment Interventions DME instruction;Gait training;Therapeutic activities;Therapeutic exercise;Patient/family education;Balance training;Functional mobility training    PT Goals (Current goals can be found in the Care Plan section)  Acute Rehab PT Goals Patient Stated Goal: get stronger and return home PT Goal Formulation: With patient/family Time For Goal Achievement: 10/10/19 Potential to Achieve Goals: Good    Frequency Min 3X/week   Barriers to discharge        Co-evaluation               AM-PAC PT "6 Clicks" Mobility  Outcome Measure Help needed turning from your back to your side while in a flat bed without using bedrails?: A Lot Help needed moving from lying on your back to sitting on the side of a flat bed without using bedrails?: A Lot Help needed moving to and from a bed to a chair (including a wheelchair)?: A Lot Help needed standing up from a chair using your arms (e.g., wheelchair or bedside chair)?: A Lot Help needed to walk in hospital room?: A Little Help needed climbing 3-5 steps with a railing? : A Lot 6 Click Score: 13    End of Session Equipment Utilized During Treatment: Gait belt Activity Tolerance: Patient tolerated treatment well Patient left: in chair;with call bell/phone within reach;with chair alarm set;with nursing/sitter in room   PT Visit Diagnosis: Muscle weakness (generalized) (M62.81);History of falling (Z91.81);Unsteadiness on feet (R26.81)    Time: 5638-7564 PT Time Calculation (min) (ACUTE ONLY): 36 min   Charges:   PT Evaluation $PT Eval Low Complexity: 1 Low PT Treatments $Gait Training: 8-22 mins          Doreatha Massed, PT Acute Rehabilitation  Office: [phone] Pager: [phone]

## 2019-09-26 NOTE — Evaluation (Addendum)
SLP Cancellation Note  Patient Details Name: [name] MRN: [id number] DOB: 1936-02-19   Cancelled treatment:       Reason Eval/Treat Not Completed: Other (comment) (order for swallow eval received, SLP to see early am on 6/21; apologize unable to see pt today)   Pt has undergone endoscopies in the past - 2016 findings included "GERD with esophagitis and early stricture . No dilation due to active esophagitis."  Pt also found to have a hiatal hernia.  Omeprazole 20 mg daily prescribed and anti-reflux measures advised.   Jeffrey Lime, MS [facility] SLP Acute Rehab Services Office [phone]  Macario Golds 09/26/2019, 5:36 PM

## 2019-09-26 NOTE — Progress Notes (Signed)
CN/AC made aware of SA sitter need, family at bedside at present and safety precautions implemented SRP, RN

## 2019-09-26 NOTE — Progress Notes (Signed)
PROGRESS NOTE   Jeffrey Hardy  KGU:[id number] DOB: Feb 02, 1936 DOA: 09/24/2019 PCP: Biagio Borg, MD  Brief Narrative:  84 year old white male community dwelling Multi-infarct dementia-?  Vascular parkinsonism-??  Followed by Dr. Lavell Anchors of [facility] neurology  Also status post CVA posterior right frontal lobe 2013  Right foot drop since 01/2019 Followed by Dr. Adam Phenix status post [now nonfunctioning] ILR 06/2012 f PVCs bigeminy Benign liver cysts on ultrasound 2003 P A. fib CHADS2 score >5 on Eliquis Renal cancer status post right nephrectomy 10 05/16/2016 Dr. Nicolette Bang HTN HLD Hip fracture 01/2018  Admit 09/24/19 form [facility] ED with encephalopathy and increasing lethargy at home Found to be febrile 103 with agitation-in ED given 2 mg Haldol secondary to this WBC 10.9 creatinine 1.55    Assessment & Plan:   Principal Problem:   UTI (urinary tract infection) Active Problems:   Thrombocytopenia (HCC)   CKD (chronic kidney disease) stage 3, GFR 30-59 ml/min   Acute metabolic encephalopathy   Dementia with behavioral disturbance ([place])   Sepsis ([place])   1. Sepsis > Aspiration than UTI a. 2 vw CXR 6/20 my overread=Confluences in RUL and RLLL b. Will ask SLP to see, change diet to Dys 2 in attendant the sam c.    ? 75 cc/H-->40 cc/h d. Low grade temp this am e. Switch to Unasyn-d/c ceftriaxone f. Cbc + diff am 2. Thrombocytopenia a. Likely secondary to sepsis on admission superimposed on underlying cancer state (although has had nephrectomy) b. Baseline seems to be anywhere from 100-1 50 and expect this will improve with continued treatment c. If CBC plus differential shows a further drop will need to discuss-is on Eliquis 3. Toxic metabolic encephalopathy superimposed on advanced dementia/vascular Parkinson's/prior CVA and likely multi-infarct dementia a. more coherent  b. Needs safety sitter c. Outpatient follow-up with Dr. Lavell Anchors of neurology for further consideration  of management 4. Paroxysmal atrial fibrillation on Eliquis CHADS2 score >5 status post ILR now nonfunctioning a. Continue Eliquis 2.5 twice daily b. Not currently on rate control c. D/c monitors am if stable 5. Renal cancer status post right nephrectomy as above a. Continue Flomax 0.4 mg every morning 6. CKD stage IIIb a. Renal function is stable monitor lab trends a.m. 7. HTN a. No current medication 8. HLD no current medication 9. A1c 46.92 a. 84 years old-would not aggressively control glycemia given elevated mortality in this patient population b. Outpatient reconsideration per PCP  DVT prophylaxis: Eliquis Code Status: Full code Family Communication: Long discussion with the daughter wife bedside in person 6/20 Disposition: At baseline patient is 1 assist at home-daughter tells me that they have a 21 to 1, week day caregiver Patient will need therapy evaluation this weekend to determine if supplementation of this is needed-patient aims to return home on discharge  Status is: Inpatient  Remains inpatient appropriate because:Hemodynamically unstable, Unsafe d/c plan, IV treatments appropriate due to intensity of illness or inability to take PO and Inpatient level of care appropriate due to severity of illness   Dispo: The patient is from: Home              Anticipated d/c is to: To be determined at this time unclear if family can manage him              Anticipated d/c date is: 3 days              Patient currently is not medically stable to d/c.  Consultants:   None  Procedures: None  Antimicrobials: Rocephin 6/18   Subjective: In good spirits Had a full breakfast according to family Family members all at bedside He becomes somewhat labile at times but seems to be improved from prior Note low-grade temp overnight No chest pain fever chills   Objective: Vitals:   09/25/19 1412 09/25/19 1751 09/25/19 2206 09/26/19 0630  BP: 114/71 117/68 115/80 112/67   Pulse: 73 85  (!) 56  Resp: 18 (!) 22  18  Temp: 98.1 F (36.7 C) (!) 101.1 F (38.4 C) 100.2 F (37.9 C) 98.6 F (37 C)  TempSrc: Oral  Rectal Rectal  SpO2: 94% 99% 97% 99%  Weight:      Height:        Intake/Output Summary (Last 24 hours) at 09/26/2019 1135 Last data filed at 09/26/2019 1000 Gross per 24 hour  Intake 240 ml  Output 600 ml  Net -360 ml   Filed Weights   09/24/19 2054  Weight: 90 kg    Examination:  General exam: EOMI NCAT  No JVD Neck soft supple Arcus senilis Respiratory system: No rhonchi rales Cardiovascular system: S1-S2 slightly bradycardic at times with PVCs on monitors which is chronic Gastrointestinal system: Soft nontender no rebound Central nervous system: Neurologically intact moving 4 limbs grossly equally without deficit Extremities: Soft nontender Skin: No rash Psychiatry: Euthymic congruent  Data Reviewed: I have personally reviewed following labs and imaging studies BUN/creatinine 24/1.55-->25/1.82-->28/1.8 Lactic acid 5.6-->1.1 WBC 10.9-->10.5-->4.6 Hemoglobin 12.7-->12.2 Platelet count 100-->85-->71   Radiology Studies: DG Chest Port 1 View  Result Date: 09/24/2019 CLINICAL DATA:  Confusion and weakness EXAM: PORTABLE CHEST 1 VIEW COMPARISON:  01/29/2018, 09/22/2019 FINDINGS: Recording device over left chest wall. No focal airspace disease or effusion. Stable cardiomediastinal silhouette with aortic atherosclerosis. No pneumothorax. IMPRESSION: No active disease. Electronically Signed   By: Donavan Foil M.D.   On: 09/24/2019 16:05     Scheduled Meds: . apixaban  2.5 mg Oral BID  . cholecalciferol  5,000 Units Oral Daily  . ferrous sulfate  325 mg Oral Q breakfast  . tamsulosin  0.4 mg Oral QPC breakfast   Continuous Infusions: . sodium chloride 75 mL/hr at 09/25/19 2307  . ampicillin-sulbactam (UNASYN) IV       LOS: 2 days    Time spent: [place], MD [facility] To contact the attending  provider between 7A-7P or the covering provider during after hours 7P-7A, please log into the web site [email or web address] and access using universal [place] password for that web site. If you do not have the password, please call the hospital operator.  09/26/2019, 11:35 AM

## 2019-09-26 NOTE — Plan of Care (Signed)
°  Problem: Fluid Volume: °Goal: Hemodynamic stability will improve °Outcome: Progressing °  °Problem: Clinical Measurements: °Goal: Diagnostic test results will improve °Outcome: Progressing °Goal: Signs and symptoms of infection will decrease °Outcome: Progressing °  °

## 2019-09-26 NOTE — Progress Notes (Addendum)
Pharmacy Antibiotic Note  Jeffrey Hardy is a 84 y.o. male admitted on 09/24/2019 with increased weakness & confusion.  Pharmacy has been consulted for Unasyn dosing for aspiration PNA. 09/26/2019 On CTX for UTI, to broaden to Unasyn to cover aspiration PNA UCx > 100K GNR. WBC WNL, Tmax 101.1, currently AF SCr up to 1.89, CrCl ~ 35 ml/min  Plan: Unasyn 3 gm IV q6h F/u renal fxn, culture data  Height: 6\' 1"  (185.4 cm) Weight: 90 kg (198 lb 6.6 oz) IBW/kg (Calculated) : 79.9  Temp (24hrs), Avg:99.5 F (37.5 C), Min:98.1 F (36.7 C), Max:101.1 F (38.4 C)  Recent Labs  Lab 09/24/19 1547 09/24/19 1759 09/24/19 2306 09/25/19 0109 09/26/19 0425  WBC 10.9*  --   --  10.5 4.6  CREATININE 1.55*  --   --  1.82* 1.89*  LATICACIDVEN  --  5.6* 0.9 1.1  --     Estimated Creatinine Clearance: 34.6 mL/min (A) (by C-G formula based on SCr of 1.89 mg/dL (H)).    Allergies  Allergen Reactions  . Acyclovir And Related Other (See Comments)    Unsure of what happens    Antimicrobials this admission: 6/20 Unasyn>> 6/18 CTX>>6/20 Dose adjustments this admission:  Microbiology results: 6/18 BCx2: ngtd 6/18 UCx: > 100 K GNR  Thank you for allowing pharmacy to be a part of this patient's care.  Eudelia Bunch, Pharm.D 09/26/2019 11:37 AM

## 2019-09-27 ENCOUNTER — Inpatient Hospital Stay (HOSPITAL_COMMUNITY): Payer: PPO

## 2019-09-27 LAB — COMPREHENSIVE METABOLIC PANEL
ALT: 25 U/L (ref 0–44)
AST: 29 U/L (ref 15–41)
Albumin: 3.5 g/dL (ref 3.5–5.0)
Alkaline Phosphatase: 48 U/L (ref 38–126)
Anion gap: 11 (ref 5–15)
BUN: 27 mg/dL — ABNORMAL HIGH (ref 8–23)
CO2: 20 mmol/L — ABNORMAL LOW (ref 22–32)
Calcium: 8.3 mg/dL — ABNORMAL LOW (ref 8.9–10.3)
Chloride: 103 mmol/L (ref 98–111)
Creatinine, Ser: 1.84 mg/dL — ABNORMAL HIGH (ref 0.61–1.24)
GFR calc Af Amer: 39 mL/min — ABNORMAL LOW (ref >60)
GFR calc non Af Amer: 34 mL/min — ABNORMAL LOW (ref >60)
Glucose, Bld: 106 mg/dL — ABNORMAL HIGH (ref 70–99)
Potassium: 3.6 mmol/L (ref 3.5–5.1)
Sodium: 134 mmol/L — ABNORMAL LOW (ref 135–145)
Total Bilirubin: 0.7 mg/dL (ref 0.3–1.2)
Total Protein: 5.9 g/dL — ABNORMAL LOW (ref 6.5–8.1)

## 2019-09-27 LAB — CBC WITH DIFFERENTIAL/PLATELET
Abs Immature Granulocytes: 0.03 10*3/uL (ref 0.00–0.07)
Basophils Absolute: 0 10*3/uL (ref 0.0–0.1)
Basophils Relative: 0 %
Eosinophils Absolute: 0.1 10*3/uL (ref 0.0–0.5)
Eosinophils Relative: 1 %
HCT: 40.6 % (ref 39.0–52.0)
Hemoglobin: 13.8 g/dL (ref 13.0–17.0)
Immature Granulocytes: 0 %
Lymphocytes Relative: 5 %
Lymphs Abs: 0.4 10*3/uL — ABNORMAL LOW (ref 0.7–4.0)
MCH: 32.8 pg (ref 26.0–34.0)
MCHC: 34 g/dL (ref 30.0–36.0)
MCV: 96.4 fL (ref 80.0–100.0)
Monocytes Absolute: 0.6 10*3/uL (ref 0.1–1.0)
Monocytes Relative: 8 %
Neutro Abs: 6.2 10*3/uL (ref 1.7–7.7)
Neutrophils Relative %: 86 %
Platelets: 94 10*3/uL — ABNORMAL LOW (ref 150–400)
RBC: 4.21 MIL/uL — ABNORMAL LOW (ref 4.22–5.81)
RDW: 13.1 % (ref 11.5–15.5)
WBC: 7.4 10*3/uL (ref 4.0–10.5)
nRBC: 0 % (ref 0.0–0.2)

## 2019-09-27 LAB — URINE CULTURE: Culture: >=100000 — AB

## 2019-09-27 MED ORDER — CAMPHOR-MENTHOL 0.5-0.5 % EX LOTN
TOPICAL_LOTION | CUTANEOUS | Status: DC | PRN
  Filled 2019-09-27: qty 222

## 2019-09-27 MED ORDER — ERYTHROMYCIN 5 MG/GM OP OINT
TOPICAL_OINTMENT | Freq: Three times a day (TID) | OPHTHALMIC | Status: DC
  Administered 2019-09-28: 1 via OPHTHALMIC
  Filled 2019-09-27: qty 1

## 2019-09-27 MED ORDER — AMOXICILLIN-POT CLAVULANATE 875-125 MG PO TABS
1.0000 | ORAL_TABLET | Freq: Two times a day (BID) | ORAL | Status: DC
  Administered 2019-09-27 – 2019-09-28 (×2): 1 via ORAL
  Filled 2019-09-27 (×2): qty 1

## 2019-09-27 NOTE — Evaluation (Addendum)
Clinical/Bedside Swallow Evaluation Patient Details  Name: Jeffrey Hardy MRN: [id number] Date of Birth: [birth year]  Today's Date: 09/27/2019 Time: SLP Start Time (ACUTE ONLY): 0750 SLP Stop Time (ACUTE ONLY): 0836 SLP Time Calculation (min) (ACUTE ONLY): 46 min  Past Medical History:  Past Medical History:  Diagnosis Date  . Anxiety   . Bilateral renal cysts   . Cancer ([place])    renal mass  . Dehydration 08/2018  . Depression   . Diverticulosis of colon   . Erectile dysfunction   . Hematuria   . History of cerebral embolic infarction x2 CVA's ---  neurologist-  [name]--- per pt residual abnormal gait , does need cane   12-16-2011  right frontal embolic infarct and [id number] left frontal embolic infarct (per first MRI previous hemorrgaic cerebral ischemia)  . History of duodenal ulcer    1997  . History of Helicobacter pylori infection    1997  . History of hiatal hernia   . History of loop recorder    Current  . History of syncope    1997  and 2014  s/p  loop recorder  . Hyperlipidemia   . Hypertension   . Nocturia   . PAF (paroxysmal atrial fibrillation) [facility])    cardiologist-  dr [name]  . Peripheral neuropathy 05/04/2018  . Renal cell cancer ([place]) 09/06/2016  . Right renal mass   . Status post placement of implantable loop recorder    07-06-2012  . Stroke [facility])    2013 2 mild strokes mild weakness bil sides  . Urgency of urination   . Wears hearing aid    right ear only   Past Surgical History:  Past Surgical History:  Procedure Laterality Date  . APPENDECTOMY  age 73  . COLONOSCOPY  last one 07-17-2015  . CYSTOSCOPY/RETROGRADE/URETEROSCOPY Right 04/22/2016   Procedure: CYSTOSCOPY/RETROGRADE/URETEROSCOPY/ BIOPSY/ STENT PLACEMENT;  Surgeon: [name], MD;  Location: [facility];  Service: Urology;  Laterality: Right;  . ESOPHAGOGASTRODUODENOSCOPY  last one 09-16-2014  . LOOP RECORDER IMPLANT N/A 07/06/2012   Procedure: LOOP RECORDER  IMPLANT;  Surgeon: [name], MD;  Location: [facility] CATH LAB;  Service: Cardiovascular;  Laterality: N/A;  . ROBOT ASSITED LAPAROSCOPIC NEPHROURETERECTOMY Right 05/13/2016   Procedure: XI ROBOT ASSITED LAPAROSCOPIC NEPHROURETERECTOMY;  Surgeon: [name], MD;  [facility];  Service: Urology;  Laterality: Right;  . TEE WITHOUT CARDIOVERSION  12/19/2011   Procedure: TRANSESOPHAGEAL ECHOCARDIOGRAM (TEE);  Surgeon: [name], MD;  Location[facility] ENDOSCOPY;  Service: Cardiovascular;  Laterality: N/A;  no evidence thrombus in the atrial cavity or appendage;  mild AV thickened w/ mild AI;  minimal fixed plaque in thoracic aorta;  mild MV thickened w/ mild MR;  NO pfo by color doppler or with injection of agitated saline  . TOTAL HIP ARTHROPLASTY Left 09/07/2017   Procedure: TOTAL HIP ARTHROPLASTY ANTERIOR APPROACH;  Surgeon: Rod Can, MD;  Location[facility];  Service: Orthopedics;  Laterality: Left;  . TOTAL HIP ARTHROPLASTY Right 02/01/2018   Procedure: TOTAL HIP ARTHROPLASTY ANTERIOR APPROACH;  Surgeon: Rod Can, MD;  Location: [place];  Service: Orthopedics;  Laterality: Right;  . TRANSTHORACIC ECHOCARDIOGRAM  09-10-20103   grade 1 diastolic dysfunction,  ef 55-60%/  AV sclerosis without stenosis/  trivial TR  . WRIST FRACTURE SURGERY Right 2014  approx.   retained hardware   HPI:  84 yo male adm to [facility] with AMS, found to have UTI.  Pt has dementia and h/o esophagitis  with early stricturing diagnosed in 2016 by Dr Henrene Pastor.  He has not had pneumonias nor unintended weight loss per daughter.  He does cough on occasion with po intake - more with liquids than solids and typically a little furhter into his meal per pt's wife and daughter.  Vomiting reported in ED but daughter states it was a minimal amount.  Swallow evaluation ordered.   Assessment / Plan / Recommendation Clinical Impression  Pt presents with negative CN exam - Soft palate with minimal erythema but pt denies pain.  Pt  easily passed 3 ounce Yale water test.  He does have belching at baseline and is coughing intermittent on secretions (x3) - inhalation x1 when laughing and 2 other occasions - not associated with po intake .  Rapid rate of intake noted with pt benefiting from cues to slow rate and clear oral cavity before taking more.    Pt observed with intake of cracker, applesauce and water via cup/straw.  Cough x1 noted post-swallow of liquids via straw *daughter reports family does not use straws at home. Cough likely due to premature spillage of water over orally retained food.  SLP advised consider using puree to orally transit masticated solids in lieu of liquids.  No further episodes of coughing noted.  Occasional belching and hiccuping observed during session - and given h/o esophageal dysphagia- early stricturing and esophagitis 2016- recommmend consider esophagram to rule out esophageal source.   SLP reviewed in detail compensation strategies, pH neutral status of water making to safest to consume, and gustatory changes with progressive dementia.  3 pillars of pna risk also reviewed - of which pt has one risk factor (dysphagia) but no immune compromise or poor oral health.    Recommend advance to dys3/thin diet - to allow more cohesive foods due to concern for premature loss of particulate matter.  Of note, SLP spoke to pt's wife via cell phone. She admits pt has been coughing a few times during meals over the last few weeks - with liquids more than solids- and often further into meals.  He does not expectorate foods.  She denies this being a concern.     All education completed, no follow up needed.   SLP Visit Diagnosis: Dysphagia, unspecified (R13.10)    Aspiration Risk  Mild aspiration risk    Diet Recommendation Dysphagia 3 (Mech soft);Thin liquid   Liquid Administration via: Cup;No straw Medication Administration:  (as tolerated, use puree if helpful to decrease aspiration risk) Supervision:  Patient able to self feed Compensations: Slow rate;Small sips/bites (start intake with liquids) Postural Changes: Seated upright at 90 degrees;Remain upright for at least 30 minutes after po intake    Other  Recommendations Oral Care Recommendations: Oral care BID   Follow up Recommendations None      Frequency and Duration     n/a       Prognosis   n/a     Swallow Study   General Date of Onset: 09/27/19 HPI: 84 yo male adm to [facility] with AMS, found to have UTI.  Pt has dementia and h/o esophagitis with early stricturing diagnosed in 2016 by Dr Henrene Pastor.  He has not had pneumonias nor unintended weight loss per daughter.  He does cough on occasion with po intake - more with liquids than solids and typically a little furhter into his meal per pt's wife and daughter.  Vomiting reported in ED but daughter states it was a minimal amount.  Swallow evaluation ordered. Type of  Study: Bedside Swallow Evaluation Previous Swallow Assessment: endoscopy Diet Prior to this Study: Dysphagia 2 (chopped);Thin liquids Temperature Spikes Noted: No Respiratory Status: Room air History of Recent Intubation: No Behavior/Cognition: Alert;Cooperative;Pleasant mood Oral Cavity Assessment: Within Functional Limits Oral Care Completed by SLP: No (daughter reports pt needs encouragement for dental brushing) Oral Cavity - Dentition: Adequate natural dentition Vision: Functional for self-feeding Self-Feeding Abilities: Able to feed self Patient Positioning: Upright in bed Baseline Vocal Quality: Normal Volitional Cough: Strong Volitional Swallow: Unable to elicit    Oral/Motor/Sensory Function Overall Oral Motor/Sensory Function: Within functional limits (posterior oral cavity with minimal erythema)   Ice Chips Ice chips: Within functional limits Presentation: Spoon   Thin Liquid Thin Liquid: Within functional limits Other Comments: pt passed 3 ounce Yale water test, cough x1 noted during meal after swallow  of thin - suspect occasional premature spillage of liquid into pharynx over solids allowing episodic aspiration; educated daughter to mitigation strategies    Nectar Thick Nectar Thick Liquid: Not tested   Honey Thick Honey Thick Liquid: Not tested   Puree Puree: Within functional limits Presentation: Self Fed;Spoon   Solid     Solid: Within functional limits Presentation: Self Fredirick Lathe 09/27/2019,9:21 AM  Kathleen Lime, MS [place] Office [phone] pager

## 2019-09-27 NOTE — TOC Progression Note (Signed)
Transition of Care [facility]) - Progression Note    Patient Details  Name: Jeffrey Hardy MRN: [id number] Date of Birth: Oct 19, 1935  [facility] CM/SW Contact  Purcell Mouton, RN Phone Number: 09/27/2019, 10:22 AM  Clinical Narrative:      Spoke with pt's daughter at bedside concerning disposition. Daughter states that her mother will be here shortly to hear everything. Explained that CM will return to the room or call into the room.        Expected Discharge Plan and Services                                                 Social Determinants of Health (SDOH) Interventions    Readmission Risk Interventions No flowsheet data found.

## 2019-09-27 NOTE — TOC Progression Note (Signed)
Transition of Care [facility]) - Progression Note    Patient Details  Name: Jeffrey Hardy MRN: [id number] Date of Birth: 02-06-1936  Transition of Care [facility]) CM/SW Contact  Purcell Mouton, RN Phone Number: 09/27/2019, 2:07 PM  Clinical Narrative:    Spoke with Mrs. Cordone and daughter in length concerning discharge plans. Pt is active with [facility] for PCS. Mrs. Bayona want to take pt home related to the pt's mind  home being familiar with people and place. Encouraged Mrs. Iafrate and her daughter to think about Memory Care Units. Mrs. Foos states that [place] has a memory care unit. Mrs. Palmieri states that she would like [place]. Daughter agreed with getting more time from [place] sitters.   Encouraged nboth wife and daughter once at home the PCP will need to be contracted for FL2.        Expected Discharge Plan and Services                                                 Social Determinants of Health (SDOH) Interventions    Readmission Risk Interventions No flowsheet data found.

## 2019-09-27 NOTE — Progress Notes (Signed)
PROGRESS NOTE   Jeffrey Hardy  MGN:[id number] DOB: [birth year] DOA: 09/24/2019 PCP: Biagio Borg, MD  Brief Narrative:  84 year old white male community dwelling Multi-infarct dementia-?  Vascular parkinsonism-??  Followed by Dr. Lavell Anchors of [facility] neurology  Also status post CVA posterior right frontal lobe 2013  Right foot drop since 01/2019 Followed by Dr. Adam Phenix status post [now nonfunctioning] ILR 06/2012 f PVCs bigeminy Benign liver cysts on ultrasound 2003 P A. fib CHADS2 score >5 on Eliquis Renal cancer status post right nephrectomy 10 05/16/2016 Dr. Nicolette Bang HTN HLD Hip fracture 01/2018  Admit 09/24/19 form [facility] ED with encephalopathy and increasing lethargy at home Found to be febrile 103 with agitation-in ED given 2 mg Haldol secondary to this WBC 10.9 creatinine 1.55    Assessment & Plan:   Principal Problem:   UTI (urinary tract infection) Active Problems:   Thrombocytopenia (HCC)   CKD (chronic kidney disease) stage 3, GFR 30-59 ml/min   Acute metabolic encephalopathy   Dementia with behavioral disturbance ([place])   Sepsis ([place])   1. Sepsis > Aspiration than UTI a. 2 vw CXR 6/20 my overread=Confluences in RUL and RLLL-concern for aspiration b. Appreciate SLP-esophagram shows tertiary contractions therefore no further work-up c. Strategies addressed with patient/family and will be placed on dysphagia diet going forward d.    ? 75 cc/H-->40 cc/h --- saline lock 6/21 e. Switch to Unasyn-d/c ceftriaxone-if no fevers over the course of the next 12 hours will transition to Augmentin to complete 7 days for aspiration pneumonia 2. Thrombocytopenia a. Likely secondary to sepsis on admission  b. This is improved  3. Toxic metabolic encephalopathy superimposed on advanced dementia/vascular Parkinson's/prior CVA and likely multi-infarct dementia a. more coherent but requires needs safety sitter b. Outpatient follow-up with Dr. Lavell Anchors of neurology for further  consideration of management 4. Paroxysmal atrial fibrillation on Eliquis CHADS2 score >5 status post ILR now nonfunctioning a. Continue Eliquis 2.5 twice daily b. Not currently on rate control 5. Renal cancer status post right nephrectomy as above a. Continue Flomax 0.4 mg every morning 6. CKD stage IIIb a. Renal function is stable 7. HTN a. No current medication 8. HLD no current medication 9. A1c 20.49 a. 84 years old-would not aggressively control glycemia given elevated mortality in this patient population b. Outpatient reconsideration per PCP  DVT prophylaxis: Eliquis Code Status: Full code Family Communication: Long discussion with the daughter wife bedside in person 6/21 Disposition: Likely discharge in 1 to 2 days if remains hemodynamically stable without fevers and transitions to oral medication  Status is: Inpatient  Remains inpatient appropriate because:Altered mental status   Dispo: The patient is from: Home              Anticipated d/c is to: To be determined at this time unclear if family can manage him              Anticipated d/c date is: 3 days              Patient currently is not medically stable to d/c.       Consultants:   None  Procedures: None  Antimicrobials: Rocephin 6/18   Subjective: In good spirits Had a full breakfast according to family Family members all at bedside He becomes somewhat labile at times but seems to be improved from prior Note low-grade temp overnight No chest pain fever chills   Objective: Vitals:   09/26/19 1852 09/26/19 2100 09/27/19 0622 09/27/19 1300  BP:  [name])  148/97 (!) 142/83 (!) 147/80  Pulse:  84 89 86  Resp:  20 18 18   Temp: 99.1 F (37.3 C) 98.1 F (36.7 C) 98.6 F (37 C) 98.5 F (36.9 C)  TempSrc: Oral Oral Oral Oral  SpO2:  94% 96% 99%  Weight:      Height:        Intake/Output Summary (Last 24 hours) at 09/27/2019 1634 Last data filed at 09/27/2019 1610 Gross per 24 hour  Intake 4227.65 ml   Output 1075 ml  Net 3152.65 ml   Filed Weights   09/24/19 2054  Weight: 90 kg    Examination:  General exam: EOMI NCAT  No JVD Neck soft supple Arcus senilis Respiratory system: No rhonchi rales Cardiovascular system: S1-S2 slightly bradycardic at times with PVCs on monitors which is chronic Gastrointestinal system: Soft nontender no rebound Central nervous system: Neurologically intact moving 4 limbs grossly equally without deficit Extremities: Soft nontender Skin: No rash Psychiatry: Euthymic congruent  Data Reviewed: I have personally reviewed following labs and imaging studies BUN/creatinine 24/1.55-->25/1.82-->28/1.8 Lactic acid 5.6-->1.1 WBC 10.9-->10.5-->4.6 Hemoglobin 12.7-->12.2 Platelet count 100-->85-->71   Radiology Studies: DG Chest 2 View  Result Date: 09/26/2019 CLINICAL DATA:  New onset cough EXAM: CHEST - 2 VIEW COMPARISON:  Chest radiograph dated 09/24/2019. FINDINGS: The heart size and mediastinal contours are within normal limits. There is mild bilateral infrahilar atelectasis/airspace disease. There is no pleural effusion or pneumothorax. Degenerative changes are seen in the spine and shoulders. A loop recorder overlies the left chest. IMPRESSION: Mild bilateral infrahilar atelectasis/airspace disease. Electronically Signed   By: Zerita Boers M.D.   On: 09/26/2019 13:25   DG ESOPHAGUS W SINGLE CM (SOL OR THIN BA)  Result Date: 09/27/2019 CLINICAL DATA:  Difficulty swallowing, possible stricture. EXAM: SINGLE COLUMN BARIUM ENEMA TECHNIQUE: Initial scout AP supine abdominal image obtained to insure adequate colon cleansing. Barium was introduced into the colon in a retrograde fashion and refluxed from the rectum to the cecum. Spot images of the colon followed by overhead radiographs were obtained. FLUOROSCOPY TIME:  Fluoroscopy Time:  2 minutes 12 seconds Radiation Exposure Index (if provided by the fluoroscopic device): 28.5 mGy Number of Acquired Spot  Images: 0 COMPARISON:  None. FINDINGS: A modified study was performed in partially recumbent and supine positions due to patient condition. Poor esophageal motility with extensive tertiary contractions and stasis of contrast in the esophagus. No obstruction. A 13 mm barium tablet passed into the stomach without difficulty. IMPRESSION: Limited study due to patient condition. Esophageal dysmotility. No stricture. Electronically Signed   By: Lorin Picket M.D.   On: 09/27/2019 14:52     Scheduled Meds: . apixaban  2.5 mg Oral BID  . cholecalciferol  5,000 Units Oral Daily  . erythromycin   Both Eyes Q8H  . ferrous sulfate  325 mg Oral Q breakfast  . tamsulosin  0.4 mg Oral QPC breakfast   Continuous Infusions: . sodium chloride 10 mL/hr at 09/27/19 0903  . ampicillin-sulbactam (UNASYN) IV 3 g (09/27/19 1155)     LOS: 3 days    Time spent: [place], MD [facility] To contact the attending provider between 7A-7P or the covering provider during after hours 7P-7A, please log into the web site [email or web address] and access using universal [place] password for that web site. If you do not have the password, please call the hospital operator.  09/27/2019, 4:34 PM

## 2019-09-27 NOTE — Progress Notes (Signed)
Assumed care of patient from previous RN.  Agree with previous RN's assessment of patient.  Will continue to monitor. 

## 2019-09-27 NOTE — Care Management Important Message (Signed)
Important Message  Patient Details IM Letter given to Gabriel Earing RN Case Manager to present to the Patient Name: Jeffrey Hardy MRN: [id number] Date of Birth: 1935/04/17   Medicare Important Message Given:  Yes     Kerin Salen 09/27/2019, 10:50 AM

## 2019-09-27 NOTE — Progress Notes (Signed)
[facility]--The Home-Based Palliative Care Division of [facility]--Pt is active with [facility] services for his Vascular Dementia.  He lives at home with wife [name].  Will follow hospital course and resume [facility] services when pt returns home.  Please contact [facility] if we can assist with d/c planning needs.  Note--[facility] involvement does not prohibit Home Health services and should not interfere with any HH d/c planning needs.  Thank Macie Burows, RN   Office [phone] or Mobile [phone].

## 2019-09-28 LAB — COMPREHENSIVE METABOLIC PANEL
ALT: 26 U/L (ref 0–44)
AST: 25 U/L (ref 15–41)
Albumin: 3.3 g/dL — ABNORMAL LOW (ref 3.5–5.0)
Alkaline Phosphatase: 42 U/L (ref 38–126)
Anion gap: 9 (ref 5–15)
BUN: 21 mg/dL (ref 8–23)
CO2: 22 mmol/L (ref 22–32)
Calcium: 8.4 mg/dL — ABNORMAL LOW (ref 8.9–10.3)
Chloride: 105 mmol/L (ref 98–111)
Creatinine, Ser: 1.54 mg/dL — ABNORMAL HIGH (ref 0.61–1.24)
GFR calc Af Amer: 48 mL/min — ABNORMAL LOW (ref >60)
GFR calc non Af Amer: 42 mL/min — ABNORMAL LOW (ref >60)
Glucose, Bld: 102 mg/dL — ABNORMAL HIGH (ref 70–99)
Potassium: 3.7 mmol/L (ref 3.5–5.1)
Sodium: 136 mmol/L (ref 135–145)
Total Bilirubin: 1 mg/dL (ref 0.3–1.2)
Total Protein: 5.6 g/dL — ABNORMAL LOW (ref 6.5–8.1)

## 2019-09-28 LAB — CBC WITH DIFFERENTIAL/PLATELET
Abs Immature Granulocytes: 0.03 10*3/uL (ref 0.00–0.07)
Basophils Absolute: 0 10*3/uL (ref 0.0–0.1)
Basophils Relative: 0 %
Eosinophils Absolute: 0.1 10*3/uL (ref 0.0–0.5)
Eosinophils Relative: 2 %
HCT: 37.6 % — ABNORMAL LOW (ref 39.0–52.0)
Hemoglobin: 13.1 g/dL (ref 13.0–17.0)
Immature Granulocytes: 0 %
Lymphocytes Relative: 6 %
Lymphs Abs: 0.4 10*3/uL — ABNORMAL LOW (ref 0.7–4.0)
MCH: 32.3 pg (ref 26.0–34.0)
MCHC: 34.8 g/dL (ref 30.0–36.0)
MCV: 92.8 fL (ref 80.0–100.0)
Monocytes Absolute: 0.6 10*3/uL (ref 0.1–1.0)
Monocytes Relative: 9 %
Neutro Abs: 5.6 10*3/uL (ref 1.7–7.7)
Neutrophils Relative %: 83 %
Platelets: 99 10*3/uL — ABNORMAL LOW (ref 150–400)
RBC: 4.05 MIL/uL — ABNORMAL LOW (ref 4.22–5.81)
RDW: 13 % (ref 11.5–15.5)
WBC: 6.9 10*3/uL (ref 4.0–10.5)
nRBC: 0 % (ref 0.0–0.2)

## 2019-09-28 MED ORDER — AMOXICILLIN-POT CLAVULANATE 875-125 MG PO TABS: 1.0000 | ORAL_TABLET | Freq: Two times a day (BID) | ORAL | 0 refills | Status: AC

## 2019-09-28 MED ORDER — ERYTHROMYCIN 5 MG/GM OP OINT: TOPICAL_OINTMENT | Freq: Three times a day (TID) | OPHTHALMIC | 0 refills | Status: DC

## 2019-09-28 MED ORDER — CAMPHOR-MENTHOL 0.5-0.5 % EX LOTN: TOPICAL_LOTION | CUTANEOUS | 0 refills | Status: AC | PRN

## 2019-09-28 NOTE — TOC Progression Note (Signed)
Transition of Care [facility]) - Progression Note    Patient Details  Name: Jeffrey Hardy MRN: [id number] Date of Birth: 04-Dec-1935  Transition of Care [facility]) CM/SW Contact  Purcell Mouton, RN Phone Number: 09/28/2019, 11:26 AM  Clinical Narrative:     [name] and daughter asked for transportation to home by PTAR. There is a $300.00 Co-pay if insurance improves transportation. Pt will need 2+ assist to go into the home. WC transportation will not provide this.   Expected Discharge Plan: [place] Barriers to Discharge: No Barriers Identified  Expected Discharge Plan and Services Expected Discharge Plan: [place] arrangements for the past 2 months: Single Family Home Expected Discharge Date: 09/28/19                                     Social Determinants of Health (SDOH) Interventions    Readmission Risk Interventions No flowsheet data found.

## 2019-09-28 NOTE — TOC Progression Note (Signed)
Transition of Care [facility]) - Progression Note    Patient Details  Name: Jeffrey Hardy MRN: [id number] Date of Birth: May 27, 1935  Transition of Care [facility]) CM/SW Contact  Purcell Mouton, RN Phone Number: 09/28/2019, 4:36 PM  Clinical Narrative:     [employer] transportation approval came [phone]. Family will continue with plan B related to the time. PTAR is not able to come ASAP.   Expected Discharge Plan: [place] Barriers to Discharge: No Barriers Identified  Expected Discharge Plan and Services Expected Discharge Plan: [place] arrangements for the past 2 months: Single Family Home Expected Discharge Date: 09/28/19                                     Social Determinants of Health (SDOH) Interventions    Readmission Risk Interventions No flowsheet data found.

## 2019-09-28 NOTE — TOC Progression Note (Signed)
Transition of Care [facility]) - Progression Note    Patient Details  Name: Jeffrey Hardy MRN: [id number] Date of Birth: 03-02-1936  Transition of Care [facility]) CM/SW Contact  Purcell Mouton, RN Phone Number: 09/28/2019, 1:29 PM  Clinical Narrative:     Continue to wait on authorization for PTAR from [employer].   Expected Discharge Plan: [place] Barriers to Discharge: No Barriers Identified  Expected Discharge Plan and Services Expected Discharge Plan: [place] arrangements for the past 2 months: Single Family Home Expected Discharge Date: 09/28/19                                     Social Determinants of Health (SDOH) Interventions    Readmission Risk Interventions No flowsheet data found.

## 2019-09-28 NOTE — Progress Notes (Signed)
Night RN reports pt slept well most of the night. This AM pt continues  with redness under armpits, redness around penal area, and the start of white coating on tongue. Will update MD during morning rounds. Daughter at bedside and [place] sitter with pt. Will cont to monitor. SRP, RN

## 2019-09-28 NOTE — Plan of Care (Signed)

## 2019-09-28 NOTE — Progress Notes (Signed)
Pt discharged to home instructions reviewed with pt, wife and daughter. All acknowledged understanding. SRP, RN

## 2019-09-28 NOTE — Progress Notes (Signed)
Patient slept well during the night, no agitation.

## 2019-09-28 NOTE — Discharge Summary (Signed)
Physician Discharge Summary  Jeffrey Hardy UYQ:[id number] DOB: 1935/06/18 DOA: 09/24/2019  PCP: Biagio Borg, MD  Admit date: 09/24/2019 Discharge date: 09/28/2019  Time spent: 40 minutes  Recommendations for Outpatient Follow-up:  1. Recommend Chem-12 CBC 1 week 2. Recommend chest x-ray 1 month 3. Complete course of Augmentin for probable aspiration-no change in diet consistency to dysphagia 3 4. Home health services resumed 5. May require discussion about goals if progression of memory deficits etc. however seems to be coming around and doing better at end of hospitalization 6. Please consider outpatient follow-up with Dr. Lavell Anchors of neurology for vascular Parkinson's 7. Screening follow-up with Dr. Nicolette [facility] of [facility] secondary to nephrectomy in 2018 8. Close consideration by Dr. [name] cardiology as an outpatient regarding Eliquis and risk-benefit ratio---he had mild thrombocytopenia this admission  Discharge Diagnoses:  Principal Problem:   UTI (urinary tract infection) Active Problems:   Thrombocytopenia (HCC)   CKD (chronic kidney disease) stage 3, GFR 30-59 ml/min   Acute metabolic encephalopathy   Dementia with behavioral disturbance (HCC)   Sepsis ([place])   Discharge Condition: Fair  Diet recommendation: Dysphagia 3  Filed Weights   09/24/19 2054  Weight: 90 kg    History of present illness:  84 year old white male community dwelling Multi-infarct dementia-?  Vascular parkinsonism-??  Followed by Dr. Lavell Anchors of [facility] neurology             Also status post CVA posterior right frontal lobe 2013             Right foot drop since 01/2019 Followed by Dr. Adam Phenix status post [now nonfunctioning] ILR 06/2012 f PVCs bigeminy Benign liver cysts on ultrasound 2003 P A. fib CHADS2 score >5 on Eliquis Renal cancer status post right nephrectomy 10 05/16/2016 Dr. Nicolette Bang HTN HLD Hip fracture 01/2018  Admit 09/24/19 form [facility] ED with encephalopathy  and increasing lethargy at home Found to be febrile 103 with agitation-in ED given 2 mg Haldol secondary to this WBC 10.9 creatinine 1.55  Hospital Course:  1. Sepsis > Aspiration than UTI a. 2 vw CXR 6/20 my overread=Confluences in RUL and RLLL-concern for aspiration. b. Appreciate SLP-esophagram shows tertiary contractions therefore no further work-up  At this time c. Strategies addressed with patient/family-dysphagia 3 diet on discharge d.    ? 75 cc/H-->40 cc/h --- saline lock 6/21 e. Antibiotics were tailored during hospital stay to eventually Augmentin to complete 10 days for aspiration pneumonia 2. Thrombocytopenia a. Likely secondary to sepsis on admission  b. This is improved  c. Watch carefully in the outpatient setting as is on Eliquis may need repeat blood work to ensure platelets have improved (they have already) 3. Toxic metabolic encephalopathy superimposed on advanced dementia/vascular Parkinson's/prior CVA and likely multi-infarct dementia a. more coherent but requires needs safety sitter b. Outpatient follow-up with Dr. Lavell Anchors of neurology for further consideration of management 4. Paroxysmal atrial fibrillation on Eliquis CHADS2 score >5 status post ILR now nonfunctioning a. Continue Eliquis 2.5 twice daily b. Not currently on rate control secondary to occasional bradycardia c. In this hospital stay predominantly PVCs and A. fib rate controlled d. Will need discussion in the outpatient setting if platelets are still low after labs with regards to Eliquis 5. Renal cancer status post right nephrectomy as above a. Continue Flomax 0.4 mg every morning 6. CKD stage IIIb a. Renal function is stable 7. HTN a. No current medication 8. HLD no current medication 9. A1c 5.3 a. 84 years  old-would not aggressively control glycemia given elevated mortality in this patient population b. Outpatient reconsideration per PCP    Procedures:  Esophageog as  above   Consultations:  None  Discharge Exam: Vitals:   09/27/19 2155 09/28/19 0525  BP: (!) 162/98 (!) 150/88  Pulse: 84 91  Resp: 18 20  Temp: 98.3 F (36.8 C) (!) 97.3 F (36.3 C)  SpO2: 99% 94%    General: Awake emotionally labile at times some mild confusion but seems around baseline No thrush  no distress EOMI NCAT no focal deficit Cardiovascular: S1-S2 no murmur rub or gallop Respiratory: Clinically clear no added sound Abdomen soft nontender no rebound no guarding No lower extremity edema  Discharge Instructions   Discharge Instructions    Diet - low sodium heart healthy   Complete by: As directed    Discharge instructions   Complete by: As directed    Please complete course of Augmentin going forward Please follow-up with Dr. Cathlean Cower in 1 week get screening labs Would recommend a chest x-ray in 3 weeks Would recommend chopped diet and supervision with care with diet May require discussion about dermatology referral given area under his right armpit with redness-did not think it was fungal   Increase activity slowly   Complete by: As directed      Allergies as of 09/28/2019      Reactions   Acyclovir And Related Other (See Comments)   Unsure of what happens      Medication List    STOP taking these medications   sulfamethoxazole-trimethoprim 800-160 MG tablet Commonly known as: BACTRIM DS     TAKE these medications   amoxicillin-clavulanate 875-125 MG tablet Commonly known as: AUGMENTIN Take 1 tablet by mouth every 12 (twelve) hours for 6 days.   camphor-menthol lotion Commonly known as: SARNA Apply topically as needed for itching.   Eliquis 2.5 MG Tabs tablet Generic drug: apixaban Take 1 tablet (2.5 mg total) by mouth 2 (two) times daily.   erythromycin ophthalmic ointment Place into both eyes every 8 (eight) hours.   ferrous sulfate 325 (65 FE) MG tablet Take 325 mg by mouth daily with breakfast.   polyethylene glycol powder 17  GM/SCOOP powder Commonly known as: GLYCOLAX/MIRALAX Take 17 g by mouth 2 (two) times daily as needed. What changed: reasons to take this   senna-docusate 8.6-50 MG tablet Commonly known as: Senokot-S Take 1 tablet by mouth daily as needed for mild constipation.   tamsulosin 0.4 MG Caps capsule Commonly known as: FLOMAX Take 1 capsule (0.4 mg total) by mouth daily after breakfast.   VITAMIN B COMPLEX PO Take 1 tablet by mouth daily. "high powered"   Vitamin D3 125 MCG (5000 UT) Caps Take 5,000 Units by mouth daily.      Allergies  Allergen Reactions  . Acyclovir And Related Other (See Comments)    Unsure of what happens      The results of significant diagnostics from this hospitalization (including imaging, microbiology, ancillary and laboratory) are listed below for reference.    Significant Diagnostic Studies: DG Chest 2 View  Result Date: 09/26/2019 CLINICAL DATA:  New onset cough EXAM: CHEST - 2 VIEW COMPARISON:  Chest radiograph dated 09/24/2019. FINDINGS: The heart size and mediastinal contours are within normal limits. There is mild bilateral infrahilar atelectasis/airspace disease. There is no pleural effusion or pneumothorax. Degenerative changes are seen in the spine and shoulders. A loop recorder overlies the left chest. IMPRESSION: Mild bilateral infrahilar atelectasis/airspace disease.  Electronically Signed   By: Zerita Boers M.D.   On: 09/26/2019 13:25   DG Chest 2 View  Result Date: 09/22/2019 CLINICAL DATA:  Right renal cell carcinoma. No chest complaints. EXAM: CHEST - 2 VIEW COMPARISON:  Most recent chest radiograph 09/10/2018 FINDINGS: Implanted loop recorder projects over the left chest wall.The cardiomediastinal contours are normal. Mild interstitial coarsening that is chronic. Chronic biapical pleuroparenchymal scarring. Pulmonary vasculature is normal. No consolidation, pleural effusion, or pneumothorax. No acute osseous abnormalities are seen. Bones are  under mineralized. Flowing syndesmophytes throughout the thoracic spine. No evidence of focal bone lesion. IMPRESSION: No acute chest findings. No radiographic findings suspicious for metastatic disease. Electronically Signed   By: Keith Rake M.D.   On: 09/22/2019 17:01   DG Chest Port 1 View  Result Date: 09/24/2019 CLINICAL DATA:  Confusion and weakness EXAM: PORTABLE CHEST 1 VIEW COMPARISON:  01/29/2018, 09/22/2019 FINDINGS: Recording device over left chest wall. No focal airspace disease or effusion. Stable cardiomediastinal silhouette with aortic atherosclerosis. No pneumothorax. IMPRESSION: No active disease. Electronically Signed   By: Donavan Foil M.D.   On: 09/24/2019 16:05   DG ESOPHAGUS W SINGLE CM (SOL OR THIN BA)  Result Date: 09/27/2019 CLINICAL DATA:  Difficulty swallowing, possible stricture. EXAM: SINGLE COLUMN BARIUM ENEMA TECHNIQUE: Initial scout AP supine abdominal image obtained to insure adequate colon cleansing. Barium was introduced into the colon in a retrograde fashion and refluxed from the rectum to the cecum. Spot images of the colon followed by overhead radiographs were obtained. FLUOROSCOPY TIME:  Fluoroscopy Time:  2 minutes 12 seconds Radiation Exposure Index (if provided by the fluoroscopic device): 28.5 mGy Number of Acquired Spot Images: 0 COMPARISON:  None. FINDINGS: A modified study was performed in partially recumbent and supine positions due to patient condition. Poor esophageal motility with extensive tertiary contractions and stasis of contrast in the esophagus. No obstruction. A 13 mm barium tablet passed into the stomach without difficulty. IMPRESSION: Limited study due to patient condition. Esophageal dysmotility. No stricture. Electronically Signed   By: Lorin Picket M.D.   On: 09/27/2019 14:52    Microbiology: Recent Results (from the past 240 hour(s))  Urine culture     Status: Abnormal   Collection Time: 09/24/19  3:47 PM   Specimen: Urine,  Random  Result Value Ref Range Status   Specimen Description   Final    URINE, RANDOM Performed at [facility] [address], [place], [place] [address]    Special Requests   Final    NONE Performed at [facility], [place] [address] [place], [place] [address]    Culture >=100,000 COLONIES/mL KLEBSIELLA PNEUMONIAE (A)  Final   Report Status 09/27/2019 FINAL  Final   Organism ID, Bacteria KLEBSIELLA PNEUMONIAE (A)  Final      Susceptibility   Klebsiella pneumoniae - MIC*    AMPICILLIN RESISTANT Resistant     CEFAZOLIN <=4 SENSITIVE Sensitive     CEFTRIAXONE <=0.25 SENSITIVE Sensitive     CIPROFLOXACIN 1 SENSITIVE Sensitive     GENTAMICIN <=1 SENSITIVE Sensitive     IMIPENEM <=0.25 SENSITIVE Sensitive     NITROFURANTOIN 64 INTERMEDIATE Intermediate     TRIMETH/SULFA >=320 RESISTANT Resistant     AMPICILLIN/SULBACTAM 4 SENSITIVE Sensitive     PIP/TAZO <=4 SENSITIVE Sensitive     * >=100,000 COLONIES/mL KLEBSIELLA PNEUMONIAE  SARS Coronavirus 2 by RT PCR (hospital order, performed in [facility] hospital lab) Nasopharyngeal Nasopharyngeal Swab  Status: None   Collection Time: 09/24/19  5:50 PM   Specimen: Nasopharyngeal Swab  Result Value Ref Range Status   SARS Coronavirus 2 NEGATIVE NEGATIVE Final    Comment: (NOTE) SARS-CoV-2 target nucleic acids are NOT DETECTED.  The SARS-CoV-2 RNA is generally detectable in upper and lower respiratory specimens during the acute phase of infection. The lowest concentration of SARS-CoV-2 viral copies this assay can detect is 250 copies / mL. A negative result does not preclude SARS-CoV-2 infection and should not be used as the sole basis for treatment or other patient management decisions.  A negative result may occur with improper specimen collection / handling, submission of specimen other than nasopharyngeal swab, presence of viral mutation(s) within the areas targeted by this assay, and  inadequate number of viral copies (<250 copies / mL). A negative result must be combined with clinical observations, patient history, and epidemiological information.  Fact Sheet for Patients:   StrictlyIdeas.no  Fact Sheet for Healthcare Providers: BankingDealers.co.za  This test is not yet approved or  cleared by the [place] FDA and has been authorized for detection and/or diagnosis of SARS-CoV-2 by FDA under an Emergency Use Authorization (EUA).  This EUA will remain in effect (meaning this test can be used) for the duration of the COVID-19 declaration under Section 564(b)(1) of the Act, 21 U.S.C. section 360bbb-3(b)(1), unless the authorization is terminated or revoked sooner.  Performed at [facility], [place] [address], [place], [place] [address]   Culture, blood (routine x 2)     Status: None (Preliminary result)   Collection Time: 09/24/19  5:58 PM   Specimen: BLOOD  Result Value Ref Range Status   Specimen Description   Final    BLOOD BLOOD LEFT FOREARM Performed at [facility] [address], [place], [place] [address]    Special Requests   Final    BOTTLES DRAWN AEROBIC AND ANAEROBIC Blood Culture adequate volume Performed at [facility] [address] [place],  [address]    Culture   Final    NO GROWTH 4 DAYS Performed at [facility], [place] [address], [place],  [address]    Report Status PENDING  Incomplete     Labs: Basic Metabolic Panel: Recent Labs  Lab 09/24/19 1547 09/25/19 0109 09/26/19 0425 09/27/19 0438 09/28/19 0435  NA 139 136 137 134* 136  K 3.5 4.1 3.8 3.6 3.7  CL 108 107 108 103 105  CO2 23 22 19* 20* 22  GLUCOSE 99 97 94 106* 102*  BUN 24* 25* 28* 27* 21  CREATININE 1.55* 1.82* 1.89* 1.84* 1.54*  CALCIUM 7.6* 8.1* 8.1* 8.3* 8.4*   Liver Function Tests: Recent Labs  Lab 09/24/19 1547 09/26/19 0425  09/27/19 0438 09/28/19 0435  AST 16 23 29 25   ALT 18 19 25 26   ALKPHOS 43 38 48 42  BILITOT 1.1 0.7 0.7 1.0  PROT 5.6* 5.3* 5.9* 5.6*  ALBUMIN 3.5 3.1* 3.5 3.3*   No results for input(s): LIPASE, AMYLASE in the last 168 hours. No results for input(s): AMMONIA in the last 168 hours. CBC: Recent Labs  Lab 09/24/19 1547 09/25/19 0109 09/26/19 0425 09/27/19 0438 09/28/19 0435  WBC 10.9* 10.5 4.6 7.4 6.9  NEUTROABS 9.6*  --  3.7 6.2 5.6  HGB 13.1 12.7* 12.2* 13.8 13.1  HCT 39.5 38.4* 36.5* 40.6 37.6*  MCV 98.5 98.2 97.6 96.4 92.8  PLT 100* 85* 71* 94* 99*   Cardiac  Enzymes: No results for input(s): CKTOTAL, CKMB, CKMBINDEX, TROPONINI in the last 168 hours. BNP: BNP (last 3 results) No results for input(s): BNP in the last 8760 hours.  ProBNP (last 3 results) No results for input(s): PROBNP in the last 8760 hours.  CBG: No results for input(s): GLUCAP in the last 168 hours.     Signed:  Nita Sells MD   [facility] 09/28/2019, 10:30 AM

## 2019-09-28 NOTE — TOC Progression Note (Signed)
Transition of Care [facility]) - Progression Note    Patient Details  Name: Jeffrey Hardy MRN: [id number] Date of Birth: 08/16/1935  [facility] CM/SW Contact  Purcell Mouton, RN Phone Number: 09/28/2019, 3:22 PM  Clinical Narrative:     A call to [employer] was made to check on transportation approval. Rep state [employer] have 72 hrs to approve or decline. Explained to daughter, who states that she and her mother cannot get pt into the house. Daughter is thinking she May be able to call the fire department to help get him inside. Will wait one more hour for [employer].   Expected Discharge Plan: [place] Barriers to Discharge: No Barriers Identified  Expected Discharge Plan and Services Expected Discharge Plan: [place] arrangements for the past 2 months: Single Family Home Expected Discharge Date: 09/28/19                                     Social Determinants of Health (SDOH) Interventions    Readmission Risk Interventions No flowsheet data found.

## 2019-09-29 LAB — CULTURE, BLOOD (ROUTINE X 2)
Culture: NO GROWTH
Special Requests: ADEQUATE

## 2019-09-30 ENCOUNTER — Encounter: Payer: Self-pay | Admitting: Internal Medicine

## 2019-09-30 ENCOUNTER — Telehealth: Payer: Self-pay | Admitting: *Deleted

## 2019-09-30 ENCOUNTER — Telehealth: Payer: Self-pay

## 2019-09-30 NOTE — Telephone Encounter (Signed)
Transition Care Management Follow-up Telephone Call   Date discharged? 09/28/19   How have you been since you were released from the hospital? Spoke w/ptwife she states he is doing ok   Do you understand why you were in the hospital? YES   Do you understand the discharge instructions? YES   Where were you discharged to? Home   Items Reviewed:  Medications reviewed: YES, currenting taking antibiotic Amoxillian  Allergies reviewed: YES, no changes  Dietary changes reviewed: YES wife states only eating soft food  Referrals reviewed: No referral recommeded   Functional Questionnaire:   Activities of Daily Living (ADLs):   She states he are independent in the following: ambulation, bathing and hygiene, feeding, continence, grooming, toileting and dressing States he doesn't require assistance   Any transportation issues/concerns?: NO   Any patient concerns? NO   Confirmed importance and date/time of follow-up visits scheduled YES, appt 10/06/19  Provider Appointment booked with Dr. Jenny Reichmann  Confirmed with patient if condition begins to worsen call PCP or go to the ER.  Patient was given the office number and encouraged to call back with question or concerns.  : YES

## 2019-10-01 DIAGNOSIS — N390 Urinary tract infection, site not specified: Secondary | ICD-10-CM | POA: Diagnosis not present

## 2019-10-02 DIAGNOSIS — R0602 Shortness of breath: Secondary | ICD-10-CM | POA: Diagnosis not present

## 2019-10-06 ENCOUNTER — Inpatient Hospital Stay: Payer: PPO | Admitting: Internal Medicine

## 2019-10-06 ENCOUNTER — Telehealth: Payer: Self-pay | Admitting: Internal Medicine

## 2019-10-06 MED ORDER — KETOCONAZOLE 2 % EX CREA: 1.0000 "application " | TOPICAL_CREAM | Freq: Every day | CUTANEOUS | 2 refills | Status: DC

## 2019-10-06 NOTE — Telephone Encounter (Signed)
Medication: Nystatin powder Care connection-Julie wants to know if Dr. Jenny Reichmann would be able to provide a cream instead of powder due to fungus in the groin area.  Patient had Hospital f/u 6.30.21, but cancelled the appointment.

## 2019-10-06 NOTE — Telephone Encounter (Signed)
Done erx 

## 2019-10-07 DIAGNOSIS — R32 Unspecified urinary incontinence: Secondary | ICD-10-CM | POA: Diagnosis not present

## 2019-10-19 ENCOUNTER — Encounter: Payer: Self-pay | Admitting: Internal Medicine

## 2019-10-19 NOTE — Telephone Encounter (Signed)
Jeffrey Hardy or lucy  I need a out of hosp DNR form to sign , thanks

## 2019-10-28 DIAGNOSIS — L300 Nummular dermatitis: Secondary | ICD-10-CM | POA: Diagnosis not present

## 2019-10-28 DIAGNOSIS — L570 Actinic keratosis: Secondary | ICD-10-CM | POA: Diagnosis not present

## 2019-11-07 DIAGNOSIS — R32 Unspecified urinary incontinence: Secondary | ICD-10-CM | POA: Diagnosis not present

## 2019-12-02 DIAGNOSIS — C641 Malignant neoplasm of right kidney, except renal pelvis: Secondary | ICD-10-CM | POA: Diagnosis not present

## 2019-12-02 DIAGNOSIS — R8271 Bacteriuria: Secondary | ICD-10-CM | POA: Diagnosis not present

## 2019-12-02 DIAGNOSIS — R8279 Other abnormal findings on microbiological examination of urine: Secondary | ICD-10-CM | POA: Diagnosis not present

## 2019-12-03 ENCOUNTER — Telehealth: Payer: Self-pay | Admitting: Internal Medicine

## 2019-12-03 NOTE — Telephone Encounter (Signed)
Almyra Free called requesting referral to [facility] for PT

## 2019-12-03 NOTE — Telephone Encounter (Signed)
Maybe this can be done if [facility] is already seeing him currently, o/w he would need ROV as the rules say he needs ROV within 90 days of starting home health for me to order

## 2019-12-03 NOTE — Telephone Encounter (Signed)
Sent to Dr. John. 

## 2019-12-07 DIAGNOSIS — L602 Onychogryphosis: Secondary | ICD-10-CM | POA: Diagnosis not present

## 2019-12-07 DIAGNOSIS — M79672 Pain in left foot: Secondary | ICD-10-CM | POA: Diagnosis not present

## 2019-12-07 DIAGNOSIS — L84 Corns and callosities: Secondary | ICD-10-CM | POA: Diagnosis not present

## 2019-12-07 DIAGNOSIS — M79671 Pain in right foot: Secondary | ICD-10-CM | POA: Diagnosis not present

## 2019-12-08 DIAGNOSIS — R32 Unspecified urinary incontinence: Secondary | ICD-10-CM | POA: Diagnosis not present

## 2019-12-23 ENCOUNTER — Encounter: Payer: Self-pay | Admitting: Internal Medicine

## 2019-12-24 ENCOUNTER — Encounter: Payer: Self-pay | Admitting: Internal Medicine

## 2020-01-03 DIAGNOSIS — R3914 Feeling of incomplete bladder emptying: Secondary | ICD-10-CM | POA: Diagnosis not present

## 2020-01-03 DIAGNOSIS — N401 Enlarged prostate with lower urinary tract symptoms: Secondary | ICD-10-CM | POA: Diagnosis not present

## 2020-01-03 DIAGNOSIS — C641 Malignant neoplasm of right kidney, except renal pelvis: Secondary | ICD-10-CM | POA: Diagnosis not present

## 2020-01-07 DIAGNOSIS — R32 Unspecified urinary incontinence: Secondary | ICD-10-CM | POA: Diagnosis not present

## 2020-01-11 DIAGNOSIS — F0390 Unspecified dementia without behavioral disturbance: Secondary | ICD-10-CM | POA: Diagnosis not present

## 2020-01-11 DIAGNOSIS — N390 Urinary tract infection, site not specified: Secondary | ICD-10-CM | POA: Diagnosis not present

## 2020-02-07 DIAGNOSIS — R32 Unspecified urinary incontinence: Secondary | ICD-10-CM | POA: Diagnosis not present

## 2020-02-09 ENCOUNTER — Telehealth: Payer: Self-pay | Admitting: Internal Medicine

## 2020-02-09 DIAGNOSIS — N390 Urinary tract infection, site not specified: Secondary | ICD-10-CM

## 2020-02-09 NOTE — Telephone Encounter (Signed)
Ok I have ordered the urine and culture if they want to bring sample with the standing order I did.  They should consult with lab as to how the sample is to be obtained and transported or it may not be accepted

## 2020-02-09 NOTE — Telephone Encounter (Signed)
Sent to Dr. John to advise. 

## 2020-02-09 NOTE — Telephone Encounter (Signed)
Scheduled a follow-up appt for Jeffrey Hardy on 12.8.21, Jeffrey Hardy his wife wants to know if he will need to bring a urine sample that day  Jeffrey Hardy follows up with his Urologist on 12.27.21  Please contact the patient's wife and advise.

## 2020-02-11 NOTE — Telephone Encounter (Signed)
Spoke with pts wife and was able to inform her of Dr. Gwynn Burly instructions.  **Pt wife states they will the urine sample in when pt comes in for his visit.

## 2020-03-08 DIAGNOSIS — R339 Retention of urine, unspecified: Secondary | ICD-10-CM | POA: Diagnosis not present

## 2020-03-15 ENCOUNTER — Encounter: Payer: Self-pay | Admitting: Internal Medicine

## 2020-03-15 ENCOUNTER — Other Ambulatory Visit: Payer: Self-pay

## 2020-03-15 ENCOUNTER — Other Ambulatory Visit: Payer: Self-pay | Admitting: Internal Medicine

## 2020-03-15 ENCOUNTER — Ambulatory Visit (INDEPENDENT_AMBULATORY_CARE_PROVIDER_SITE_OTHER): Payer: PPO | Admitting: Internal Medicine

## 2020-03-15 VITALS — BP 130/90 | HR 74 | Temp 97.9°F | Ht 73.0 in | Wt 200.0 lb

## 2020-03-15 DIAGNOSIS — F0391 Unspecified dementia with behavioral disturbance: Secondary | ICD-10-CM

## 2020-03-15 DIAGNOSIS — E538 Deficiency of other specified B group vitamins: Secondary | ICD-10-CM

## 2020-03-15 DIAGNOSIS — E785 Hyperlipidemia, unspecified: Secondary | ICD-10-CM

## 2020-03-15 DIAGNOSIS — I10 Essential (primary) hypertension: Secondary | ICD-10-CM | POA: Diagnosis not present

## 2020-03-15 DIAGNOSIS — R739 Hyperglycemia, unspecified: Secondary | ICD-10-CM

## 2020-03-15 DIAGNOSIS — Z0000 Encounter for general adult medical examination without abnormal findings: Secondary | ICD-10-CM | POA: Diagnosis not present

## 2020-03-15 DIAGNOSIS — N183 Chronic kidney disease, stage 3 unspecified: Secondary | ICD-10-CM

## 2020-03-15 DIAGNOSIS — D509 Iron deficiency anemia, unspecified: Secondary | ICD-10-CM | POA: Diagnosis not present

## 2020-03-15 DIAGNOSIS — N390 Urinary tract infection, site not specified: Secondary | ICD-10-CM | POA: Diagnosis not present

## 2020-03-15 LAB — CBC WITH DIFFERENTIAL/PLATELET
Basophils Absolute: 0 10*3/uL (ref 0.0–0.1)
Basophils Relative: 0.6 % (ref 0.0–3.0)
Eosinophils Absolute: 0.1 10*3/uL (ref 0.0–0.7)
Eosinophils Relative: 1.9 % (ref 0.0–5.0)
HCT: 45.7 % (ref 39.0–52.0)
Hemoglobin: 15.5 g/dL (ref 13.0–17.0)
Lymphocytes Relative: 12.1 % (ref 12.0–46.0)
Lymphs Abs: 0.6 10*3/uL — ABNORMAL LOW (ref 0.7–4.0)
MCHC: 33.9 g/dL (ref 30.0–36.0)
MCV: 98 fl (ref 78.0–100.0)
Monocytes Absolute: 0.4 10*3/uL (ref 0.1–1.0)
Monocytes Relative: 8.6 % (ref 3.0–12.0)
Neutro Abs: 3.6 10*3/uL (ref 1.4–7.7)
Neutrophils Relative %: 76.8 % (ref 43.0–77.0)
Platelets: 155 10*3/uL (ref 150.0–400.0)
RBC: 4.66 Mil/uL (ref 4.22–5.81)
RDW: 13 % (ref 11.5–15.5)
WBC: 4.7 10*3/uL (ref 4.0–10.5)

## 2020-03-15 LAB — LIPID PANEL
Cholesterol: 157 mg/dL (ref 0–200)
HDL: 37.2 mg/dL — ABNORMAL LOW (ref >39.00)
LDL Cholesterol: 95 mg/dL (ref 0–99)
NonHDL: 119.46
Total CHOL/HDL Ratio: 4
Triglycerides: 120 mg/dL (ref 0.0–149.0)
VLDL: 24 mg/dL (ref 0.0–40.0)

## 2020-03-15 LAB — URINALYSIS, ROUTINE W REFLEX MICROSCOPIC
Bilirubin Urine: NEGATIVE
Hgb urine dipstick: NEGATIVE
Ketones, ur: NEGATIVE
Leukocytes,Ua: NEGATIVE
Nitrite: NEGATIVE
RBC / HPF: NONE SEEN (ref 0–?)
Specific Gravity, Urine: 1.025 (ref 1.000–1.030)
Total Protein, Urine: NEGATIVE
Urine Glucose: NEGATIVE
Urobilinogen, UA: 0.2 (ref 0.0–1.0)
pH: 5.5 (ref 5.0–8.0)

## 2020-03-15 LAB — IBC PANEL
Iron: 126 ug/dL (ref 42–165)
Saturation Ratios: 46.9 % (ref 20.0–50.0)
Transferrin: 192 mg/dL — ABNORMAL LOW (ref 212.0–360.0)

## 2020-03-15 LAB — BASIC METABOLIC PANEL
BUN: 29 mg/dL — ABNORMAL HIGH (ref 6–23)
CO2: 29 mEq/L (ref 19–32)
Calcium: 9.6 mg/dL (ref 8.4–10.5)
Chloride: 102 mEq/L (ref 96–112)
Creatinine, Ser: 2.07 mg/dL — ABNORMAL HIGH (ref 0.40–1.50)
GFR: 29.37 mL/min — ABNORMAL LOW (ref >60.00)
Glucose, Bld: 96 mg/dL (ref 70–99)
Potassium: 4.1 mEq/L (ref 3.5–5.1)
Sodium: 138 mEq/L (ref 135–145)

## 2020-03-15 LAB — HEPATIC FUNCTION PANEL
ALT: 24 U/L (ref 0–53)
AST: 19 U/L (ref 0–37)
Albumin: 4.4 g/dL (ref 3.5–5.2)
Alkaline Phosphatase: 60 U/L (ref 39–117)
Bilirubin, Direct: 0.1 mg/dL (ref 0.0–0.3)
Total Bilirubin: 0.6 mg/dL (ref 0.2–1.2)
Total Protein: 6.6 g/dL (ref 6.0–8.3)

## 2020-03-15 LAB — PHOSPHORUS: Phosphorus: 3.8 mg/dL (ref 2.3–4.6)

## 2020-03-15 LAB — VITAMIN D 25 HYDROXY (VIT D DEFICIENCY, FRACTURES): VITD: 79.76 ng/mL (ref 30.00–100.00)

## 2020-03-15 LAB — VITAMIN B12: Vitamin B-12: 420 pg/mL (ref 211–911)

## 2020-03-15 LAB — HEMOGLOBIN A1C: Hgb A1c MFr Bld: 5.4 % (ref 4.6–6.5)

## 2020-03-15 LAB — FERRITIN: Ferritin: 129.6 ng/mL (ref 22.0–322.0)

## 2020-03-15 MED ORDER — TAMSULOSIN HCL 0.4 MG PO CAPS: 0.4000 mg | ORAL_CAPSULE | Freq: Two times a day (BID) | ORAL | 3 refills | Status: DC

## 2020-03-15 NOTE — Progress Notes (Signed)
   Subjective:    Patient ID: Jeffrey Hardy, male    DOB: 1935/11/23, 84 y.o.   MRN: [id number]  HPI  Had mild UTI recurrent oct 2021   EMS called 4 days ago with slip and slide out of bed, landed on both knees with red spots, then must have fallen back with small bruise to left buttock.   Has appt with urology dec 17, and derm dc 21.    Review of Systems     Objective:   Physical Exam        Assessment & Plan:

## 2020-03-15 NOTE — Patient Instructions (Signed)
Please consider seeing Sports Medicine on the first floor for the right (and left) shoulders  Please continue all other medications as before, and refills have been done if requested.  Please have the pharmacy call with any other refills you may need.  Please continue your efforts at being more active, low cholesterol diet, and weight control.  You are otherwise up to date with prevention measures today.  Please keep your appointments with your specialists as you may have planned  Please go to the LAB at the blood drawing area for the tests to be done  You will be contacted by phone if any changes need to be made immediately.  Otherwise, you will receive a letter about your results with an explanation, but please check with MyChart first.  Please remember to sign up for MyChart if you have not done so, as this will be important to you in the future with finding out test results, communicating by private email, and scheduling acute appointments online when needed.  Please make an Appointment to return in 6 months, or sooner if needed

## 2020-03-16 LAB — URINE CULTURE
Result:: NO GROWTH
Result:: NO GROWTH

## 2020-03-17 ENCOUNTER — Encounter: Payer: Self-pay | Admitting: Internal Medicine

## 2020-03-18 ENCOUNTER — Encounter: Payer: Self-pay | Admitting: Internal Medicine

## 2020-03-18 NOTE — Assessment & Plan Note (Signed)
Lab Results  Component Value Date   [place] 03/15/2020  stable overall by history and exam, recent data reviewed with pt, and pt to continue medical treatment as before,  to f/u any worsening symptoms or concerns

## 2020-03-18 NOTE — Assessment & Plan Note (Signed)
stable overall by history and exam, recent data reviewed with pt, and pt to continue medical treatment as before,  to f/u any worsening symptoms or concerns  

## 2020-03-18 NOTE — Assessment & Plan Note (Signed)

## 2020-03-18 NOTE — Assessment & Plan Note (Signed)
For f/u ua

## 2020-03-18 NOTE — Assessment & Plan Note (Signed)
For f/u lab 

## 2020-03-18 NOTE — Progress Notes (Addendum)
Subjective:    Patient ID: Jeffrey Hardy, male    DOB: 09-13-1935, 84 y.o.   MRN: [id number]  HPI  Here for wellness and f/u with family;  Overall doing ok;  Pt denies Chest pain, worsening SOB, DOE, wheezing, orthopnea, PND, worsening LE edema, palpitations, dizziness or syncope.  Pt denies neurological change such as new headache, facial or extremity weakness.  Pt denies polydipsia, polyuria, or low sugar symptoms. Pt states overall good compliance with treatment and medications, good tolerability, and has been trying to follow appropriate diet.  Pt denies worsening depressive symptoms, suicidal ideation or panic. No fever, night sweats, wt loss, loss of appetite, or other constitutional symptoms.  Family states pt has recent worsening ability with ADL's, has at least mod to high fall risk now, home safety reviewed and adequate, no other significant changes in hearing or vision, and not active with exercise.  Had mild UTI recurrent oct 2021 , family asks for ua with labs.   EMS called 4 days ago with slip and slide out of bed, landed on both knees with red spots, then must have fallen back with small bruise to left buttock.  Has appt with urology dec 17, and derm dc 21.   Also has right > left shoulder pain with reduced ROM, worse on the left. Past Medical History:  Diagnosis Date  . Anxiety   . Bilateral renal cysts   . Cancer ([place])    renal mass  . Dehydration 08/2018  . Depression   . Diverticulosis of colon   . Erectile dysfunction   . Hematuria   . History of cerebral embolic infarction x2 CVA's ---  neurologist-  [name]--- per pt residual abnormal gait , does need cane   12-16-2011  right frontal embolic infarct and [id number] left frontal embolic infarct (per first MRI previous hemorrgaic cerebral ischemia)  . History of duodenal ulcer    1997  . History of Helicobacter pylori infection    1997  . History of hiatal hernia   . History of loop recorder    Current  . History of  syncope    1997  and 2014  s/p  loop recorder  . Hyperlipidemia   . Hypertension   . Nocturia   . PAF (paroxysmal atrial fibrillation) [facility])    cardiologist-  dr [name]  . Peripheral neuropathy 05/04/2018  . Renal cell cancer ([place]) 09/06/2016  . Right renal mass   . Status post placement of implantable loop recorder    07-06-2012  . Stroke [facility])    2013 2 mild strokes mild weakness bil sides  . Urgency of urination   . Wears hearing aid    right ear only   Past Surgical History:  Procedure Laterality Date  . APPENDECTOMY  age 38  . COLONOSCOPY  last one 07-17-2015  . CYSTOSCOPY/RETROGRADE/URETEROSCOPY Right 04/22/2016   Procedure: CYSTOSCOPY/RETROGRADE/URETEROSCOPY/ BIOPSY/ STENT PLACEMENT;  Surgeon: [name], MD;  Location: [facility];  Service: Urology;  Laterality: Right;  . ESOPHAGOGASTRODUODENOSCOPY  last one 09-16-2014  . LOOP RECORDER IMPLANT N/A 07/06/2012   Procedure: LOOP RECORDER IMPLANT;  Surgeon: [name], MD;  Location: [facility] CATH LAB;  Service: Cardiovascular;  Laterality: N/A;  . ROBOT ASSITED LAPAROSCOPIC NEPHROURETERECTOMY Right 05/13/2016   Procedure: XI ROBOT ASSITED LAPAROSCOPIC NEPHROURETERECTOMY;  Surgeon: [name], MD;  Location: [facility];  Service: Urology;  Laterality: Right;  . TEE WITHOUT CARDIOVERSION  12/19/2011   Procedure: TRANSESOPHAGEAL ECHOCARDIOGRAM (TEE);  Surgeon: [name], MD;  Location: [facility] ENDOSCOPY;  Service: Cardiovascular;  Laterality: N/A;  no evidence thrombus in the atrial cavity or appendage;  mild AV thickened w/ mild AI;  minimal fixed plaque in thoracic aorta;  mild MV thickened w/ mild MR;  NO pfo by color doppler or with injection of agitated saline  . TOTAL HIP ARTHROPLASTY Left 09/07/2017   Procedure: TOTAL HIP ARTHROPLASTY ANTERIOR APPROACH;  Surgeon: Rod Can, MD;  Location: [facility];  Service: Orthopedics;  Laterality: Left;  . TOTAL HIP ARTHROPLASTY Right 02/01/2018   Procedure: TOTAL HIP  ARTHROPLASTY ANTERIOR APPROACH;  Surgeon: Rod Can, MD;  Location: [facility];  Service: Orthopedics;  Laterality: Right;  . TRANSTHORACIC ECHOCARDIOGRAM  09-10-20103   grade 1 diastolic dysfunction,  ef 55-60%/  AV sclerosis without stenosis/  trivial TR  . WRIST FRACTURE SURGERY Right 2014  approx.   retained hardware    reports that he quit smoking about 53 years ago. His smoking use included cigarettes and pipe. He quit after 5.00 years of use. He has never used smokeless tobacco. He reports that he does not drink alcohol and does not use drugs. family history includes Brain cancer in his father; Dementia in his brother; Heart Problems in his mother; Parkinsonism in his brother. Allergies  Allergen Reactions  . Acyclovir And Related Other (See Comments)    Unsure of what happens   Current Outpatient Medications on File Prior to Visit  Medication Sig Dispense Refill  . B Complex Vitamins (VITAMIN B COMPLEX PO) Take 1 tablet by mouth daily. "high powered"    . camphor-menthol (SARNA) lotion Apply topically as needed for itching. 222 mL 0  . Cholecalciferol (VITAMIN D3) 5000 units CAPS Take 5,000 Units by mouth daily.     [name] ELIQUIS 2.5 MG TABS tablet Take 1 tablet (2.5 mg total) by mouth 2 (two) times daily. 180 tablet 3  . ferrous sulfate 325 (65 FE) MG tablet Take 325 mg by mouth daily with breakfast.    . FLUZONE HIGH-DOSE QUADRIVALENT 0.7 ML SUSY     . nystatin (MYCOSTATIN/NYSTOP) powder SMARTSIG:Sparingly In Ear(s) Twice Daily    . polyethylene glycol powder (GLYCOLAX/MIRALAX) 17 GM/SCOOP powder Take 17 g by mouth 2 (two) times daily as needed. (Patient taking differently: Take 17 g by mouth 2 (two) times daily as needed for moderate constipation. ) 3350 g 1  . senna-docusate (SENOKOT-S) 8.6-50 MG tablet Take 1 tablet by mouth daily as needed for mild constipation.    [name] trimethoprim (TRIMPEX) 100 MG tablet Take 100 mg by mouth daily.     No current facility-administered medications  on file prior to visit.   Review of Systems All otherwise neg per pt     Objective:   Physical Exam BP 130/90 (BP Location: Left Arm, Patient Position: Sitting, Cuff Size: Large)   Pulse 74   Temp 97.9 F (36.6 C) (Oral)   Ht 6\' 1"  (1.854 m)   Wt 200 lb (90.7 kg)   SpO2 97%   BMI 26.39 kg/m  VS noted, severe HOH Constitutional: Pt appears in NAD HENT: Head: NCAT.  Right Ear: External ear normal.  Left Ear: External ear normal.  Eyes: . Pupils are equal, round, and reactive to light. Conjunctivae and EOM are normal Nose: without d/c or deformity Neck: Neck supple. Gross normal ROM Cardiovascular: Normal rate and regular rhythm.   Pulmonary/Chest: Effort normal and breath sounds without rales or wheezing.  Right shoudler NT with abduction to  90 degrees only Left shoulder NT with abduction to 110 degrees Abd:  Soft, NT, ND, + BS, no organomegaly Neurological: Pt is alert. At baseline orientation, motor grossly intact except chronic right foot drop since 01/2019 Skin: Skin is warm. No rashes, other new lesions, no LE edema Psychiatric: Pt behavior is without agitation , somewhat emotionally labile, responds to re-direction All otherwise neg per pt Lab Results  Component Value Date   WBC 4.7 03/15/2020   HGB 15.5 03/15/2020   HCT 45.7 03/15/2020   PLT 155.0 03/15/2020   GLUCOSE 96 03/15/2020   CHOL 157 03/15/2020   TRIG 120.0 03/15/2020   HDL 37.20 (L) 03/15/2020   LDLCALC 95 03/15/2020   ALT 24 03/15/2020   AST 19 03/15/2020   NA 138 03/15/2020   K 4.1 03/15/2020   CL 102 03/15/2020   CREATININE 2.07 (H) 03/15/2020   BUN 29 (H) 03/15/2020   CO2 29 03/15/2020   TSH 2.27 06/24/2019   PSA 0.90 11/03/2017   INR 1.19 01/29/2018   HGBA1C 5.4 03/15/2020       Assessment & Plan:

## 2020-03-24 DIAGNOSIS — N401 Enlarged prostate with lower urinary tract symptoms: Secondary | ICD-10-CM | POA: Diagnosis not present

## 2020-03-24 DIAGNOSIS — R3914 Feeling of incomplete bladder emptying: Secondary | ICD-10-CM | POA: Diagnosis not present

## 2020-03-24 DIAGNOSIS — C641 Malignant neoplasm of right kidney, except renal pelvis: Secondary | ICD-10-CM | POA: Diagnosis not present

## 2020-03-28 DIAGNOSIS — Z79899 Other long term (current) drug therapy: Secondary | ICD-10-CM | POA: Diagnosis not present

## 2020-03-28 DIAGNOSIS — L2089 Other atopic dermatitis: Secondary | ICD-10-CM | POA: Diagnosis not present

## 2020-04-04 ENCOUNTER — Encounter: Payer: Self-pay | Admitting: Internal Medicine

## 2020-04-05 NOTE — Telephone Encounter (Signed)
I saw paperwork I believe on dec 22 in the last hour I was at the office.  I did not notice a deadline to fill out.  I think I left the papers on the desk in front of the printer in my office.  If absolutely necessary, perhaps another provider can fill these out, but this would be very difficult for them I think.  All I can say otherwise is I will be back at the office jan 3 to hopefully get it done then

## 2020-04-09 NOTE — Telephone Encounter (Signed)
Ok forms done signed to [employer]

## 2020-04-10 DIAGNOSIS — R339 Retention of urine, unspecified: Secondary | ICD-10-CM | POA: Diagnosis not present

## 2020-04-17 DIAGNOSIS — L84 Corns and callosities: Secondary | ICD-10-CM | POA: Diagnosis not present

## 2020-04-17 DIAGNOSIS — M79671 Pain in right foot: Secondary | ICD-10-CM | POA: Diagnosis not present

## 2020-04-17 DIAGNOSIS — L602 Onychogryphosis: Secondary | ICD-10-CM | POA: Diagnosis not present

## 2020-04-17 DIAGNOSIS — M79672 Pain in left foot: Secondary | ICD-10-CM | POA: Diagnosis not present

## 2020-05-01 ENCOUNTER — Encounter: Payer: Self-pay | Admitting: Internal Medicine

## 2020-05-04 DIAGNOSIS — N390 Urinary tract infection, site not specified: Secondary | ICD-10-CM | POA: Diagnosis not present

## 2020-05-08 ENCOUNTER — Other Ambulatory Visit: Payer: Self-pay | Admitting: Urology

## 2020-05-09 DIAGNOSIS — N390 Urinary tract infection, site not specified: Secondary | ICD-10-CM | POA: Diagnosis not present

## 2020-05-09 DIAGNOSIS — B961 Klebsiella pneumoniae [K. pneumoniae] as the cause of diseases classified elsewhere: Secondary | ICD-10-CM | POA: Diagnosis not present

## 2020-05-10 ENCOUNTER — Other Ambulatory Visit: Payer: Self-pay

## 2020-05-10 ENCOUNTER — Encounter: Payer: Self-pay | Admitting: Internal Medicine

## 2020-05-10 DIAGNOSIS — E860 Dehydration: Secondary | ICD-10-CM | POA: Diagnosis not present

## 2020-05-10 DIAGNOSIS — N390 Urinary tract infection, site not specified: Secondary | ICD-10-CM | POA: Diagnosis not present

## 2020-05-11 ENCOUNTER — Telehealth: Payer: Self-pay | Admitting: Internal Medicine

## 2020-05-11 ENCOUNTER — Other Ambulatory Visit: Payer: Self-pay

## 2020-05-11 DIAGNOSIS — N390 Urinary tract infection, site not specified: Secondary | ICD-10-CM | POA: Diagnosis not present

## 2020-05-11 DIAGNOSIS — B961 Klebsiella pneumoniae [K. pneumoniae] as the cause of diseases classified elsewhere: Secondary | ICD-10-CM | POA: Diagnosis not present

## 2020-05-11 MED ORDER — TAMSULOSIN HCL 0.4 MG PO CAPS: 0.4000 mg | ORAL_CAPSULE | Freq: Two times a day (BID) | ORAL | 3 refills | Status: DC

## 2020-05-11 NOTE — Telephone Encounter (Signed)
  tamsulosin (FLOMAX) 0.4 MG CAPS capsule [facility] - [place], [place] - [address] Phone:  [phone]  Fax:  [phone]     Last seen- 12.08.21 Next apt- 06.10.22

## 2020-05-15 ENCOUNTER — Encounter: Payer: Self-pay | Admitting: Internal Medicine

## 2020-05-15 DIAGNOSIS — Z8661 Personal history of infections of the central nervous system: Secondary | ICD-10-CM | POA: Diagnosis not present

## 2020-05-15 DIAGNOSIS — Z87440 Personal history of urinary (tract) infections: Secondary | ICD-10-CM | POA: Diagnosis not present

## 2020-05-15 DIAGNOSIS — F0390 Unspecified dementia without behavioral disturbance: Secondary | ICD-10-CM | POA: Diagnosis not present

## 2020-05-15 DIAGNOSIS — E860 Dehydration: Secondary | ICD-10-CM | POA: Diagnosis not present

## 2020-05-15 MED ORDER — PANTOPRAZOLE SODIUM 40 MG PO TBEC: 40.0000 mg | DELAYED_RELEASE_TABLET | Freq: Every day | ORAL | 3 refills | Status: AC

## 2020-05-15 MED ORDER — BACLOFEN 10 MG PO TABS: 10.0000 mg | ORAL_TABLET | Freq: Three times a day (TID) | ORAL | 0 refills | Status: AC

## 2020-05-18 DIAGNOSIS — D631 Anemia in chronic kidney disease: Secondary | ICD-10-CM | POA: Diagnosis not present

## 2020-05-18 DIAGNOSIS — N1832 Chronic kidney disease, stage 3b: Secondary | ICD-10-CM | POA: Diagnosis not present

## 2020-05-18 DIAGNOSIS — N390 Urinary tract infection, site not specified: Secondary | ICD-10-CM | POA: Diagnosis not present

## 2020-05-18 DIAGNOSIS — N189 Chronic kidney disease, unspecified: Secondary | ICD-10-CM | POA: Diagnosis not present

## 2020-05-18 DIAGNOSIS — N183 Chronic kidney disease, stage 3 unspecified: Secondary | ICD-10-CM | POA: Diagnosis not present

## 2020-05-18 DIAGNOSIS — I129 Hypertensive chronic kidney disease with stage 1 through stage 4 chronic kidney disease, or unspecified chronic kidney disease: Secondary | ICD-10-CM | POA: Diagnosis not present

## 2020-05-18 DIAGNOSIS — N2581 Secondary hyperparathyroidism of renal origin: Secondary | ICD-10-CM | POA: Diagnosis not present

## 2020-05-26 ENCOUNTER — Telehealth: Payer: Self-pay | Admitting: Internal Medicine

## 2020-05-26 ENCOUNTER — Telehealth: Payer: Self-pay

## 2020-05-26 NOTE — Telephone Encounter (Signed)
Patient daughter notified to use Vaseline for skin tear

## 2020-05-26 NOTE — Telephone Encounter (Signed)
Well, for example a skin tear on the arm would need only vaseline and a gauze/bandaid to cover until healed; just let me know if she does not this is practical; we can always refer to urology if needed

## 2020-05-26 NOTE — Telephone Encounter (Signed)
Patients daughter called and said that the patient has a skin tear on his penis and is wondering what could be put on it. Please advise. She can be reached at [phone]

## 2020-05-30 ENCOUNTER — Encounter: Payer: Self-pay | Admitting: Internal Medicine

## 2020-05-30 MED ORDER — KETOCONAZOLE 2 % EX CREA: 1.0000 "application " | TOPICAL_CREAM | Freq: Every day | CUTANEOUS | 1 refills | Status: AC

## 2020-05-30 MED ORDER — NYSTATIN 100000 UNIT/GM EX POWD: CUTANEOUS | 1 refills | Status: AC

## 2020-06-07 DIAGNOSIS — R339 Retention of urine, unspecified: Secondary | ICD-10-CM | POA: Diagnosis not present

## 2020-06-14 ENCOUNTER — Encounter: Payer: Self-pay | Admitting: Internal Medicine

## 2020-06-14 DIAGNOSIS — R1310 Dysphagia, unspecified: Secondary | ICD-10-CM

## 2020-06-15 ENCOUNTER — Encounter: Payer: Self-pay | Admitting: Internal Medicine

## 2020-06-16 ENCOUNTER — Other Ambulatory Visit: Payer: Self-pay

## 2020-06-16 ENCOUNTER — Telehealth: Payer: Self-pay | Admitting: Internal Medicine

## 2020-06-16 MED ORDER — TRIAMCINOLONE ACETONIDE 0.1 % EX CREA: 1.0000 "application " | TOPICAL_CREAM | Freq: Two times a day (BID) | CUTANEOUS | 0 refills | Status: AC | PRN

## 2020-06-16 NOTE — Telephone Encounter (Signed)
Patient daughter notified benadryl or clartin is ok

## 2020-06-16 NOTE — Telephone Encounter (Signed)
Okay to try benadryl for itching although this can cause drowsiness. Also okay to try claritin 1 pill up to 3 times a day for itching which is non-drowsy.

## 2020-06-16 NOTE — Telephone Encounter (Signed)
Patients daughter, [name] called and was wondering if the patient could take Benadryl  for itching. She said that he has an appointment with his dermatologist on 06/20/20. Please advise.   She can be reached at [phone]

## 2020-06-19 ENCOUNTER — Ambulatory Visit: Payer: PPO | Admitting: Family

## 2020-06-20 ENCOUNTER — Encounter: Payer: Self-pay | Admitting: Internal Medicine

## 2020-06-20 DIAGNOSIS — L2089 Other atopic dermatitis: Secondary | ICD-10-CM | POA: Diagnosis not present

## 2020-06-20 DIAGNOSIS — Z79899 Other long term (current) drug therapy: Secondary | ICD-10-CM | POA: Diagnosis not present

## 2020-07-08 DIAGNOSIS — R339 Retention of urine, unspecified: Secondary | ICD-10-CM | POA: Diagnosis not present

## 2020-07-13 DIAGNOSIS — N390 Urinary tract infection, site not specified: Secondary | ICD-10-CM | POA: Diagnosis not present

## 2020-07-13 DIAGNOSIS — R8290 Unspecified abnormal findings in urine: Secondary | ICD-10-CM | POA: Diagnosis not present

## 2020-07-18 ENCOUNTER — Encounter: Payer: Self-pay | Admitting: Internal Medicine

## 2020-07-18 ENCOUNTER — Telehealth: Payer: Self-pay | Admitting: Internal Medicine

## 2020-07-18 MED ORDER — ELIQUIS 2.5 MG PO TABS: 2.5000 mg | ORAL_TABLET | Freq: Two times a day (BID) | ORAL | 3 refills | Status: DC

## 2020-07-18 NOTE — Progress Notes (Unsigned)
Called and spoke with daughter regarding anticoagulation.    Recent UTI now chronic,   Prompted by palliative care RN.    Three issues 1) risk of falls 2) no apparent atrial fibrillation based on home health assessments 3) consequences of stroke in a patient who is progressively demented and DNR  As relates to the first, the risk of falls that would preclude anticoagulation with Eliquis was recently reported to be [place] of 250/year  The absence of atrial fibrillation in the affirm trial with a subsequent discontinuation of anticoagulation with a major contributor to the high stroke rate in this group; hence, the absence of clinical atrial fibrillation on examination does not identify a low risk group  The mortality of a stroke with atrial fibrillation is estimated to be about 30%.  Debility of strokes related to if atrial fibrillation are greater than others, hence, it is more likely than not that a stroke would be more disabling than it would be fatal. Based on the above my recommendation would be not to stop the anticoagulation.

## 2020-07-18 NOTE — Telephone Encounter (Signed)
Dr [name] has spoken with pt's daughter and have decided not to discontinue Eliquis at this time.  Refill sent to pharmacy on file as requested.

## 2020-07-18 NOTE — Telephone Encounter (Signed)
Pt c/o medication issue:  1. Name of Medication: ELIQUIS 2.5 MG TABS tablet  2. How are you currently taking this medication (dosage and times per day)? As directed   3. Are you having a reaction (difficulty breathing--STAT)?   4. What is your medication issue? [name], Patient's daughter and POA wanted to talk to Dr. [name] or his Nurse with regards to possibly cutting back this medication or cutting it out.  The patient is on Palliative Care. The  Daughter has spoken with the Palliative Care nurse about the medication. The patient was originally put on this medication after having a stroke in 2013, which could have been from undiagnosed afib.  The patient's HR has been stable since his last OV with Dr. [name]   The patient is a fall risk, and while the patient has not fallen, they would like to reduce risks for falls.   Please let the patient know what Dr. [name] recommends

## 2020-07-18 NOTE — Telephone Encounter (Signed)
Stopping anticoagulation on patient with hx of stroke requires MD input.   Will wait for Dr Olin Pia recommendations.

## 2020-07-20 DIAGNOSIS — Z87440 Personal history of urinary (tract) infections: Secondary | ICD-10-CM | POA: Diagnosis not present

## 2020-08-01 ENCOUNTER — Telehealth: Payer: Self-pay | Admitting: Internal Medicine

## 2020-08-01 NOTE — Telephone Encounter (Signed)
Tammy from [facility] has called stating the patient is stable. She will be discharging him from the program. She will fax over all discharge papers.  Tammy: [phone]

## 2020-08-02 ENCOUNTER — Telehealth: Payer: Self-pay

## 2020-08-02 NOTE — Telephone Encounter (Signed)
Spoke with patient's daughter [name] and scheduled an in-person Palliative Consult for 08/18/20 @ 9:30AM  COVID screening was negative. One cat in the home. Patient lives with wife and daughter is staying there currently.  Consent obtained; updated Outlook/Netsmart/Team List and Epic.  Family is aware they may be receiving a call from NP the day before or day of to confirm appointment.

## 2020-08-03 ENCOUNTER — Encounter: Payer: Self-pay | Admitting: Internal Medicine

## 2020-08-04 NOTE — Telephone Encounter (Signed)
Ok to continue same advice as given and tx as already doing - no change

## 2020-08-06 DIAGNOSIS — R339 Retention of urine, unspecified: Secondary | ICD-10-CM | POA: Diagnosis not present

## 2020-08-18 ENCOUNTER — Other Ambulatory Visit: Payer: Self-pay

## 2020-08-18 ENCOUNTER — Other Ambulatory Visit: Payer: PPO | Admitting: Student

## 2020-08-21 ENCOUNTER — Other Ambulatory Visit: Payer: Self-pay

## 2020-08-21 ENCOUNTER — Other Ambulatory Visit: Payer: PPO | Admitting: Student

## 2020-08-21 DIAGNOSIS — F0150 Vascular dementia without behavioral disturbance: Secondary | ICD-10-CM | POA: Diagnosis not present

## 2020-08-21 DIAGNOSIS — Z515 Encounter for palliative care: Secondary | ICD-10-CM

## 2020-08-21 DIAGNOSIS — Z8673 Personal history of transient ischemic attack (TIA), and cerebral infarction without residual deficits: Secondary | ICD-10-CM | POA: Diagnosis not present

## 2020-08-21 DIAGNOSIS — N390 Urinary tract infection, site not specified: Secondary | ICD-10-CM

## 2020-08-21 DIAGNOSIS — I672 Cerebral atherosclerosis: Secondary | ICD-10-CM | POA: Diagnosis not present

## 2020-08-21 NOTE — Progress Notes (Signed)
[facility] Palliative Care Consult Note Telephone: [phone]  Fax: [phone]    Date of encounter: 08/21/20 PATIENT NAME: Jeffrey Hardy [address] [place] [address]   [phone] (home)  DOB: 02/07/1936 MRN: [id number] PRIMARY CARE PROVIDER:    Biagio Borg, MD,  [place] [id number] [phone]  REFERRING PROVIDER:   Biagio Borg, MD [address] [place],  [place] [address] [phone]  RESPONSIBLE PARTY:    Contact Information    Name Relation Home Work Mobile   Jeffrey Hardy Spouse [phone]  [phone]   Jeffrey Hardy Daughter   [phone]       I met face to face with patient and family in the home. Palliative Care was asked to follow this patient by consultation request of  Jeffrey Borg, MD to address advance care planning and complex medical decision making. This is the initial visit.                                     ASSESSMENT AND PLAN / RECOMMENDATIONS:   Advance Care Planning/Goals of Care: Goals include to maximize quality of life and symptom management. To maintain current level of care. Wife would like for patient to remain in the home for now, at least until he starts to decline. Our advance care planning conversation included a discussion about:     The value and importance of advance care planning   Experiences with loved ones who have been seriously ill or have died   Exploration of personal, cultural or spiritual beliefs that might influence medical decisions   Exploration of goals of care in the event of a sudden injury or illness   Identification and preparation of a healthcare agent   Introduced MOST form; family to review and will complete on next visit.  CODE STATUS: DNR  Education provided on Palliative Medicine vs. Hospice services. Ongoing evaluation for changes and declines. Will refer to hospice when he meets criteria for hospice.  Symptom  Management/Plan:  Cerebrovascular disease, post stroke, Vascular dementia-Patient requires assistance with adl's. Requires redirection/reorientation. Recommend family and private caregivers to assist with adl's, reorient/redirect as needed. Continue stimulating activities that patient enjoys/can engage such as puzzles. Recommend walker for ambulation; monitor for falls/safety. Follow up with Neurology as scheduled.  Recurrent UTI's-no recent UTI's. Monitor for s/s of urinary tract infections. Continue trimethoprim daily for prophylaxis. Continue Flomax as directed. Follow up with Urology as scheduled.  Palliative Medicine SW will provide recommendations for caregiver support.   Follow up Palliative Care Visit: Palliative care will continue to follow for complex medical decision making, advance care planning, and clarification of goals. Return in 6 weeks or prn.  I spent 60 minutes providing this consultation. More than 50% of the time in this consultation was spent in counseling and care coordination.   PPS: 50%  HOSPICE ELIGIBILITY/DIAGNOSIS: TBD  Chief Complaint: Palliative Medicine initial consult; cerebrovascular disease.  HISTORY OF PRESENT ILLNESS:  Jeffrey Hardy is a 85 y.o. year old male  with cerebrovascular disease, post stroke 2013, dementia. Dx also include CKD 3, hypertension, hperlipidemia.   Patient presently resides at home with his wife; his daughter Jeffrey Hardy has also been staying to help care for patient. Patient was recently discharged from another Palliative program due not "actively declining." Patient currently has [facility] 5 days a week, 9am-1pm. Working on getting  coverage on Wednesdays & Saturdays. Patient denies pain, shortness of breath. Daughter reports some dyspnea with exertion, but rebounds after short period of time. Good appetite endorsed; no weight loss. Daughter states they have had to go up one size. Sometimes "get choked" with thin liquids; no difficulty with  solids. Hx of recurrent UTI's. Last UTI in January; on trimethoprim daily. No recent falls; last fall was in December. Reports sleeping well. No recent ER visits or hospitalizations. Family does express some caregiver fatigue. Patient has been on waiting list at [facility] When patient was notified of possible admission about 6 months ago; wife declined as she felt he was not ready go at the time.   History obtained from review of EMR, discussion with primary team, and interview with family, facility staff/caregiver and/or Jeffrey Hardy.  I reviewed available labs, medications, imaging, studies and related documents from the EMR.  Records reviewed and summarized above.   ROS  General: NAD EYES: denies vision changes ENMT: occasional "choking" with thin liquids Cardiovascular: denies chest pain Pulmonary: denies cough, SOB w/exertion Abdomen: endorses good appetite, denies constipation, endorses continence of bowel GU: denies dysuria, condom cath at night; urinal during the day MSK: weakness, recent falls reported Skin: denies rashes or wounds Neurological: denies pain, denies insomnia Psych: Endorses positive mood Heme/lymph/immuno: denies bruises, abnormal bleeding  Physical Exam: Pulse 80, resp 20, b/p 120/78, sats 97% on room air Constitutional: NAD General: WNWD EYES: anicteric sclera, lids intact, no discharge  ENMT: intact hearing, oral mucous membranes moist, dentition intact CV: S1S2, RRR, 1+ LE edema Pulmonary: LCTA, no increased work of breathing, no cough Abdomen: normo-active BS + 4 quadrants, soft and non tender GU: deferred MSK: no sarcopenia, moves all extremities, ambulatory Skin: warm and dry, no rashes or wounds on visible skin Neuro: generalized weakness, grips equal bilaterally Psych: non-anxious affect, A & O x 2 Hem/lymph/immuno: no widespread bruising   CURRENT PROBLEM LIST:  Patient Active Problem List   Diagnosis Date Noted  . UTI (urinary tract infection)  09/24/2019  . Acute metabolic encephalopathy 16/60/6301  . Dementia with behavioral disturbance ([place]) 09/24/2019  . Sepsis ([place]) 09/24/2019  . Orthostatic hypotension 09/06/2019  . Bradycardia 09/06/2019  . Bigeminy 07/01/2019  . Paresthesias 05/03/2019  . Vomiting 05/03/2019  . Falls frequently 08/28/2018  . Gait disorder 08/24/2018  . Insomnia 08/24/2018  . Peripheral neuropathy 05/04/2018  . Syncope 02/06/2018  . Closed displaced fracture of right femoral neck ([place]) 02/01/2018  . PAF (paroxysmal atrial fibrillation) ([place]) 01/30/2018  . Closed right hip fracture, initial encounter ([place]) 01/29/2018  . CKD (chronic kidney disease) stage 3, GFR 30-59 ml/min (HCC) 09/04/2017  . Hip fracture ([place]) 09/04/2017  . Renal cell cancer ([place]) 09/06/2016  . Urinary frequency 03/08/2016  . Dementia without behavioral disturbance ([place]) 03/08/2016  . History of colonic polyps 07/07/2015  . Hyperglycemia 07/04/2015  . Cerebrovascular disease, arteriosclerotic, post-stroke 12/28/2013  . Shingles 06/29/2013  . Incontinent of urine 12/29/2012  . Thrombocytopenia ([place]) 01/13/2012  . 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 Preventative health care 07/27/2010  . Hyperlipidemia 05/12/2008  . Essential hypertension 05/12/2008  . GERD 05/12/2008  . DIVERTICULOSIS, COLON 05/12/2008   PAST MEDICAL HISTORY:  Active Ambulatory Problems    Diagnosis Date Noted  . Hyperlipidemia 05/12/2008  . Essential hypertension 05/12/2008  . GERD 05/12/2008  . DIVERTICULOSIS, COLON 05/12/2008  . Preventative health care 07/27/2010  . 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 Thrombocytopenia ([place]) 01/13/2012  . Incontinent of urine 12/29/2012  .  Shingles 06/29/2013  . Cerebrovascular disease, arteriosclerotic, post-stroke 12/28/2013  . Hyperglycemia 07/04/2015  . History of colonic polyps 07/07/2015  . Urinary frequency 03/08/2016  . Dementia without behavioral disturbance ([place]) 03/08/2016  . Renal cell cancer ([place]) 09/06/2016  .  CKD (chronic kidney disease) stage 3, GFR 30-59 ml/min (HCC) 09/04/2017  . Hip fracture ([place]) 09/04/2017  . Closed right hip fracture, initial encounter ([place]) 01/29/2018  . PAF (paroxysmal atrial fibrillation) ([place]) 01/30/2018  . Closed displaced fracture of right femoral neck ([place]) 02/01/2018  . Syncope 02/06/2018  . Peripheral neuropathy 05/04/2018  . Gait disorder 08/24/2018  . Insomnia 08/24/2018  . Falls frequently 08/28/2018  . Paresthesias 05/03/2019  . Vomiting 05/03/2019  . Bigeminy 07/01/2019  . Orthostatic hypotension 09/06/2019  . Bradycardia 09/06/2019  . UTI (urinary tract infection) 09/24/2019  . Acute metabolic encephalopathy 98/33/8250  . Dementia with behavioral disturbance ([place]) 09/24/2019  . Sepsis ([place]) 09/24/2019   Resolved Ambulatory Problems    Diagnosis Date Noted  . ANXIETY 05/12/2008  . DEPRESSION 05/12/2008  . PEPTIC ULCER DISEASE 05/12/2008  . OVERACTIVE BLADDER 05/12/2008  . Dizziness 07/27/2010  . History of stroke 12/18/2011  . Elevated blood pressure (not hypertension) 12/19/2011  . Syncope 07/14/2012  . Colles' fracture of right radius 12/14/2012  . AKI (acute kidney injury) ([place]) 07/04/2015  . Renal mass 05/13/2016  . CKD (chronic kidney disease) 09/06/2016  . Closed left hip fracture, initial encounter ([place]) 09/04/2017  . Closed displaced fracture of left femoral neck ([place]) 09/07/2017  . Parkinsonism ([place]) 06/09/2018  . 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 Bradycardia on ECG 07/01/2019   Past Medical History:  Diagnosis Date  . Anxiety   . Bilateral renal cysts   . Cancer ([place])   . Dehydration 08/2018  . Depression   . Diverticulosis of colon   . Erectile dysfunction   . Hematuria   . History of cerebral embolic infarction x2 CVA's ---  neurologist-  [name]--- per pt residual abnormal gait , does need cane  . History of duodenal ulcer   . History of Helicobacter pylori infection   . History of hiatal hernia   . History  of loop recorder   . History of syncope   . Hypertension   . Nocturia   . Right renal mass   . Status post placement of implantable loop recorder   . Stroke ([place])   . Urgency of urination   . Wears hearing aid    SOCIAL HX:  Social History   Tobacco Use  . Smoking status: Former Smoker    Years: 5.00    Types: Cigarettes, Pipe    Quit date: 09/07/1966    Years since quitting: 53.9  . Smokeless tobacco: Never Used  Substance Use Topics  . Alcohol use: No    Alcohol/week: 0.0 standard drinks   FAMILY HX:  Family History  Problem Relation Age of Onset  . Heart Problems Mother   . Brain cancer Father   . Parkinsonism Brother   . Dementia Brother   . Colon cancer Neg Hx   . Esophageal cancer Neg Hx   . Stomach cancer Neg Hx   . Gastric cancer Neg Hx   . Liver disease Neg Hx   . Kidney disease Neg Hx   . Diabetes Neg Hx       ALLERGIES:  Allergies  Allergen Reactions  . Acyclovir And Related Other (See Comments)    Unsure of what happens  PERTINENT MEDICATIONS:  Outpatient Encounter Medications as of 08/21/2020  Medication Sig  . B Complex Vitamins (VITAMIN B COMPLEX PO) Take 1 tablet by mouth daily. "high powered"  . Cholecalciferol (VITAMIN D3) 5000 units CAPS Take 5,000 Units by mouth daily.   [name] ELIQUIS 2.5 MG TABS tablet Take 1 tablet (2.5 mg total) by mouth 2 (two) times daily.  . pantoprazole (PROTONIX) 40 MG tablet Take 1 tablet (40 mg total) by mouth daily.  . tamsulosin (FLOMAX) 0.4 MG CAPS capsule TAKE ONE (1) CAPSULE BY MOUTH 2 TIMES DAILY  . trimethoprim (TRIMPEX) 100 MG tablet Take 100 mg by mouth daily.  . baclofen (LIORESAL) 10 MG tablet Take 1 tablet (10 mg total) by mouth 3 (three) times daily. (Patient taking differently: Take 10 mg by mouth 3 (three) times daily as needed.)  . camphor-menthol (SARNA) lotion Apply topically as needed for itching.  . ferrous sulfate 325 (65 FE) MG tablet Take 325 mg by mouth daily with breakfast.  . FLUZONE  HIGH-DOSE QUADRIVALENT 0.7 ML SUSY   . ketoconazole (NIZORAL) 2 % cream Apply 1 application topically daily.  [name] nystatin (MYCOSTATIN/NYSTOP) powder Use as directed twice daily as needed to affected area  . polyethylene glycol powder (GLYCOLAX/MIRALAX) 17 GM/SCOOP powder Take 17 g by mouth 2 (two) times daily as needed. (Patient taking differently: Take 17 g by mouth 2 (two) times daily as needed for moderate constipation. )  . senna-docusate (SENOKOT-S) 8.6-50 MG tablet Take 1 tablet by mouth daily as needed for mild constipation.  . triamcinolone (KENALOG) 0.1 % Apply 1 application topically 2 (two) times daily as needed.   No facility-administered encounter medications on file as of 08/21/2020.    Thank you for the opportunity to participate in the care of Mr. Erekson.  The palliative care team will continue to follow. Please call our office at [phone] if we can be of additional assistance.   Ezekiel Slocumb, NP   COVID-19 PATIENT SCREENING TOOL Asked and negative response unless otherwise noted:   Have you had symptoms of covid, tested positive or been in contact with someone with symptoms/positive test in the past 5-10 days? No

## 2020-08-25 DIAGNOSIS — M79671 Pain in right foot: Secondary | ICD-10-CM | POA: Diagnosis not present

## 2020-08-25 DIAGNOSIS — M79672 Pain in left foot: Secondary | ICD-10-CM | POA: Diagnosis not present

## 2020-08-25 DIAGNOSIS — L84 Corns and callosities: Secondary | ICD-10-CM | POA: Diagnosis not present

## 2020-08-25 DIAGNOSIS — L602 Onychogryphosis: Secondary | ICD-10-CM | POA: Diagnosis not present

## 2020-09-06 ENCOUNTER — Telehealth: Payer: Self-pay | Admitting: *Deleted

## 2020-09-06 NOTE — Telephone Encounter (Signed)
Called and spoke with patient's daughter, [name], to schedule a palliative care RN visit. Visit scheduled for 6/2@10a .

## 2020-09-07 ENCOUNTER — Other Ambulatory Visit: Payer: PPO | Admitting: *Deleted

## 2020-09-07 ENCOUNTER — Other Ambulatory Visit: Payer: Self-pay

## 2020-09-07 VITALS — BP 124/70 | HR 69 | Temp 97.9°F | Resp 17

## 2020-09-07 DIAGNOSIS — R339 Retention of urine, unspecified: Secondary | ICD-10-CM | POA: Diagnosis not present

## 2020-09-07 DIAGNOSIS — Z515 Encounter for palliative care: Secondary | ICD-10-CM

## 2020-09-08 ENCOUNTER — Encounter: Payer: Self-pay | Admitting: Internal Medicine

## 2020-09-10 ENCOUNTER — Encounter: Payer: Self-pay | Admitting: Internal Medicine

## 2020-09-10 DIAGNOSIS — N390 Urinary tract infection, site not specified: Secondary | ICD-10-CM | POA: Diagnosis not present

## 2020-09-11 NOTE — Progress Notes (Signed)
COMMUNITY PALLIATIVE CARE RN NOTE  PATIENT NAME: KATELYN KOHLMEYER DOB: [birth year] MRN: [id number]  PRIMARY CARE PROVIDER: Biagio Borg, MD  RESPONSIBLE PARTY: Mancel Parsons (daughter) Acct ID - Guarantor Home Phone Work Phone Relationship Acct Type  [id number] ARMAN, LOY [phone]  Self P/F     [address], [place], [place] [address]   Covid-19 Pre-screening Negative  PLAN OF CARE and INTERVENTION:  1. ADVANCE CARE PLANNING/GOALS OF CARE: Goal is for patient to remain in his home for as long as physically possible. He has a DNR. 2. PATIENT/CAREGIVER EDUCATION: Symptom management, safe mobility/transfers, s/s of infection 3. DISEASE STATUS: Face-to-face visit made to patient per daughter's request. She reports that patient has become progressively weaker over the past few weeks. This morning, he was only able to take about 4 steps using his rollator walker before his left leg started giving out. They were able to get him to a bedside commode. This is the first morning that he has been unable to take a shower due to weakness, so they had to give him a sponge bath instead. They are also having to utilize a gait belt for safety. No recent falls. About 3 weeks ago, patient was able to stand without assistance and ambulate with his walker. During visit today, he required 2-3 person assistance with standing up from his chair. He was then able to walk about 20-30 ft to his recliner with gait belt and 1 person assistance, but at a very slow pace. He also requires assistance with bathing, toileting and dressing. He is able to put his shirt on and feed himself. It takes patient 30 min- 1 hour just to get up, sit on side of the bed, then stand up to start his day. He does become short of breath with any exertion. Upon arrival, he is sitting up in a chair at the dining table. He was awake and alert and able to answer questions and make his needs known. He is confused at times and requires frequent prompting.  He is also more forgetful and remembering how to do basic things such as use his urinal, and the need to pull down his underwear in order to do so. He speaks greater than 6 intelligible words. He is falling asleep on and off throughout visit. He is sleeping more overall, getting up later in the morning and going to bed earlier in the evening. His wife reports his appetite has slowed down for breakfast and he has not been as hungry overall. He does get strangled on thin liquids. His meats are cut up. He does eat fast and has to be reminded to slow down. He has a history of aspiration pneumonia. In the past 3 days, he has started chewing his pills vs swallowing them whole. Family states he weighs around 200 lbs and they have not noticed any recent wt loss. He has Ensure available for nutritional supplementation but rarely drinks these. Daughter feels that his abdomen appears more distended. He has good bowel sounds in all 4 quadrants. His last bowel movement was yesterday. He has some intermittent urinary incontinence and wears a condom catheter during the night. He has Depends available if needed. He has lessened urinary output, but is also not drinking as much. He has no skin breakdown or open areas. Daughter has noticed a slight bruise on his right side. He does experience some itching usually on the right side of his body. This has improved since the addition of  Gabapentin and steroid cream. He continues with a hired caregiver through [facility] from 9a-1p. They have increased this service to 7 days/week. Family is looking into respite for patient due to caregiver role strain. I sent an email to daughter, per information provided by palliative MSW, for facilities offering respite along with caregiver support groups in the area. Patient visit update given to palliative care NP. Patient has an upcoming appointments: 6/9 with Dr. Zigmund Daniel with [facility], 6/10 with PCP and 6/16 Urology.   HISTORY OF PRESENT ILLNESS: This  is a 85 yo male with a diagnosis of Dementia without behavioral disturbances. Palliative care team continues to follow patient for symptom management and additional support.    CODE STATUS: DNR  ADVANCED DIRECTIVES: Y MOST FORM: no (MOST form completed, awaiting NP signature) PPS: 30%   PHYSICAL EXAM:   VITALS: Today's Vitals   09/07/20 1031  BP: 124/70  Pulse: 69  Resp: 17  Temp: 97.9 F (36.6 C)  TempSrc: Temporal  SpO2: 97%  PainSc: 0-No pain    LUNGS: clear to auscultation  CARDIAC: Cor irreg EXTREMITIES: Trace bilateral lower extremity edema, L>R SKIN: No skin issues; no open areas  NEURO: Alert and oriented x person/place, some confusion, increased generalized weakness, ambulatory w/rollator and 1 person assist   (Duration of visit and documentation 90 minutes)   Daryl Eastern, RN BSN

## 2020-09-11 NOTE — Telephone Encounter (Signed)
[place] for verbal - ok for me to be attending but also ok for hopsice MD for symptomatica meds

## 2020-09-14 DIAGNOSIS — G214 Vascular parkinsonism: Secondary | ICD-10-CM | POA: Diagnosis not present

## 2020-09-14 DIAGNOSIS — I739 Peripheral vascular disease, unspecified: Secondary | ICD-10-CM | POA: Diagnosis not present

## 2020-09-14 DIAGNOSIS — Z8551 Personal history of malignant neoplasm of bladder: Secondary | ICD-10-CM | POA: Diagnosis not present

## 2020-09-14 DIAGNOSIS — Z906 Acquired absence of other parts of urinary tract: Secondary | ICD-10-CM | POA: Diagnosis not present

## 2020-09-14 DIAGNOSIS — N390 Urinary tract infection, site not specified: Secondary | ICD-10-CM | POA: Diagnosis not present

## 2020-09-14 DIAGNOSIS — Z905 Acquired absence of kidney: Secondary | ICD-10-CM | POA: Diagnosis not present

## 2020-09-14 DIAGNOSIS — G319 Degenerative disease of nervous system, unspecified: Secondary | ICD-10-CM | POA: Diagnosis not present

## 2020-09-15 ENCOUNTER — Telehealth: Payer: Self-pay

## 2020-09-15 ENCOUNTER — Ambulatory Visit: Payer: PPO | Admitting: Internal Medicine

## 2020-09-15 NOTE — Telephone Encounter (Signed)
Verbals for Hospice given to [facility]

## 2020-10-05 ENCOUNTER — Encounter: Payer: Self-pay | Admitting: Internal Medicine

## 2020-10-05 NOTE — Telephone Encounter (Signed)
Ok last lab work done Forensic psychologist to [employer]

## 2020-10-07 ENCOUNTER — Telehealth: Payer: Self-pay | Admitting: Student

## 2020-10-07 DIAGNOSIS — I480 Paroxysmal atrial fibrillation: Secondary | ICD-10-CM

## 2020-10-10 DIAGNOSIS — I10 Essential (primary) hypertension: Secondary | ICD-10-CM | POA: Diagnosis not present

## 2020-10-10 DIAGNOSIS — F0390 Unspecified dementia without behavioral disturbance: Secondary | ICD-10-CM | POA: Diagnosis not present

## 2020-10-10 DIAGNOSIS — D649 Anemia, unspecified: Secondary | ICD-10-CM | POA: Diagnosis not present

## 2020-10-10 DIAGNOSIS — I6310 Cerebral infarction due to embolism of unspecified precerebral artery: Secondary | ICD-10-CM | POA: Diagnosis not present

## 2020-10-10 NOTE — Telephone Encounter (Signed)
Prescription refill request for Eliquis received. Indication: a fib Last office visit: 09/07/19 Scr: 2.28 Age:  85  Weight:  90kg  Patient overdue to be seen.  Last visit 09/07/19. Will send one month and send to scheduling.

## 2020-10-11 NOTE — Telephone Encounter (Signed)
Spoke to the daughter and she stated that Jeffrey Hardy is under hospice care now at [facility] as of last week and she did not want to schedule an appt.

## 2020-11-20 DIAGNOSIS — M79674 Pain in right toe(s): Secondary | ICD-10-CM | POA: Diagnosis not present

## 2020-11-20 DIAGNOSIS — M79675 Pain in left toe(s): Secondary | ICD-10-CM | POA: Diagnosis not present

## 2020-11-20 DIAGNOSIS — B351 Tinea unguium: Secondary | ICD-10-CM | POA: Diagnosis not present

## 2020-11-21 DIAGNOSIS — F0150 Vascular dementia without behavioral disturbance: Secondary | ICD-10-CM | POA: Diagnosis not present

## 2020-11-21 DIAGNOSIS — R2681 Unsteadiness on feet: Secondary | ICD-10-CM | POA: Diagnosis not present

## 2020-11-21 DIAGNOSIS — I69318 Other symptoms and signs involving cognitive functions following cerebral infarction: Secondary | ICD-10-CM | POA: Diagnosis not present

## 2020-11-21 DIAGNOSIS — M6281 Muscle weakness (generalized): Secondary | ICD-10-CM | POA: Diagnosis not present

## 2020-11-22 ENCOUNTER — Telehealth: Payer: Self-pay | Admitting: Lab

## 2020-11-22 NOTE — Progress Notes (Signed)
  Chronic Care Management   Outreach Note  11/22/2020 Name: JARREAU DIEDRICK MRN: [id number] DOB: 10/20/1935  Referred by: Biagio Borg, MD Reason for referral : Medication Management   A second unsuccessful telephone outreach was attempted today. The patient was referred to pharmacist for assistance with care management and care coordination.  Follow Up Plan:   [facility]

## 2020-11-24 DIAGNOSIS — D649 Anemia, unspecified: Secondary | ICD-10-CM | POA: Diagnosis not present

## 2020-11-24 DIAGNOSIS — F0390 Unspecified dementia without behavioral disturbance: Secondary | ICD-10-CM | POA: Diagnosis not present

## 2020-11-24 DIAGNOSIS — I10 Essential (primary) hypertension: Secondary | ICD-10-CM | POA: Diagnosis not present

## 2020-11-24 DIAGNOSIS — I6310 Cerebral infarction due to embolism of unspecified precerebral artery: Secondary | ICD-10-CM | POA: Diagnosis not present

## 2020-12-05 DIAGNOSIS — D649 Anemia, unspecified: Secondary | ICD-10-CM | POA: Diagnosis not present

## 2020-12-05 DIAGNOSIS — F0390 Unspecified dementia without behavioral disturbance: Secondary | ICD-10-CM | POA: Diagnosis not present

## 2020-12-05 DIAGNOSIS — I10 Essential (primary) hypertension: Secondary | ICD-10-CM | POA: Diagnosis not present

## 2020-12-05 DIAGNOSIS — I6310 Cerebral infarction due to embolism of unspecified precerebral artery: Secondary | ICD-10-CM | POA: Diagnosis not present

## 2020-12-05 DIAGNOSIS — R5383 Other fatigue: Secondary | ICD-10-CM | POA: Diagnosis not present

## 2021-01-06 DEATH — deceased
